# Patient Record
Sex: Female | Born: 1940 | Race: White | Hispanic: No | Smoking: Never smoker
Health system: Southern US, Community
[De-identification: ages and names within clinical notes are randomized; demographics above are authoritative.]

## PROBLEM LIST (undated history)

## (undated) ENCOUNTER — Emergency Department (HOSPITAL_COMMUNITY): Admission: EM | Source: Home / Self Care

## (undated) DIAGNOSIS — E785 Hyperlipidemia, unspecified: Secondary | ICD-10-CM

## (undated) DIAGNOSIS — G8929 Other chronic pain: Secondary | ICD-10-CM

## (undated) DIAGNOSIS — R519 Headache, unspecified: Secondary | ICD-10-CM

## (undated) DIAGNOSIS — Z9889 Other specified postprocedural states: Secondary | ICD-10-CM

## (undated) DIAGNOSIS — I2510 Atherosclerotic heart disease of native coronary artery without angina pectoris: Secondary | ICD-10-CM

## (undated) DIAGNOSIS — N390 Urinary tract infection, site not specified: Secondary | ICD-10-CM

## (undated) DIAGNOSIS — F32A Depression, unspecified: Secondary | ICD-10-CM

## (undated) DIAGNOSIS — Z87442 Personal history of urinary calculi: Secondary | ICD-10-CM

## (undated) DIAGNOSIS — M21372 Foot drop, left foot: Secondary | ICD-10-CM

## (undated) DIAGNOSIS — F0390 Unspecified dementia without behavioral disturbance: Secondary | ICD-10-CM

## (undated) DIAGNOSIS — R112 Nausea with vomiting, unspecified: Secondary | ICD-10-CM

## (undated) DIAGNOSIS — I10 Essential (primary) hypertension: Secondary | ICD-10-CM

## (undated) DIAGNOSIS — M549 Dorsalgia, unspecified: Secondary | ICD-10-CM

## (undated) DIAGNOSIS — K219 Gastro-esophageal reflux disease without esophagitis: Secondary | ICD-10-CM

## (undated) DIAGNOSIS — E079 Disorder of thyroid, unspecified: Secondary | ICD-10-CM

## (undated) DIAGNOSIS — R42 Dizziness and giddiness: Secondary | ICD-10-CM

## (undated) DIAGNOSIS — R51 Headache: Secondary | ICD-10-CM

## (undated) DIAGNOSIS — M1990 Unspecified osteoarthritis, unspecified site: Secondary | ICD-10-CM

## (undated) DIAGNOSIS — M4800 Spinal stenosis, site unspecified: Secondary | ICD-10-CM

## (undated) DIAGNOSIS — F329 Major depressive disorder, single episode, unspecified: Secondary | ICD-10-CM

## (undated) DIAGNOSIS — N189 Chronic kidney disease, unspecified: Secondary | ICD-10-CM

## (undated) HISTORY — DX: Dorsalgia, unspecified: M54.9

## (undated) HISTORY — DX: Depression, unspecified: F32.A

## (undated) HISTORY — PX: CATARACT EXTRACTION: SUR2

## (undated) HISTORY — PX: BACK SURGERY: SHX140

## (undated) HISTORY — DX: Dizziness and giddiness: R42

## (undated) HISTORY — PX: CYSTOSCOPY: SUR368

## (undated) HISTORY — DX: Headache: R51

## (undated) HISTORY — DX: Chronic kidney disease, unspecified: N18.9

## (undated) HISTORY — PX: COLONOSCOPY: SHX174

## (undated) HISTORY — DX: Disorder of thyroid, unspecified: E07.9

## (undated) HISTORY — DX: Atherosclerotic heart disease of native coronary artery without angina pectoris: I25.10

## (undated) HISTORY — DX: Gastro-esophageal reflux disease without esophagitis: K21.9

## (undated) HISTORY — DX: Essential (primary) hypertension: I10

## (undated) HISTORY — PX: LUMBAR LAMINECTOMY/DECOMPRESSION MICRODISCECTOMY: SHX5026

## (undated) HISTORY — DX: Hyperlipidemia, unspecified: E78.5

## (undated) HISTORY — DX: Major depressive disorder, single episode, unspecified: F32.9

## (undated) HISTORY — DX: Spinal stenosis, site unspecified: M48.00

## (undated) HISTORY — DX: Headache, unspecified: R51.9

## (undated) HISTORY — DX: Other chronic pain: G89.29

---

## 1998-10-09 ENCOUNTER — Other Ambulatory Visit: Admission: RE | Admit: 1998-10-09 | Discharge: 1998-10-09 | Payer: Self-pay | Admitting: Obstetrics and Gynecology

## 2000-01-08 ENCOUNTER — Encounter: Admission: RE | Admit: 2000-01-08 | Discharge: 2000-01-08 | Payer: Self-pay | Admitting: Obstetrics and Gynecology

## 2000-01-08 ENCOUNTER — Encounter: Payer: Self-pay | Admitting: Obstetrics and Gynecology

## 2000-02-20 ENCOUNTER — Other Ambulatory Visit: Admission: RE | Admit: 2000-02-20 | Discharge: 2000-02-20 | Payer: Self-pay | Admitting: Obstetrics and Gynecology

## 2000-02-24 ENCOUNTER — Encounter: Admission: RE | Admit: 2000-02-24 | Discharge: 2000-02-24 | Payer: Self-pay | Admitting: Obstetrics and Gynecology

## 2000-02-24 ENCOUNTER — Encounter: Payer: Self-pay | Admitting: Obstetrics and Gynecology

## 2001-05-12 ENCOUNTER — Other Ambulatory Visit: Admission: RE | Admit: 2001-05-12 | Discharge: 2001-05-12 | Payer: Self-pay | Admitting: Obstetrics and Gynecology

## 2001-05-20 ENCOUNTER — Encounter: Admission: RE | Admit: 2001-05-20 | Discharge: 2001-05-20 | Payer: Self-pay | Admitting: Obstetrics and Gynecology

## 2001-05-20 ENCOUNTER — Encounter: Payer: Self-pay | Admitting: Obstetrics and Gynecology

## 2001-06-29 ENCOUNTER — Encounter: Payer: Self-pay | Admitting: Emergency Medicine

## 2001-06-29 ENCOUNTER — Emergency Department (HOSPITAL_COMMUNITY): Admission: EM | Admit: 2001-06-29 | Discharge: 2001-06-29 | Payer: Self-pay | Admitting: Emergency Medicine

## 2002-08-29 ENCOUNTER — Encounter: Admission: RE | Admit: 2002-08-29 | Discharge: 2002-08-29 | Payer: Self-pay | Admitting: Family Medicine

## 2002-08-29 ENCOUNTER — Encounter: Payer: Self-pay | Admitting: Family Medicine

## 2003-09-05 ENCOUNTER — Encounter: Admission: RE | Admit: 2003-09-05 | Discharge: 2003-09-05 | Payer: Self-pay

## 2004-08-19 ENCOUNTER — Emergency Department (HOSPITAL_COMMUNITY): Admission: EM | Admit: 2004-08-19 | Discharge: 2004-08-19 | Payer: Self-pay | Admitting: Emergency Medicine

## 2004-08-22 ENCOUNTER — Encounter: Payer: Self-pay | Admitting: Emergency Medicine

## 2004-08-22 ENCOUNTER — Ambulatory Visit (HOSPITAL_COMMUNITY): Admission: RE | Admit: 2004-08-22 | Discharge: 2004-08-22 | Payer: Self-pay | Admitting: Urology

## 2004-10-28 ENCOUNTER — Encounter: Admission: RE | Admit: 2004-10-28 | Discharge: 2004-10-28 | Payer: Self-pay | Admitting: Family Medicine

## 2005-08-02 ENCOUNTER — Emergency Department (HOSPITAL_COMMUNITY): Admission: EM | Admit: 2005-08-02 | Discharge: 2005-08-02 | Payer: Self-pay | Admitting: Emergency Medicine

## 2005-08-27 ENCOUNTER — Emergency Department (HOSPITAL_COMMUNITY): Admission: EM | Admit: 2005-08-27 | Discharge: 2005-08-27 | Payer: Self-pay | Admitting: Emergency Medicine

## 2005-09-06 ENCOUNTER — Observation Stay (HOSPITAL_COMMUNITY): Admission: EM | Admit: 2005-09-06 | Discharge: 2005-09-06 | Payer: Self-pay | Admitting: Emergency Medicine

## 2005-09-06 ENCOUNTER — Encounter (INDEPENDENT_AMBULATORY_CARE_PROVIDER_SITE_OTHER): Payer: Self-pay | Admitting: Specialist

## 2006-04-01 ENCOUNTER — Encounter: Admission: RE | Admit: 2006-04-01 | Discharge: 2006-04-01 | Payer: Self-pay | Admitting: Family Medicine

## 2006-04-09 ENCOUNTER — Encounter: Admission: RE | Admit: 2006-04-09 | Discharge: 2006-04-09 | Payer: Self-pay | Admitting: Family Medicine

## 2007-05-19 ENCOUNTER — Encounter: Admission: RE | Admit: 2007-05-19 | Discharge: 2007-05-19 | Payer: Self-pay | Admitting: Family Medicine

## 2008-04-05 ENCOUNTER — Ambulatory Visit (HOSPITAL_COMMUNITY): Admission: AD | Admit: 2008-04-05 | Discharge: 2008-04-07 | Payer: Self-pay | Admitting: Specialist

## 2008-04-05 ENCOUNTER — Encounter (INDEPENDENT_AMBULATORY_CARE_PROVIDER_SITE_OTHER): Payer: Self-pay | Admitting: Specialist

## 2008-04-16 ENCOUNTER — Emergency Department (HOSPITAL_COMMUNITY): Admission: EM | Admit: 2008-04-16 | Discharge: 2008-04-16 | Payer: Self-pay | Admitting: Emergency Medicine

## 2009-02-20 ENCOUNTER — Observation Stay (HOSPITAL_COMMUNITY): Admission: EM | Admit: 2009-02-20 | Discharge: 2009-02-23 | Payer: Self-pay | Admitting: Emergency Medicine

## 2009-02-22 ENCOUNTER — Encounter (INDEPENDENT_AMBULATORY_CARE_PROVIDER_SITE_OTHER): Payer: Self-pay | Admitting: Emergency Medicine

## 2009-02-22 HISTORY — PX: CARDIAC CATHETERIZATION: SHX172

## 2010-07-20 ENCOUNTER — Ambulatory Visit: Payer: Self-pay | Admitting: Diagnostic Radiology

## 2010-07-20 ENCOUNTER — Emergency Department (HOSPITAL_BASED_OUTPATIENT_CLINIC_OR_DEPARTMENT_OTHER): Admission: EM | Admit: 2010-07-20 | Discharge: 2010-07-20 | Payer: Self-pay | Admitting: Emergency Medicine

## 2010-07-22 ENCOUNTER — Encounter: Admission: RE | Admit: 2010-07-22 | Discharge: 2010-07-22 | Payer: Self-pay | Admitting: Orthopedic Surgery

## 2010-08-12 ENCOUNTER — Ambulatory Visit: Payer: Self-pay | Admitting: Cardiology

## 2010-10-19 ENCOUNTER — Encounter: Payer: Self-pay | Admitting: Family Medicine

## 2010-11-20 ENCOUNTER — Other Ambulatory Visit (HOSPITAL_COMMUNITY): Payer: Self-pay | Admitting: Orthopedic Surgery

## 2010-11-20 ENCOUNTER — Encounter (HOSPITAL_COMMUNITY)
Admission: RE | Admit: 2010-11-20 | Discharge: 2010-11-20 | Disposition: A | Discharge status: Home / Self Care | Payer: Medicare Other | Source: Ambulatory Visit | Attending: Orthopedic Surgery | Admitting: Orthopedic Surgery

## 2010-11-20 ENCOUNTER — Ambulatory Visit (HOSPITAL_COMMUNITY)
Admission: RE | Admit: 2010-11-20 | Discharge: 2010-11-20 | Disposition: A | Discharge status: Home / Self Care | Payer: Medicare Other | Source: Ambulatory Visit | Attending: Orthopedic Surgery | Admitting: Orthopedic Surgery

## 2010-11-20 DIAGNOSIS — I10 Essential (primary) hypertension: Secondary | ICD-10-CM | POA: Insufficient documentation

## 2010-11-20 DIAGNOSIS — R079 Chest pain, unspecified: Secondary | ICD-10-CM | POA: Insufficient documentation

## 2010-11-20 DIAGNOSIS — J984 Other disorders of lung: Secondary | ICD-10-CM | POA: Insufficient documentation

## 2010-11-20 DIAGNOSIS — R52 Pain, unspecified: Secondary | ICD-10-CM

## 2010-11-20 LAB — BASIC METABOLIC PANEL
CO2: 29 mEq/L (ref 19–32)
Chloride: 106 mEq/L (ref 96–112)
GFR calc Af Amer: >60 mL/min (ref >60)
Glucose, Bld: 105 mg/dL — ABNORMAL HIGH (ref 70–99)
Sodium: 141 mEq/L (ref 135–145)

## 2010-11-20 LAB — CBC
HCT: 38.8 % (ref 36.0–46.0)
Hemoglobin: 13 g/dL (ref 12.0–15.0)
RBC: 4.25 MIL/uL (ref 3.87–5.11)

## 2010-11-20 LAB — SURGICAL PCR SCREEN: MRSA, PCR: NEGATIVE

## 2010-11-21 ENCOUNTER — Other Ambulatory Visit (HOSPITAL_COMMUNITY): Payer: Self-pay | Admitting: Orthopedic Surgery

## 2010-11-21 ENCOUNTER — Ambulatory Visit (HOSPITAL_COMMUNITY)
Admission: RE | Admit: 2010-11-21 | Discharge: 2010-11-21 | Disposition: A | Discharge status: Home / Self Care | Payer: Medicare Other | Source: Ambulatory Visit | Attending: Orthopedic Surgery | Admitting: Orthopedic Surgery

## 2010-11-21 DIAGNOSIS — R911 Solitary pulmonary nodule: Secondary | ICD-10-CM

## 2010-11-21 DIAGNOSIS — R222 Localized swelling, mass and lump, trunk: Secondary | ICD-10-CM | POA: Insufficient documentation

## 2010-11-21 MED ORDER — IOHEXOL 300 MG/ML  SOLN
80.0000 mL | Freq: Once | INTRAMUSCULAR | Status: AC | PRN
  Administered 2010-11-21: 80 mL via INTRAVENOUS

## 2010-11-27 ENCOUNTER — Observation Stay (HOSPITAL_COMMUNITY)
Admission: RE | Admit: 2010-11-27 | Discharge: 2010-11-28 | DRG: 030 | Disposition: A | Discharge status: Home / Self Care | Payer: Medicare Other | Source: Ambulatory Visit | Attending: Orthopedic Surgery | Admitting: Orthopedic Surgery

## 2010-11-27 ENCOUNTER — Ambulatory Visit (HOSPITAL_COMMUNITY): Payer: Medicare Other

## 2010-11-27 DIAGNOSIS — R32 Unspecified urinary incontinence: Secondary | ICD-10-CM | POA: Insufficient documentation

## 2010-11-27 DIAGNOSIS — G8929 Other chronic pain: Principal | ICD-10-CM | POA: Diagnosis present

## 2010-11-27 DIAGNOSIS — M79609 Pain in unspecified limb: Secondary | ICD-10-CM | POA: Diagnosis present

## 2010-11-27 DIAGNOSIS — I10 Essential (primary) hypertension: Secondary | ICD-10-CM | POA: Diagnosis present

## 2010-11-27 DIAGNOSIS — M549 Dorsalgia, unspecified: Secondary | ICD-10-CM | POA: Diagnosis present

## 2010-11-27 DIAGNOSIS — Z7982 Long term (current) use of aspirin: Secondary | ICD-10-CM | POA: Insufficient documentation

## 2010-11-27 DIAGNOSIS — Z79899 Other long term (current) drug therapy: Secondary | ICD-10-CM | POA: Insufficient documentation

## 2010-12-01 NOTE — Op Note (Signed)
NAMEJAICE, LAGUE               ACCOUNT NO.:  [id number]  MEDICAL RECORD NO.:  [id number]           PATIENT TYPE:  I  LOCATION:  5009                         FACILITY:  [facility]  PHYSICIAN:  Alvy Beal, MD    DATE OF BIRTH:  June 14, 1941  DATE OF PROCEDURE:  11/27/2010 DATE OF DISCHARGE:                              OPERATIVE REPORT   SURGEON:  Alvy Beal, MD  PREOPERATIVE DIAGNOSIS:  Chronic back pain (failed back syndrome).  POSTOPERATIVE DIAGNOSIS:  Chronic back pain (failed back syndrome).  OPERATIVE PROCEDURE:  Dorsal column stimulator placement.  COMPLICATIONS:  None.  INSTRUMENTATION USED:  St. Jude 16-channel rechargeable tri-pole lead with a Copywriter, advertising.  CONDITION:  Stable.  HISTORY:  This is a very pleasant 70 year old woman who has had chronic debilitated back, buttock and left leg pain, but the patient finally undergone a spinal cord stimulator trial and did quite well.  Because of successive trial, she was referred to me for permanent spinal cord stimulator placement.  All appropriate risks, benefits and alternative of surgery were discussed with the patient.  Consent was obtained.  OPERATIVE NOTE:  The patient was brought to the operating room and placed in supine on the operating table.  After successful induction of general anesthesia and endotracheal intubation, TED and SCDs were applied and a time-out was done.  We confirmed patient, procedure, affect of extremity, and all other pertinent important data.  Once this was completed, I used a lateral fluoroscopy and counted up from L5 until I was at the T10 lamina.  An incision was made starting at the superior aspect of T10 and proceeding down to the inferior aspect of T11.  Sharp dissection was carried out down to the deep fascia and I striped the paraspinal muscles to expose the posterior spinous process of T10.  I then reconfirmed that this was a T10 spinous process and then using  a double-action Leksell rongeur, removed the bulk of the spinous process. I then used a 3-0 microcurette to develop the plane underneath the lamina.  I then used a 2 and 3-mm Kerrison to effect a generous laminotomy.  I then developed the plane underneath the lamina and ligamentum flavum.  I removed the ligamentum flavum to expose the underlying thecal sac.  I then passed the dural spatula along the spinal canal without resistance.  I took the 16-lead tri-pole stimulator and placed it along the dorsal column.  I did place this mostly on the left side since she had left leg pain and it spanned from the T8-9 disk space to the T9-10 disk space as this was the programmable area during the trial.  With the lead properly positioned, I then made two bone holes in T11 spinous process, placed a FiberWire through the bone holes and then sutured the leads down to the T11 spinous process.  I then dissected into the T11-12 interspinous process, interspinous ligament and then looped the leads through this and sutured it back with a #1 Vicryl suture.  I then made a second incision on the right buttocks, dissect it and created a pocket 2.5 cm deep.  I then passed the leads using a submuscular passing device from the thoracic to the right gluteal wound. With the thoracic leads properly positioned, I then irrigated the thoracic wound copiously with normal saline, closed in layered fashion with interrupted #1 Vicryl sutures, 2-0 Vicryl sutures and a 3-0 Monocryl.  I then attached the leads to the battery and tested the battery which was functioning perfectly.  I then coiled the excess wire on the bottom side of the battery and placed it into the cavity.  I sutured it down with a #1 Vicryl suture and then irrigated the wound copiously with normal saline.  I retested the battery now that was in the body and again it continued to function.  I then closed the deep fascia with interrupted 2-0 Vicryl sutures  and then a 3-0 Monocryl for the skin.  Steri-Strips and dry dressing applied to both wounds.  The patient was ultimately extubated and transferred to PACU without incident.  At the end of the case, all needle and sponge counts were correct.     Alvy Beal, MD     DDB/MEDQ  D:  11/27/2010  T:  11/28/2010  Job:  [id number]  Electronically Signed by Venita Lick MD on 12/01/2010 05:21:21 PM

## 2010-12-22 NOTE — Discharge Summary (Signed)
  NAMETEPHANIE, [name]               ACCOUNT NO.:  [id number]  MEDICAL RECORD NO.:  [id number]           PATIENT TYPE:  I  LOCATION:  5009                         FACILITY:  [facility]  PHYSICIAN:  Alvy Beal, MD    DATE OF BIRTH:  1941-08-20  DATE OF ADMISSION:  11/27/2010 DATE OF DISCHARGE:  11/28/2010                              DISCHARGE SUMMARY   ADMITTING DIAGNOSIS:  Chronic back pain.  DISCHARGE DIAGNOSIS:  Chronic back pain.  HISTORY:  This is a very pleasant 71 year old woman who has had chronic debilitating back pain.  The patient had multiple attempts at conservative management and she had failed to improve.  Ultimately, she had a trial of spinal cord stimulator placed and did quite well.  As a result, she was referred to me for permanent plantation.  Please refer to the enclosed H and P office notes that are included for further specifics on her past medical, surgical, family, social history.  On the day of admission, the patient was taken to the operating room for a spinal cord stimulator placed.  The patient did well.  There were no adverse intraoperative events.  Postoperative day 1, she was noted to be neurovascularly intact.  The stimulator was activated and was providing adequate coverage.  The incision was clean, dry, and intact.  She was voiding spontaneously, and her pain was well controlled.  She will be discharged to home with appropriate instructions and follow up.     Alvy Beal, MD     DDB/MEDQ  D:  12/11/2010  T:  12/12/2010  Job:  [id number]  Electronically Signed by Venita Lick MD on 12/22/2010 01:58:18 PM

## 2011-01-06 LAB — URINALYSIS, ROUTINE W REFLEX MICROSCOPIC
Hgb urine dipstick: NEGATIVE
Nitrite: NEGATIVE
Specific Gravity, Urine: 1.029 (ref 1.005–1.030)
Urobilinogen, UA: 1 mg/dL (ref 0.0–1.0)
pH: 5.5 (ref 5.0–8.0)

## 2011-01-06 LAB — HEMOGLOBIN A1C
Hgb A1c MFr Bld: 5.7 % (ref 4.6–6.1)
Mean Plasma Glucose: 117 mg/dL

## 2011-01-06 LAB — CBC
HCT: 35.9 % — ABNORMAL LOW (ref 36.0–46.0)
HCT: 36.5 % (ref 36.0–46.0)
Hemoglobin: 12 g/dL (ref 12.0–15.0)
Hemoglobin: 12.4 g/dL (ref 12.0–15.0)
Hemoglobin: 12.5 g/dL (ref 12.0–15.0)
MCV: 92.4 fL (ref 78.0–100.0)
Platelets: 192 10*3/uL (ref 150–400)
RBC: 3.78 MIL/uL — ABNORMAL LOW (ref 3.87–5.11)
RBC: 3.94 MIL/uL (ref 3.87–5.11)
WBC: 5.5 10*3/uL (ref 4.0–10.5)
WBC: 6 10*3/uL (ref 4.0–10.5)

## 2011-01-06 LAB — COMPREHENSIVE METABOLIC PANEL
Alkaline Phosphatase: 90 U/L (ref 39–117)
BUN: 15 mg/dL (ref 6–23)
Chloride: 108 mEq/L (ref 96–112)
GFR calc non Af Amer: >60 mL/min (ref >60)
Glucose, Bld: 96 mg/dL (ref 70–99)
Potassium: 4.2 mEq/L (ref 3.5–5.1)
Total Bilirubin: 0.6 mg/dL (ref 0.3–1.2)

## 2011-01-06 LAB — POCT CARDIAC MARKERS
CKMB, poc: 4.2 ng/mL (ref 1.0–8.0)
CKMB, poc: 6.2 ng/mL (ref 1.0–8.0)
Troponin i, poc: <0.05 ng/mL (ref 0.00–0.09)

## 2011-01-06 LAB — URINE MICROSCOPIC-ADD ON

## 2011-01-06 LAB — CARDIAC PANEL(CRET KIN+CKTOT+MB+TROPI): Relative Index: 3.2 — ABNORMAL HIGH (ref 0.0–2.5)

## 2011-01-06 LAB — CK TOTAL AND CKMB (NOT AT ARMC)
Relative Index: 3.4 — ABNORMAL HIGH (ref 0.0–2.5)
Total CK: 182 U/L — ABNORMAL HIGH (ref 7–177)

## 2011-01-06 LAB — PROTIME-INR: INR: 1.1 (ref 0.00–1.49)

## 2011-01-06 LAB — DIFFERENTIAL
Eosinophils Absolute: 0.2 10*3/uL (ref 0.0–0.7)
Eosinophils Relative: 4 % (ref 0–5)
Lymphocytes Relative: 30 % (ref 12–46)
Lymphs Abs: 1.7 10*3/uL (ref 0.7–4.0)
Monocytes Absolute: 0.4 10*3/uL (ref 0.1–1.0)
Monocytes Relative: 7 % (ref 3–12)

## 2011-01-06 LAB — HEPARIN LEVEL (UNFRACTIONATED): Heparin Unfractionated: 0.83 IU/mL — ABNORMAL HIGH (ref 0.30–0.70)

## 2011-01-06 LAB — D-DIMER, QUANTITATIVE: D-Dimer, Quant: 0.33 ug/mL-FEU (ref 0.00–0.48)

## 2011-02-10 NOTE — Discharge Summary (Signed)
[name], [name]               ACCOUNT NO.:  [id number]   MEDICAL RECORD NO.:  [id number]          PATIENT TYPE:  OBV   LOCATION:  5503                         FACILITY:  [facility]   PHYSICIAN:  Monte Fantasia, MD  DATE OF BIRTH:  [birth year]   DATE OF ADMISSION:  02/20/2009  DATE OF DISCHARGE:  02/23/2009                               DISCHARGE SUMMARY   PRIMARY CARE PHYSICIAN:  Follows up in [facility].   CARDIOLOGIST:  Peter M. Swaziland, MD   DISCHARGE DIAGNOSIS:  1. Chest pain.  2. Unstable angina.  3. Nonobstructive coronary artery disease.  4. Hypertension.  5. Sinus bradycardia.  6. Hyperlipidemia.  7. Depression.   DISCHARGE MEDICATIONS:  1. Aspirin 325 mg p.o. daily.  2. Imdur 60 mg p.o. daily.  3. Protonix 40 mg p.o. q.12.  4. Zoloft 25 mg p.o. daily.  5. Zocor 80 mg p.o. daily.   COURSE DURING THE HOSPITAL STAY:  [name] is a 70 year old  pleasant Caucasian lady.  The patient was admitted on Feb 20, 2009, for  complaints of chest pain.  The patient stated that the pain was left  precordial in nature radiating to her neck and lasting for almost 30  minutes.  The patient had high risk factors with hypertension and  dyslipidemia and hence the patient was admitted to the telemetry unit  and cardiac enzymes were cycled x3.  The chest pain did relieve the  nitroglycerin and the patient also had a cardiology evaluation with Dr.  Swaziland.  In view of her typical chest pain consistent with unstable  angina, the patient had a cardiac cath on Feb 22, 2009.  As per the  cardiac cath report, nonobstructive atherosclerotic coronary artery  disease, superimposed vasospasm of the mid to distal LAD, and normal  left ventricular systolic function.  The patient as per the cardiology  recommendations is medically stable to be discharged and will be  discharged on aspirin and Imdur and statin for the same.  Beta-blockers  were not prescribed on discharge  due to sinus bradycardia.  The patient  is recommended to follow up with Dr. Swaziland as an outpatient in next 1-2  weeks.   RADIOLOGICAL INVESTIGATIONS DONE DURING THE STAY IN THE HOSPITAL:  1. Chest x-ray done on Feb 20, 2009.  Impression:  No acute      cardiopulmonary disease.  2.  Ultrasound done on Feb 21, 2009.      Impression:  Small left renal cyst, incomplete visualization of the      pancreas, and inadequate visualization of the IVC and the aorta due      to bowel gas.  No acute upper abdominal abnormalities.   LABORATORIES ON DISCHARGE:  Total WBC 7.5, hemoglobin 12.0, hematocrit  35.1, platelets of 175.  Sodium 142, potassium 4.2, chloride 108, bicarb  28, glucose 96, BUN 15, creatinine 0.72, total bilirubin 0.6, alkaline  phosphatase 90, AST 20, ALT 17, total protein 6.2, albumin 3.7, calcium  of 9.0.  Cardiac enzymes, total CK 182, CK-MB 6.1, and troponin of 0.02.  Second set, total CKs 142,  CK-MB 4.6, troponin 0.01.  Third set, total  CK of 115, CK-MB of 3.9, and troponin of 0.01.  TSH of 8.470 and UA was  negative.   PROCEDURES DONE DURING THE STAY IN THE HOSPITAL:  1. Cardiac cath done on Feb 22, 2009, final interpretation,      nonobstructive atherosclerotic coronary artery disease,      superimposed vasospasm of mid to distal LAD, normal left      ventricular function.   DISPOSITION:  The patient at present is medically stable to be  discharged and can be discharged home.  The patient is recommended to  follow up with her primary care physician in [facility]  and Dr. Peter Swaziland as an outpatient.   Total time for discharge of 40 minutes.      Monte Fantasia, MD  Electronically Signed     MP/MEDQ  D:  02/23/2009  T:  02/24/2009  Job:  [id number]

## 2011-02-10 NOTE — Op Note (Signed)
Jocelyn, [name]               ACCOUNT NO.:  [id number]   MEDICAL RECORD NO.:  [id number]          PATIENT TYPE:  AMB   LOCATION:  DAY                          FACILITY:  [facility]   PHYSICIAN:  Jene Every, M.D.    DATE OF BIRTH:  Jul 23, 1941   DATE OF PROCEDURE:  04/05/2008  DATE OF DISCHARGE:                               OPERATIVE REPORT   PREOPERATIVE DIAGNOSES:  Spinal stenosis and herniated nucleus pulposus  at 4-5 and 5-1 with a history of lumbar decompression of L5-1.   PROCEDURE PERFORMED:  1. Central decompression at L4-5, bilateral hemilaminotomies and      lateral recess decompression.  2. Left hemilaminectomy of L5.  3. Foraminotomy of L5.  4. Microdiskectomy of L4-5 with retrieval of extruded fragment at L4-      5.  5. Application of Duragen and a dural rent.  6. Redo decompression at L5-1.  The degree of difficulty was enhanced      due to the previous surgery with extensive epidural fibrosis with      the need to perform a hemilaminectomy to mobilize the L5 root and      retrieve the fragment.   ANESTHESIA:  General.   ASSISTANT:  Roma Schanz, P.A.   SPECIMEN:  Disk.   BRIEF HISTORY AND INDICATIONS:  This is a 70 year old with a foot drop  on the left with a large extruded fragment at L4-5 compressing the L5  root and extending into the L5 foramen.  She had a previous history of a  decompression at the L5-1 with epidural fibrosis noted.  She is  indicated for decompression by disk retrieval and decompression of L4-5.  We felt that due to the fairly significant spinal stenosis essentially  at L4-5 prudent to perform a central decompression to allow mobilization  of L5 root.  The risks and benefits were discussed, including bleeding,  infection, damage to surrounding structures, CSF leakage, epidural  fibrosis, adjacent segment disease, and need for fusion in the future,  anesthetic complications, etc.   TECHNIQUE:  With the patient in supine  position after induction of  adequate anesthesia and 1 mg of Kefzol, she was placed prone on the  [place] frame.  All bony prominences were well-padded.  The lumbar  region was prepped and draped in the usual sterile fashion.  Two  __________  spinal needle was utilized to localize the L4-5 interspace  which was confirmed with x-ray.   An incision was made from the spinous process of L4-S1.  The  subcutaneous tissue was dissected.  Electrocautery was utilized to  achieve hemostasis.  The dorsolumbar fascia was identified.  The  paraspinous muscle was elevated from the lamina of L4-5.  The Kochers  were placed, confirming the interspace.  Central decompression of L4-5  was performed with a Leksell rongeur, removing the spinous processes  partially of L4 and L5.  We decompressed cephalad bilaterally,  undercutting the L4 lamina with a 2 mm Kerrison and out to the medial  border of the pedicles bilaterally.  We used an Administrator.  There was significant central  stenosis multifactorial, left greater than  right.  We decompressed the left side to the medial border of pedicle,  undercutting the facet.  There was significant lateral recessed stenosis  and tethering of the L5 root.  Distally, we removed the hemilamina of L5  in an attempt to mobilize the L5 root as the extruded fragment had  extruded caudad and out into the L5 foramen into the L5-1 interspace.  The thecal sac dorsally, however, was intimately adherent to the  epidural fibrosis.  After attempt of mobilization with a microcurette,  Penfield 4, it was felt that any further mobilization would produce a  dural rent.  Just distal to the L5 root where adhesion of the sac to the  lateral recess was noted, there appeared to be a small, perhaps 1/2 mm,  of rent with no active CSF leakage, however, through that.   I felt that we had to address the free fragment cephalad of the nerve  root.  With the neural elements well  protected and the nerve root  identified, an ample foraminotomy of L5 was performed by protecting the  nerve root and gently coaxed what appeared to be the free fragment from  just beneath the medial border of the pedicle and out into the foramen.  This was meticulously coaxed with a nerve hook.  A small end was  identified and retrieved with a micro pituitary gently pulling the  fragment from the foramen of L5.  Fortunately, we were able to mobilize  this and the free fragment appeared to be extracted in one large piece.  Following that, there was significant decompression with mobilization of  L5 root and thecal sac possible.  We extended up to the disk space.  There was a portion of the disk herniation here and, therefore, I  entered the disk space with a pituitary and a straight micro pituitary.  Multiple passes removed some additional disk.  There was no residual  disk herniation amenable to diskectomy and there was good excursion of  L4 and L5 root.  The hockey-stick probe passed freely up the foramen of  L4-5.  There was no residual fragment noted at the L5 foramen beneath  the thecal sac and root L4 or L5.   The disk space area was copiously with antibiotic irrigation.  No CSF  leakage or active bleeding.  Down in the area of attenuation at L5-1  potential small rent, we placed Duragen over that and a thrombin patch.  We obtained a radiograph to confirm the L5 foramen and removed with a  McCullough retractor.  Paraspinous muscles were irrigated and inspected.  No active bleeding or CSF leakage.   The dorsolumbar fascia was reapproximated with #1 Vicryl interrupted  figure-of-eight sutures.  The subcutaneous tissue was reapproximated  with 2-0 Vicryl and the skin was reapproximated with staples and dressed  sterilely.   We placed her supine on a hospital bed, extubated without difficulty and  transported to recovery in satisfactory condition.   The patient tolerated the  procedure well with no complications.   ESTIMATED BLOOD LOSS:  100 mL      Jene Every, M.D.  Electronically Signed     JB/MEDQ  D:  04/05/2008  T:  04/05/2008  Job:  [id number]

## 2011-02-10 NOTE — Consult Note (Signed)
[name], [name]               ACCOUNT NO.:  [id number]   MEDICAL RECORD NO.:  [id number]          PATIENT TYPE:  OBV   LOCATION:  5503                         FACILITY:  [facility]   PHYSICIAN:  Peter M. Swaziland, M.D.  DATE OF BIRTH:  [birth year]   DATE OF CONSULTATION:  DATE OF DISCHARGE:                                 CONSULTATION   HISTORY OF PRESENT ILLNESS:  [name] is a pleasant 70 year old white  female who was admitted yesterday evening for evaluation of chest pain.  She has been in generally good health.  She had her first episode of  chest pain yesterday morning.  It was described as a mid substernal  chest pain of moderate intensity.  It did not radiate.  She had no  associated shortness of breath, diaphoresis, nausea, or vomiting.  She  had no abdominal pain.  Later yesterday evening after returning from  work, she developed recurrent acute substernal chest pain of more severe  intensity.  This time it radiated up into her throat and into her jaw.  She again denied shortness of breath or diaphoresis.  She presented to  the emergency room and did report getting relief with 3 sublingual  nitroglycerin.  Her more severe pain lasted approximately 30 minutes,  but she continued to have a dull pain until she received a  nitroglycerin.  Since her hospital stay, she has had some recurrence of  mild substernal chest pain.  The patient does have a history of acid  reflux but has been on chronic omeprazole therapy.  She denies any  abdominal pain.  She has multiple cardiac risk factors including history  of hypertension, hypercholesterolemia, and very strong family history of  coronary disease.  In fact, her twin sister has had a history of  coronary stenting.   PAST MEDICAL HISTORY:  1. Hypertension.  2. Hyperlipidemia.  3. Nephrolithiasis.  4. History of spinal stenosis status post back surgery.  She has a      persistent left footdrop.  5. Chronic back pain.  6.  Depression.  7. Hyperlipidemia.  8. She has also had previous cystoscopy with removal of a renal stone.   Her medications prior to admission include:  1. Lovastatin 80 mg per day.  2. Doxazosin 8 mg per day.  3. Sertraline 150 mg daily.  4. Aspirin 81 mg per day.  5. Omeprazole 20 mg per day.  6. Calcium 1200 mg daily.  7. Vitamin D with calcium daily.  8. Gabapentin 300 mg p.r.n.  9. Hydrocodone p.r.n.   She has no known allergies.   SOCIAL HISTORY:  The patient is married.  She lives with her family.  She denies any history of alcohol or tobacco use.   Her family history is strongly positive for coronary disease.  She has  had 5 siblings including a twin sister who have had coronary disease.   REVIEW OF SYSTEMS:  The patient denies any edema, orthopnea, or PND.  She has had no history of bleeding disorder.  She has had no history of  TIA or stroke.  Bowel and  bladder habits have been normal.  She has had  no prior cardiac evaluation.  All other systems were reviewed and are  negative.   PHYSICAL EXAMINATION:  GENERAL:  The patient is a pleasant white female  in no apparent distress.  VITAL SIGNS:  Blood pressure is 128/70, pulse is 69 and regular.  She is  afebrile.  Sats are 94% on room air.  HEENT:  She is normocephalic, atraumatic.  Pupils equal, round, reactive  to light and accommodation.  Extraocular movements are full.  Oropharynx  is clear.  NECK:  Supple without JVD, adenopathy, thyromegaly, or bruits.  LUNGS:  Clear.  CARDIAC:  A regular rate and rhythm.  Normal S1 and S2 without gallop,  murmur, rub, or click.  ABDOMEN:  Soft.  There is no tenderness or guarding.  Bowel sounds are  positive.  She has no masses or hepatosplenomegaly.  EXTREMITIES:  Without edema.  Pulses were 2+ and symmetric.  NEUROLOGIC:  She is alert and oriented x4.  Cranial nerves II through  XII are intact.  She has a footdrop on the left.   LABORATORY DATA:  Chest x-ray shows no active  disease.  ECG shows a  leftward axis, otherwise no acute ST-T-wave changes.  White count is  5500, hemoglobin 12.4, hematocrit 36.5, platelets 192,000.  Point-of-  care cardiac enzymes were negative x2.  D-dimer was 0.33.  Urinalysis  was negative.  Sodium is 142, potassium 4.2, chloride 108, CO2 28,  glucose of 96, BUN 15, creatinine 0.72.  TSH was 8.47.  A1c was 5.7.  Initial CPK was elevated at 182 with 6.1 MB and then declined to 142  with 4.6 MB, 115 with 3.9 MB.  Troponins were 0.02, 0.01, and 0.01.   IMPRESSION:  1. Acute chest pain.  Based on her history, I think this is consistent      with unstable angina.  She had an abdominal ultrasound today which      was negative for gallstone disease.  She has been on therapy for      acid reflux but had symptoms despite this.  She has multiple      cardiac risk factors.  Her initial CK was suggestive of coronary      event.  2. Hypertension.  3. Hypercholesterolemia.  4. Strong family history of coronary disease.  5. History of spinal stenosis status post lumbar decompression with      chronic footdrop.   PLAN:  Based on her clinical features of multiple risk factors, I have  recommended definitive cardiac evaluation with cardiac catheterization.  While stress test may be useful, I think this is likely to give [place] a  definitive diagnosis.  The patient also is not able to walk adequately  on treadmill due to her footdrop and would have to have a  pharmacologic stress test.  I have recommended cardiac catheterization,  and we will plan to proceed with this tomorrow afternoon.  We will  initiate therapy with IV heparin.  We will continue on her nitrates.  We  would recommend low dose of beta-blocker, observe her for bradycardia.  We will continue with her statin therapy.           ______________________________  Peter M. Swaziland, M.D.     PMJ/MEDQ  D:  02/21/2009  T:  02/22/2009  Job:  [id number]   cc:   Monte Fantasia, MD   [facility].

## 2011-02-10 NOTE — Cardiovascular Report (Signed)
[name], [name]               ACCOUNT NO.:  [id number]   MEDICAL RECORD NO.:  [id number]          PATIENT TYPE:  OBV   LOCATION:  5503                         FACILITY:  [facility]   PHYSICIAN:  Peter M. Swaziland, M.D.  DATE OF BIRTH:  May 18, 1941   DATE OF PROCEDURE:  02/22/2009  DATE OF DISCHARGE:                            CARDIAC CATHETERIZATION   INDICATION FOR PROCEDURE:  A 70 year old white female with history of  hypertension, hyperlipidemia, and strong family history of coronary  disease presents with symptoms of new onset chest pain consistent with  unstable angina.   PROCEDURE:  Left heart catheterization and left ventricular angiography.   EQUIPMENT USED:  6-French 4 cm right and left Judkins catheter, 6-French  pigtail catheter, and 6-French arterial sheath.   MEDICATIONS:  Local anesthesia 1% Xylocaine, Versed 2 mg IV, and  fentanyl 25 mcg IV.   CONTRAST:  100 mL of Omnipaque.   HEMODYNAMIC DATA:  Aortic pressure 143/73 with a mean of 100 mmHg, left  ventricular pressure 140 with EDP of 14 mmHg.   ANGIOGRAPHIC DATA:  The left coronary artery arises and distributes  normally.  The left main coronary artery appears normal.   The left anterior descending artery has diffuse 30% narrowing at the  midvessel.  The mid to distal LAD is very small in caliber.  Distal flow  was somewhat sluggish.  After intracoronary nitroglycerin, there did  appear to be improved vessel caliber and distal flow suggesting  component of vasospasm.  The first diagonal branches without significant  disease.   Left circumflex coronary artery arises and distributes normally.  It  gives rise to a single large marginal vessel.  This marginal branch then  bifurcates.  There is 20% narrowing in the proximal left circumflex.  There is 30% narrowing at the origin of this obtuse marginal vessel.   The right coronary artery arises and distributes normally.  It is a  dominant vessel.  It has no  significant atherosclerosis.   Left ventricular angiography was performed in the RAO view.  This  demonstrates normal left ventricular size, contractility with normal  systolic function.  Ejection fraction was estimated at 60%.  No mitral  regurgitation or prolapse.   FINAL INTERPRETATION:  1. Nonobstructive atherosclerotic coronary artery disease.  2. 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  3. Normal left ventricular function.   PLAN:  Would recommend medical therapy with mononitrates, aspirin and  statin therapy.           ______________________________  Peter M. Swaziland, M.D.     PMJ/MEDQ  D:  02/22/2009  T:  02/23/2009  Job:  [id number]   cc:   [facility]

## 2011-02-10 NOTE — H&P (Signed)
NAMEDESTANY, SEVERNS               ACCOUNT NO.:  [id number]   MEDICAL RECORD NO.:  [id number]          PATIENT TYPE:  INP   LOCATION:  5501                         FACILITY:  [facility]   PHYSICIAN:  Oswald Hillock, MD        DATE OF BIRTH:  May 01, 1941   DATE OF ADMISSION:  02/20/2009  DATE OF DISCHARGE:                              HISTORY & PHYSICAL   CHIEF COMPLAINT:  Chest pain.   HISTORY OF PRESENT ILLNESS:  The patient is a 70 year old Caucasian  female with history of hypertension and hyperlipidemia who presents to  the emergency room with complaints of chest pain that initially started  this morning, lasted about 5 minutes most localized to the midsternal  area with no radiation or referral, not associated with any shortness of  breath, palpitations, dizziness, diaphoresis, loss of consciousness, or  any focal weakness of any part of the body.  The patient had recurrence  of the episode after she came back from work tonight and this time the  episode lasted about 30 minutes and the patient decided to come to the  emergency room for evaluation.  She does give history of increasing pain  with deep breathing.  Denies any cough, fever, rigors, chills, or any  other associated symptoms.  The patient has not had symptoms like this  in the past and states that her hypertension and hyperlipidemia are very  well controlled.  She has had intermittent right upper quadrant  discomfort, has been taking Prilosec and ranitidine by herself, but this  pain is different per her account.  The patient does have history of  spinal stenosis and has had lumbar decompression done in July 2009.   PAST MEDICAL HISTORY:  Significant for,  1. Hypertension.  2. Hyperlipidemia.  3. Nephrolithiasis.  4. 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  5. Chronic back pain.  6. Depression.  7. Hyperlipidemia.   PAST SURGICAL HISTORY:  1. History of cystoscopy with removal of ureteral  calculus.  2. History of lumbar decompression surgery in 2009.   CURRENT MEDICATIONS:  1. Cardura.  2. Aspirin.  3. Vicodin  4. Lovastatin.  5. Prilosec  6. Zoloft.   ALLERGIES:  No known drug allergies.   SOCIAL HISTORY:  The patient lives with her family.  Denies any alcohol,  tobacco, or drug use.   FAMILY HISTORY:  No history of hypertension, coronary artery disease, or  diabetes mellitus in the family.   REVIEW OF SYSTEMS:  An extensive review of systems and all systems are  negative except for the positives as mentioned in the history of present  illness.   PHYSICAL EXAMINATION:  VITALS:  On admission, pulse of 62, blood  pressure 148/64, respiratory rate of 18, temperature 97.9, O2 sats were  93% on room air and 99% on 2 L nasal cannula.  GENERAL:  The patient is conscious, alert, oriented to time, place, and  person, still having some residual chest discomfort.  HEENT:  No scleral icterus.  No pallor.  Ears negative.  No significant  oropharyngeal lesions.  NECK:  Supple.  No lymphadenopathy.  No JVD.  No carotid bruits.  CHEST:  CTA bilaterally.  CVS: S1 and S2 plus regular.  No gallop or rub or murmur appreciated.  ABDOMEN:  Soft.  There is some deep tenderness in the right upper  quadrant; however, no guarding, no rebound.  Bowel sounds present in all  quadrants.  EXTREMITIES:  No cyanosis, clubbing, or edema.  The patient has a brace  in her left lower extremity.  No swelling or other abnormalities noted.  NEUROLOGIC:  Cranial nerves II through XII are grossly intact.  The  patient moves all 4 extremities.   LABORATORY DATA:  WBC count 5.5, hemoglobin 12.4, hematocrit 36.5,  platelet count of 192.  Her D-dimer is 0.33, CK 108, CK-MB 6.2, troponin  0.02.  Her EKG showed sinus bradycardia, rate of about 61, normal PR  interval, normal QRS duration.  Poor R-wave progression in the anterior  leads.  No new changes compared with EKG done about a year back.    CHEST X-RAY:  No acute cardiopulmonary disease.   IMPRESSION AND PLAN:  This is a case of a 70 year old Caucasian female  with a known history of hypertension and hyperlipidemia who presents  with chest pain.  1. Chest pain.  Based on her multiple risk factors including age,      hypertension, and hyperlipidemia, we will admit her to telemetry      for overnight observation, recycle enzymes, continue her on      NitroPaste, no need for any beta-blockade as her heart rate is in      the 60s.  We will consult Cardiology in the morning.  The patient      will need a possible stress test prior to discharge.  2. Hypertension, controlled.  We will hold her current medication,      i.e., Cardura as her dosage is unknown and blood pressures are      controlled at this time and monitor closely.  3. Hyperlipidemia.  Check fasting lipid panel.  LFTs are within normal      limits.  Continue lovastatin.  4. Chronic back pain.  We will use Vicodin on a p.r.n. basis.  5. Deep vein thrombosis/gastrointestinal prophylaxis.  Lovenox and      Protonix.  6. Right upper quadrant abdominal pain.  The patient has had      infrequent right upper quadrant abdominal pain and there is some      tenderness on examination at this time.  We will get upper      abdominal ultrasound and follow up with the results.  7. Code status, full code.      Oswald Hillock, MD  Electronically Signed     BA/MEDQ  D:  02/20/2009  T:  02/21/2009  Job:  [id number]

## 2011-02-11 ENCOUNTER — Emergency Department (HOSPITAL_COMMUNITY)
Admission: EM | Admit: 2011-02-11 | Discharge: 2011-02-11 | Disposition: A | Discharge status: Home / Self Care | Payer: Medicare Other | Attending: Emergency Medicine | Admitting: Emergency Medicine

## 2011-02-11 ENCOUNTER — Emergency Department (HOSPITAL_COMMUNITY): Payer: Medicare Other

## 2011-02-11 DIAGNOSIS — R51 Headache: Secondary | ICD-10-CM | POA: Insufficient documentation

## 2011-02-11 DIAGNOSIS — I10 Essential (primary) hypertension: Secondary | ICD-10-CM | POA: Insufficient documentation

## 2011-02-11 DIAGNOSIS — S5000XA Contusion of unspecified elbow, initial encounter: Secondary | ICD-10-CM | POA: Insufficient documentation

## 2011-02-11 DIAGNOSIS — S0990XA Unspecified injury of head, initial encounter: Secondary | ICD-10-CM | POA: Insufficient documentation

## 2011-02-11 DIAGNOSIS — R296 Repeated falls: Secondary | ICD-10-CM | POA: Insufficient documentation

## 2011-02-11 DIAGNOSIS — R5383 Other fatigue: Secondary | ICD-10-CM | POA: Insufficient documentation

## 2011-02-11 DIAGNOSIS — Y92009 Unspecified place in unspecified non-institutional (private) residence as the place of occurrence of the external cause: Secondary | ICD-10-CM | POA: Insufficient documentation

## 2011-02-11 DIAGNOSIS — R5381 Other malaise: Secondary | ICD-10-CM | POA: Insufficient documentation

## 2011-02-11 DIAGNOSIS — E7800 Pure hypercholesterolemia, unspecified: Secondary | ICD-10-CM | POA: Insufficient documentation

## 2011-02-11 DIAGNOSIS — S8000XA Contusion of unspecified knee, initial encounter: Secondary | ICD-10-CM | POA: Insufficient documentation

## 2011-02-11 LAB — POCT I-STAT, CHEM 8
BUN: 18 mg/dL (ref 6–23)
Creatinine, Ser: 0.9 mg/dL (ref 0.4–1.2)
Hemoglobin: 11.9 g/dL — ABNORMAL LOW (ref 12.0–15.0)
Potassium: 3.8 mEq/L (ref 3.5–5.1)
Sodium: 144 mEq/L (ref 135–145)

## 2011-02-11 LAB — CBC
Hemoglobin: 12 g/dL (ref 12.0–15.0)
MCH: 30.7 pg (ref 26.0–34.0)
MCHC: 33.4 g/dL (ref 30.0–36.0)
Platelets: 172 10*3/uL (ref 150–400)

## 2011-02-11 LAB — DIFFERENTIAL
Basophils Absolute: 0 10*3/uL (ref 0.0–0.1)
Basophils Relative: 0 % (ref 0–1)
Eosinophils Absolute: 0.1 10*3/uL (ref 0.0–0.7)
Monocytes Absolute: 0.4 10*3/uL (ref 0.1–1.0)
Neutro Abs: 2.8 10*3/uL (ref 1.7–7.7)
Neutrophils Relative %: 57 % (ref 43–77)

## 2011-02-11 LAB — POCT CARDIAC MARKERS
CKMB, poc: 3.8 ng/mL (ref 1.0–8.0)
Myoglobin, poc: 105 ng/mL (ref 12–200)

## 2011-02-11 LAB — URINALYSIS, ROUTINE W REFLEX MICROSCOPIC
Glucose, UA: NEGATIVE mg/dL
Ketones, ur: NEGATIVE mg/dL
Nitrite: NEGATIVE
Protein, ur: NEGATIVE mg/dL
Urobilinogen, UA: 1 mg/dL (ref 0.0–1.0)

## 2011-02-13 NOTE — Op Note (Signed)
NAMEREYNE, FALCONI               ACCOUNT NO.:  [id number]   MEDICAL RECORD NO.:  [id number]          PATIENT TYPE:  AMB   LOCATION:  DAY                          FACILITY:  [facility]   PHYSICIAN:  Rozanna Boer., M.D.DATE OF BIRTH:  06-Nov-1940   DATE OF PROCEDURE:  08/22/2004  DATE OF DISCHARGE:                                 OPERATIVE REPORT   PREOPERATIVE DIAGNOSIS:  Right distal ureteral stone with obstruction, 6 mm.   POSTOPERATIVE DIAGNOSIS:  Right distal ureteral stone with obstruction, 6  mm.   OPERATION:  Cystoureteroscopy with stone extraction.   ANESTHESIA:  General.   SURGEON:  Courtney Paris, M.D.   BRIEF HISTORY:  This 70 year old patient presented with a 4-day history of  right distal ureteral stone obstruction with 6-mm stone. No previous history  of stone disease. He comes in now for cystoureteroscopy, possible laser  tripsy if necessary.   The patient was placed in the dorsal lithotomy position. After satisfactory  induction of general anesthesia, she was prepped and draped with Betadine  and given a gram of Ancef IV preoperatively. Panendoscope was used to  inspect the bladder which was smooth and nontrabeculated. I could see just a  tiny piece of a stone at the right ureteral opening. I had to use an open  ended ureteral catheter with a glide wire to get into the orifice and past  the stone. Then I could advance the open ended catheter past the stone and  remove the glide wire. I then inserted a guide wire under fluoroscopy up to  the level of the kidney and removed the open ended catheter. Over the guide  wire, I placed a 4-cm ureteral balloon dilator, inflated this to 12  atmospheres for 3 timed minutes. When this was done, I removed that and left  the guide wire as a safety wire and then passed the 6 short ureteroscope  past the stone into the distal right ureter. Using a basket, I was able to  entrap the stone and then removed it intact. I  used the guide wire to pass a  6-French 25-cm length double J ureteral stent, but this inadvertently got  pulled out, and with the cystoscope, I could see that the orifice looked  open, and I could see good clear efflux. I decided not to the leave the  stent, so I drained the bladder, removed the instruments, and took the  patient to the recovery room in good condition where she will be later  discharged as an outpatient. She was given a B&O suppository and some  Toradol IV and was sent home with detailed written instructions. She will  come back to the office in a week and bring her stone for further analysis  and treatment.      HMK/MEDQ  D:  08/22/2004  T:  08/22/2004  Job:  [id number]

## 2011-02-13 NOTE — Consult Note (Signed)
[name], [name]               ACCOUNT NO.:  [id number]   MEDICAL RECORD NO.:  [id number]          PATIENT TYPE:  AMB   LOCATION:  DAY                          FACILITY:  [facility]   PHYSICIAN:  Rozanna Boer., M.D.DATE OF BIRTH:  [birth year]   DATE OF CONSULTATION:  08/22/2004  DATE OF DISCHARGE:                                   CONSULTATION   REASON FOR CONSULTATION:  Obstructing right ureteral calculus.   This 70 year old patient was seen originally at [facility] 4 days  ago at the emergency room for right flank pain.  She had a CT scan which  showed a 6 mm distal right stone.  She was sent home with some pain  medicine, but the pain persisted.  It has been mainly right-sided but  yesterday was also the left side as well.  Dr. Doug Sou asked me to  see her today.  I had her transferred to [facility] for  further evaluation and treatment.  A KUB showed that the 6.6 mm stone was  still present in the region of the right distal ureter.  No other stones  seen.  Bony structures and soft tissue shadows looked normal.   She has not had a history of stones.  She does have a brother with stone  history.  She is taking pain pills, but these have not been effective  recently.  Has not had any fever, nausea, vomiting.  Her medicines include  oxycodone, amoxicillin for an apparent UTI, Zoloft, Lipitor, Cardura, and 81  mg aspirin.   Previous surgery includes back surgery x 3.  The first was in 1989.  The  last was 1998, all for disks.  Also an appendectomy in 1988.   REVIEW OF SYSTEMS:  She does have elevated cholesterol but no history of  heart attack, mild high blood pressure, no breathing problems, nonsmoker, no  GI complaints, no arthritis or joint disease.  Does have some mild low back  pain.   FAMILY HISTORY AND SOCIAL HISTORY:  She is married and has two daughters.  Her husband just had a stroke and just got out of the hospital, and they are  having to care for him as well.  Somewhat stressed over that.  Does have a  family history of stone disease but no kidney disease or prostate cancer or  heart disease.   PHYSICAL EXAMINATION:  VITAL SIGNS:  Her temperature is 98.2, blood pressure  101/72, pulse 75, respirations 18.  GENERAL:  She is a middle-aged white female in no acute distress but does  complain of mild right flank pain lying on her side.  HEENT:  Clear.  NECK:  Supple.  BREASTS:  Not examined.  LUNGS:  Clear.  ABDOMEN:  Soft.  Liver, spleen nonpalpable.  Does have a little mild right  CVA tenderness to __________ percussion.  Does have a right lower quadrant  appendectomy scar and a scar on her back from previous surgery.  PELVIC:  Fairly good anterior support.  Normal uterus.  No pelvic masses.  No tenderness.  EXTREMITIES:  Negative.  No edema.  Good distal pulses, intact sensation to  light touch.   Hemoglobin 11.2.  Renal function tests:  Creatinine 1.4.  Electrolytes  normal.   IMPRESSION:  Right distal ureteral stone, 6 mm with obstruction x 4 days.  Recommend ureteroscopy and stone extraction if possible.      HMK/MEDQ  D:  08/22/2004  T:  08/22/2004  Job:  [id number]

## 2011-02-13 NOTE — Consult Note (Signed)
[name], [name]               ACCOUNT NO.:  [id number]   MEDICAL RECORD NO.:  [id number]          PATIENT TYPE:  OBV   LOCATION:  1409                         FACILITY:  [facility]   PHYSICIAN:  Heloise Purpura, MD      DATE OF BIRTH:  [birth year]   DATE OF CONSULTATION:  09/05/2005  DATE OF DISCHARGE:  09/06/2005                                   CONSULTATION   REASON FOR CONSULTATION:  Left flank pain and left ureteral calculus.   HISTORY OF PRESENT ILLNESS:  Mrs.  [name] is a 70 year old female who is  followed by Dr. Aldean Ast and has a history of stone disease.  She has a  known distal left ureteral calculus and underwent a CT scan approximately 10  days ago which demonstrated a 4-5 mm left UVJ calculus.  Over the course of  the last 24 hours, the patient has experienced increasing pain, not  controlled by her oral pain medications.  In addition, she has had nausea  and vomiting.  She denies fever or chills.  She subsequently presented to  the emergency room for evaluation, and a repeat CT scan was performed which  demonstrated a 6 mm UVJ calculus with left hydroureteronephrosis.  Her pain  was unable to be controlled adequately with oral pain medications due to her  nausea.   PAST MEDICAL HISTORY:  1.  Hypertension.  2.  Hypercholesterolemia.   PAST SURGICAL HISTORY:  1.  Exploratory laparotomy with appendectomy.  2.  Back surgery.   MEDICATIONS:  1.  Cardura.  2.  Zoloft.  3.  Lipitor.   ALLERGIES:  No known drug allergies.   FAMILY HISTORY:  No GU malignancy.   REVIEW OF SYSTEMS:  CARDIOVASCULAR:  No chest pain or palpitations.  PULMONARY:  No cough or shortness of breath.  UPPER GI:  Nausea and  vomiting.  CONSTITUTIONAL:  No fever or chills.  GU:  No dysuria, urgency,  frequency or hematuria.   PHYSICAL EXAMINATION:  GENERAL APPEARANCE:  The patient is currently  afebrile with stable vital signs.  CONSTITUTIONAL:  The patient is alert and oriented in no acute  distress.  HEENT:  Normocephalic, atraumatic.  Oropharynx is clear.  CARDIOVASCULAR:  Regular rate and rhythm without obvious murmurs.  LUNGS:  Clear bilaterally.  ABDOMEN:  Soft and nondistended.  The patient does have significant left  lower quadrant tenderness.  BACK:  Mild to moderate left CVA tenderness.  GU:  Deferred.  EXTREMITIES:  No edema.  NEUROLOGICAL:  Grossly intact.   LABORATORY DATA:  Urinalysis demonstrated 3-6 white blood cells and 0-2 red  blood cells.  Serum creatinine is 1.3, white blood count is 7.3.   IMPRESSION:  Left ureterovesical junction calculus.   PLAN:  The patient will be admitted to the hospital for IV fluid hydration  and IV pain management.  I discussed the options with the patient including  continued conservative management versus urethroscopic stone removal.  The  patient has decided that if she has not passed her stone by morning, that  she would like to proceed with stone extraction.  ______________________________  Heloise Purpura, MD  Electronically Signed     LB/MEDQ  D:  09/06/2005  T:  09/07/2005  Job:  [id number]

## 2011-02-13 NOTE — Op Note (Signed)
NAMEGRAVIELA, [name]               ACCOUNT NO.:  [id number]   MEDICAL RECORD NO.:  [id number]          PATIENT TYPE:  OBV   LOCATION:  1409                         [facility]ACILITY:  [facility]   PHYSICIAN:  Heloise Purpura, MD      DATE OF BIRTH:  18-Mar-1941   DATE OF PROCEDURE:  09/06/2005  DATE OF DISCHARGE:  09/06/2005                                 OPERATIVE REPORT   PREOPERATIVE DIAGNOSES:  Left ureterovesical junction  calculus.   POSTOPERATIVE DIAGNOSES:  Left ureterovesical junction  calculus.   PROCEDURE:  1.  Cystoscopy.  2.  Removal of ureteral calculus.  3.  Left retrograde pyelography.   SURGEON:  Crecencio Mc, M.D.   ANESTHESIA:  General.   COMPLICATIONS:  None.   INDICATIONS FOR PROCEDURE:  [name] is a 70 year old female with a known  distal left ureteral calculus. The patient has been managed conservatively  with observation by Dr. Aldean Ast. Recently, the patient began having  significantly worse flank pain and yesterday had pain that was not  controlled by her oral pain medication. In addition, she developed  significant nausea and was unable to keep herself hydrated. She was  evaluated in the emergency room and subsequently admitted to the hospital  for IV fluid hydration as well as IV pain control. After a discussion  regarding the options including continued observation versus surgical  treatment, the patient elected to proceed with the above procedures. The  procedure risks and benefits of these procedures were discussed with the  patient and she consented.   DESCRIPTION OF PROCEDURE:  The patient was taken to the operating room and a  general anesthetic was administered. The patient was given preoperative  antibiotics, placed in the dorsal lithotomy position, prepped and draped in  the usual sterile fashion and a preoperative time out was performed. Next,  cystourethroscopy was performed with a 12 degree scope. This revealed a  normal urethra and bladder  except for a crowning stone at the left ureteral  orifice. A significant portion of the stone was extending from the ureteral  orifice such that a flexible grasper was able to be placed through the  cystoscope and the stone was able to be easily removed. It was sent for  stone analysis. A 6 French ureteral catheter was then used to intubate the  left ureteral orifice. A subsequent retrograde pyelogram was performed and  demonstrated no further filling defects consistent with residual stone. At  this point,  the patient's bladder was emptied and the procedure was ended. The patient  appeared to tolerate the procedure well and without complications. She was  able to be awakened and transferred to the recovery unit in satisfactory  condition.           ______________________________  Heloise Purpura, MD  Electronically Signed     LB/MEDQ  D:  09/06/2005  T:  09/07/2005  Job:  [id number]   cc:   Courtney Paris, M.D.  Fax: [phone]
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
## 2011-04-02 ENCOUNTER — Encounter: Payer: Self-pay | Admitting: Cardiology

## 2011-04-10 ENCOUNTER — Ambulatory Visit (INDEPENDENT_AMBULATORY_CARE_PROVIDER_SITE_OTHER): Payer: Medicare Other | Admitting: Cardiology

## 2011-04-10 ENCOUNTER — Encounter: Payer: Self-pay | Admitting: Cardiology

## 2011-04-10 DIAGNOSIS — I10 Essential (primary) hypertension: Secondary | ICD-10-CM

## 2011-04-10 DIAGNOSIS — E785 Hyperlipidemia, unspecified: Secondary | ICD-10-CM | POA: Insufficient documentation

## 2011-04-10 DIAGNOSIS — M4800 Spinal stenosis, site unspecified: Secondary | ICD-10-CM

## 2011-04-10 DIAGNOSIS — I2510 Atherosclerotic heart disease of native coronary artery without angina pectoris: Secondary | ICD-10-CM

## 2011-04-10 NOTE — Assessment & Plan Note (Signed)
She is asymptomatic at this point. We'll continue with risk factor modification. I have encouraged her with her efforts at weight loss and would continue with regular aerobic exercise.

## 2011-04-10 NOTE — Assessment & Plan Note (Signed)
I would continue with her current high dose of simvastatin.

## 2011-04-10 NOTE — Patient Instructions (Signed)
Continue current medications.  I will see you again in 1 year.

## 2011-04-10 NOTE — Progress Notes (Signed)
   [name] Date of Birth: 08/10/1941   History of Present Illness: [name] is seen today for followup. [name] reports that [name] has been doing well from a cardiac standpoint. [name] denies any chest pain, shortness of breath, or palpitations. [name] has been trying to walk regularly. [name] has lost 2 pounds since her last visit.  Current Outpatient Prescriptions on File Prior to Visit  Medication Sig Dispense Refill  . aspirin 81 MG tablet Take 81 mg by mouth daily.        . Calcium Carbonate-Vit D-Min (CALCIUM 1200 PO) Take by mouth daily.        Marland Kitchen doxazosin (CARDURA) 8 MG tablet Take 8 mg by mouth at bedtime.        . gabapentin (NEURONTIN) 300 MG capsule Take 300 mg by mouth as needed.        Marland Kitchen HYDROcodone-acetaminophen (NORCO) 10-325 MG per tablet Take 1 tablet by mouth every 6 (six) hours as needed.        . isosorbide mononitrate (IMDUR) 60 MG 24 hr tablet Take 60 mg by mouth daily.        . Multiple Vitamin (MULTIVITAMIN) tablet Take 1 tablet by mouth daily.        Marland Kitchen omeprazole (PRILOSEC) 20 MG capsule Take 20 mg by mouth daily.        . sertraline (ZOLOFT) 100 MG tablet Take 150 mg by mouth daily.        . simvastatin (ZOCOR) 80 MG tablet Take 80 mg by mouth at bedtime.          No Known Allergies  Past Medical History  Diagnosis Date  . Hypertension   . Hyperlipidemia   . Chest pain   . Coronary artery disease     NONOBSTRUCTIVE  . Depression   . Chronic back pain   . Nephrolithiasis   . Spinal stenosis   . GERD (gastroesophageal reflux disease)     Past Surgical History  Procedure Date  . Cardiac catheterization 02/22/2009    EF 60%  . Back surgery   . Cystoscopy   . Lumbar laminectomy/decompression microdiscectomy     History  Smoking status  . Never Smoker   Smokeless tobacco  . Never Used    History  Alcohol Use No    History reviewed. No pertinent family history.  Review of Systems: The review of systems is positive for chronic back pain. [name] was  fitted with a TENS unit in March.  All other systems were reviewed and are negative.  Physical Exam: BP 108/64  Pulse 68  Ht 5\' 4"  (1.626 m)  Wt 169 lb (76.658 kg)  BMI 29.01 kg/m2 [name] is an obese white female in no acute distress. Her HEENT exam is unremarkable. [name] has no JVD or bruits. Lungs are clear. Cardiac exam reveals a regular rate and rhythm without gallop, murmur, or click. Abdomen is soft and nontender without masses or bruits. Pedal pulses are good. [name] has no edema. Neurologic exam is nonfocal. LABORATORY DATA: ECG was reviewed from May and was normal. Complete blood work from February demonstrated normal chemistry panel. Total cholesterol 172 triglycerides 83 HDL 52 and LDL of 103.  Assessment / Plan:

## 2011-04-10 NOTE — Assessment & Plan Note (Signed)
Blood pressure is well controlled on current medications. 

## 2011-06-25 LAB — COMPREHENSIVE METABOLIC PANEL
ALT: 31
AST: 29
Albumin: 3.7
Alkaline Phosphatase: 82
BUN: 13
Chloride: 109
GFR calc Af Amer: >60
Potassium: 4.7
Sodium: 141
Total Bilirubin: 0.6
Total Protein: 6.2

## 2011-06-25 LAB — URINE MICROSCOPIC-ADD ON

## 2011-06-25 LAB — URINALYSIS, ROUTINE W REFLEX MICROSCOPIC
Bilirubin Urine: NEGATIVE
Hgb urine dipstick: NEGATIVE
Nitrite: NEGATIVE
Specific Gravity, Urine: 1.027
Urobilinogen, UA: 1
pH: 5.5

## 2011-06-25 LAB — BASIC METABOLIC PANEL
CO2: 26
Calcium: 9
GFR calc Af Amer: >60
GFR calc non Af Amer: >60
Sodium: 142

## 2011-06-25 LAB — CBC
HCT: 36.8
Hemoglobin: 12
MCHC: 34.7
Platelets: 188
RBC: 3.82 — ABNORMAL LOW
RDW: 12.9
WBC: 4.5
WBC: 8.6

## 2011-06-25 LAB — PROTIME-INR: INR: 1

## 2011-06-26 LAB — POCT I-STAT, CHEM 8
BUN: 17
Chloride: 107
Creatinine, Ser: 0.9
Potassium: 5.8 — ABNORMAL HIGH
Sodium: 139

## 2011-11-24 ENCOUNTER — Other Ambulatory Visit: Payer: Self-pay | Admitting: Orthopedic Surgery

## 2011-11-24 DIAGNOSIS — M961 Postlaminectomy syndrome, not elsewhere classified: Secondary | ICD-10-CM

## 2011-11-25 ENCOUNTER — Ambulatory Visit
Admission: RE | Admit: 2011-11-25 | Discharge: 2011-11-25 | Disposition: A | Discharge status: Home / Self Care | Payer: Medicare Other | Source: Ambulatory Visit | Attending: Orthopedic Surgery | Admitting: Orthopedic Surgery

## 2011-11-25 ENCOUNTER — Inpatient Hospital Stay: Admission: RE | Admit: 2011-11-25 | Payer: Medicare Other | Source: Ambulatory Visit

## 2011-11-25 DIAGNOSIS — M4800 Spinal stenosis, site unspecified: Secondary | ICD-10-CM

## 2011-11-25 DIAGNOSIS — M961 Postlaminectomy syndrome, not elsewhere classified: Secondary | ICD-10-CM

## 2011-11-25 MED ORDER — DIAZEPAM 5 MG PO TABS
5.0000 mg | ORAL_TABLET | Freq: Once | ORAL | Status: AC
  Administered 2011-11-25: 5 mg via ORAL

## 2011-11-25 NOTE — Discharge Instructions (Signed)
Myelogram Discharge Instructions  1. Go home and rest quietly for the next 24 hours.  It is important to lie flat for the next 24 hours.  Get up only to go to the restroom.  You may lie in the bed or on a couch on your back, your stomach, your left side or your right side.  You may have one pillow under your head.  You may have pillows between your knees while you are on your side or under your knees while you are on your back.  2. DO NOT drive today.  Recline the seat as far back as it will go, while still wearing your seat belt, on the way home.  3. You may get up to go to the bathroom as needed.  You may sit up for 10 minutes to eat.  You may resume your normal diet and medications unless otherwise indicated.  Drink lots of extra fluids today and tomorrow.  4. The incidence of headache, nausea, or vomiting is about 5% (one in 20 patients).  If you develop a headache, lie flat and drink plenty of fluids until the headache goes away.  Caffeinated beverages may be helpful.  If you develop severe nausea and vomiting or a headache that does not go away with flat bed rest, call [phone].  5. You may resume normal activities after your 24 hours of bed rest is over; however, do not exert yourself strongly or do any heavy lifting tomorrow.  6. Call your physician for a follow-up appointment.  The results of your myelogram will be sent directly to your physician by the following day.  7. If you have any questions or if complications develop after you arrive home, please call [phone].  Discharge instructions have been explained to the patient.  The patient, or the person responsible for the patient, fully understands these instructions.   May resume sertraline on February 28, after 1:00 pm.

## 2011-11-25 NOTE — Progress Notes (Signed)
Patient states she has been off her Sertraline for the past two days.  jkl

## 2012-03-01 ENCOUNTER — Ambulatory Visit: Payer: Medicare Other | Admitting: Cardiology

## 2012-09-15 ENCOUNTER — Encounter: Payer: Self-pay | Admitting: Internal Medicine

## 2012-10-18 ENCOUNTER — Other Ambulatory Visit: Payer: Self-pay | Admitting: Physician Assistant

## 2012-10-18 ENCOUNTER — Ambulatory Visit
Admission: RE | Admit: 2012-10-18 | Discharge: 2012-10-18 | Disposition: A | Discharge status: Home / Self Care | Payer: Medicare Other | Source: Ambulatory Visit | Attending: Physician Assistant | Admitting: Physician Assistant

## 2012-10-18 DIAGNOSIS — R52 Pain, unspecified: Secondary | ICD-10-CM

## 2013-03-29 ENCOUNTER — Other Ambulatory Visit: Payer: Self-pay | Admitting: Pain Medicine

## 2013-03-29 DIAGNOSIS — M25552 Pain in left hip: Secondary | ICD-10-CM

## 2013-04-05 ENCOUNTER — Ambulatory Visit
Admission: RE | Admit: 2013-04-05 | Discharge: 2013-04-05 | Disposition: A | Discharge status: Home / Self Care | Payer: Medicare Other | Source: Ambulatory Visit | Attending: Pain Medicine | Admitting: Pain Medicine

## 2013-04-05 DIAGNOSIS — M25552 Pain in left hip: Secondary | ICD-10-CM

## 2013-04-07 ENCOUNTER — Other Ambulatory Visit: Payer: Medicare Other

## 2014-05-02 ENCOUNTER — Encounter: Payer: Self-pay | Admitting: Internal Medicine

## 2014-07-06 ENCOUNTER — Encounter (HOSPITAL_COMMUNITY): Payer: Self-pay | Admitting: Pharmacy Technician

## 2014-07-06 NOTE — Progress Notes (Signed)
Please put orders in Epic surgery 07-17-14 pre op 07-10-14 Thanks 

## 2014-07-10 ENCOUNTER — Encounter (HOSPITAL_COMMUNITY): Payer: Self-pay

## 2014-07-10 ENCOUNTER — Encounter (INDEPENDENT_AMBULATORY_CARE_PROVIDER_SITE_OTHER): Payer: Self-pay

## 2014-07-10 ENCOUNTER — Encounter (HOSPITAL_COMMUNITY)
Admission: RE | Admit: 2014-07-10 | Discharge: 2014-07-10 | Disposition: A | Discharge status: Home / Self Care | Payer: Medicare Other | Source: Ambulatory Visit | Attending: Orthopedic Surgery | Admitting: Orthopedic Surgery

## 2014-07-10 ENCOUNTER — Encounter: Payer: Medicare Other | Admitting: Internal Medicine

## 2014-07-10 ENCOUNTER — Ambulatory Visit (HOSPITAL_COMMUNITY)
Admission: RE | Admit: 2014-07-10 | Discharge: 2014-07-10 | Disposition: A | Discharge status: Home / Self Care | Payer: Medicare Other | Source: Ambulatory Visit | Attending: Anesthesiology | Admitting: Anesthesiology

## 2014-07-10 DIAGNOSIS — K449 Diaphragmatic hernia without obstruction or gangrene: Secondary | ICD-10-CM | POA: Diagnosis not present

## 2014-07-10 DIAGNOSIS — I2510 Atherosclerotic heart disease of native coronary artery without angina pectoris: Secondary | ICD-10-CM

## 2014-07-10 DIAGNOSIS — I517 Cardiomegaly: Secondary | ICD-10-CM | POA: Insufficient documentation

## 2014-07-10 DIAGNOSIS — Z01818 Encounter for other preprocedural examination: Secondary | ICD-10-CM | POA: Diagnosis present

## 2014-07-10 HISTORY — DX: Personal history of urinary calculi: Z87.442

## 2014-07-10 HISTORY — DX: Foot drop, left foot: M21.372

## 2014-07-10 HISTORY — DX: Unspecified osteoarthritis, unspecified site: M19.90

## 2014-07-10 HISTORY — DX: Nausea with vomiting, unspecified: R11.2

## 2014-07-10 HISTORY — DX: Other specified postprocedural states: Z98.890

## 2014-07-10 LAB — URINALYSIS, ROUTINE W REFLEX MICROSCOPIC
Bilirubin Urine: NEGATIVE
GLUCOSE, UA: NEGATIVE mg/dL
HGB URINE DIPSTICK: NEGATIVE
KETONES UR: NEGATIVE mg/dL
LEUKOCYTES UA: NEGATIVE
Nitrite: NEGATIVE
PH: 7.5 (ref 5.0–8.0)
Protein, ur: NEGATIVE mg/dL
Specific Gravity, Urine: 1.018 (ref 1.005–1.030)
Urobilinogen, UA: 1 mg/dL (ref 0.0–1.0)

## 2014-07-10 LAB — CBC
HCT: 40.1 % (ref 36.0–46.0)
Hemoglobin: 12.9 g/dL (ref 12.0–15.0)
MCH: 30 pg (ref 26.0–34.0)
MCHC: 32.2 g/dL (ref 30.0–36.0)
MCV: 93.3 fL (ref 78.0–100.0)
PLATELETS: 186 10*3/uL (ref 150–400)
RBC: 4.3 MIL/uL (ref 3.87–5.11)
RDW: 12.7 % (ref 11.5–15.5)
WBC: 5.9 10*3/uL (ref 4.0–10.5)

## 2014-07-10 LAB — BASIC METABOLIC PANEL
ANION GAP: 10 (ref 5–15)
BUN: 14 mg/dL (ref 6–23)
CALCIUM: 9.5 mg/dL (ref 8.4–10.5)
CO2: 29 meq/L (ref 19–32)
CREATININE: 0.71 mg/dL (ref 0.50–1.10)
Chloride: 102 mEq/L (ref 96–112)
GFR, EST NON AFRICAN AMERICAN: 84 mL/min — AB (ref >90)
Glucose, Bld: 97 mg/dL (ref 70–99)
Potassium: 4.6 mEq/L (ref 3.7–5.3)
Sodium: 141 mEq/L (ref 137–147)

## 2014-07-10 LAB — PROTIME-INR
INR: 1.08 (ref 0.00–1.49)
Prothrombin Time: 14.1 seconds (ref 11.6–15.2)

## 2014-07-10 LAB — SURGICAL PCR SCREEN
MRSA, PCR: NEGATIVE
Staphylococcus aureus: NEGATIVE

## 2014-07-10 LAB — APTT: aPTT: 31 seconds (ref 24–37)

## 2014-07-10 LAB — ABO/RH: ABO/RH(D): O POS

## 2014-07-10 NOTE — Pre-Procedure Instructions (Signed)
EKG AND CXR WERE DONE TODAY - PREOP AT [facility]. 

## 2014-07-10 NOTE — Patient Instructions (Addendum)
[facility]  ON:  Tuesday  10/20  REPORT TO  SHORT STAY CENTER AT:  9:50 AM    DO NOT EAT OR DRINK ANYTHING AFTER MIDNIGHT THE NIGHT BEFORE YOUR SURGERY.  YOU MAY BRUSH YOUR TEETH, RINSE OUT YOUR MOUTH--BUT NO WATER, NO FOOD, NO CHEWING GUM, NO MINTS, NO CANDIES, NO CHEWING TOBACCO.  PLEASE TAKE THE FOLLOWING MEDICATIONS THE AM OF YOUR SURGERY WITH A FEW SIPS OF WATER:   HYDROCODONE / ACETAMINOPHEN FOR PAIN IF NEEDED, CARDURA (DOXAZOSIN), GABAPENTIN (NEURONTIN), ISOSORBIDE ( IMDUR ), OMEPRAZOLE (PRILOSEC), SERTRALINE ( ZOLOFT), SIMVASTATIN ( ZOCOR ).   DO NOT BRING VALUABLES, MONEY, CREDIT CARDS.  DO NOT WEAR JEWELRY, MAKE-UP, NAIL POLISH AND NO METAL PINS OR CLIPS IN YOUR HAIR. CONTACT LENS, DENTURES / PARTIALS, GLASSES SHOULD NOT BE WORN TO SURGERY AND IN MOST CASES-HEARING AIDS WILL NEED TO BE REMOVED.  BRING YOUR GLASSES CASE, ANY EQUIPMENT NEEDED FOR YOUR CONTACT LENS. FOR PATIENTS ADMITTED TO THE HOSPITAL--CHECK OUT TIME THE DAY OF DISCHARGE IS 11:00 AM.  ALL INPATIENT ROOMS ARE PRIVATE - WITH BATHROOM, TELEPHONE, TELEVISION AND WIFI INTERNET.   PLEASE BE AWARE THAT YOU MAY NEED ADDITIONAL BLOOD DRAWN DAY OF YOUR SURGERY  _______________________________________________________________________   [facility] Before surgery, you can play an important role.  Because skin is not sterile, your skin needs to be as free of germs as possible.  You can reduce the number of germs on your skin by washing with CHG (chlorahexidine gluconate) soap before surgery.  CHG is an antiseptic cleaner which kills germs and bonds with the skin to continue killing germs even after washing. Please DO NOT use if you have an allergy to CHG or antibacterial soaps.  If your skin becomes reddened/irritated stop using the CHG and inform your nurse when you arrive at Short Stay. Do not shave (including legs and underarms) for at least 48 hours prior  to the first CHG shower.  You may shave your face/neck. Please follow these instructions carefully:  1.  Shower with CHG Soap the night before surgery and the  morning of Surgery.  2.  If you choose to wash your hair, wash your hair first as usual with your  normal  shampoo.  3.  After you shampoo, rinse your hair and body thoroughly to remove the  shampoo.                           4.  Use CHG as you would any other liquid soap.  You can apply chg directly  to the skin and wash                       Gently with a scrungie or clean washcloth.  5.  Apply the CHG Soap to your body ONLY FROM THE NECK DOWN.   Do not use on face/ open                           Wound or open sores. Avoid contact with eyes, ears mouth and genitals (private parts).                       Wash face,  Genitals (private parts) with your normal soap.             6.  Wash thoroughly, paying  special attention to the area where your surgery  will be performed.  7.  Thoroughly rinse your body with warm water from the neck down.  8.  DO NOT shower/wash with your normal soap after using and rinsing off  the CHG Soap.                9.  Pat yourself dry with a clean towel.            10.  Wear clean pajamas.            11.  Place clean sheets on your bed the night of your first shower and do not  sleep with pets. Day of Surgery : Do not apply any lotions/deodorants the morning of surgery.  Please wear clean clothes to the hospital/surgery center.  FAILURE TO FOLLOW THESE INSTRUCTIONS MAY RESULT IN THE CANCELLATION OF YOUR SURGERY PATIENT SIGNATURE_________________________________  NURSE SIGNATURE__________________________________  ________________________________________________________________________   Rogelia MireIncentive Spirometer  An incentive spirometer is a tool that can help keep your lungs clear and active. This tool measures how well you are filling your lungs with each breath. Taking long deep breaths may help reverse or  decrease the chance of developing breathing (pulmonary) problems (especially infection) following:  A long period of time when you are unable to move or be active. BEFORE THE PROCEDURE   If the spirometer includes an indicator to show your best effort, your nurse or respiratory therapist will set it to a desired goal.  If possible, sit up straight or lean slightly forward. Try not to slouch.  Hold the incentive spirometer in an upright position. INSTRUCTIONS FOR USE  1. Sit on the edge of your bed if possible, or sit up as far as you can in bed or on a chair. 2. Hold the incentive spirometer in an upright position. 3. Breathe out normally. 4. Place the mouthpiece in your mouth and seal your lips tightly around it. 5. Breathe in slowly and as deeply as possible, raising the piston or the ball toward the top of the column. 6. Hold your breath for 3-5 seconds or for as long as possible. Allow the piston or ball to fall to the bottom of the column. 7. Remove the mouthpiece from your mouth and breathe out normally. 8. Rest for a few seconds and repeat Steps 1 through 7 at least 10 times every 1-2 hours when you are awake. Take your time and take a few normal breaths between deep breaths. 9. The spirometer may include an indicator to show your best effort. Use the indicator as a goal to work toward during each repetition. 10. After each set of 10 deep breaths, practice coughing to be sure your lungs are clear. If you have an incision (the cut made at the time of surgery), support your incision when coughing by placing a pillow or rolled up towels firmly against it. Once you are able to get out of bed, walk around indoors and cough well. You may stop using the incentive spirometer when instructed by your caregiver.  RISKS AND COMPLICATIONS  Take your time so you do not get dizzy or light-headed.  If you are in pain, you may need to take or ask for pain medication before doing incentive spirometry.  It is harder to take a deep breath if you are having pain. AFTER USE  Rest and breathe slowly and easily.  It can be helpful to keep track of a log of your progress. Your caregiver can  provide you with a simple table to help with this. If you are using the spirometer at home, follow these instructions: SEEK MEDICAL CARE IF:   You are having difficultly using the spirometer.  You have trouble using the spirometer as often as instructed.  Your pain medication is not giving enough relief while using the spirometer.  You develop fever of 100.5 F (38.1 C) or higher. SEEK IMMEDIATE MEDICAL CARE IF:   You cough up bloody sputum that had not been present before.  You develop fever of 102 F (38.9 C) or greater.  You develop worsening pain at or near the incision site. MAKE SURE YOU:   Understand these instructions.  Will watch your condition.  Will get help right away if you are not doing well or get worse. Document Released: 01/25/2007 Document Revised: 12/07/2011 Document Reviewed: 03/28/2007 [employer] Patient Information 2014 Exit[employer], [place].   ________________________________________________________________________  WHAT IS A BLOOD TRANSFUSION? Blood Transfusion Information  A transfusion is the replacement of blood or some of its parts. Blood is made up of multiple cells which provide different functions.  Red blood cells carry oxygen and are used for blood loss replacement.  White blood cells fight against infection.  Platelets control bleeding.  Plasma helps clot blood.  Other blood products are available for specialized needs, such as hemophilia or other clotting disorders. BEFORE THE TRANSFUSION  Who gives blood for transfusions?   Healthy volunteers who are fully evaluated to make sure their blood is safe. This is blood bank blood. Transfusion therapy is the safest it has ever been in the practice of medicine. Before blood is taken from a donor, a complete  history is taken to make sure that person has no history of diseases nor engages in risky social behavior (examples are intravenous drug use or sexual activity with multiple partners). The donor's travel history is screened to minimize risk of transmitting infections, such as malaria. The donated blood is tested for signs of infectious diseases, such as HIV and hepatitis. The blood is then tested to be sure it is compatible with you in order to minimize the chance of a transfusion reaction. If you or a relative donates blood, this is often done in anticipation of surgery and is not appropriate for emergency situations. It takes many days to process the donated blood. RISKS AND COMPLICATIONS Although transfusion therapy is very safe and saves many lives, the main dangers of transfusion include:   Getting an infectious disease.  Developing a transfusion reaction. This is an allergic reaction to something in the blood you were given. Every precaution is taken to prevent this. The decision to have a blood transfusion has been considered carefully by your caregiver before blood is given. Blood is not given unless the benefits outweigh the risks. AFTER THE TRANSFUSION  Right after receiving a blood transfusion, you will usually feel much better and more energetic. This is especially true if your red blood cells have gotten low (anemic). The transfusion raises the level of the red blood cells which carry oxygen, and this usually causes an energy increase.  The nurse administering the transfusion will monitor you carefully for complications. HOME CARE INSTRUCTIONS  No special instructions are needed after a transfusion. You may find your energy is better. Speak with your caregiver about any limitations on activity for underlying diseases you may have. SEEK MEDICAL CARE IF:   Your condition is not improving after your transfusion.  You develop redness or irritation at the intravenous (IV)  site. SEEK  IMMEDIATE MEDICAL CARE IF:  Any of the following symptoms occur over the next 12 hours:  Shaking chills.  You have a temperature by mouth above 102 F (38.9 C), not controlled by medicine.  Chest, back, or muscle pain.  People around you feel you are not acting correctly or are confused.  Shortness of breath or difficulty breathing.  Dizziness and fainting.  You get a rash or develop hives.  You have a decrease in urine output.  Your urine turns a dark color or changes to pink, red, or brown. Any of the following symptoms occur over the next 10 days:  You have a temperature by mouth above 102 F (38.9 C), not controlled by medicine.  Shortness of breath.  Weakness after normal activity.  The white part of the eye turns yellow (jaundice).  You have a decrease in the amount of urine or are urinating less often.  Your urine turns a dark color or changes to pink, red, or brown. Document Released: 09/11/2000 Document Revised: 12/07/2011 Document Reviewed: 04/30/2008 [facility] Patient Information 2014 [place], [place].  _______________________________________________________________________

## 2014-07-15 NOTE — H&P (Signed)
TOTAL HIP ADMISSION H&P  Patient is admitted for left total hip arthroplasty, anterior approach.  Subjective:  Chief Complaint:    Left hip OA / pain  HPI: [name], 73 y.o. female, has a history of pain and functional disability in the left hip(s) due to arthritis and patient has failed non-surgical conservative treatments for greater than 12 weeks to include NSAID's and/or analgesics, corticosteriod injections, use of assistive devices and activity modification.  Onset of symptoms was gradual starting 1+ years ago with gradually worsening course since that time.The patient noted no past surgery on the left hip(s).  Patient currently rates pain in the left hip at 7 out of 10 with activity. Patient has night pain, worsening of pain with activity and weight bearing, trendelenberg gait, pain that interfers with activities of daily living and pain with passive range of motion. Patient has evidence of periarticular osteophytes and joint space narrowing by imaging studies. This condition presents safety issues increasing the risk of falls.  There is no current active infection.  Risks, benefits and expectations were discussed with the patient.  Risks including but not limited to the risk of anesthesia, blood clots, nerve damage, blood vessel damage, failure of the prosthesis, infection and up to and including death.  Patient understand the risks, benefits and expectations and wishes to proceed with surgery.   PCP: Pamelia HoitWILSON,FRED HENRY, MD  D/C Plans:      SNF    Post-op Meds:       No Rx given  Tranexamic Acid:      To be given - IV    Decadron:      Is to be given  FYI:     ASA post-op  Norco post-op    Patient Active Problem List   Diagnosis Date Noted  . Hypertension   . Hyperlipidemia   . Coronary artery disease   . Spinal stenosis    Past Medical History  Diagnosis Date  . Hypertension   . Hyperlipidemia   . Chest pain   . Coronary artery disease     NONOBSTRUCTIVE  .  Depression   . Chronic back pain     "neurostimulator electrode leads overlie the lower thoracic spinal canal" - per cxr report 07/10/14  . Spinal stenosis   . GERD (gastroesophageal reflux disease)   . PONV (postoperative nausea and vomiting)   . History of kidney stones   . Arthritis   . Left foot drop     SINCE 2009 - RELATED TO BACK PROBLEM - WEARS BRACE    Past Surgical History  Procedure Laterality Date  . Cardiac catheterization  02/22/2009    EF 60%  . Back surgery    . Cystoscopy      FOR KIDNEY STONE  . Lumbar laminectomy/decompression microdiscectomy      No prescriptions prior to admission   No Known Allergies   History  Substance Use Topics  . Smoking status: Never Smoker   . Smokeless tobacco: Never Used  . Alcohol Use: No    No family history on file.   Review of Systems  Constitutional: Negative.   HENT: Negative.   Eyes: Negative.   Respiratory: Negative.   Cardiovascular: Negative.   Gastrointestinal: Positive for heartburn.  Genitourinary: Negative.   Musculoskeletal: Positive for back pain and joint pain.  Skin: Negative.   Neurological: Negative.   Endo/Heme/Allergies: Negative.   Psychiatric/Behavioral: Positive for depression.    Objective:  Physical Exam  Constitutional: She is oriented  to person, place, and time. She appears well-developed and well-nourished.  HENT:  Head: Normocephalic and atraumatic.  Mouth/Throat: Oropharynx is clear and moist.  Eyes: Pupils are equal, round, and reactive to light.  Neck: Neck supple. No JVD present. No tracheal deviation present. No thyromegaly present.  Cardiovascular: Normal rate, regular rhythm, normal heart sounds and intact distal pulses.   Respiratory: Effort normal and breath sounds normal. No stridor. No respiratory distress. She has no wheezes.  GI: Soft. There is no tenderness. There is no guarding.  Musculoskeletal:       Left hip: She exhibits decreased range of motion, decreased  strength, tenderness and bony tenderness. She exhibits no swelling, no crepitus and no laceration.  Lymphadenopathy:    She has no cervical adenopathy.  Neurological: She is alert and oriented to person, place, and time.  Skin: Skin is warm and dry.  Psychiatric: She has a normal mood and affect.      Labs:  Estimated body mass index is 28.65 kg/(m^2) as calculated from the following:   Height as of 11/25/11: 5\' 4"  (1.626 m).   Weight as of 11/25/11: 75.751 kg (167 lb).   Imaging Review Plain radiographs demonstrate severe degenerative joint disease of the left hip(s). The bone quality appears to be good for age and reported activity level.  Assessment/Plan:  End stage arthritis, left hip(s)  The patient history, physical examination, clinical judgement of the provider and imaging studies are consistent with end stage degenerative joint disease of the left hip(s) and total hip arthroplasty is deemed medically necessary. The treatment options including medical management, injection therapy, arthroscopy and arthroplasty were discussed at length. The risks and benefits of total hip arthroplasty were presented and reviewed. The risks due to aseptic loosening, infection, stiffness, dislocation/subluxation,  thromboembolic complications and other imponderables were discussed.  The patient acknowledged the explanation, agreed to proceed with the plan and consent was signed. Patient is being admitted for inpatient treatment for surgery, pain control, PT, OT, prophylactic antibiotics, VTE prophylaxis, progressive ambulation and ADL's and discharge planning.The patient is planning to be discharged to skilled nursing facility.      Anastasio AuerbachMatthew S. Valeda Corzine   PA-C  07/15/2014, 10:19 PM

## 2014-07-17 ENCOUNTER — Inpatient Hospital Stay (HOSPITAL_COMMUNITY): Payer: Medicare Other

## 2014-07-17 ENCOUNTER — Encounter (HOSPITAL_COMMUNITY): Payer: Medicare Other | Admitting: Anesthesiology

## 2014-07-17 ENCOUNTER — Encounter (HOSPITAL_COMMUNITY)
Admission: RE | Disposition: A | Discharge status: Home Health | Payer: Self-pay | Source: Ambulatory Visit | Attending: Orthopedic Surgery

## 2014-07-17 ENCOUNTER — Encounter (HOSPITAL_COMMUNITY): Payer: Self-pay | Admitting: *Deleted

## 2014-07-17 ENCOUNTER — Inpatient Hospital Stay (HOSPITAL_COMMUNITY)
Admission: RE | Admit: 2014-07-17 | Discharge: 2014-07-20 | DRG: 470 | Disposition: A | Discharge status: Home Health | Payer: Medicare Other | Source: Ambulatory Visit | Attending: Orthopedic Surgery | Admitting: Orthopedic Surgery

## 2014-07-17 ENCOUNTER — Inpatient Hospital Stay (HOSPITAL_COMMUNITY): Payer: Medicare Other | Admitting: Anesthesiology

## 2014-07-17 DIAGNOSIS — M25552 Pain in left hip: Secondary | ICD-10-CM | POA: Diagnosis present

## 2014-07-17 DIAGNOSIS — M21372 Foot drop, left foot: Secondary | ICD-10-CM | POA: Diagnosis present

## 2014-07-17 DIAGNOSIS — M1612 Unilateral primary osteoarthritis, left hip: Secondary | ICD-10-CM | POA: Diagnosis present

## 2014-07-17 DIAGNOSIS — E785 Hyperlipidemia, unspecified: Secondary | ICD-10-CM | POA: Diagnosis present

## 2014-07-17 DIAGNOSIS — I2510 Atherosclerotic heart disease of native coronary artery without angina pectoris: Secondary | ICD-10-CM | POA: Diagnosis present

## 2014-07-17 DIAGNOSIS — G8929 Other chronic pain: Secondary | ICD-10-CM | POA: Diagnosis present

## 2014-07-17 DIAGNOSIS — Z87442 Personal history of urinary calculi: Secondary | ICD-10-CM

## 2014-07-17 DIAGNOSIS — M4800 Spinal stenosis, site unspecified: Secondary | ICD-10-CM | POA: Diagnosis present

## 2014-07-17 DIAGNOSIS — Z6828 Body mass index (BMI) 28.0-28.9, adult: Secondary | ICD-10-CM | POA: Diagnosis not present

## 2014-07-17 DIAGNOSIS — I10 Essential (primary) hypertension: Secondary | ICD-10-CM | POA: Diagnosis present

## 2014-07-17 DIAGNOSIS — E669 Obesity, unspecified: Secondary | ICD-10-CM | POA: Diagnosis present

## 2014-07-17 DIAGNOSIS — K219 Gastro-esophageal reflux disease without esophagitis: Secondary | ICD-10-CM | POA: Diagnosis present

## 2014-07-17 DIAGNOSIS — F329 Major depressive disorder, single episode, unspecified: Secondary | ICD-10-CM | POA: Diagnosis present

## 2014-07-17 DIAGNOSIS — Z96649 Presence of unspecified artificial hip joint: Secondary | ICD-10-CM

## 2014-07-17 HISTORY — PX: TOTAL HIP ARTHROPLASTY: SHX124

## 2014-07-17 LAB — TYPE AND SCREEN
ABO/RH(D): O POS
Antibody Screen: NEGATIVE

## 2014-07-17 SURGERY — ARTHROPLASTY, HIP, TOTAL, ANTERIOR APPROACH
Anesthesia: General | Site: Hip | Laterality: Left

## 2014-07-17 MED ORDER — PROPOFOL 10 MG/ML IV BOLUS
INTRAVENOUS | Status: AC
  Filled 2014-07-17: qty 20

## 2014-07-17 MED ORDER — ISOSORBIDE MONONITRATE ER 60 MG PO TB24
60.0000 mg | ORAL_TABLET | Freq: Every day | ORAL | Status: DC
  Administered 2014-07-18 – 2014-07-20 (×3): 60 mg via ORAL
  Filled 2014-07-17 (×3): qty 1

## 2014-07-17 MED ORDER — LACTATED RINGERS IV SOLN
INTRAVENOUS | Status: DC
  Administered 2014-07-17: 1000 mL via INTRAVENOUS
  Administered 2014-07-17: 14:00:00 via INTRAVENOUS

## 2014-07-17 MED ORDER — DEXAMETHASONE SODIUM PHOSPHATE 10 MG/ML IJ SOLN
INTRAMUSCULAR | Status: AC
  Filled 2014-07-17: qty 1

## 2014-07-17 MED ORDER — HYDROCODONE-ACETAMINOPHEN 7.5-325 MG PO TABS
1.0000 | ORAL_TABLET | ORAL | Status: DC
  Administered 2014-07-17: 2 via ORAL
  Administered 2014-07-17 – 2014-07-18 (×3): 1 via ORAL
  Administered 2014-07-18 – 2014-07-20 (×8): 2 via ORAL
  Filled 2014-07-17 (×4): qty 2
  Filled 2014-07-17 (×2): qty 1
  Filled 2014-07-17: qty 2
  Filled 2014-07-17: qty 1
  Filled 2014-07-17 (×5): qty 2

## 2014-07-17 MED ORDER — ONDANSETRON HCL 4 MG/2ML IJ SOLN
INTRAMUSCULAR | Status: DC | PRN
  Administered 2014-07-17: 4 mg via INTRAVENOUS

## 2014-07-17 MED ORDER — CEFAZOLIN SODIUM-DEXTROSE 2-3 GM-% IV SOLR
INTRAVENOUS | Status: AC
  Filled 2014-07-17: qty 50

## 2014-07-17 MED ORDER — BISACODYL 10 MG RE SUPP: 10.0000 mg | Freq: Every day | RECTAL | Status: DC | PRN

## 2014-07-17 MED ORDER — DEXAMETHASONE SODIUM PHOSPHATE 10 MG/ML IJ SOLN
10.0000 mg | Freq: Once | INTRAMUSCULAR | Status: AC
  Administered 2014-07-17: 10 mg via INTRAVENOUS

## 2014-07-17 MED ORDER — FENTANYL CITRATE 0.05 MG/ML IJ SOLN
INTRAMUSCULAR | Status: DC | PRN
  Administered 2014-07-17: 100 ug via INTRAVENOUS

## 2014-07-17 MED ORDER — DEXAMETHASONE SODIUM PHOSPHATE 10 MG/ML IJ SOLN
10.0000 mg | Freq: Once | INTRAMUSCULAR | Status: AC
  Administered 2014-07-18: 10 mg via INTRAVENOUS
  Filled 2014-07-17: qty 1

## 2014-07-17 MED ORDER — CELECOXIB 200 MG PO CAPS
200.0000 mg | ORAL_CAPSULE | Freq: Two times a day (BID) | ORAL | Status: DC
  Administered 2014-07-17 – 2014-07-20 (×6): 200 mg via ORAL
  Filled 2014-07-17 (×7): qty 1

## 2014-07-17 MED ORDER — METOCLOPRAMIDE HCL 5 MG/ML IJ SOLN: 5.0000 mg | Freq: Three times a day (TID) | INTRAMUSCULAR | Status: DC | PRN

## 2014-07-17 MED ORDER — METHOCARBAMOL 1000 MG/10ML IJ SOLN
500.0000 mg | Freq: Four times a day (QID) | INTRAVENOUS | Status: DC | PRN
  Administered 2014-07-17: 500 mg via INTRAVENOUS
  Filled 2014-07-17: qty 5

## 2014-07-17 MED ORDER — CEFAZOLIN SODIUM-DEXTROSE 2-3 GM-% IV SOLR
2.0000 g | INTRAVENOUS | Status: AC
  Administered 2014-07-17: 2 g via INTRAVENOUS

## 2014-07-17 MED ORDER — GLYCOPYRROLATE 0.2 MG/ML IJ SOLN
INTRAMUSCULAR | Status: DC | PRN
  Administered 2014-07-17: 0.2 mg via INTRAVENOUS

## 2014-07-17 MED ORDER — SODIUM CHLORIDE 0.9 % IV SOLN
100.0000 mL/h | INTRAVENOUS | Status: DC
  Administered 2014-07-17: 100 mL/h via INTRAVENOUS
  Filled 2014-07-17 (×8): qty 1000

## 2014-07-17 MED ORDER — ATORVASTATIN CALCIUM 40 MG PO TABS
40.0000 mg | ORAL_TABLET | Freq: Every day | ORAL | Status: DC
  Administered 2014-07-17 – 2014-07-19 (×3): 40 mg via ORAL
  Filled 2014-07-17 (×4): qty 1

## 2014-07-17 MED ORDER — LIDOCAINE HCL (CARDIAC) 20 MG/ML IV SOLN
INTRAVENOUS | Status: DC | PRN
  Administered 2014-07-17: 20 mg via INTRAVENOUS

## 2014-07-17 MED ORDER — MENTHOL 3 MG MT LOZG
1.0000 | LOZENGE | OROMUCOSAL | Status: DC | PRN
Start: 2014-07-17 — End: 2014-07-20
  Filled 2014-07-17: qty 9

## 2014-07-17 MED ORDER — HYDROMORPHONE HCL 1 MG/ML IJ SOLN: 0.5000 mg | INTRAMUSCULAR | Status: DC | PRN

## 2014-07-17 MED ORDER — CHLORHEXIDINE GLUCONATE 4 % EX LIQD: 60.0000 mL | Freq: Once | CUTANEOUS | Status: DC

## 2014-07-17 MED ORDER — POLYETHYLENE GLYCOL 3350 17 G PO PACK
17.0000 g | PACK | Freq: Two times a day (BID) | ORAL | Status: DC
  Administered 2014-07-18 – 2014-07-20 (×5): 17 g via ORAL
  Filled 2014-07-17 (×7): qty 1

## 2014-07-17 MED ORDER — ONDANSETRON HCL 4 MG PO TABS: 4.0000 mg | ORAL_TABLET | Freq: Four times a day (QID) | ORAL | Status: DC | PRN

## 2014-07-17 MED ORDER — FERROUS SULFATE 325 (65 FE) MG PO TABS
325.0000 mg | ORAL_TABLET | Freq: Three times a day (TID) | ORAL | Status: DC
  Administered 2014-07-17 – 2014-07-20 (×7): 325 mg via ORAL
  Filled 2014-07-17 (×11): qty 1

## 2014-07-17 MED ORDER — PROPOFOL 10 MG/ML IV BOLUS
INTRAVENOUS | Status: DC | PRN
  Administered 2014-07-17: 100 mg via INTRAVENOUS

## 2014-07-17 MED ORDER — PROMETHAZINE HCL 25 MG/ML IJ SOLN: 6.2500 mg | INTRAMUSCULAR | Status: DC | PRN

## 2014-07-17 MED ORDER — 0.9 % SODIUM CHLORIDE (POUR BTL) OPTIME
TOPICAL | Status: DC | PRN
  Administered 2014-07-17: 1000 mL

## 2014-07-17 MED ORDER — LIDOCAINE HCL (CARDIAC) 20 MG/ML IV SOLN
INTRAVENOUS | Status: AC
  Filled 2014-07-17: qty 5

## 2014-07-17 MED ORDER — PANTOPRAZOLE SODIUM 40 MG PO TBEC
40.0000 mg | DELAYED_RELEASE_TABLET | Freq: Every day | ORAL | Status: DC
  Administered 2014-07-17 – 2014-07-20 (×4): 40 mg via ORAL
  Filled 2014-07-17 (×5): qty 1

## 2014-07-17 MED ORDER — ONDANSETRON HCL 4 MG/2ML IJ SOLN
INTRAMUSCULAR | Status: AC
  Filled 2014-07-17: qty 2

## 2014-07-17 MED ORDER — SUCCINYLCHOLINE CHLORIDE 20 MG/ML IJ SOLN
INTRAMUSCULAR | Status: DC | PRN
  Administered 2014-07-17: 80 mg via INTRAVENOUS

## 2014-07-17 MED ORDER — METOCLOPRAMIDE HCL 10 MG PO TABS: 5.0000 mg | ORAL_TABLET | Freq: Three times a day (TID) | ORAL | Status: DC | PRN

## 2014-07-17 MED ORDER — ALUM & MAG HYDROXIDE-SIMETH 200-200-20 MG/5ML PO SUSP
30.0000 mL | ORAL | Status: DC | PRN
Start: 2014-07-17 — End: 2014-07-20

## 2014-07-17 MED ORDER — GABAPENTIN 300 MG PO CAPS
300.0000 mg | ORAL_CAPSULE | Freq: Three times a day (TID) | ORAL | Status: DC
  Administered 2014-07-17 – 2014-07-20 (×9): 300 mg via ORAL
  Filled 2014-07-17 (×11): qty 1

## 2014-07-17 MED ORDER — HYDROMORPHONE HCL 1 MG/ML IJ SOLN
INTRAMUSCULAR | Status: DC | PRN
  Administered 2014-07-17 (×2): 1 mg via INTRAVENOUS

## 2014-07-17 MED ORDER — PHENOL 1.4 % MT LIQD
1.0000 | OROMUCOSAL | Status: DC | PRN
  Administered 2014-07-17: 1 via OROMUCOSAL
  Filled 2014-07-17: qty 177

## 2014-07-17 MED ORDER — ASPIRIN EC 325 MG PO TBEC
325.0000 mg | DELAYED_RELEASE_TABLET | Freq: Two times a day (BID) | ORAL | Status: DC
  Administered 2014-07-18 – 2014-07-20 (×5): 325 mg via ORAL
  Filled 2014-07-17 (×7): qty 1

## 2014-07-17 MED ORDER — EPHEDRINE SULFATE 50 MG/ML IJ SOLN
INTRAMUSCULAR | Status: DC | PRN
  Administered 2014-07-17: 5 mg via INTRAVENOUS

## 2014-07-17 MED ORDER — METHOCARBAMOL 500 MG PO TABS: 500.0000 mg | ORAL_TABLET | Freq: Four times a day (QID) | ORAL | Status: DC | PRN

## 2014-07-17 MED ORDER — SERTRALINE HCL 100 MG PO TABS
200.0000 mg | ORAL_TABLET | Freq: Every day | ORAL | Status: DC
  Administered 2014-07-18 – 2014-07-20 (×3): 200 mg via ORAL
  Filled 2014-07-17 (×3): qty 2

## 2014-07-17 MED ORDER — HYDROMORPHONE HCL 1 MG/ML IJ SOLN
0.2500 mg | INTRAMUSCULAR | Status: DC | PRN
  Administered 2014-07-17 (×2): 0.5 mg via INTRAVENOUS

## 2014-07-17 MED ORDER — TRANEXAMIC ACID 100 MG/ML IV SOLN
1000.0000 mg | Freq: Once | INTRAVENOUS | Status: AC
  Administered 2014-07-17: 1000 mg via INTRAVENOUS
  Filled 2014-07-17: qty 10

## 2014-07-17 MED ORDER — DOCUSATE SODIUM 100 MG PO CAPS
100.0000 mg | ORAL_CAPSULE | Freq: Two times a day (BID) | ORAL | Status: DC
  Administered 2014-07-17 – 2014-07-20 (×6): 100 mg via ORAL
  Filled 2014-07-17 (×7): qty 1

## 2014-07-17 MED ORDER — MIDAZOLAM HCL 5 MG/5ML IJ SOLN
INTRAMUSCULAR | Status: DC | PRN
  Administered 2014-07-17: 2 mg via INTRAVENOUS

## 2014-07-17 MED ORDER — FENTANYL CITRATE 0.05 MG/ML IJ SOLN
INTRAMUSCULAR | Status: AC
Start: 2014-07-17 — End: 2014-07-17
  Filled 2014-07-17: qty 2

## 2014-07-17 MED ORDER — MIDAZOLAM HCL 2 MG/2ML IJ SOLN
INTRAMUSCULAR | Status: AC
  Filled 2014-07-17: qty 2

## 2014-07-17 MED ORDER — MAGNESIUM CITRATE PO SOLN: 1.0000 | Freq: Once | ORAL | Status: AC | PRN

## 2014-07-17 MED ORDER — CEFAZOLIN SODIUM-DEXTROSE 2-3 GM-% IV SOLR
2.0000 g | Freq: Four times a day (QID) | INTRAVENOUS | Status: AC
  Administered 2014-07-17 – 2014-07-18 (×2): 2 g via INTRAVENOUS
  Filled 2014-07-17 (×2): qty 50

## 2014-07-17 MED ORDER — HYDROMORPHONE HCL 2 MG/ML IJ SOLN
INTRAMUSCULAR | Status: AC
  Filled 2014-07-17: qty 1

## 2014-07-17 MED ORDER — DOXAZOSIN MESYLATE 8 MG PO TABS
8.0000 mg | ORAL_TABLET | Freq: Every day | ORAL | Status: DC
  Administered 2014-07-18 – 2014-07-20 (×3): 8 mg via ORAL
  Filled 2014-07-17 (×3): qty 1

## 2014-07-17 MED ORDER — ONDANSETRON HCL 4 MG/2ML IJ SOLN: 4.0000 mg | Freq: Four times a day (QID) | INTRAMUSCULAR | Status: DC | PRN

## 2014-07-17 MED ORDER — FENTANYL CITRATE 0.05 MG/ML IJ SOLN
INTRAMUSCULAR | Status: AC
  Filled 2014-07-17: qty 2

## 2014-07-17 MED ORDER — SODIUM CHLORIDE 0.9 % IR SOLN
Status: DC | PRN
  Administered 2014-07-17: 1000 mL

## 2014-07-17 MED ORDER — HYDROMORPHONE HCL 1 MG/ML IJ SOLN
INTRAMUSCULAR | Status: AC
  Filled 2014-07-17: qty 1

## 2014-07-17 MED ORDER — DIPHENHYDRAMINE HCL 25 MG PO CAPS: 25.0000 mg | ORAL_CAPSULE | Freq: Four times a day (QID) | ORAL | Status: DC | PRN

## 2014-07-17 SURGICAL SUPPLY — 46 items
ADH SKN CLS APL DERMABOND .7 (GAUZE/BANDAGES/DRESSINGS) ×1
BAG SPEC THK2 15X12 ZIP CLS (MISCELLANEOUS)
BAG ZIPLOCK 12X15 (MISCELLANEOUS) IMPLANT
CAPT HIP PF MOP ×1 IMPLANT
COVER PERINEAL POST (MISCELLANEOUS) ×2 IMPLANT
DERMABOND ADVANCED (GAUZE/BANDAGES/DRESSINGS) ×1
DERMABOND ADVANCED .7 DNX12 (GAUZE/BANDAGES/DRESSINGS) ×1 IMPLANT
DRAPE C-ARM 42X120 X-RAY (DRAPES) ×2 IMPLANT
DRAPE STERI IOBAN 125X83 (DRAPES) ×2 IMPLANT
DRAPE U-SHAPE 47X51 STRL (DRAPES) ×6 IMPLANT
DRSG AQUACEL AG ADV 3.5X10 (GAUZE/BANDAGES/DRESSINGS) ×2 IMPLANT
DURAPREP 26ML APPLICATOR (WOUND CARE) ×2 IMPLANT
ELECT BLADE TIP CTD 4 INCH (ELECTRODE) ×2 IMPLANT
ELECT REM PT RETURN 9FT ADLT (ELECTROSURGICAL) ×2
ELECTRODE REM PT RTRN 9FT ADLT (ELECTROSURGICAL) ×1 IMPLANT
FACESHIELD WRAPAROUND (MASK) ×8 IMPLANT
FACESHIELD WRAPAROUND OR TEAM (MASK) ×4 IMPLANT
GLOVE BIO SURGEON STRL SZ7 (GLOVE) ×1 IMPLANT
GLOVE BIOGEL PI IND STRL 6.5 (GLOVE) IMPLANT
GLOVE BIOGEL PI IND STRL 7.0 (GLOVE) IMPLANT
GLOVE BIOGEL PI IND STRL 7.5 (GLOVE) ×1 IMPLANT
GLOVE BIOGEL PI IND STRL 8.5 (GLOVE) ×1 IMPLANT
GLOVE BIOGEL PI INDICATOR 6.5 (GLOVE) ×1
GLOVE BIOGEL PI INDICATOR 7.0 (GLOVE) ×1
GLOVE BIOGEL PI INDICATOR 7.5 (GLOVE) ×2
GLOVE BIOGEL PI INDICATOR 8.5 (GLOVE) ×1
GLOVE ECLIPSE 8.0 STRL XLNG CF (GLOVE) ×2 IMPLANT
GLOVE ORTHO TXT STRL SZ7.5 (GLOVE) ×4 IMPLANT
GLOVE SURG SS PI 7.5 STRL IVOR (GLOVE) ×1 IMPLANT
GOWN BRE IMP PREV XXLGXLNG (GOWN DISPOSABLE) ×1 IMPLANT
GOWN SPEC L3 XXLG W/TWL (GOWN DISPOSABLE) ×2 IMPLANT
GOWN STRL REUS W/TWL LRG LVL3 (GOWN DISPOSABLE) ×2 IMPLANT
GOWN STRL REUS W/TWL XL LVL3 (GOWN DISPOSABLE) ×1 IMPLANT
HOLDER FOLEY CATH W/STRAP (MISCELLANEOUS) ×2 IMPLANT
KIT BASIN OR (CUSTOM PROCEDURE TRAY) ×2 IMPLANT
PACK TOTAL JOINT (CUSTOM PROCEDURE TRAY) ×2 IMPLANT
SAW OSC TIP CART 19.5X105X1.3 (SAW) ×2 IMPLANT
SUT MNCRL AB 4-0 PS2 18 (SUTURE) ×2 IMPLANT
SUT VIC AB 1 CT1 36 (SUTURE) ×6 IMPLANT
SUT VIC AB 2-0 CT1 27 (SUTURE) ×4
SUT VIC AB 2-0 CT1 TAPERPNT 27 (SUTURE) ×2 IMPLANT
SUT VLOC 180 0 24IN GS25 (SUTURE) ×2 IMPLANT
TOWEL OR 17X26 10 PK STRL BLUE (TOWEL DISPOSABLE) ×2 IMPLANT
TOWEL OR NON WOVEN STRL DISP B (DISPOSABLE) ×1 IMPLANT
TRAY FOLEY CATH 14FRSI W/METER (CATHETERS) ×2 IMPLANT
WATER STERILE IRR 1500ML POUR (IV SOLUTION) ×2 IMPLANT

## 2014-07-17 NOTE — Interval H&P Note (Signed)
History and Physical Interval Note:  07/17/2014 11:32 AM  Fran LowesNancy P Spero GeraldsFriddle  has presented today for surgery, with the diagnosis of LEFT HIP OA  The various methods of treatment have been discussed with the patient and family. After consideration of risks, benefits and other options for treatment, the patient has consented to  Procedure(s): LEFT TOTAL HIP ARTHROPLASTY ANTERIOR APPROACH (Left) as a surgical intervention .  The patient's history has been reviewed, patient examined, no change in status, stable for surgery.  I have reviewed the patient's chart and labs.  Questions were answered to the patient's satisfaction.     Shelda PalLIN,Nakeda Lebron D

## 2014-07-17 NOTE — Transfer of Care (Signed)
Immediate Anesthesia Transfer of Care Note  Patient: [name]  Procedure(s) Performed: Procedure(s) (LRB): LEFT TOTAL HIP ARTHROPLASTY ANTERIOR APPROACH (Left)  Patient Location: PACU  Anesthesia Type: General  Level of Consciousness: sedated, patient cooperative and responds to stimulation  Airway & Oxygen Therapy: Patient Spontanous Breathing and Patient connected to face mask oxgen  Post-op Assessment: Report given to PACU RN and Post -op Vital signs reviewed and stable  Post vital signs: Reviewed and stable  Complications: No apparent anesthesia complications

## 2014-07-17 NOTE — Anesthesia Preprocedure Evaluation (Signed)
Anesthesia Evaluation  Patient identified by MRN, date of birth, ID band Patient awake    Reviewed: Allergy & Precautions, H&P , NPO status , Patient's Chart, lab work & pertinent test results  History of Anesthesia Complications (+) PONV and history of anesthetic complications  Airway Mallampati: II TM Distance: >3 FB Neck ROM: Full    Dental no notable dental hx.    Pulmonary neg pulmonary ROS,  breath sounds clear to auscultation  Pulmonary exam normal       Cardiovascular hypertension, Pt. on medications + CAD Rhythm:Regular Rate:Normal     Neuro/Psych PSYCHIATRIC DISORDERS Depression negative neurological ROS     GI/Hepatic Neg liver ROS, GERD-  Medicated,  Endo/Other  negative endocrine ROS  Renal/GU negative Renal ROS  negative genitourinary   Musculoskeletal  (+) Arthritis -,   Abdominal (+) + obese,   Peds negative pediatric ROS (+)  Hematology negative hematology ROS (+)   Anesthesia Other Findings   Reproductive/Obstetrics negative OB ROS                           Anesthesia Physical Anesthesia Plan  ASA: II  Anesthesia Plan: General   Post-op Pain Management:    Induction: Intravenous  Airway Management Planned: Oral ETT  Additional Equipment:   Intra-op Plan:   Post-operative Plan: Extubation in OR  Informed Consent: I have reviewed the patients History and Physical, chart, labs and discussed the procedure including the risks, benefits and alternatives for the proposed anesthesia with the patient or authorized representative who has indicated his/her understanding and acceptance.   Dental advisory given  Plan Discussed with: CRNA  Anesthesia Plan Comments: (Discussed spinal and general. H/O spinal stenosis, s/p back surgery. Chronic back pain with left foot drop. Plan general. Patient consents.)        Anesthesia Quick Evaluation

## 2014-07-17 NOTE — Progress Notes (Signed)
Portable AP Pelvis and Lateral Left Hip X-rays done. 

## 2014-07-17 NOTE — Op Note (Signed)
NAME:  Fran Lowesancy P Philley                ACCOUNT NO.: [id number]      MEDICAL RECORD NO.: [id number]      [facility]      PHYSICIAN:  Durene RomansLIN,Eldridge Marcott D  DATE OF BIRTH:  01/24/1941     DATE OF PROCEDURE:  07/17/2014                                 OPERATIVE REPORT         PREOPERATIVE DIAGNOSIS: Left  hip osteoarthritis.      POSTOPERATIVE DIAGNOSIS:  Left hip osteoarthritis.      PROCEDURE:  Left total hip replacement through an anterior approach   utilizing DePuy THR system, component size 50mm pinnacle cup, a size 32+4 neutral   Altrex liner, a size 5 Hi Tri Lock stem with a 32+5 Articuleze metal head ball.      SURGEON:  Madlyn FrankelMatthew D. Charlann Boxerlin, M.D.      ASSISTANT:  Lanney GinsMatthew Babish, PA-C      ANESTHESIA:  General.      SPECIMENS:  None.      COMPLICATIONS:  None.      BLOOD LOSS:  500 cc     DRAINS:  None.      INDICATION OF THE PROCEDURE:  Melba Coonancy P Totzke is a 73 y.o. female who had   presented to office for evaluation of left hip pain.  Radiographs revealed   progressive degenerative changes with bone-on-bone   articulation to the  hip joint.  The patient had painful limited range of   motion significantly affecting their overall quality of life.  The patient was failing to    respond to conservative measures, and at this point was ready   to proceed with more definitive measures.  The patient has noted progressive   degenerative changes in his hip, progressive problems and dysfunction   with regarding the hip prior to surgery.  Consent was obtained for   benefit of pain relief.  Specific risk of infection, DVT, component   failure, dislocation, need for revision surgery, as well discussion of   the anterior versus posterior approach were reviewed.  Consent was   obtained for benefit of anterior pain relief through an anterior   approach.      PROCEDURE IN DETAIL:  The patient was brought to operative theater.   Once adequate anesthesia,  preoperative antibiotics, 2gm of Ancef administered.   The patient was positioned supine on the OSI Hanna table.  Once adequate   padding of boney process was carried out, we had predraped out the hip, and  used fluoroscopy to confirm orientation of the pelvis and position.      The left hip was then prepped and draped from proximal iliac crest to   mid thigh with shower curtain technique.      Time-out was performed identifying the patient, planned procedure, and   extremity.     An incision was then made 2 cm distal and lateral to the   anterior superior iliac spine extending over the orientation of the   tensor fascia lata muscle and sharp dissection was carried down to the   fascia of the muscle and protractor placed in the soft tissues.      The fascia was then incised.  The muscle belly was identified and swept  laterally and retractor placed along the superior neck.  Following   cauterization of the circumflex vessels and removing some pericapsular   fat, a second cobra retractor was placed on the inferior neck.  A third   retractor was placed on the anterior acetabulum after elevating the   anterior rectus.  A L-capsulotomy was along the line of the   superior neck to the trochanteric fossa, then extended proximally and   distally.  Tag sutures were placed and the retractors were then placed   intracapsular.  We then identified the trochanteric fossa and   orientation of my neck cut, confirmed this radiographically   and then made a neck osteotomy with the femur on traction.  The femoral   head was removed without difficulty or complication.  Traction was let   off and retractors were placed posterior and anterior around the   acetabulum.      The labrum and foveal tissue were debrided.  I began reaming with a 45mm   reamer and reamed up to 49mm reamer with good bony bed preparation and a 50mm   cup was chosen.  The final 50mm Pinnacle cup was then impacted under fluoroscopy   to confirm the depth of penetration and orientation with respect to   abduction.  A screw was placed followed by the hole eliminator.  The final   32+4 neutral Altrex liner was impacted with good visualized rim fit.  The cup was positioned anatomically within the acetabular portion of the pelvis.      At this point, the femur was rolled at 80 degrees.  Further capsule was   released off the inferior aspect of the femoral neck.  I then   released the superior capsule proximally.  The hook was placed laterally   along the femur and elevated manually and held in position with the bed   hook.  The leg was then extended and adducted with the leg rolled to 100   degrees of external rotation.  Once the proximal femur was fully   exposed, I used a box osteotome to set orientation.  I then began   broaching with the starting chili pepper broach and passed this by hand and then broached up to 5.  With the 5 broach in place I chose a high offset neck and did a trial reduction.  The offset was appropriate, leg lengths   appeared to be equal, confirmed radiographically.   Given these findings, I went ahead and dislocated the hip, repositioned all   retractors and positioned the right hip in the extended and abducted position.  The final 5 Hi Tri Lock stem was   chosen and it was impacted down to the level of neck cut.  Based on this   and the trial reduction, a 32+5 Articuleze metal head ball was chosen and   impacted onto a clean and dry trunnion, and the hip was reduced.  The   hip had been irrigated throughout the case again at this point.  I did   reapproximate the superior capsular leaflet to the anterior leaflet   using #1 Vicryl.  The fascia of the   tensor fascia lata muscle was then reapproximated using #1 Vicryl and #0 V-lock sutures.  The   remaining wound was closed with 2-0 Vicryl and running 4-0 Monocryl.   The hip was cleaned, dried, and dressed sterilely using Dermabond and   Aquacel  dressing.  She was then brought   to recovery  room in stable condition tolerating the procedure well.    Lanney Gins, PA-C was present for the entirety of the case involved from   preoperative positioning, perioperative retractor management, general   facilitation of the case, as well as primary wound closure as assistant.            Madlyn Frankel Charlann Boxer, M.D.        07/17/2014 2:09 PM

## 2014-07-17 NOTE — Progress Notes (Signed)
Utilization review completed.  

## 2014-07-17 NOTE — Anesthesia Postprocedure Evaluation (Signed)
  Anesthesia Post-op Note  Patient: [name]  Procedure(s) Performed: Procedure(s) (LRB): LEFT TOTAL HIP ARTHROPLASTY ANTERIOR APPROACH (Left)  Patient Location: PACU  Anesthesia Type: General  Level of Consciousness: awake and alert   Airway and Oxygen Therapy: Patient Spontanous Breathing  Post-op Pain: mild  Post-op Assessment: Post-op Vital signs reviewed, Patient's Cardiovascular Status Stable, Respiratory Function Stable, Patent Airway and No signs of Nausea or vomiting  Last Vitals:  Filed Vitals:   07/17/14 1603  BP: 122/50  Pulse: 62  Temp: 36.7 C  Resp: 12    Post-op Vital Signs: stable   Complications: No apparent anesthesia complications

## 2014-07-18 ENCOUNTER — Encounter (HOSPITAL_COMMUNITY): Payer: Self-pay | Admitting: Orthopedic Surgery

## 2014-07-18 LAB — CBC
HEMATOCRIT: 31.5 % — AB (ref 36.0–46.0)
Hemoglobin: 10.4 g/dL — ABNORMAL LOW (ref 12.0–15.0)
MCH: 31 pg (ref 26.0–34.0)
MCHC: 33 g/dL (ref 30.0–36.0)
MCV: 93.8 fL (ref 78.0–100.0)
Platelets: 161 10*3/uL (ref 150–400)
RBC: 3.36 MIL/uL — AB (ref 3.87–5.11)
RDW: 12.7 % (ref 11.5–15.5)
WBC: 9.8 10*3/uL (ref 4.0–10.5)

## 2014-07-18 LAB — BASIC METABOLIC PANEL
Anion gap: 9 (ref 5–15)
BUN: 13 mg/dL (ref 6–23)
CHLORIDE: 107 meq/L (ref 96–112)
CO2: 26 meq/L (ref 19–32)
Calcium: 8.6 mg/dL (ref 8.4–10.5)
Creatinine, Ser: 0.71 mg/dL (ref 0.50–1.10)
GFR calc Af Amer: >90 mL/min (ref >90)
GFR calc non Af Amer: 84 mL/min — ABNORMAL LOW (ref >90)
Glucose, Bld: 107 mg/dL — ABNORMAL HIGH (ref 70–99)
POTASSIUM: 4.7 meq/L (ref 3.7–5.3)
Sodium: 142 mEq/L (ref 137–147)

## 2014-07-18 NOTE — Progress Notes (Signed)
Patient ID: [name], female   DOB: 10/09/1940, 73 y.o.   MRN: [id number] Subjective: 1 Day Post-Op Procedure(s) (LRB): LEFT TOTAL HIP ARTHROPLASTY ANTERIOR APPROACH (Left)    Patient reports pain as mild.  Sleeping this am, no events, relatively comfortable  Objective:   VITALS:   Filed Vitals:   07/18/14 0531  BP: 100/70  Pulse: 60  Temp: 97.7 F (36.5 C)  Resp: 16    Neurovascular intact Incision: dressing C/D/I  LABS  Recent Labs  07/18/14 0503  HGB 10.4*  HCT 31.5*  WBC 9.8  PLT 161     Recent Labs  07/18/14 0503  NA 142  K 4.7  BUN 13  CREATININE 0.71  GLUCOSE 107*    No results found for this basename: LABPT, INR,  in the last 72 hours   Assessment/Plan: 1 Day Post-Op Procedure(s) (LRB): LEFT TOTAL HIP ARTHROPLASTY ANTERIOR APPROACH (Left)   Advance diet Up with therapy  Discharge plan originally for SNF but I want to and have asked her to see how she progresses with goal of trying to go home instead tomorrow versus Thursday

## 2014-07-18 NOTE — Progress Notes (Signed)
Physical Therapy Treatment Patient Details Name: [name] MRN: [id number] DOB: 05/18/1941 Today's Date: 07/18/2014    History of Present Illness L THR    PT Comments    Marked improvement in activity tolerance vs this am.  Follow Up Recommendations  Home health PT;SNF     Equipment Recommendations  Rolling walker with 5" wheels    Recommendations for Other Services OT consult     Precautions / Restrictions Precautions Precautions: Fall Restrictions Weight Bearing Restrictions: No LLE Weight Bearing: Weight bearing as tolerated    Mobility  Bed Mobility Overal bed mobility: Needs Assistance Bed Mobility: Supine to Sit;Sit to Supine     Supine to sit: Min assist Sit to supine: Min assist   General bed mobility comments: cues for sequence and use of R LE to self assist  Transfers Overall transfer level: Needs assistance Equipment used: Rolling walker (2 wheeled) Transfers: Sit to/from Stand Sit to Stand: Min assist         General transfer comment: verbal cues for hand placement  Ambulation/Gait Ambulation/Gait assistance: Min assist Ambulation Distance (Feet): 48 Feet (and 15' twice to/from bathroom) Assistive device: Rolling walker (2 wheeled) Gait Pattern/deviations: Step-to pattern;Decreased step length - right;Decreased step length - left;Shuffle Gait velocity: decr   General Gait Details: cues for sequence, posture and position from [employer]   Stairs            Wheelchair Mobility    Modified Rankin (Stroke Patients Only)       Balance                                    Cognition Arousal/Alertness: Awake/alert Behavior During Therapy: WFL for tasks assessed/performed Overall Cognitive Status: Within Functional Limits for tasks assessed                      Exercises      General Comments        Pertinent Vitals/Pain Pain Assessment: 0-10 Pain Score: 5  Pain Location: L hip Pain Descriptors /  Indicators: Aching;Burning Pain Intervention(s): Limited activity within patient's tolerance;Monitored during session;Ice applied    Home Living                      Prior Function            PT Goals (current goals can now be found in the care plan section) Acute Rehab PT Goals Patient Stated Goal: Resume previous lifestyle with decreased pain PT Goal Formulation: With patient Time For Goal Achievement: 07/25/14 Potential to Achieve Goals: Good Progress towards PT goals: Progressing toward goals    Frequency  7X/week    PT Plan Current plan remains appropriate    Co-evaluation             End of Session Equipment Utilized During Treatment: Gait belt Activity Tolerance: Patient tolerated treatment well Patient left: in bed;with call bell/phone within reach     Time: 1430-1509 PT Time Calculation (min): 39 min  Charges:  $Gait Training: 23-37 mins $Therapeutic Activity: 8-22 mins                    G Codes:      Solae Norling 07/18/2014, 5:27 PM

## 2014-07-18 NOTE — Evaluation (Signed)
Occupational Therapy Evaluation Patient Details Name: [name] P Stange MRN: [id number] DOB: 05/02/1941 Today's Date: 07/18/2014    History of Present Illness L THR   Clinical Impression  Pt is s/p THA resulting in the deficits listed below (see OT Problem List).  Pt will benefit from skilled OT to increase their safety and independence with ADL and functional mobility for ADL to facilitate discharge to venue listed below.        Follow Up Recommendations  SNF;Home health OT    Equipment Recommendations  3 in 1 bedside comode    Recommendations for Other Services       Precautions / Restrictions Precautions Precautions: Fall Restrictions Weight Bearing Restrictions: No LLE Weight Bearing: Weight bearing as tolerated      Mobility Bed Mobility Overal bed mobility: Needs Assistance Bed Mobility: Supine to Sit     Supine to sit: Mod assist     General bed mobility comments: pt in chair  Transfers Overall transfer level: Needs assistance Equipment used: Rolling walker (2 wheeled) Transfers: Sit to/from Stand Sit to Stand: Min assist         General transfer comment: verbal cues for hand placement    Balance                                            ADL Overall ADL's : Needs assistance/impaired     Grooming: Set up;Sitting   Upper Body Bathing: Set up;Sitting   Lower Body Bathing: Moderate assistance;Sit to/from stand   Upper Body Dressing : Set up;Sitting   Lower Body Dressing: Sit to/from stand;Moderate assistance                 General ADL Comments: verbal cues for hand placement. Pt needed less A each sit to stand     Vision                            Pertinent Vitals/Pain Pain Assessment: 0-10 Pain Score: 6  Pain Location: L hip Pain Descriptors / Indicators: Sore Pain Intervention(s): Monitored during session;Premedicated before session;Repositioned     Hand Dominance     Extremity/Trunk  Assessment Upper Extremity Assessment Upper Extremity Assessment: Generalized weakness   Lower Extremity Assessment Lower Extremity Assessment: LLE deficits/detail LLE Deficits / Details: 2+/5 hip strength with AAROM at hip to 80 flex and 15 abd       Communication Communication Communication: No difficulties   Cognition Arousal/Alertness: Awake/alert Behavior During Therapy: WFL for tasks assessed/performed Overall Cognitive Status: Within Functional Limits for tasks assessed                        Exercises Exercises: Total Joint          Home Living Family/patient expects to be discharged to:: Private residence Living Arrangements: Spouse/significant other Available Help at Discharge: Family Type of Home: House Home Access: Stairs to enter Secretary/administratorntrance Stairs-Number of Steps: 1 Entrance Stairs-Rails: None Home Layout: One level     Bathroom Shower/Tub: Chief Strategy OfficerTub/shower unit   Bathroom Toilet: Standard         Additional Comments: Pt is home with husband who can not physcially assist 2* cardiac issues.  Dtr is available evenings      Prior Functioning/Environment Level of Independence: Independent  OT Diagnosis: Generalized weakness   OT Problem List: Decreased strength;Decreased activity tolerance;Pain   OT Treatment/Interventions: Self-care/ADL training;Patient/family education;DME and/or AE instruction    OT Goals(Current goals can be found in the care plan section) Acute Rehab OT Goals Patient Stated Goal: Resume previous lifestyle with decreased pain OT Goal Formulation: With patient ADL Goals Pt Will Perform Lower Body Dressing: with supervision;sit to/from stand Pt Will Transfer to Toilet: with supervision;ambulating;regular height toilet Pt Will Perform Toileting - Clothing Manipulation and hygiene: with supervision;sit to/from stand Pt Will Perform Tub/Shower Transfer: with supervision;tub bench;rolling walker  OT Frequency: Min  2X/week   Barriers to D/C:               End of Session Equipment Utilized During Treatment: Rolling walker Nurse Communication: Mobility status  Activity Tolerance: Patient tolerated treatment well Patient left: in chair;with call bell/phone within reach   Time: 1233-1249 OT Time Calculation (min): 16 min Charges:  OT General Charges $OT Visit: 1 Procedure OT Evaluation $Initial OT Evaluation Tier I: 1 Procedure OT Treatments $Self Care/Home Management : 8-22 mins G-Codes:    Einar CrowEDDING, Mailynn Everly D 07/18/2014, 1:00 PM

## 2014-07-18 NOTE — Care Management Note (Addendum)
    Page 1 of 2   07/20/2014     12:22:57 PM CARE MANAGEMENT NOTE 07/20/2014  Patient:  [name], [name]   Account Number:  [id number]  Date Initiated:  07/18/2014  Documentation initiated by:  [facility]  Subjective/Objective Assessment:   adm: LEFT TOTAL HIP ARTHROPLASTY ANTERIOR APPROACH (Left)     Action/Plan:   SNF vs [place]  discharge planning   Anticipated DC Date:  07/20/2014   Anticipated DC Plan:  [place]  CM consult      [facility] Choice  HOME HEALTH   Choice offered to / List presented to:  C-1 Patient   DME arranged  3-N-1  Vassie Moselle      DME agency  [place] arranged  [place]   Status of service:  Completed, signed off Medicare Important Message given?  YES (If response is "NO", the following Medicare IM given date fields will be blank) Date Medicare IM given:  07/20/2014 Medicare IM given by:  [facility] Date Additional Medicare IM given:   Additional Medicare IM given by:    Discharge Disposition:  [place]  Per UR Regulation:  Reviewed for med. necessity/level of care/duration of stay  If discussed at [place] of Stay Meetings, dates discussed:    Comments:  07/20/14 Kehinde Totzke RN,BSN NCM [place] ORDER,& D/C TODAY.[facility] DME REP HAS ALREADY BROUGHT 3N1 INTO RM.NO FURTHER D/C NEEDS.  07/18/14 10;00 Cm met with pt in room to offer choice of home health agency.  Pt chooses [facility] to render HHPT. Address and contact information verified with pt.  Pt states she has a rolling walker she is going to borrow but needs a 3n1.  Referral called to [facility] for HHPT.  CM called DME rep, Lecretia to please deliver 3n1 prior to discharge.  No other CM needs were communicated. Mariane Masters, BSN, CM [phone].

## 2014-07-18 NOTE — Evaluation (Signed)
Physical Therapy Evaluation Patient Details Name: [name] MRN: [id number] DOB: 07/09/1941 Today's Date: 07/18/2014   History of Present Illness  L THR  Clinical Impression  Pt s/p L THR presents with decreased L LE strength/ROM and post op pain limiting functional mobility.  Pt is currently mod assist to mobilize and reports ltd assist at home as her spouse can not physically help her.  Dependent on acute stay progress, pt may require follow up rehab at SNF level prior to return home.    Follow Up Recommendations Home health PT;SNF (dependent on acute stay progress)    Equipment Recommendations  Rolling walker with 5" wheels    Recommendations for Other Services OT consult     Precautions / Restrictions Precautions Precautions: Fall Restrictions Weight Bearing Restrictions: No LLE Weight Bearing: Weight bearing as tolerated      Mobility  Bed Mobility Overal bed mobility: Needs Assistance Bed Mobility: Supine to Sit     Supine to sit: Mod assist     General bed mobility comments: cues for sequence and use of R LE to self assist.  Physical assist for L LE and to bring trunk to upright  Transfers Overall transfer level: Needs assistance Equipment used: Rolling walker (2 wheeled) Transfers: Sit to/from Stand Sit to Stand: Mod assist         General transfer comment: cues for LE management and use of UEs to self assist  Ambulation/Gait Ambulation/Gait assistance: Min assist;Mod assist Ambulation Distance (Feet): 28 Feet Assistive device: Rolling walker (2 wheeled) Gait Pattern/deviations: Step-to pattern;Decreased step length - right;Decreased step length - left;Shuffle;Antalgic Gait velocity: decr   General Gait Details: cues for sequence, posture and position from [employer]  Stairs            Wheelchair Mobility    Modified Rankin (Stroke Patients Only)       Balance                                             Pertinent  Vitals/Pain Pain Assessment: 0-10 Pain Score: 6  Pain Location: L hip Pain Descriptors / Indicators: Sore Pain Intervention(s): Limited activity within patient's tolerance;Monitored during session;Premedicated before session;Ice applied    Home Living Family/patient expects to be discharged to:: Private residence Living Arrangements: Spouse/significant other Available Help at Discharge: Family Type of Home: House Home Access: Stairs to enter Entrance Stairs-Rails: None Entrance Stairs-Number of Steps: 1 Home Layout: One level   Additional Comments: Pt is home with husband who can not physcially assist 2* cardiac issues.  Dtr is available evenings    Prior Function Level of Independence: Independent               Hand Dominance        Extremity/Trunk Assessment   Upper Extremity Assessment: Overall WFL for tasks assessed           Lower Extremity Assessment: LLE deficits/detail   LLE Deficits / Details: 2+/5 hip strength with AAROM at hip to 80 flex and 15 abd     Communication   Communication: No difficulties  Cognition Arousal/Alertness: Awake/alert Behavior During Therapy: WFL for tasks assessed/performed Overall Cognitive Status: Within Functional Limits for tasks assessed                      General Comments      Exercises Total  Joint Exercises Ankle Circles/Pumps: AROM;Both;15 reps;Supine Quad Sets: AROM;Both;10 reps;Supine Heel Slides: AAROM;15 reps;Supine;Left Straight Leg Raises: AAROM;10 reps;Supine;Left      Assessment/Plan    PT Assessment Patient needs continued PT services  PT Diagnosis Difficulty walking   PT Problem List Decreased strength;Decreased range of motion;Decreased activity tolerance;Decreased mobility;Decreased knowledge of use of DME  PT Treatment Interventions DME instruction;Gait training;Stair training;Functional mobility training;Therapeutic activities;Therapeutic exercise;Patient/family education   PT  Goals (Current goals can be found in the Care Plan section) Acute Rehab PT Goals Patient Stated Goal: Resume previous lifestyle with decreased pain PT Goal Formulation: With patient Time For Goal Achievement: 07/25/14 Potential to Achieve Goals: Good    Frequency 7X/week   Barriers to discharge Decreased caregiver support Pt's spouse can not physically assist 2* cardiac issues.  Dtr is available evenings    Co-evaluation               End of Session Equipment Utilized During Treatment: Gait belt Activity Tolerance: Patient tolerated treatment well Patient left: in chair;with call bell/phone within reach Nurse Communication: Mobility status         Time: 1025-1055 PT Time Calculation (min): 30 min   Charges:   PT Evaluation $Initial PT Evaluation Tier I: 1 Procedure PT Treatments $Gait Training: 8-22 mins $Therapeutic Exercise: 8-22 mins   PT G Codes:          Cortana Vanderford 07/18/2014, 12:30 PM

## 2014-07-19 LAB — BASIC METABOLIC PANEL
Anion gap: 9 (ref 5–15)
BUN: 21 mg/dL (ref 6–23)
CALCIUM: 9.1 mg/dL (ref 8.4–10.5)
CO2: 26 meq/L (ref 19–32)
Chloride: 107 mEq/L (ref 96–112)
Creatinine, Ser: 0.88 mg/dL (ref 0.50–1.10)
GFR calc Af Amer: 74 mL/min — ABNORMAL LOW (ref >90)
GFR calc non Af Amer: 64 mL/min — ABNORMAL LOW (ref >90)
GLUCOSE: 127 mg/dL — AB (ref 70–99)
Potassium: 4.2 mEq/L (ref 3.7–5.3)
Sodium: 142 mEq/L (ref 137–147)

## 2014-07-19 LAB — CBC
HCT: 29.4 % — ABNORMAL LOW (ref 36.0–46.0)
HEMOGLOBIN: 9.6 g/dL — AB (ref 12.0–15.0)
MCH: 31 pg (ref 26.0–34.0)
MCHC: 32.7 g/dL (ref 30.0–36.0)
MCV: 94.8 fL (ref 78.0–100.0)
Platelets: 163 10*3/uL (ref 150–400)
RBC: 3.1 MIL/uL — AB (ref 3.87–5.11)
RDW: 13.1 % (ref 11.5–15.5)
WBC: 10.4 10*3/uL (ref 4.0–10.5)

## 2014-07-19 MED ORDER — POLYETHYLENE GLYCOL 3350 17 G PO PACK: 17.0000 g | PACK | Freq: Two times a day (BID) | ORAL | Status: DC

## 2014-07-19 MED ORDER — DSS 100 MG PO CAPS: 100.0000 mg | ORAL_CAPSULE | Freq: Two times a day (BID) | ORAL | Status: DC

## 2014-07-19 MED ORDER — ASPIRIN 325 MG PO TBEC: 325.0000 mg | DELAYED_RELEASE_TABLET | Freq: Two times a day (BID) | ORAL | Status: DC

## 2014-07-19 MED ORDER — TIZANIDINE HCL 4 MG PO TABS: 4.0000 mg | ORAL_TABLET | Freq: Four times a day (QID) | ORAL | Status: DC | PRN

## 2014-07-19 MED ORDER — HYDROCODONE-ACETAMINOPHEN 7.5-325 MG PO TABS: 1.0000 | ORAL_TABLET | ORAL | Status: DC | PRN

## 2014-07-19 MED ORDER — FERROUS SULFATE 325 (65 FE) MG PO TABS: 325.0000 mg | ORAL_TABLET | Freq: Three times a day (TID) | ORAL | Status: DC

## 2014-07-19 NOTE — Progress Notes (Signed)
Physical Therapy Treatment Patient Details Name: Melba Coonancy P Wing MRN: [id number] DOB: 10/02/1940 Today's Date: 07/19/2014    History of Present Illness L THR    PT Comments    Progressing well, increased time for most tasks with c/o increased generalized soreness this pm  Follow Up Recommendations  Home health PT;SNF     Equipment Recommendations  Rolling walker with 5" wheels    Recommendations for Other Services OT consult     Precautions / Restrictions Precautions Precautions: Fall Restrictions Weight Bearing Restrictions: No LLE Weight Bearing: Weight bearing as tolerated    Mobility  Bed Mobility Overal bed mobility: Needs Assistance Bed Mobility: Sit to Supine;Supine to Sit     Supine to sit: Min guard Sit to supine: Supervision   General bed mobility comments: cues for sequence and use of R LE to self assist  Transfers Overall transfer level: Needs assistance Equipment used: Rolling walker (2 wheeled) Transfers: Sit to/from Stand Sit to Stand: Supervision Stand pivot transfers: Supervision       General transfer comment: verbal cues for hand placement  Ambulation/Gait Ambulation/Gait assistance: Min guard;Supervision Ambulation Distance (Feet): 100 Feet Assistive device: Rolling walker (2 wheeled) Gait Pattern/deviations: Step-to pattern;Step-through pattern;Decreased step length - right;Decreased step length - left;Shuffle;Trunk flexed Gait velocity: decr   General Gait Details: cues for posture, position from RW and initial sequence.   Stairs            Wheelchair Mobility    Modified Rankin (Stroke Patients Only)       Balance                                    Cognition Arousal/Alertness: Awake/alert Behavior During Therapy: WFL for tasks assessed/performed Overall Cognitive Status: Within Functional Limits for tasks assessed                      Exercises      General Comments         Pertinent Vitals/Pain Pain Assessment: 0-10 Pain Score: 5  Pain Location: L hip Pain Descriptors / Indicators: Sore Pain Intervention(s): Limited activity within patient's tolerance;Monitored during session;Premedicated before session;Ice applied    Home Living Family/patient expects to be discharged to:: Private residence Living Arrangements: Spouse/significant other Available Help at Discharge: Family Type of Home: House Home Access: Stairs to enter Entrance Stairs-Rails: None Home Layout: One level   Additional Comments: Pt is home with husband who can not physcially assist 2* cardiac issues.  Dtr is available evenings    Prior Function Level of Independence: Independent          PT Goals (current goals can now be found in the care plan section) Acute Rehab PT Goals Patient Stated Goal: Resume previous lifestyle with decreased pain PT Goal Formulation: With patient Time For Goal Achievement: 07/25/14 Potential to Achieve Goals: Good Progress towards PT goals: Progressing toward goals    Frequency  7X/week    PT Plan Current plan remains appropriate    Co-evaluation             End of Session Equipment Utilized During Treatment: Gait belt Activity Tolerance: Patient tolerated treatment well Patient left: in bed;with call bell/phone within reach;with family/visitor present     Time: 1416-1440 PT Time Calculation (min): 24 min  Charges:  $Gait Training: 23-37 mins  G Codes:      Ravina Milner 07/19/2014, 2:42 PM

## 2014-07-19 NOTE — Progress Notes (Signed)
Occupational Therapy Treatment Patient Details Name: Melba Coonancy P Leppla MRN: [id number] DOB: 07/13/1941 Today's Date: 07/19/2014    History of present illness L THR   OT comments   pt ready to go home this day.    Follow Up Recommendations  Home health OT          Precautions / Restrictions Precautions Precautions: Fall Restrictions Weight Bearing Restrictions: No LLE Weight Bearing: Weight bearing as tolerated       Mobility Bed Mobility Overal bed mobility: Needs Assistance Bed Mobility: Sit to Supine     Supine to sit: Min guard Sit to supine: Supervision   General bed mobility comments: cues for sequence and use of R LE to self assist  Transfers Overall transfer level: Needs assistance Equipment used: Rolling walker (2 wheeled) Transfers: Sit to/from UGI CorporationStand;Stand Pivot Transfers Sit to Stand: Supervision Stand pivot transfers: Supervision       General transfer comment: verbal cues for hand placement        ADL       Grooming: Standing;Supervision/safety   Upper Body Bathing: Set up;Sitting   Lower Body Bathing: Minimal assistance;Sit to/from stand           Toilet Transfer: Supervision/safety;RW;Ambulation;Comfort height toilet Toilet Transfer Details (indicate cue type and reason): increased time needed Toileting- Clothing Manipulation and Hygiene: Supervision/safety;Sit to/from stand         General ADL Comments: pt feels like she is ready to go home this day - but wants to work with PT one more time.                Cognition   Behavior During Therapy: WFL for tasks assessed/performed Overall Cognitive Status: Within Functional Limits for tasks assessed                         Exercises Total Joint Exercises Ankle Circles/Pumps: AROM;Both;15 reps;Supine Quad Sets: AROM;Both;10 reps;Supine Gluteal Sets: AROM;Both;10 reps;Supine Heel Slides: AAROM;Supine;Left;20 reps Hip ABduction/ADduction: AAROM;Left;Supine;15 reps       General Comments  daughter will A as needed    Pertinent Vitals/ Pain       Pain Assessment: 0-10 Pain Score: 3  Pain Location: l hip Pain Descriptors / Indicators: Sore Pain Intervention(s): Monitored during session;Repositioned  Home Living Family/patient expects to be discharged to:: Private residence Living Arrangements: Spouse/significant other Available Help at Discharge: Family Type of Home: House Home Access: Stairs to enter Secretary/administratorntrance Stairs-Number of Steps: 1 Entrance Stairs-Rails: None Home Layout: One level     Bathroom Shower/Tub: Chief Strategy OfficerTub/shower unit   Bathroom Toilet: Standard         Additional Comments: Pt is home with husband who can not physcially assist 2* cardiac issues.  Dtr is available evenings      Prior Functioning/Environment Level of Independence: Independent               Progress Toward Goals  OT Goals(current goals can now be found in the care plan section)  Progress towards OT goals: Progressing toward goals  Acute Rehab OT Goals Patient Stated Goal: Resume previous lifestyle with decreased pain  Plan Discharge plan needs to be updated       End of Session Equipment Utilized During Treatment: Rolling walker   Activity Tolerance Patient tolerated treatment well   Patient Left in bed;with call bell/phone within reach;with family/visitor present   Nurse Communication Mobility status        Time: 1255-1318 OT Time Calculation (min): 23  min  Charges: OT General Charges $OT Visit: 1 Procedure OT Treatments $Self Care/Home Management : 23-37 mins  Toben Acuna, Metro KungLorraine D 07/19/2014, 1:45 PM

## 2014-07-19 NOTE — Progress Notes (Signed)
Physical Therapy Treatment Patient Details Name: [name] MRN: [id number] DOB: 04/18/1941 Today's Date: 07/19/2014    History of Present Illness L THR    PT Comments    Steady progress with pt feeling more confident re: home rather than SNF.  Pt's spouse and dtr present for half session.  Follow Up Recommendations  Home health PT;SNF     Equipment Recommendations  Rolling walker with 5" wheels    Recommendations for Other Services OT consult     Precautions / Restrictions Precautions Precautions: Fall Restrictions Weight Bearing Restrictions: No LLE Weight Bearing: Weight bearing as tolerated    Mobility  Bed Mobility Overal bed mobility: Needs Assistance Bed Mobility: Supine to Sit     Supine to sit: Min guard     General bed mobility comments: cues for sequence and use of R LE to self assist  Transfers Overall transfer level: Needs assistance Equipment used: Rolling walker (2 wheeled) Transfers: Sit to/from Stand Sit to Stand: Min assist         General transfer comment: verbal cues for hand placement  Ambulation/Gait Ambulation/Gait assistance: Min assist;Min guard Ambulation Distance (Feet): 98 Feet Assistive device: Rolling walker (2 wheeled) Gait Pattern/deviations: Step-to pattern;Decreased step length - right;Decreased step length - left;Shuffle;Trunk flexed Gait velocity: decr   General Gait Details: cues for sequence, posture and position from Rohm and HaasW   Stairs            Wheelchair Mobility    Modified Rankin (Stroke Patients Only)       Balance                                    Cognition Arousal/Alertness: Awake/alert Behavior During Therapy: WFL for tasks assessed/performed Overall Cognitive Status: Within Functional Limits for tasks assessed                      Exercises Total Joint Exercises Ankle Circles/Pumps: AROM;Both;15 reps;Supine Quad Sets: AROM;Both;10 reps;Supine Gluteal Sets:  AROM;Both;10 reps;Supine Heel Slides: AAROM;Supine;Left;20 reps Hip ABduction/ADduction: AAROM;Left;Supine;15 reps    General Comments        Pertinent Vitals/Pain Pain Assessment: 0-10 Pain Score: 6  Pain Location: L hip Pain Descriptors / Indicators: Aching Pain Intervention(s): Monitored during session;Limited activity within patient's tolerance;Premedicated before session;Ice applied    Home Living                      Prior Function            PT Goals (current goals can now be found in the care plan section) Acute Rehab PT Goals Patient Stated Goal: Resume previous lifestyle with decreased pain PT Goal Formulation: With patient Time For Goal Achievement: 07/25/14 Potential to Achieve Goals: Good Progress towards PT goals: Progressing toward goals    Frequency  7X/week    PT Plan Current plan remains appropriate    Co-evaluation             End of Session Equipment Utilized During Treatment: Gait belt Activity Tolerance: Patient tolerated treatment well Patient left: in chair;with call bell/phone within reach;with family/visitor present     Time: [id number] PT Time Calculation (min): 35 min  Charges:  $Gait Training: 8-22 mins $Therapeutic Exercise: 8-22 mins                    G Codes:  Nick Armel 07/19/2014, 12:24 PM

## 2014-07-19 NOTE — Progress Notes (Signed)
Clinical Social Work Department BRIEF PSYCHOSOCIAL ASSESSMENT 07/19/2014  Patient:  [name], [name]     Account Number:  [id number]     Admit date:  07/17/2014  Clinical Social Worker:  Earlie Server  Date/Time:  07/19/2014 11:15 AM  Referred by:  Physician  Date Referred:  07/19/2014 Referred for  SNF Placement   Other Referral:   Interview type:  Patient Other interview type:    PSYCHOSOCIAL DATA Living Status:  FAMILY Admitted from facility:   Level of care:   Primary support name:  Donnie Primary support relationship to patient:  SPOUSE Degree of support available:   Strong    CURRENT CONCERNS Current Concerns  Post-Acute Placement   Other Concerns:    SOCIAL WORK ASSESSMENT / PLAN Patient was discussed during progression meeting and RN reports that patient is deciding between [facility] vs SNF. CSW reviewed chart and spoke with PT prior to meeting with patient. CSW went to room and met with patient, husband and dtr at bedside. Patient agreeable to family involvement.    Patient reports that she lives at home with husband and dtr. Patient reports this was a planned surgery and prior to surgery she had toured [facility] in order to be prepared if SNF was needed. Patient reports that her surgery went well and she is feeling better now and does not feel that SNF is needed. Patient's husband and dtr at bedside and report they will assist patient at DC if she returns home. Patient aware of [facility] services and reports that her dtr has had services in the past. CSW explained CSW role and assistance with SNF if patient changed her mind.    Per chart review, CM has met with patient and family and has arranged [facility] services. CSW is signing off but encouraged patient and family to contact CSW if further needs arise.   Assessment/plan status:  No Further Intervention Required Other assessment/ plan:   Information/referral to community resources:   SNF information    PATIENT'S/FAMILY'S  RESPONSE TO PLAN OF CARE: Patient alert and oriented and engaged in assessment. Patient reports that she is doing much better than anticipated after surgery and is happy that she has family support at home. Patient reports she was nervous about DC plans prior to surgery but now feels confident to return home. Patient thanked CSW for visit but reports no further concerns. Family present and agrees with patient's decision to return home as well.       [place], [place] [phone]

## 2014-07-19 NOTE — Progress Notes (Signed)
     Subjective: 2 Days Post-Op Procedure(s) (LRB): LEFT TOTAL HIP ARTHROPLASTY ANTERIOR APPROACH (Left)   Seen by Dr. Charlann Boxerlin. Patient reports pain as mild, pain controlled. No events throughout the night. Ready to be discharged home if she does well with PT.   Objective:   VITALS:   Filed Vitals:   07/19/14  BP: 120/54  Pulse: 64  Temp: 98.3 F (36.8 C)   Resp: 18    Dorsiflexion/Plantar flexion intact Incision: dressing C/D/I No cellulitis present Compartment soft  LABS  Recent Labs  07/18/14 0503 07/19/14 0501  HGB 10.4* 9.6*  HCT 31.5* 29.4*  WBC 9.8 10.4  PLT 161 163     Recent Labs  07/18/14 0503 07/19/14 0501  NA 142 142  K 4.7 4.2  BUN 13 21  CREATININE 0.71 0.88  GLUCOSE 107* 127*     Assessment/Plan: 2 Days Post-Op Procedure(s) (LRB): LEFT TOTAL HIP ARTHROPLASTY ANTERIOR APPROACH (Left) Up with therapy Discharge home with home health [facility] Follow up with OLIN,Jashira Cotugno D in 2 weeks.  Contact information:  [facility] [address], [address] [place] [id number] [phone]        Anastasio AuerbachMatthew S. Anselm Aumiller   PAC  07/19/2014, 9:06 AM

## 2014-07-20 NOTE — Progress Notes (Signed)
Physical Therapy Treatment Patient Details Name: [name] MRN: [id number] DOB: 07/27/1941 Today's Date: 07/20/2014    History of Present Illness L THR    PT Comments    Progressing well with increased confidence in ability.  Reviewed car transfers and stairs with pt.  Follow Up Recommendations  Home health PT;SNF     Equipment Recommendations  Rolling walker with 5" wheels    Recommendations for Other Services OT consult     Precautions / Restrictions Precautions Precautions: Fall Restrictions Weight Bearing Restrictions: No LLE Weight Bearing: Weight bearing as tolerated    Mobility  Bed Mobility Overal bed mobility: Needs Assistance Bed Mobility: Sit to Supine;Supine to Sit     Supine to sit: Supervision Sit to supine: Supervision   General bed mobility comments: min cues  Transfers Overall transfer level: Needs assistance Equipment used: Rolling walker (2 wheeled) Transfers: Sit to/from Stand Sit to Stand: Supervision Stand pivot transfers: Supervision       General transfer comment: verbal cues for hand placement  Ambulation/Gait Ambulation/Gait assistance: Min guard;Supervision Ambulation Distance (Feet): 147 Feet Assistive device: Rolling walker (2 wheeled) Gait Pattern/deviations: Step-to pattern;Shuffle;Step-through pattern;Decreased step length - right;Decreased step length - left;Trunk flexed Gait velocity: decr   General Gait Details: cues for posture, position from RW and initial sequence.   Stairs Stairs: Yes Stairs assistance: Min assist Stair Management: No rails;Forwards;With walker;Step to pattern Number of Stairs: 1 (twice) General stair comments: cues for sequence and foot/RW placement  Wheelchair Mobility    Modified Rankin (Stroke Patients Only)       Balance                                    Cognition Arousal/Alertness: Awake/alert Behavior During Therapy: WFL for tasks  assessed/performed Overall Cognitive Status: Within Functional Limits for tasks assessed                      Exercises Total Joint Exercises Ankle Circles/Pumps: AROM;Both;15 reps;Supine Quad Sets: AROM;Both;10 reps;Supine Gluteal Sets: AROM;Both;10 reps;Supine Heel Slides: AAROM;Supine;Left;20 reps Hip ABduction/ADduction: AAROM;Left;Supine;15 reps    General Comments        Pertinent Vitals/Pain Pain Assessment: 0-10 Pain Score: 4  Pain Location: L hip Pain Descriptors / Indicators: Aching;Sore Pain Intervention(s): Limited activity within patient's tolerance;Monitored during session;Premedicated before session;Ice applied    Home Living                      Prior Function            PT Goals (current goals can now be found in the care plan section) Acute Rehab PT Goals Patient Stated Goal: Resume previous lifestyle with decreased pain PT Goal Formulation: With patient Time For Goal Achievement: 07/25/14 Potential to Achieve Goals: Good Progress towards PT goals: Progressing toward goals    Frequency  7X/week    PT Plan Current plan remains appropriate    Co-evaluation             End of Session Equipment Utilized During Treatment: Gait belt Activity Tolerance: Patient tolerated treatment well Patient left: in chair;with call bell/phone within reach     Time: 0833-0920 PT Time Calculation (min): 47 min  Charges:  $Gait Training: 8-22 mins $Therapeutic Exercise: 8-22 mins $Therapeutic Activity: 8-22 mins  G Codes:      Sirius Woodford 07/20/2014, 11:39 AM

## 2014-07-20 NOTE — Progress Notes (Signed)
Patient ID: [name], female   DOB: 01/30/1941, 73 y.o.   MRN: [id number] Subjective: 3 Days Post-Op Procedure(s) (LRB): LEFT TOTAL HIP ARTHROPLASTY ANTERIOR APPROACH (Left)    Patient reports pain as mild.  Some thigh soreness.  Ready to go home versus SNF  Objective:   VITALS:   Filed Vitals:   07/20/14 0617  BP: 133/62  Pulse: 55  Temp: 97.6 F (36.4 C)  Resp: 18    Neurovascular intact Incision: dressing C/D/I  LABS  Recent Labs  07/18/14 0503 07/19/14 0501  HGB 10.4* 9.6*  HCT 31.5* 29.4*  WBC 9.8 10.4  PLT 161 163     Recent Labs  07/18/14 0503 07/19/14 0501  NA 142 142  K 4.7 4.2  BUN 13 21  CREATININE 0.71 0.88  GLUCOSE 107* 127*    No results found for this basename: LABPT, INR,  in the last 72 hours   Assessment/Plan: 3 Days Post-Op Procedure(s) (LRB): LEFT TOTAL HIP ARTHROPLASTY ANTERIOR APPROACH (Left)   Up with therapy Discharge home with home health today  HHPT RTC in 2 weeks for wound check

## 2014-07-21 NOTE — Discharge Summary (Signed)
Physician Discharge Summary  Patient ID: [name] MRN: [id number] DOB/AGE: 72/11/1940 72 y.o.  Admit date: 07/17/2014 Discharge date: 07/20/2014   Procedures:  Procedure(s) (LRB): LEFT TOTAL HIP ARTHROPLASTY ANTERIOR APPROACH (Left)  Attending Physician:  Dr. Durene RomansMatthew [name]   Admission Diagnoses:   Left hip OA / pain  Discharge Diagnoses:  Principal Problem:   S/P left THA, AA  Past Medical History  Diagnosis Date  . Hypertension   . Hyperlipidemia   . Chest pain   . Coronary artery disease     NONOBSTRUCTIVE  . Depression   . Chronic back pain     "neurostimulator electrode leads overlie the lower thoracic spinal canal" - per cxr report 07/10/14  . Spinal stenosis   . GERD (gastroesophageal reflux disease)   . PONV (postoperative nausea and vomiting)   . History of kidney stones   . Arthritis   . Left foot drop     SINCE 2009 - RELATED TO BACK PROBLEM - WEARS BRACE    HPI: [name], 73 y.o. female, has a history of pain and functional disability in the left hip(s) due to arthritis and patient has failed non-surgical conservative treatments for greater than 12 weeks to include NSAID's and/or analgesics, corticosteriod injections, use of assistive devices and activity modification. Onset of symptoms was gradual starting 1+ years ago with gradually worsening course since that time.The patient noted no past surgery on the left hip(s). Patient currently rates pain in the left hip at 7 out of 10 with activity. Patient has night pain, worsening of pain with activity and weight bearing, trendelenberg gait, pain that interfers with activities of daily living and pain with passive range of motion. Patient has evidence of periarticular osteophytes and joint space narrowing by imaging studies. This condition presents safety issues increasing the risk of falls. There is no current active infection. Risks, benefits and expectations were discussed with the patient. Risks  including but not limited to the risk of anesthesia, blood clots, nerve damage, blood vessel damage, failure of the prosthesis, infection and up to and including death. Patient understand the risks, benefits and expectations and wishes to proceed with surgery.   PCP: [name]   Discharged Condition: good  Hospital Course:  Patient underwent the above stated procedure on 07/17/2014. Patient tolerated the procedure well and brought to the recovery room in good condition and subsequently to the floor.  POD #1 BP: 100/70 ; Pulse: 60 ; Temp: 97.7 F (36.5 C) ; Resp: 16  Patient reports pain as mild. Sleeping this am, no events, relatively comfortable Dorsiflexion/plantar flexion intact, incision: dressing C/D/I, no cellulitis present and compartment soft.   LABS  Basename    HGB  10.4  HCT  31.5   POD #2  BP: 120/54 ; Pulse: 64 ;Temp: 98.3 F (36.8 C) ; Resp: 18  Patient reports pain as mild, pain controlled. No events throughout the night. Dorsiflexion/plantar flexion intact, incision: dressing C/D/I, no cellulitis present and compartment soft.   LABS  Basename    HGB  9.6  HCT  29.4   POD #3  BP: 133/62 ; Pulse: 55 ; Temp: 97.6 F (36.4 C) ; Resp: 18  Patient reports pain as mild. Some thigh soreness. Ready to go home versus SNF Dorsiflexion/plantar flexion intact, incision: dressing C/D/I, no cellulitis present and compartment soft.   LABS   No new labs   Discharge Exam: General appearance: alert, cooperative and no distress Extremities: Homans sign is negative,  no sign of DVT, no edema, redness or tenderness in the calves or thighs and no ulcers, gangrene or trophic changes  Disposition: Home with follow up in 2 weeks   Follow-up Information   Follow [facility] with [facility] (home health physical therapy)    Contact information:   [address] [place] [address] [phone]       Follow up with [facility] (3n1  (commode))    Contact information:   [address] [place] [id number] [phone]       Follow up with [name]. Schedule an appointment as soon as possible for a visit in 2 weeks.   Specialty:  Orthopedic Surgery   Contact information:   [address] [place] [address] [phone]       Discharge Instructions   Call [name] / Call 911    Complete by:  As directed   If you experience chest pain or shortness of breath, CALL 911 and be transported to the hospital emergency room.  If you develope a fever above 101 F, pus (white drainage) or increased drainage or redness at the wound, or calf pain, call your surgeon's office.     Change dressing    Complete by:  As directed   Maintain surgical dressing for 10-14 days, or until follow up in the clinic.     Constipation Prevention    Complete by:  As directed   Drink plenty of fluids.  Prune juice may be helpful.  You may use a stool softener, such as Colace (over the counter) 100 mg twice a day.  Use MiraLax (over the counter) for constipation as needed.     Diet - low sodium heart healthy    Complete by:  As directed      Discharge instructions    Complete by:  As directed   Maintain surgical dressing for 10-14 days, or until follow up in the clinic. [facility]. Call with any questions or concerns.     Increase activity slowly as tolerated    Complete by:  As directed      TED hose    Complete by:  As directed   Use stockings (TED hose) for 2 weeks on both leg(s).  You may remove them at night for sleeping.     Weight bearing as tolerated    Complete by:  As directed   Laterality:  left  Extremity:  Lower             Medication List    STOP taking these medications       aspirin 81 MG tablet  Replaced by:  aspirin 325 MG EC tablet     HYDROcodone-acetaminophen 10-325 MG per tablet  Commonly known as:  NORCO  Replaced by:   HYDROcodone-acetaminophen 7.5-325 MG per tablet      TAKE these medications       aspirin 325 MG EC tablet  Take 1 tablet (325 mg total) by mouth 2 (two) times daily.     CALCIUM 1200 PO  Take 1 tablet by mouth 2 (two) times daily.     doxazosin 8 MG tablet  Commonly known as:  CARDURA  Take 8 mg by mouth every morning.     DSS 100 MG Caps  Take 100 mg by mouth 2 (two) times daily.     ferrous sulfate 325 (65 FE) MG tablet  Take 1 tablet (325 mg total) by mouth 3 (three) times daily after meals.     gabapentin 300 MG capsule  Commonly known as:  NEURONTIN  Take 300 mg by mouth 3 (three) times daily.     HYDROcodone-acetaminophen 7.5-325 MG per tablet  Commonly known as:  NORCO  Take 1-2 tablets by mouth every 4 (four) hours as needed for moderate pain.     isosorbide mononitrate 60 MG 24 hr tablet  Commonly known as:  IMDUR  Take 60 mg by mouth every morning.     multivitamin tablet  Take 1 tablet by mouth every morning.     omeprazole 20 MG capsule  Commonly known as:  PRILOSEC  Take 20 mg by mouth every morning.     polyethylene glycol packet  Commonly known as:  MIRALAX / GLYCOLAX  Take 17 g by mouth 2 (two) times daily.     sertraline 100 MG tablet  Commonly known as:  ZOLOFT  Take 200 mg by mouth every morning.     simvastatin 80 MG tablet  Commonly known as:  ZOCOR  Take 80 mg by mouth every morning.     tiZANidine 4 MG tablet  Commonly known as:  ZANAFLEX  Take 1 tablet (4 mg total) by mouth every 6 (six) hours as needed.         Signed: Anastasio Auerbach. Joni Norrod   PA-C  07/21/2014, 10:35 AM

## 2015-06-17 ENCOUNTER — Other Ambulatory Visit: Payer: Self-pay

## 2015-06-18 ENCOUNTER — Encounter: Payer: Self-pay | Admitting: Internal Medicine

## 2015-06-18 LAB — CYTOLOGY - PAP

## 2015-08-07 ENCOUNTER — Ambulatory Visit (AMBULATORY_SURGERY_CENTER): Payer: Self-pay | Admitting: *Deleted

## 2015-08-07 VITALS — Ht 63.5 in | Wt 159.2 lb

## 2015-08-07 DIAGNOSIS — Z1211 Encounter for screening for malignant neoplasm of colon: Secondary | ICD-10-CM

## 2015-08-07 NOTE — Progress Notes (Signed)
No egg or soy allergy  No anesthesia or intubation problems per pt  No diet medications taken  Registered in EMMI   

## 2015-08-15 ENCOUNTER — Encounter: Payer: Self-pay | Admitting: Internal Medicine

## 2015-08-21 ENCOUNTER — Ambulatory Visit (AMBULATORY_SURGERY_CENTER): Payer: Medicare Other | Admitting: Internal Medicine

## 2015-08-21 ENCOUNTER — Encounter: Payer: Self-pay | Admitting: Internal Medicine

## 2015-08-21 VITALS — BP 135/70 | HR 59 | Temp 98.1°F | Resp 17 | Ht 63.5 in | Wt 159.0 lb

## 2015-08-21 DIAGNOSIS — Z1211 Encounter for screening for malignant neoplasm of colon: Secondary | ICD-10-CM

## 2015-08-21 MED ORDER — SODIUM CHLORIDE 0.9 % IV SOLN: 500.0000 mL | INTRAVENOUS | Status: DC

## 2015-08-21 NOTE — Patient Instructions (Addendum)
No polyps! No cancer! You do have diverticulosis - thickened muscle rings and pouches in the colon wall. Please read the handout about this condition.  I do not recommend routine repeat colon cancer screening for you anymore.  I appreciate the opportunity to care for you. Iva Boop, MD, FACG  YOU HAD AN ENDOSCOPIC PROCEDURE TODAY AT THE [facility]:   Refer to the procedure report that was given to you for any specific questions about what was found during the examination.  If the procedure report does not answer your questions, please call your gastroenterologist to clarify.  If you requested that your care partner not be given the details of your procedure findings, then the procedure report has been included in a sealed envelope for you to review at your convenience later.  YOU SHOULD EXPECT: Some feelings of bloating in the abdomen. Passage of more gas than usual.  Walking can help get rid of the air that was put into your GI tract during the procedure and reduce the bloating. If you had a lower endoscopy (such as a colonoscopy or flexible sigmoidoscopy) you may notice spotting of blood in your stool or on the toilet paper. If you underwent a bowel prep for your procedure, you may not have a normal bowel movement for a few days.  Please Note:  You might notice some irritation and congestion in your nose or some drainage.  This is from the oxygen used during your procedure.  There is no need for concern and it should clear up in a day or so.  SYMPTOMS TO REPORT IMMEDIATELY:   Following lower endoscopy (colonoscopy or flexible sigmoidoscopy):  Excessive amounts of blood in the stool  Significant tenderness or worsening of abdominal pains  Swelling of the abdomen that is new, acute  Fever of 100F or higher   Following upper endoscopy (EGD)  Vomiting of blood or coffee ground material  New chest pain or pain under the shoulder blades  Painful or persistently  difficult swallowing  New shortness of breath  Fever of 100F or higher  Black, tarry-looking stools  For urgent or emergent issues, a gastroenterologist can be reached at any hour by calling [phone].   DIET: Your first meal following the procedure should be a small meal and then it is ok to progress to your normal diet. Heavy or fried foods are harder to digest and may make you feel nauseous or bloated.  Likewise, meals heavy in dairy and vegetables can increase bloating.  Drink plenty of fluids but you should avoid alcoholic beverages for 24 hours.  ACTIVITY:  You should plan to take it easy for the rest of today and you should NOT DRIVE or use heavy machinery until tomorrow (because of the sedation medicines used during the test).    FOLLOW UP: Our staff will call the number listed on your records the next business day following your procedure to check on you and address any questions or concerns that you may have regarding the information given to you following your procedure. If we do not reach you, we will leave a message.  However, if you are feeling well and you are not experiencing any problems, there is no need to return our call.  We will assume that you have returned to your regular daily activities without incident.  If any biopsies were taken you will be contacted by phone or by letter within the next 1-3 weeks.  Please call us at [phone] if you have not heard about the biopsies in 3 weeks.    SIGNATURES/CONFIDENTIALITY: You and/or your care partner have signed paperwork which will be entered into your electronic medical record.  These signatures attest to the fact that that the information above on your After Visit Summary has been reviewed and is understood.  Full responsibility of the confidentiality of this discharge information lies with you and/or your care-partner.  Diverticulosis information given.

## 2015-08-21 NOTE — Op Note (Signed)
 [facility] [address] [place], [id number]   COLONOSCOPY PROCEDURE REPORT  PATIENT: [name], [name]  MR#: [id number] BIRTHDATE: 29-Jan-1941 , 74  yrs. old GENDER: female ENDOSCOPIST: Iva Booparl E Joshual Terrio, MD, [facility] PROCEDURE DATE:  08/21/2015 PROCEDURE:   Colonoscopy, screening First Screening Colonoscopy - Avg.  risk and is 50 yrs.  old or older - No.  Prior Negative Screening - Now for repeat screening. 10 or more years since last screening  History of Adenoma - Now for follow-up colonoscopy & has been > or = to 3 yrs.  N/A  Polyps removed today? No Recommend repeat exam, <10 yrs? No ASA CLASS:   Class III INDICATIONS:Screening for colonic neoplasia and Colorectal Neoplasm Risk Assessment for this procedure is average risk. MEDICATIONS: Propofol 200 mg IV and Monitored anesthesia care  DESCRIPTION OF PROCEDURE:   After the risks benefits and alternatives of the procedure were thoroughly explained, informed consent was obtained.  The digital rectal exam revealed no abnormalities of the rectum.   The LB [id number]  endoscope was introduced through the anus and advanced to the cecum, which was identified by both the appendix and ileocecal valve. No adverse events experienced.   The quality of the prep was good.  (MiraLax was used)  The instrument was then slowly withdrawn as the colon was fully examined. Estimated blood loss is zero unless otherwise noted in this procedure report.      COLON FINDINGS: There was diverticulosis noted in the sigmoid colon. The examination was otherwise normal.  Retroflexed views revealed no abnormalities. Some photos did not capture. The time to cecum = 4.8 Withdrawal time = 8.3   The scope was withdrawn and the procedure completed. COMPLICATIONS: There were no immediate complications.  ENDOSCOPIC IMPRESSION: 1.   Diverticulosis was noted in the sigmoid colon 2.   The examination was otherwise  normal  RECOMMENDATIONS: Routine repeat colonoscopy screening not necessary.  See me/GI as needed.  eSigned:  Iva Booparl E Zofia Peckinpaugh, MD, [facility] 08/21/2015 9:49 AM   cc: The Patient and Mady GemmaKristen Kaplan, PA-C

## 2015-08-21 NOTE — Progress Notes (Signed)
To recovery, report to Scott, RN, VSS 

## 2015-08-26 ENCOUNTER — Telehealth: Payer: Self-pay

## 2015-08-26 NOTE — Telephone Encounter (Signed)
  Follow up Call-  Call back number 08/21/2015  Post procedure Call Back phone  # [phone]  Permission to leave phone message Yes     Patient questions:  Do you have a fever, pain , or abdominal swelling? No. Pain Score  0 *  Have you tolerated food without any problems? Yes.    Have you been able to return to your normal activities? Yes.    Do you have any questions about your discharge instructions: Diet   No. Medications  No. Follow up visit  No.  Do you have questions or concerns about your Care? No.  Actions: * If pain score is 4 or above: No action needed, pain <4.

## 2015-10-09 ENCOUNTER — Other Ambulatory Visit: Payer: Self-pay | Admitting: Obstetrics and Gynecology

## 2015-10-09 DIAGNOSIS — N644 Mastodynia: Secondary | ICD-10-CM

## 2015-10-11 ENCOUNTER — Ambulatory Visit
Admission: RE | Admit: 2015-10-11 | Discharge: 2015-10-11 | Disposition: A | Discharge status: Home / Self Care | Payer: Medicare Other | Source: Ambulatory Visit | Attending: Obstetrics and Gynecology | Admitting: Obstetrics and Gynecology

## 2015-10-11 DIAGNOSIS — N644 Mastodynia: Secondary | ICD-10-CM

## 2017-01-19 ENCOUNTER — Ambulatory Visit: Payer: Medicare Other | Admitting: Physician Assistant

## 2017-10-21 ENCOUNTER — Emergency Department (HOSPITAL_BASED_OUTPATIENT_CLINIC_OR_DEPARTMENT_OTHER)
Admission: EM | Admit: 2017-10-21 | Discharge: 2017-10-21 | Disposition: A | Discharge status: Home / Self Care | Payer: Medicare Other | Attending: Emergency Medicine | Admitting: Emergency Medicine

## 2017-10-21 ENCOUNTER — Other Ambulatory Visit: Payer: Self-pay

## 2017-10-21 ENCOUNTER — Emergency Department (HOSPITAL_BASED_OUTPATIENT_CLINIC_OR_DEPARTMENT_OTHER): Payer: Medicare Other

## 2017-10-21 ENCOUNTER — Encounter (HOSPITAL_BASED_OUTPATIENT_CLINIC_OR_DEPARTMENT_OTHER): Payer: Self-pay | Admitting: *Deleted

## 2017-10-21 DIAGNOSIS — Z79899 Other long term (current) drug therapy: Secondary | ICD-10-CM | POA: Insufficient documentation

## 2017-10-21 DIAGNOSIS — I2510 Atherosclerotic heart disease of native coronary artery without angina pectoris: Secondary | ICD-10-CM | POA: Diagnosis not present

## 2017-10-21 DIAGNOSIS — N189 Chronic kidney disease, unspecified: Secondary | ICD-10-CM | POA: Diagnosis not present

## 2017-10-21 DIAGNOSIS — Z96642 Presence of left artificial hip joint: Secondary | ICD-10-CM | POA: Diagnosis not present

## 2017-10-21 DIAGNOSIS — M549 Dorsalgia, unspecified: Secondary | ICD-10-CM | POA: Insufficient documentation

## 2017-10-21 DIAGNOSIS — R21 Rash and other nonspecific skin eruption: Secondary | ICD-10-CM | POA: Insufficient documentation

## 2017-10-21 DIAGNOSIS — I129 Hypertensive chronic kidney disease with stage 1 through stage 4 chronic kidney disease, or unspecified chronic kidney disease: Secondary | ICD-10-CM | POA: Diagnosis not present

## 2017-10-21 DIAGNOSIS — Z7982 Long term (current) use of aspirin: Secondary | ICD-10-CM | POA: Diagnosis not present

## 2017-10-21 DIAGNOSIS — R109 Unspecified abdominal pain: Secondary | ICD-10-CM | POA: Diagnosis present

## 2017-10-21 DIAGNOSIS — B029 Zoster without complications: Secondary | ICD-10-CM | POA: Insufficient documentation

## 2017-10-21 MED ORDER — NAPROXEN 250 MG PO TABS
375.0000 mg | ORAL_TABLET | Freq: Once | ORAL | Status: AC
  Administered 2017-10-21: 375 mg via ORAL
  Filled 2017-10-21: qty 2

## 2017-10-21 MED ORDER — HYDROCODONE-ACETAMINOPHEN 5-325 MG PO TABS: 1.0000 | ORAL_TABLET | Freq: Four times a day (QID) | ORAL | 0 refills | Status: DC | PRN

## 2017-10-21 MED ORDER — VALACYCLOVIR HCL 1 G PO TABS: 1000.0000 mg | ORAL_TABLET | Freq: Three times a day (TID) | ORAL | 0 refills | Status: AC

## 2017-10-21 MED ORDER — PREDNISONE 10 MG (21) PO TBPK: ORAL_TABLET | Freq: Every day | ORAL | 0 refills | Status: DC

## 2017-10-21 MED ORDER — TRAMADOL HCL 50 MG PO TABS
50.0000 mg | ORAL_TABLET | Freq: Once | ORAL | Status: AC
  Administered 2017-10-21: 50 mg via ORAL
  Filled 2017-10-21: qty 1

## 2017-10-21 MED ORDER — LIDOCAINE 5 % EX PTCH: 1.0000 | MEDICATED_PATCH | CUTANEOUS | 0 refills | Status: DC

## 2017-10-21 NOTE — Discharge Instructions (Signed)
As we discussed, your symptoms are likely from possible Shingles. Take the medications as prescribed. The pain patches should help very much, but I have also prescribed Norco for more severe pain.  If you develop worsening pain, cough, shortness of breath, fevers, or any other concerning symptoms, return to the ER immediately.  Do not come into contact with children <441  year old or pregnant women until your skin rash is resolved.

## 2017-10-21 NOTE — ED Triage Notes (Signed)
Soreness and pain under her right breast and into her scapula for a week. No hx of shingles. She has one single blister like lesion noted.

## 2017-10-21 NOTE — ED Provider Notes (Signed)
[facility] EMERGENCY DEPARTMENT Provider Note   CSN: [id number] Arrival date & time: 10/21/17  1318     History   Chief Complaint Chief Complaint  Patient presents with  . Back Pain    HPI [name] is a 77 y.o. female.  HPI   77 yo F with PMHx CAD, HTN, HLD, spinal stenosis s/p stimulator here with R flank pain. Pt reports that last week, she had cough, congestion and URI sx. These seemed to have improved. Over the last 2 days, however, she's noticed a burning, sharp, stabbing pain along her upper back, radiating toward her right flank and breast. Pain is extreme witih palpation, but denies any preceding injuries. No midline pain. No back pain. No weakness or numbness. No associated cough, SOB, diaphoresis. Pain does not cross midline. Of note, today, she noticed a red, painful rash on her right flank. No alleviating factors.  Past Medical History:  Diagnosis Date  . Arthritis   . Chest pain   . Chronic back pain    "neurostimulator electrode leads overlie the lower thoracic spinal canal" - per cxr report 07/10/14  . Chronic kidney disease    kidney stone  . Coronary artery disease    NONOBSTRUCTIVE  . Depression   . GERD (gastroesophageal reflux disease)   . History of kidney stones   . Hyperlipidemia   . Hypertension   . Left foot drop    SINCE 2009 - RELATED TO BACK PROBLEM - WEARS BRACE  . PONV (postoperative nausea and vomiting)   . Spinal stenosis   . Thyroid disease    hypothryoid    Patient Active Problem List   Diagnosis Date Noted  . 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 Hypertension   . Hyperlipidemia   . Coronary artery disease   . Spinal stenosis     Past Surgical History:  Procedure Laterality Date  . BACK SURGERY    . CARDIAC CATHETERIZATION  02/22/2009   EF 60%  . CATARACT EXTRACTION     both eyes  . COLONOSCOPY    . CYSTOSCOPY     FOR KIDNEY STONE  . LUMBAR LAMINECTOMY/DECOMPRESSION MICRODISCECTOMY    . TOTAL HIP  ARTHROPLASTY Left 07/17/2014   Procedure: LEFT TOTAL HIP ARTHROPLASTY ANTERIOR APPROACH;  Surgeon: Shelda PalMatthew D Olin, MD;  Location: [facility];  Service: Orthopedics;  Laterality: Left;    OB History    No data available       Home Medications    Prior to Admission medications   Medication Sig Start Date End Date Taking? Authorizing Provider  aspirin EC 325 MG EC tablet Take 1 tablet (325 mg total) by mouth 2 (two) times daily. 07/19/14 10/21/17 Yes Babish, Molli HazardMatthew, PA-C  Calcium Carbonate-Vit D-Min (CALCIUM 1200 PO) Take 1 tablet by mouth 2 (two) times daily.    Yes [provider]  doxazosin (CARDURA) 8 MG tablet Take 8 mg by mouth every morning.    Yes [provider]  isosorbide mononitrate (IMDUR) 60 MG 24 hr tablet Take 60 mg by mouth every morning.    Yes [provider]  levothyroxine (SYNTHROID, LEVOTHROID) 25 MCG tablet Take by mouth daily. 05/22/15  Yes [provider]  Multiple Vitamin (MULTIVITAMIN) tablet Take 1 tablet by mouth every morning.    Yes [provider]  sertraline (ZOLOFT) 100 MG tablet Take 200 mg by mouth every morning.    Yes [provider]  simvastatin (ZOCOR) 80 MG tablet Take 80  mg by mouth every morning.    Yes [provider]  aspirin 81 MG tablet Take 81 mg by mouth daily.    [provider]  ferrous sulfate 325 (65 FE) MG tablet Take 1 tablet (325 mg total) by mouth 3 (three) times daily after meals. 07/19/14   Lanney Gins, PA-C  HYDROcodone-acetaminophen (NORCO/VICODIN) 5-325 MG tablet Take 1 tablet by mouth every 6 (six) hours as needed for severe pain. 10/21/17   Shaune Pollack, MD  lidocaine (LIDODERM) 5 % Place 1 patch onto the skin daily. Remove & Discard patch within 12 hours or as directed by MD 10/21/17   Shaune Pollack, MD  predniSONE (STERAPRED UNI-PAK 21 TAB) 10 MG (21) TBPK tablet Take by mouth daily. Take 6 tabs by mouth daily  for 2 days, then 5 tabs for 2 days, then 4  tabs for 2 days, then 3 tabs for 2 days, 2 tabs for 2 days, then 1 tab by mouth daily for 2 days 10/21/17   Shaune Pollack, MD  valACYclovir (VALTREX) 1000 MG tablet Take 1 tablet (1,000 mg total) by mouth 3 (three) times daily for 7 days. 10/21/17 10/28/17  Shaune Pollack, MD    Family History Family History  Problem Relation Age of Onset  . Colon cancer Neg Hx   . Esophageal cancer Neg Hx   . Rectal cancer Neg Hx   . Stomach cancer Neg Hx     Social History Social History   Tobacco Use  . Smoking status: Never Smoker  . Smokeless tobacco: Never Used  Substance Use Topics  . Alcohol use: No    Alcohol/week: 0.0 oz  . Drug use: No     Allergies   Patient has no known allergies.   Review of Systems Review of Systems  Constitutional: Negative for chills and fever.  HENT: Negative for congestion, rhinorrhea and sore throat.   Eyes: Negative for visual disturbance.  Respiratory: Negative for cough, shortness of breath and wheezing.   Cardiovascular: Negative for chest pain and leg swelling.  Gastrointestinal: Negative for abdominal pain, diarrhea, nausea and vomiting.  Genitourinary: Negative for dysuria, flank pain, vaginal bleeding and vaginal discharge.  Musculoskeletal: Positive for arthralgias and back pain. Negative for neck pain.  Skin: Negative for rash.  Allergic/Immunologic: Negative for immunocompromised state.  Neurological: Negative for syncope and headaches.  Hematological: Does not bruise/bleed easily.  All other systems reviewed and are negative.    Physical Exam Updated Vital Signs BP 135/72 (BP Location: Right Arm)   Pulse 60   Temp 98.6 F (37 C) (Oral)   Resp 17   Ht 5' 3.5" (1.613 m)   Wt 61.2 kg (135 lb)   SpO2 99%   BMI 23.54 kg/m   Physical Exam  Constitutional: She is oriented to person, place, and time. She appears well-developed and well-nourished. No distress.  HENT:  Head: Normocephalic and atraumatic.  Eyes: Conjunctivae are  normal.  Neck: Neck supple.  Cardiovascular: Normal rate, regular rhythm and normal heart sounds. Exam reveals no friction rub.  No murmur heard. Pulmonary/Chest: Effort normal and breath sounds normal. No respiratory distress. She has no wheezes. She has no rales.  Abdominal: She exhibits no distension.  Musculoskeletal: She exhibits no edema.  Neurological: She is alert and oriented to person, place, and time. She exhibits normal muscle tone.  Skin: Skin is warm. Capillary refill takes less than 2 seconds.     Psychiatric: She has a normal mood and affect.  Nursing  note and vitals reviewed.    ED Treatments / Results  Labs (all labs ordered are listed, but only abnormal results are displayed) Labs Reviewed - No data to display  EKG  EKG Interpretation  Date/Time:  Thursday October 21 2017 14:40:13 EST Ventricular Rate:  60 PR Interval:    QRS Duration: 88 QT Interval:  439 QTC Calculation: 439 R Axis:   -29 Text Interpretation:  Sinus rhythm Borderline left axis deviation Low voltage, precordial leads No significant change since last tracing Confirmed by Shaune Pollack [phone]) on 10/21/2017 2:47:13 PM       Radiology Dg Chest 2 View  Result Date: 10/21/2017 CLINICAL DATA:  Chest pain EXAM: CHEST  2 VIEW COMPARISON:  July 10, 2014 FINDINGS: There is no edema or consolidation. Heart is borderline enlarged with pulmonary vascularity within normal limits. There is a moderate hiatal hernia. Stimulator lead tips are in the midthoracic region. There is aortic atherosclerosis. No adenopathy. No evident bone lesions. IMPRESSION: Borderline cardiac enlargement. No edema or consolidation. Aortic atherosclerosis. Moderate hiatal hernia. Thoracic stimulator tips in midthoracic region. Aortic Atherosclerosis (ICD10-I70.0). Electronically Signed   By: Bretta Bang III M.D.   On: 10/21/2017 14:37    Procedures Procedures (including critical care time)  Medications Ordered in  ED Medications  traMADol (ULTRAM) tablet 50 mg (50 mg Oral Given 10/21/17 1445)  naproxen (NAPROSYN) tablet 375 mg (375 mg Oral Given 10/21/17 1445)     Initial Impression / Assessment and Plan / ED Course  I have reviewed the triage vital signs and the nursing notes.  Pertinent labs & imaging results that were available during my care of the patient were reviewed by me and considered in my medical decision making (see chart for details).     77 yo F here with thoracic and R flank pain. Exam as above. History of preceding URI with now sharp, neuropathic pain along T4/5 distribution with small, red, vesicular lesion formed in distribution is c/w shingles. DDx includes paraspinal strain, intercostal sprain/costochondritis. She is o/w very well appearing. Not immune suppressed. CXR without abnormality - doubt referred pain from cardiac or pulm source. EKG non-ischemic. She feels better in ED with sx management. Will treat as shingles, d/c with good return precautions and outpt follow-up. Pt is in agreement.  Final Clinical Impressions(s) / ED Diagnoses   Final diagnoses:  Herpes zoster without complication    ED Discharge Orders        Ordered    valACYclovir (VALTREX) 1000 MG tablet  3 times daily     10/21/17 1516    predniSONE (STERAPRED UNI-PAK 21 TAB) 10 MG (21) TBPK tablet  Daily     10/21/17 1516    HYDROcodone-acetaminophen (NORCO/VICODIN) 5-325 MG tablet  Every 6 hours PRN     10/21/17 1516    lidocaine (LIDODERM) 5 %  Every 24 hours     10/21/17 1517       Shaune Pollack, MD 10/21/17 [phone]

## 2018-05-14 ENCOUNTER — Emergency Department (HOSPITAL_BASED_OUTPATIENT_CLINIC_OR_DEPARTMENT_OTHER): Payer: Medicare Other

## 2018-05-14 ENCOUNTER — Encounter (HOSPITAL_BASED_OUTPATIENT_CLINIC_OR_DEPARTMENT_OTHER): Payer: Self-pay | Admitting: Emergency Medicine

## 2018-05-14 ENCOUNTER — Other Ambulatory Visit: Payer: Self-pay

## 2018-05-14 ENCOUNTER — Emergency Department (HOSPITAL_BASED_OUTPATIENT_CLINIC_OR_DEPARTMENT_OTHER)
Admission: EM | Admit: 2018-05-14 | Discharge: 2018-05-14 | Disposition: A | Discharge status: Home / Self Care | Payer: Medicare Other | Attending: Emergency Medicine | Admitting: Emergency Medicine

## 2018-05-14 DIAGNOSIS — G44219 Episodic tension-type headache, not intractable: Secondary | ICD-10-CM | POA: Diagnosis not present

## 2018-05-14 DIAGNOSIS — I129 Hypertensive chronic kidney disease with stage 1 through stage 4 chronic kidney disease, or unspecified chronic kidney disease: Secondary | ICD-10-CM | POA: Insufficient documentation

## 2018-05-14 DIAGNOSIS — I2510 Atherosclerotic heart disease of native coronary artery without angina pectoris: Secondary | ICD-10-CM | POA: Diagnosis not present

## 2018-05-14 DIAGNOSIS — R519 Headache, unspecified: Secondary | ICD-10-CM

## 2018-05-14 DIAGNOSIS — Z7982 Long term (current) use of aspirin: Secondary | ICD-10-CM | POA: Diagnosis not present

## 2018-05-14 DIAGNOSIS — Z79899 Other long term (current) drug therapy: Secondary | ICD-10-CM | POA: Insufficient documentation

## 2018-05-14 DIAGNOSIS — R42 Dizziness and giddiness: Secondary | ICD-10-CM

## 2018-05-14 DIAGNOSIS — N189 Chronic kidney disease, unspecified: Secondary | ICD-10-CM | POA: Insufficient documentation

## 2018-05-14 DIAGNOSIS — I671 Cerebral aneurysm, nonruptured: Secondary | ICD-10-CM | POA: Insufficient documentation

## 2018-05-14 DIAGNOSIS — R51 Headache: Secondary | ICD-10-CM

## 2018-05-14 LAB — CBC WITH DIFFERENTIAL/PLATELET
BASOS ABS: 0 10*3/uL (ref 0.0–0.1)
Basophils Relative: 0 %
EOS PCT: 1 %
Eosinophils Absolute: 0.1 10*3/uL (ref 0.0–0.7)
HCT: 34.6 % — ABNORMAL LOW (ref 36.0–46.0)
HEMOGLOBIN: 11.7 g/dL — AB (ref 12.0–15.0)
LYMPHS ABS: 1.5 10*3/uL (ref 0.7–4.0)
Lymphocytes Relative: 24 %
MCH: 31.8 pg (ref 26.0–34.0)
MCHC: 33.8 g/dL (ref 30.0–36.0)
MCV: 94 fL (ref 78.0–100.0)
Monocytes Absolute: 0.4 10*3/uL (ref 0.1–1.0)
Monocytes Relative: 6 %
NEUTROS PCT: 69 %
Neutro Abs: 4.2 10*3/uL (ref 1.7–7.7)
PLATELETS: 154 10*3/uL (ref 150–400)
RBC: 3.68 MIL/uL — AB (ref 3.87–5.11)
RDW: 12.4 % (ref 11.5–15.5)
WBC: 6.1 10*3/uL (ref 4.0–10.5)

## 2018-05-14 LAB — COMPREHENSIVE METABOLIC PANEL
ALK PHOS: 98 U/L (ref 38–126)
ALT: 15 U/L (ref 0–44)
AST: 25 U/L (ref 15–41)
Albumin: 4 g/dL (ref 3.5–5.0)
Anion gap: 8 (ref 5–15)
BUN: 19 mg/dL (ref 8–23)
CALCIUM: 9.1 mg/dL (ref 8.9–10.3)
CO2: 26 mmol/L (ref 22–32)
CREATININE: 0.69 mg/dL (ref 0.44–1.00)
Chloride: 106 mmol/L (ref 98–111)
GFR calc Af Amer: >60 mL/min (ref >60)
GFR calc non Af Amer: >60 mL/min (ref >60)
GLUCOSE: 117 mg/dL — AB (ref 70–99)
Potassium: 4.1 mmol/L (ref 3.5–5.1)
SODIUM: 140 mmol/L (ref 135–145)
Total Bilirubin: 0.4 mg/dL (ref 0.3–1.2)
Total Protein: 6.7 g/dL (ref 6.5–8.1)

## 2018-05-14 LAB — TROPONIN I: Troponin I: <0.03 ng/mL (ref <0.03)

## 2018-05-14 MED ORDER — METOCLOPRAMIDE HCL 5 MG/ML IJ SOLN
10.0000 mg | Freq: Once | INTRAMUSCULAR | Status: AC
  Administered 2018-05-14: 10 mg via INTRAVENOUS
  Filled 2018-05-14: qty 2

## 2018-05-14 MED ORDER — MECLIZINE HCL 12.5 MG PO TABS: 12.5000 mg | ORAL_TABLET | Freq: Three times a day (TID) | ORAL | 0 refills | Status: DC | PRN

## 2018-05-14 MED ORDER — IOPAMIDOL (ISOVUE-370) INJECTION 76%
100.0000 mL | Freq: Once | INTRAVENOUS | Status: AC | PRN
  Administered 2018-05-14: 100 mL via INTRAVENOUS

## 2018-05-14 MED ORDER — MECLIZINE HCL 25 MG PO TABS
25.0000 mg | ORAL_TABLET | Freq: Once | ORAL | Status: AC
  Administered 2018-05-14: 25 mg via ORAL
  Filled 2018-05-14: qty 1

## 2018-05-14 MED ORDER — PROMETHAZINE HCL 25 MG/ML IJ SOLN
12.5000 mg | Freq: Once | INTRAMUSCULAR | Status: AC
  Administered 2018-05-14: 12.5 mg via INTRAVENOUS
  Filled 2018-05-14: qty 1

## 2018-05-14 MED ORDER — DEXAMETHASONE SODIUM PHOSPHATE 10 MG/ML IJ SOLN
4.0000 mg | Freq: Once | INTRAMUSCULAR | Status: AC
  Administered 2018-05-14: 4 mg via INTRAVENOUS
  Filled 2018-05-14: qty 1

## 2018-05-14 MED ORDER — SODIUM CHLORIDE 0.9 % IV BOLUS
1000.0000 mL | Freq: Once | INTRAVENOUS | Status: AC
  Administered 2018-05-14: 1000 mL via INTRAVENOUS

## 2018-05-14 MED ORDER — DIPHENHYDRAMINE HCL 50 MG/ML IJ SOLN
25.0000 mg | Freq: Once | INTRAMUSCULAR | Status: AC
  Administered 2018-05-14: 25 mg via INTRAVENOUS
  Filled 2018-05-14: qty 1

## 2018-05-14 MED ORDER — METOCLOPRAMIDE HCL 10 MG PO TABS: 10.0000 mg | ORAL_TABLET | Freq: Four times a day (QID) | ORAL | 0 refills | Status: DC | PRN

## 2018-05-14 NOTE — ED Provider Notes (Signed)
[facility] EMERGENCY DEPARTMENT Provider Note   CSN: [id number] Arrival date & time: 05/14/18  1456     History   Chief Complaint Chief Complaint  Patient presents with  . Dizziness    HPI Fran Lowesancy P Spero GeraldsFriddle is a 77 y.o. female hx of CKD, CAD, GERD, HL, HTN, here with dizziness.  Patient states that she has been feeling dizzy for the last 2 weeks.  Patient states that she usually gets dizziness when she wakes up.  She feels that the room was spinning and after several hours she feels better.  She has occasional headaches associated with it.  [facility] [employer] and she had sudden onset of dizziness and headache.  She states that she had a hard time walking and feels unsteady so she came in the ER for evaluation.  Denies any trouble speaking or focal weakness.  Denies history of previous strokes in the past.   The history is provided by the patient.    Past Medical History:  Diagnosis Date  . Arthritis   . Chest pain   . Chronic back pain    "neurostimulator electrode leads overlie the lower thoracic spinal canal" - per cxr report 07/10/14  . Chronic kidney disease    kidney stone  . Coronary artery disease    NONOBSTRUCTIVE  . Depression   . GERD (gastroesophageal reflux disease)   . History of kidney stones   . Hyperlipidemia   . Hypertension   . Left foot drop    SINCE 2009 - RELATED TO BACK PROBLEM - WEARS BRACE  . PONV (postoperative nausea and vomiting)   . Spinal stenosis   . Thyroid disease    hypothryoid    Patient Active Problem List   Diagnosis Date Noted  . 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 Hypertension   . Hyperlipidemia   . Coronary artery disease   . Spinal stenosis     Past Surgical History:  Procedure Laterality Date  . BACK SURGERY    . CARDIAC CATHETERIZATION  02/22/2009   EF 60%  . CATARACT EXTRACTION     both eyes  . COLONOSCOPY    . CYSTOSCOPY     FOR KIDNEY STONE  . LUMBAR LAMINECTOMY/DECOMPRESSION MICRODISCECTOMY     . TOTAL HIP ARTHROPLASTY Left 07/17/2014   Procedure: LEFT TOTAL HIP ARTHROPLASTY ANTERIOR APPROACH;  Surgeon: Shelda PalMatthew D Olin, MD;  Location: [facility];  Service: Orthopedics;  Laterality: Left;     OB History   None      Home Medications    Prior to Admission medications   Medication Sig Start Date End Date Taking? Authorizing Provider  aspirin 81 MG tablet Take 81 mg by mouth daily.    [provider]  aspirin EC 325 MG EC tablet Take 1 tablet (325 mg total) by mouth 2 (two) times daily. 07/19/14 10/21/17  Lanney GinsBabish, Matthew, PA-C  Calcium Carbonate-Vit D-Min (CALCIUM 1200 PO) Take 1 tablet by mouth 2 (two) times daily.     [provider]  doxazosin (CARDURA) 8 MG tablet Take 8 mg by mouth every morning.     [provider]  ferrous sulfate 325 (65 FE) MG tablet Take 1 tablet (325 mg total) by mouth 3 (three) times daily after meals. 07/19/14   Lanney GinsBabish, Matthew, PA-C  HYDROcodone-acetaminophen (NORCO/VICODIN) 5-325 MG tablet Take 1 tablet by mouth every 6 (six) hours as needed for severe pain. 10/21/17   Shaune PollackIsaacs, Cameron, MD  isosorbide mononitrate (IMDUR)  60 MG 24 hr tablet Take 60 mg by mouth every morning.     [provider]  levothyroxine (SYNTHROID, LEVOTHROID) 25 MCG tablet Take by mouth daily. 05/22/15   [provider]  lidocaine (LIDODERM) 5 % Place 1 patch onto the skin daily. Remove & Discard patch within 12 hours or as directed by MD 10/21/17   Shaune PollackIsaacs, Cameron, MD  Multiple Vitamin (MULTIVITAMIN) tablet Take 1 tablet by mouth every morning.     [provider]  predniSONE (STERAPRED UNI-PAK 21 TAB) 10 MG (21) TBPK tablet Take by mouth daily. Take 6 tabs by mouth daily  for 2 days, then 5 tabs for 2 days, then 4 tabs for 2 days, then 3 tabs for 2 days, 2 tabs for 2 days, then 1 tab by mouth daily for 2 days 10/21/17   Shaune PollackIsaacs, Cameron, MD  sertraline (ZOLOFT) 100 MG tablet Take 200 mg by mouth every morning.     [provider]  simvastatin (ZOCOR) 80 MG tablet Take 80 mg by mouth every morning.     [provider]    Family History Family History  Problem Relation Age of Onset  . Colon cancer Neg Hx   . Esophageal cancer Neg Hx   . Rectal cancer Neg Hx   . Stomach cancer Neg Hx     Social History Social History   Tobacco Use  . Smoking status: Never Smoker  . Smokeless tobacco: Never Used  Substance Use Topics  . Alcohol use: No    Alcohol/week: 0.0 standard drinks  . Drug use: No     Allergies   Patient has no known allergies.   Review of Systems Review of Systems  Neurological: Positive for dizziness.  All other systems reviewed and are negative.    Physical Exam Updated Vital Signs BP 124/72 (BP Location: Right Arm)   Pulse 69   Temp (!) 97.5 F (36.4 C) (Oral)   Resp 18   Ht 5' 3.5" (1.613 m)   Wt 59.9 kg   SpO2 100%   BMI 23.02 kg/m   Physical Exam  Constitutional: She is oriented to person, place, and time. She appears well-developed and well-nourished.  HENT:  Head: Normocephalic.  Mouth/Throat: Oropharynx is clear and moist.  Eyes: Pupils are equal, round, and reactive to light. EOM are normal.  No obvious nystagmus   Neck: Normal range of motion. Neck supple.  Cardiovascular: Normal rate, regular rhythm and normal heart sounds.  Pulmonary/Chest: Effort normal and breath sounds normal. No stridor. No respiratory distress.  Abdominal: Soft. Bowel sounds are normal. She exhibits no distension.  Musculoskeletal: Normal range of motion.  Neurological: She is alert and oriented to person, place, and time.  CN 2- 12 intact. ? Mild R dysmetria. Nl sensation and strength throughout. Slightly unsteady gait but able to ambulate by herself   Skin: Skin is warm.  Psychiatric: She has a normal mood and affect.  Nursing note and vitals reviewed.    ED Treatments / Results  Labs (all labs ordered are listed, but only abnormal results are displayed) Labs Reviewed   CBC WITH DIFFERENTIAL/PLATELET - Abnormal; Notable for the following components:      Result Value   RBC 3.68 (*)    Hemoglobin 11.7 (*)    HCT 34.6 (*)    All other components within normal limits  COMPREHENSIVE METABOLIC PANEL  TROPONIN I    EKG EKG Interpretation  Date/Time:  Saturday May 14 2018 16:01:24  EDT Ventricular Rate:  54 PR Interval:    QRS Duration: 92 QT Interval:  457 QTC Calculation: 434 R Axis:   8 Text Interpretation:  Sinus rhythm Borderline T wave abnormalities No significant change since last tracing Confirmed by Richardean Canal [phone]) on 05/14/2018 4:16:16 PM Also confirmed by Richardean Canal [phone]), editor Barbette Hair [phone])  on 05/14/2018 4:36:18 PM   Radiology No results found.  Procedures Procedures (including critical care time)  Medications Ordered in ED Medications  sodium chloride 0.9 % bolus 1,000 mL (1,000 mLs Intravenous New Bag/Given 05/14/18 1620)  metoCLOPramide (REGLAN) injection 10 mg (10 mg Intravenous Given 05/14/18 1620)  diphenhydrAMINE (BENADRYL) injection 25 mg (25 mg Intravenous Given 05/14/18 1620)  meclizine (ANTIVERT) tablet 25 mg (25 mg Oral Given 05/14/18 1621)     Initial Impression / Assessment and Plan / ED Course  I have reviewed the triage vital signs and the nursing notes.  Pertinent labs & imaging results that were available during my care of the patient were reviewed by me and considered in my medical decision making (see chart for details).     Davion Meara Rembold is a 77 y.o. female here with dizziness, headache. Has been going on for the last week, worse today. No previous hx of stroke. 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 Low suspicion for posterior circulation stroke. Will get labs, CTA head/neck to increase sensitivity of the study but if neg, I don't think she needs MRI. Symptoms going on for a week and I have low suspicion for posterior circulation stroke.   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 I called neurosurgeon, Dr. Maurice Small, who agreed with outpatient follow up. Headache improved. Ambulated well by herself. 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 Will give meclizine prn dizziness. Will refer to neuro outpatient.    Final Clinical Impressions(s) / ED Diagnoses   Final diagnoses:  None    ED Discharge Orders    None       Charlynne Pander, MD 05/14/18 [phone]

## 2018-05-14 NOTE — ED Notes (Signed)
CT will need to wait for bun/creat/egfr to result prior to CTA imaging, pt will also need a 20G or higher AC IV access for exam

## 2018-05-14 NOTE — ED Triage Notes (Signed)
Patient states that she has had dizziness all week - usually she wakes up with it and  after a few hours it clears and then she is fine. Today she continues to have a headache and dizzy feelings

## 2018-05-14 NOTE — Discharge Instructions (Signed)
Take tylenol for headaches.   Take reglan for severe headaches.   Take meclizine for dizziness.   See neurologist for dizziness   You have a small aneurysm in your brain. See neurosurgery for follow up   Return to ER if you have worse headaches, dizziness, passing out, falls, unsteadiness

## 2018-05-18 ENCOUNTER — Ambulatory Visit: Payer: Medicare Other | Admitting: Neurology

## 2018-05-18 ENCOUNTER — Telehealth: Payer: Self-pay | Admitting: *Deleted

## 2018-05-18 ENCOUNTER — Encounter: Payer: Self-pay | Admitting: Neurology

## 2018-05-18 ENCOUNTER — Telehealth: Payer: Self-pay | Admitting: Neurology

## 2018-05-18 VITALS — BP 116/73 | HR 55 | Ht 63.5 in | Wt 134.5 lb

## 2018-05-18 DIAGNOSIS — R269 Unspecified abnormalities of gait and mobility: Secondary | ICD-10-CM

## 2018-05-18 DIAGNOSIS — G459 Transient cerebral ischemic attack, unspecified: Secondary | ICD-10-CM

## 2018-05-18 DIAGNOSIS — M21372 Foot drop, left foot: Secondary | ICD-10-CM | POA: Diagnosis not present

## 2018-05-18 NOTE — Progress Notes (Signed)
PATIENT: [name] DOB: 06/27/1941  Chief Complaint  Patient presents with  . Dizziness    Orthostatic Vitals:  Lying: 116/73, 55, Sitting: 99/63, 62, Standing: 93/57, 67, Standing x 3 minutes: 95/62, 71. She is here with her daughter, [name] and granddaughter, [name].  She has been having intermittent dizzy spells for several weeks.  She was recently evaluated in ED on 05/14/18.  Marland Kitchen. Headache    She has been having daily headaches that are worse after her dizzy spells.    Marland Kitchen. PCP    Barbie BannerWilson, Fred H, MD - referred from ED.     HISTORICAL  [name] GeraldsFriddle is a 77 year old female, seen in request by her primary care physician Dr. Andrey CampanileWilson, Merlyn AlbertFred for evaluation of sudden onset dizziness, gait abnormality, she is accompanied by her daughter [name], granddaughter [name] neck today's clinical visit.  Initial evaluation was on May 18, 2018.  She had a past medical history of hyperlipidemia, hypertension, depression, her husband passed away in December 2017, she now lives with her granddaughter, she had a history of left lumbar radiculopathy, status decompression surgery in the past, with residual left foot drop, gait abnormality,  On May 11, 2018, she woke up early morning around 6 AM using bathroom, noticed unsteady gait, vertigo, room was spinning, nauseous, she was able to manage to use the bathroom, lying back to sleep again, few hours later, when she tried to get up again, symptoms still present, but had mild improvement, she has been symptomatic since his onset, few days later, when she went out shopping with her sister on May 14, 2018, she suddenly felt nauseous, dizziness, has to sit out waiting for her sister, ambulance was called, she was evaluated at emergency room,  I personally reviewed CTA head and neck on May 14, 2018, 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 aspirin 81 MG tablet Take 81 mg by mouth daily.    . Calcium Carbonate-Vit D-Min (CALCIUM 1200 PO) Take 1 tablet by mouth 2 (two) times daily.     Marland Kitchen. doxazosin (CARDURA) 8 MG tablet Take 8 mg by mouth every morning.     . ferrous sulfate 325 (65 FE) MG tablet Take 1 tablet (325 mg total) by mouth 3 (three) times daily after meals.  3  . isosorbide mononitrate (IMDUR) 60 MG 24 hr tablet Take 60 mg by mouth every morning.     Marland Kitchen. levothyroxine (SYNTHROID, LEVOTHROID) 25 MCG tablet Take by mouth daily.    Marland Kitchen. lidocaine (LIDODERM) 5 % Place 1 patch onto the skin daily. Remove & Discard patch within 12 hours or as directed by MD 30 patch 0  . meclizine (ANTIVERT) 12.5 MG tablet Take 1 tablet (12.5 mg total) by mouth 3 (three) times daily as needed for dizziness. 30 tablet 0  . metoCLOPramide (REGLAN) 10 MG tablet Take 1 tablet (10 mg total) by mouth every 6 (six) hours as needed for nausea (nausea/headache). 10 tablet 0  . Multiple Vitamin (MULTIVITAMIN) tablet Take 1 tablet by  mouth every morning.     . sertraline (ZOLOFT) 100 MG tablet Take 200 mg by mouth every morning.     . simvastatin (ZOCOR) 80 MG tablet Take 80 mg by mouth every morning.      No current facility-administered medications for this visit.     PAST MEDICAL HISTORY: Past Medical History:  Diagnosis Date  . Arthritis   . Chest pain   . Chronic back pain    "neurostimulator electrode leads overlie the lower thoracic spinal canal" - per cxr report 07/10/14  . Chronic kidney disease    kidney stone  . Coronary artery disease     NONOBSTRUCTIVE  . Depression   . Dizziness   . GERD (gastroesophageal reflux disease)   . Headache   . History of kidney stones   . Hyperlipidemia   . Hypertension   . Left foot drop    SINCE 2009 - RELATED TO BACK PROBLEM - WEARS BRACE  . PONV (postoperative nausea and vomiting)   . Spinal stenosis   . Thyroid disease    hypothryoid    PAST SURGICAL HISTORY: Past Surgical History:  Procedure Laterality Date  . BACK SURGERY    . CARDIAC CATHETERIZATION  02/22/2009   EF 60%  . CATARACT EXTRACTION     both eyes  . COLONOSCOPY    . CYSTOSCOPY     FOR KIDNEY STONE  . LUMBAR LAMINECTOMY/DECOMPRESSION MICRODISCECTOMY    . TOTAL HIP ARTHROPLASTY Left 07/17/2014   Procedure: LEFT TOTAL HIP ARTHROPLASTY ANTERIOR APPROACH;  Surgeon: Shelda PalMatthew D Olin, MD;  Location: [facility];  Service: Orthopedics;  Laterality: Left;    FAMILY HISTORY: Family History  Problem Relation Age of Onset  . Cancer Mother        started in gallbladder then spread to liver  . Heart attack Father   . Colon cancer Neg Hx   . Esophageal cancer Neg Hx   . Rectal cancer Neg Hx   . Stomach cancer Neg Hx     SOCIAL HISTORY: Social History   Socioeconomic History  . Marital status: Widowed    Spouse name: Not on file  . Number of children: 2  . Years of education: 4412  . Highest education level: High school graduate  Occupational History  . Occupation: Retired  Engineer, productionocial Needs  . Financial resource strain: Not on file  . Food insecurity:    Worry: Not on file    Inability: Not on file  . Transportation needs:    Medical: Not on file    Non-medical: Not on file  Tobacco Use  . Smoking status: Never Smoker  . Smokeless tobacco: Never Used  Substance and Sexual Activity  . Alcohol use: No    Alcohol/week: 0.0 standard drinks  . Drug use: No  . Sexual activity: Not on file  Lifestyle  . Physical activity:    Days per week: Not on file    Minutes per session: Not on file  . Stress: Not on file    Relationships  . Social connections:    Talks on phone: Not on file    Gets together: Not on file    Attends religious service: Not on file    Active member of club or organization: Not on file    Attends meetings of clubs or organizations: Not on file    Relationship status: Not on file  . Intimate partner violence:    Fear of current or ex partner: Not on file  Emotionally abused: Not on file    Physically abused: Not on file    Forced sexual activity: Not on file  Other Topics Concern  . Not on file  Social History Narrative   Lives with her granddaughter.   Left-handed.   No caffeine use.     PHYSICAL EXAM   Vitals:   05/18/18 1008  BP: 116/73  Pulse: (!) 55  Weight: 134 lb 8 oz (61 kg)  Height: 5' 3.5" (1.613 m)    Not recorded      Body mass index is 23.45 kg/m.  PHYSICAL EXAMNIATION:  Gen: NAD, conversant, well nourised, obese, well groomed                     Cardiovascular: Regular rate rhythm, no peripheral edema, warm, nontender. Eyes: Conjunctivae clear without exudates or hemorrhage Neck: Supple, no carotid bruits. Pulmonary: Clear to auscultation bilaterally   NEUROLOGICAL EXAM:  MENTAL STATUS: Speech:    Speech is normal; fluent and spontaneous with normal comprehension.  Cognition:     Orientation to time, place and person     Normal recent and remote memory     Normal Attention span and concentration     Normal Language, naming, repeating,spontaneous speech     Fund of knowledge   CRANIAL NERVES: CN II: Visual fields are full to confrontation. Fundoscopic exam is normal with sharp discs and no vascular changes. Pupils are round equal and briskly reactive to light. CN III, IV, VI: extraocular movement are normal. No ptosis. CN V: Facial sensation is intact to pinprick in all 3 divisions bilaterally. Corneal responses are intact.  CN VII: Face is symmetric with normal eye closure and smile. CN VIII: Hearing is normal to rubbing  fingers CN IX, X: Palate elevates symmetrically. Phonation is normal. CN XI: Head turning and shoulder shrug are intact CN XII: Tongue is midline with normal movements and no atrophy.  MOTOR: Left ankle dorsiflexion weakness 3/5, mild left plantar flexion weakness 4/5,  REFLEXES: Reflexes are 2+ and symmetric at the biceps, triceps, knees, and absent at ankles. Plantar responses are flexor.  SENSORY: Intact to light touch, pinprick, positional sensation and vibratory sensation are intact in fingers and toes.  COORDINATION: Rapid alternating movements and fine finger movements are intact. There is no dysmetria on finger-to-nose and heel-knee-shin.    GAIT/STANCE: She needs pushed up to get up from seated position, left foot drop, cautious, unsteady gait   DIAGNOSTIC DATA (LABS, IMAGING, TESTING) - I reviewed patient records, labs, notes, testing and imaging myself where available.   ASSESSMENT AND PLAN  Janecia Palau Stratton is a 77 y.o. 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 Ph.D.  [facility] [address] [place], [place] [address] Ph: [phone] Fax: [phone]  CC:  Barbie Banner, MD

## 2018-05-18 NOTE — Patient Instructions (Signed)
[facility]  Orthotics & Prosthetics Service  [address]  [phone]  

## 2018-05-18 NOTE — Telephone Encounter (Signed)
[employer] Medicare order sent to GI. No auth they will reach out to the pt to schedule.  °

## 2018-05-18 NOTE — Telephone Encounter (Addendum)
Patient arrived late to her new patient appointment and Dr. Terrace ArabiaYan was able to still see her.

## 2018-05-18 NOTE — Telephone Encounter (Signed)
No showed new patient appointment. 

## 2018-05-24 ENCOUNTER — Telehealth: Payer: Self-pay | Admitting: Neurology

## 2018-05-24 NOTE — Telephone Encounter (Signed)
[facility] [phone] the pt has neurostimulator and they cannot pull up enough information about it even with what the patient has told them. They need to know the name of it, [facility] was done at [facility]. The tech said most of those don't have the proper head coils and it would not be safe and therefore they are not going to be able to scan her. They cannot say it will or will not be safe but to be on the safe side they cannot scan her at this time. She has not had a MRI since getting the stimulator.  Maybe another test can be ordered.

## 2018-05-25 ENCOUNTER — Telehealth: Payer: Self-pay | Admitting: Neurology

## 2018-05-25 NOTE — Telephone Encounter (Signed)
Spoke to patient - she verbalized understanding.  She will keep her follow up planned in October with Dr. Terrace ArabiaYan.

## 2018-05-25 NOTE — Addendum Note (Signed)
Addended by: Levert FeinsteinYAN, Martasia Talamante on: 05/25/2018 07:38 AM   Modules accepted: Orders

## 2018-05-25 NOTE — Telephone Encounter (Signed)
Mrs. [name] had a spinal stimulator placement before, is not an MRI candidate, I have canceled her MRI of brain orders, please let patient know, she had a CAT scan already,  Keep follow-up on July 20, 2018

## 2018-06-01 ENCOUNTER — Telehealth: Payer: Self-pay | Admitting: Neurology

## 2018-06-01 NOTE — Telephone Encounter (Addendum)
Spoke to patient - states she is not having frequent dizzy spells but reports only having one event since seen on 05/26/18.  States she was at another doctor's appt and lying down for an exam.  When she sat up, she had a sudden onset of spinning dizziness.  It did not completely resolve for 10-15 minutes.  States her vitals were checked when she first arrived to the exam room and were fine.  Says they were not re-checked when her dizziness occurred.    She has not had her Echocardiogram yet.  Dr. Terrace Arabia would like this test completed.  Additionally, she was instructed to stay well hydrated, change positions slowly and try to check her vital signs (BP, pulse) during an active dizzy spell.  She was agreeable to this plan.    I will check with our referral dept to follow up on her ECHO.

## 2018-06-01 NOTE — Telephone Encounter (Signed)
Pt requesting a call stating she has been having very frequent dizzy spells again. Did not wish to discuss further with me. Please call to advise

## 2018-06-15 NOTE — Telephone Encounter (Signed)
Pt requesting a call to discuss scheduling her Echo. Please advise

## 2018-06-15 NOTE — Telephone Encounter (Signed)
Called and spoke to patient she is scheduled for her echo. 06/22/2018 arrive at 10:15 for 10:30

## 2018-06-22 ENCOUNTER — Encounter (INDEPENDENT_AMBULATORY_CARE_PROVIDER_SITE_OTHER): Payer: Self-pay

## 2018-06-22 ENCOUNTER — Other Ambulatory Visit: Payer: Self-pay

## 2018-06-22 ENCOUNTER — Telehealth: Payer: Self-pay | Admitting: Neurology

## 2018-06-22 ENCOUNTER — Ambulatory Visit (HOSPITAL_COMMUNITY): Payer: Medicare Other | Attending: Neurology

## 2018-06-22 DIAGNOSIS — I080 Rheumatic disorders of both mitral and aortic valves: Secondary | ICD-10-CM | POA: Diagnosis not present

## 2018-06-22 DIAGNOSIS — M21372 Foot drop, left foot: Secondary | ICD-10-CM

## 2018-06-22 DIAGNOSIS — N189 Chronic kidney disease, unspecified: Secondary | ICD-10-CM | POA: Insufficient documentation

## 2018-06-22 DIAGNOSIS — G459 Transient cerebral ischemic attack, unspecified: Secondary | ICD-10-CM | POA: Diagnosis present

## 2018-06-22 DIAGNOSIS — I2510 Atherosclerotic heart disease of native coronary artery without angina pectoris: Secondary | ICD-10-CM | POA: Diagnosis not present

## 2018-06-22 DIAGNOSIS — I1310 Hypertensive heart and chronic kidney disease without heart failure, with stage 1 through stage 4 chronic kidney disease, or unspecified chronic kidney disease: Secondary | ICD-10-CM | POA: Insufficient documentation

## 2018-06-22 DIAGNOSIS — E785 Hyperlipidemia, unspecified: Secondary | ICD-10-CM | POA: Insufficient documentation

## 2018-06-22 DIAGNOSIS — R269 Unspecified abnormalities of gait and mobility: Secondary | ICD-10-CM | POA: Diagnosis not present

## 2018-06-22 NOTE — Telephone Encounter (Signed)
Spoke to patient - she is aware of her results. 

## 2018-06-22 NOTE — Telephone Encounter (Signed)
No significant abnormality on echocardiogram

## 2018-07-16 ENCOUNTER — Emergency Department (HOSPITAL_BASED_OUTPATIENT_CLINIC_OR_DEPARTMENT_OTHER): Payer: Medicare Other

## 2018-07-16 ENCOUNTER — Emergency Department (HOSPITAL_BASED_OUTPATIENT_CLINIC_OR_DEPARTMENT_OTHER)
Admission: EM | Admit: 2018-07-16 | Discharge: 2018-07-16 | Disposition: A | Discharge status: Home / Self Care | Payer: Medicare Other | Attending: Emergency Medicine | Admitting: Emergency Medicine

## 2018-07-16 ENCOUNTER — Other Ambulatory Visit: Payer: Self-pay

## 2018-07-16 ENCOUNTER — Encounter (HOSPITAL_BASED_OUTPATIENT_CLINIC_OR_DEPARTMENT_OTHER): Payer: Self-pay | Admitting: Adult Health

## 2018-07-16 DIAGNOSIS — Z96642 Presence of left artificial hip joint: Secondary | ICD-10-CM | POA: Insufficient documentation

## 2018-07-16 DIAGNOSIS — Z7982 Long term (current) use of aspirin: Secondary | ICD-10-CM | POA: Insufficient documentation

## 2018-07-16 DIAGNOSIS — Z79899 Other long term (current) drug therapy: Secondary | ICD-10-CM | POA: Diagnosis not present

## 2018-07-16 DIAGNOSIS — S0003XA Contusion of scalp, initial encounter: Secondary | ICD-10-CM | POA: Diagnosis not present

## 2018-07-16 DIAGNOSIS — Y9301 Activity, walking, marching and hiking: Secondary | ICD-10-CM | POA: Diagnosis not present

## 2018-07-16 DIAGNOSIS — N189 Chronic kidney disease, unspecified: Secondary | ICD-10-CM | POA: Diagnosis not present

## 2018-07-16 DIAGNOSIS — I129 Hypertensive chronic kidney disease with stage 1 through stage 4 chronic kidney disease, or unspecified chronic kidney disease: Secondary | ICD-10-CM | POA: Diagnosis not present

## 2018-07-16 DIAGNOSIS — W0110XA Fall on same level from slipping, tripping and stumbling with subsequent striking against unspecified object, initial encounter: Secondary | ICD-10-CM | POA: Diagnosis not present

## 2018-07-16 DIAGNOSIS — S0990XA Unspecified injury of head, initial encounter: Secondary | ICD-10-CM | POA: Diagnosis present

## 2018-07-16 DIAGNOSIS — W19XXXA Unspecified fall, initial encounter: Secondary | ICD-10-CM

## 2018-07-16 DIAGNOSIS — I2510 Atherosclerotic heart disease of native coronary artery without angina pectoris: Secondary | ICD-10-CM | POA: Insufficient documentation

## 2018-07-16 DIAGNOSIS — Y929 Unspecified place or not applicable: Secondary | ICD-10-CM | POA: Diagnosis not present

## 2018-07-16 DIAGNOSIS — Y999 Unspecified external cause status: Secondary | ICD-10-CM | POA: Insufficient documentation

## 2018-07-16 NOTE — Discharge Instructions (Signed)
You are seen in the ER after a fall.  Your head CT did not show any bleeding, fractures.  Ice your scalp, you may have some intermittent headaches after the fall.  You can take ibuprofen or acetaminophen for this.  Return to the ER for severe persistent headache, vision changes, dizziness, stroke symptoms, numbness or weakness to your extremities, loss of consciousness.

## 2018-07-16 NOTE — ED Provider Notes (Signed)
[facility] EMERGENCY DEPARTMENT Provider Note   CSN: [id number] Arrival date & time: 07/16/18  1217     History   Chief Complaint Chief Complaint  Patient presents with  . Fall    HPI [name] is a 77 y.o. female with history of cerebral aneurysm, left foot drop, chronic back pain is here for evaluation of injury sustained after a fall.  Patient states that she was getting out of the chair and tripped over herself falling onto her left side onto the concrete floor.  She hit the left side of her scalp.  She reports mild local pain and swelling.  She had brief episode of dizziness immediately after the fall but this has resolved.  She takes aspirin daily.  No other anticoagulants.  She denies prodromal symptoms including lightheadedness, chest pain, shortness of breath, palpitations.  She has been ambulatory since the fall.  She denies LOC, vision changes, nausea, vomiting, headache, neck pain, back pain, numbness or weakness to extremities.  HPI  Past Medical History:  Diagnosis Date  . Arthritis   . Chest pain   . Chronic back pain    "neurostimulator electrode leads overlie the lower thoracic spinal canal" - per cxr report 07/10/14  . Chronic kidney disease    kidney stone  . Coronary artery disease    NONOBSTRUCTIVE  . Depression   . Dizziness   . GERD (gastroesophageal reflux disease)   . Headache   . History of kidney stones   . Hyperlipidemia   . Hypertension   . Left foot drop    SINCE 2009 - RELATED TO BACK PROBLEM - WEARS BRACE  . PONV (postoperative nausea and vomiting)   . Spinal stenosis   . Thyroid disease    hypothryoid    Patient Active Problem List   Diagnosis Date Noted  . Left foot drop 05/18/2018  . TIA (transient ischemic attack) 05/18/2018  . Gait abnormality 05/18/2018  . 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 Hypertension   . Hyperlipidemia   . Coronary artery disease   . Spinal stenosis     Past Surgical History:  Procedure  Laterality Date  . BACK SURGERY    . CARDIAC CATHETERIZATION  02/22/2009   EF 60%  . CATARACT EXTRACTION     both eyes  . COLONOSCOPY    . CYSTOSCOPY     FOR KIDNEY STONE  . LUMBAR LAMINECTOMY/DECOMPRESSION MICRODISCECTOMY    . TOTAL HIP ARTHROPLASTY Left 07/17/2014   Procedure: LEFT TOTAL HIP ARTHROPLASTY ANTERIOR APPROACH;  Surgeon: Shelda Pal, MD;  Location: [facility];  Service: Orthopedics;  Laterality: Left;     OB History   None      Home Medications    Prior to Admission medications   Medication Sig Start Date End Date Taking? Authorizing Provider  aspirin 81 MG tablet Take 81 mg by mouth daily.    [provider]  Calcium Carbonate-Vit D-Min (CALCIUM 1200 PO) Take 1 tablet by mouth 2 (two) times daily.     [provider]  doxazosin (CARDURA) 8 MG tablet Take 8 mg by mouth every morning.     [provider]  ferrous sulfate 325 (65 FE) MG tablet Take 1 tablet (325 mg total) by mouth 3 (three) times daily after meals. 07/19/14   Lanney Gins, PA-C  isosorbide mononitrate (IMDUR) 60 MG 24 hr tablet Take 60 mg by mouth every morning.     [provider]  levothyroxine (SYNTHROID,  LEVOTHROID) 25 MCG tablet Take by mouth daily. 05/22/15   [provider]  lidocaine (LIDODERM) 5 % Place 1 patch onto the skin daily. Remove & Discard patch within 12 hours or as directed by MD 10/21/17   Shaune Pollack, MD  meclizine (ANTIVERT) 12.5 MG tablet Take 1 tablet (12.5 mg total) by mouth 3 (three) times daily as needed for dizziness. 05/14/18   Charlynne Pander, MD  metoCLOPramide (REGLAN) 10 MG tablet Take 1 tablet (10 mg total) by mouth every 6 (six) hours as needed for nausea (nausea/headache). 05/14/18   Charlynne Pander, MD  Multiple Vitamin (MULTIVITAMIN) tablet Take 1 tablet by mouth every morning.     [provider]  sertraline (ZOLOFT) 100 MG tablet Take 200 mg by mouth every morning.     [provider]    simvastatin (ZOCOR) 80 MG tablet Take 80 mg by mouth every morning.     [provider]    Family History Family History  Problem Relation Age of Onset  . Cancer Mother        started in gallbladder then spread to liver  . Heart attack Father   . Colon cancer Neg Hx   . Esophageal cancer Neg Hx   . Rectal cancer Neg Hx   . Stomach cancer Neg Hx     Social History Social History   Tobacco Use  . Smoking status: Never Smoker  . Smokeless tobacco: Never Used  Substance Use Topics  . Alcohol use: No    Alcohol/week: 0.0 standard drinks  . Drug use: No     Allergies   Patient has no known allergies.   Review of Systems Review of Systems  HENT:       Scalp contusion   All other systems reviewed and are negative.    Physical Exam Updated Vital Signs BP (!) 144/69 (BP Location: Left Arm)   Pulse 61   Temp 98.3 F (36.8 C) (Oral)   Resp 16   Ht 5\' 3"  (1.6 m)   Wt 61.2 kg   SpO2 100%   BMI 23.91 kg/m   Physical Exam  Constitutional: She is oriented to person, place, and time. She appears well-developed and well-nourished. No distress.  NAD.  HENT:  Head: Normocephalic. Head is with contusion.  Right Ear: External ear normal.  Left Ear: External ear normal.  Nose: Nose normal.  Contusion to left top scalp with abrasion, no laceration. No facial, nasal bone tenderness. No hemotympanum. No battle's sign. No racoon's sign. No intranasal bleeding, septum midline. No intraoral bleeding or lesions.   Eyes: Conjunctivae and EOM are normal. No scleral icterus.  Neck: Normal range of motion. Neck supple.  c-spine: no midline tenderness  Cardiovascular: Normal rate, regular rhythm and normal heart sounds.  No murmur heard. Pulmonary/Chest: Effort normal and breath sounds normal. She has no wheezes.  Musculoskeletal: Normal range of motion. She exhibits no deformity.  TL spine: no midline tenderness. No paraspinal muscle tenderness.   Neurological: She is  alert and oriented to person, place, and time.  Alert and oriented to self, place, time and event.  Speech is fluent without obvious dysarthria or aphasia. Strength 5/5 in upper and lower extremities   Sensation to light touch intact in bilateral face, upper and lower extremities No truncal sway. No pronator drift. No leg drop.  Normal finger-to-nose and finger tapping.  CN II-XII grossly intact bilaterally.   Skin: Skin is warm and dry. Capillary refill takes  less than 2 seconds.  Psychiatric: She has a normal mood and affect. Her behavior is normal. Judgment and thought content normal.  Nursing note and vitals reviewed.    ED Treatments / Results  Labs (all labs ordered are listed, but only abnormal results are displayed) Labs Reviewed - No data to display  EKG None  Radiology Ct Head Wo Contrast  Result Date: 07/16/2018 CLINICAL DATA:  The patient fell out of a chair today resulting in a blow to the left side of the head. Initial encounter. EXAM: CT HEAD WITHOUT CONTRAST TECHNIQUE: Contiguous axial images were obtained from the base of the skull through the vertex without intravenous contrast. COMPARISON:  Head CT 05/14/2018. FINDINGS: Brain: No evidence of acute infarction, hemorrhage, hydrocephalus, extra-axial collection or mass lesion/mass effect. Vascular: No hyperdense vessel or unexpected calcification. Skull: Normal. Negative for fracture or focal lesion. Sinuses/Orbits: Status post cataract surgery.  Otherwise negative. Other: None. IMPRESSION: Negative head CT. Electronically Signed   By: Drusilla Kanner M.D.   On: 07/16/2018 13:32    Procedures Procedures (including critical care time)  Medications Ordered in ED Medications - No data to display   Initial Impression / Assessment and Plan / ED Course  I have reviewed the triage vital signs and the nursing notes.  Pertinent labs & imaging results that were available during my care of the patient were reviewed by me  and considered in my medical decision making (see chart for details).     77 y.o. yo female here after mechanical fall.  Reports pain to left scalp where she has a contusion.  HD stable on arrival.  Alert. No obvious signs of significant CTL-spine. Given symptoms, age, risk, known history of tiny aneurysm will obtain imaging.    Head CT is normal.  Results given to patients. Return precautions given. Symptomatic tx and f/u with PCP for re-evaluation for persistent symptoms. Pt in agreement with ER tx and discharge plan.    Final Clinical Impressions(s) / ED Diagnoses   Final diagnoses:  Fall, initial encounter  Contusion of scalp, initial encounter    ED Discharge Orders    None       Liberty Handy, PA-C 07/16/18 1519    Linwood Dibbles, MD 07/17/18 [phone]

## 2018-07-16 NOTE — ED Triage Notes (Signed)
PResents with  A fall from sitting, she reprots that she was trying to get up out of a chair and lost her footing and fell backwards onto a concrete floor. She denies LOC. SHe reports that she feels dizzy at this time. She is currently taking 81 mg of ASA daily.

## 2018-07-20 ENCOUNTER — Other Ambulatory Visit: Payer: Self-pay | Admitting: *Deleted

## 2018-07-20 ENCOUNTER — Encounter: Payer: Self-pay | Admitting: Neurology

## 2018-07-20 ENCOUNTER — Ambulatory Visit: Payer: Medicare Other | Admitting: Neurology

## 2018-07-20 VITALS — BP 108/61 | HR 72 | Ht 63.0 in | Wt 139.0 lb

## 2018-07-20 DIAGNOSIS — I679 Cerebrovascular disease, unspecified: Secondary | ICD-10-CM

## 2018-07-20 DIAGNOSIS — M21372 Foot drop, left foot: Secondary | ICD-10-CM

## 2018-07-20 DIAGNOSIS — R269 Unspecified abnormalities of gait and mobility: Secondary | ICD-10-CM

## 2018-07-20 NOTE — Progress Notes (Signed)
PATIENT: [name] DOB: 1941/04/28  Chief Complaint  Patient presents with  . Vertigo/left foot drop    She is here with her daughter, [name]. She would like to review her ECHO.  She has a spinal cord stimulator and is unable to have MRI scans.  She has not started PT yet.  She is interested in getting an AFO brace for her left foot drop.     HISTORICAL  [name] is a 77 year old female, seen in request by her primary care physician Dr. Andrey Campanile, Merlyn Albert for evaluation of sudden onset dizziness, gait abnormality, she is accompanied by her daughter [name], granddaughter Morrie Sheldon are at today's clinical visit.  Initial evaluation was on May 18, 2018.  She had a past medical history of hyperlipidemia, hypertension, depression, her husband passed away in 2017/12/17she now lives with her granddaughter, she had a history of left lumbar radiculopathy, status decompression surgery in the past, with residual left foot drop, gait abnormality,  On May 11, 2018, she woke up early morning around 6 AM using bathroom, noticed unsteady gait, vertigo, room was spinning, nauseous, she was able to manage to use the bathroom, lying back to sleep again, few hours later, when she tried to get up again, symptoms still present, but had mild improvement, she has been symptomatic since his onset, few days later, when she went out shopping with her sister on May 14, 2018, she suddenly felt nauseous, dizziness, has to sit out waiting for her sister, ambulance was called, she was evaluated at emergency room,  I personally reviewed CTA head and neck on May 14, 2018, 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  Echocardiogram on June 22, 2018 showed ejection fraction 55 to 60%, wall motion was normal,  Laboratory evaluation August 2019, negative troponin, CMP showed elevated glucose 117, showed hemoglobin of 11.7,  She continues to have left foot drop, unsteady gait REVIEW OF SYSTEMS: Full 14 system review of systems performed and notable only for headache, black stool, eye pain, runny nose  ALLERGIES: Allergies  Allergen Reactions  . Nsaids Other (See Comments)    GI upset    HOME MEDICATIONS: Current Outpatient Medications  Medication Sig Dispense Refill  . aspirin 81 MG tablet Take 81 mg by mouth daily.    . Calcium Carbonate-Vit D-Min (CALCIUM 1200 PO) Take 1 tablet by mouth 2 (two) times daily.     Marland Kitchen doxazosin (CARDURA) 8 MG tablet Take 8 mg by mouth every morning.     . IRON PO Take 1 tablet by mouth daily.    . isosorbide mononitrate (IMDUR) 60 MG 24 hr tablet Take 60 mg by mouth every morning.     Marland Kitchen levothyroxine (SYNTHROID, LEVOTHROID) 25 MCG tablet Take by mouth daily.    Marland Kitchen lidocaine (LIDODERM) 5 % Place 1 patch onto the skin daily. Remove & Discard patch within 12 hours or as directed by MD 30 patch 0  . meclizine (ANTIVERT) 12.5 MG tablet Take 1 tablet (12.5 mg total) by mouth 3 (three) times daily  as needed for dizziness. 30 tablet 0  . metoCLOPramide (REGLAN) 10 MG tablet Take 1 tablet (10 mg total) by mouth every 6 (six) hours as needed for nausea (nausea/headache). 10 tablet 0  . Multiple Vitamin (MULTIVITAMIN) tablet Take 1 tablet by mouth every morning.     . sertraline (ZOLOFT) 100 MG tablet Take 200 mg by mouth every morning.     . simvastatin (ZOCOR) 80 MG tablet Take 80 mg by mouth every morning.      No current facility-administered medications for this visit.     PAST MEDICAL HISTORY: Past  Medical History:  Diagnosis Date  . Arthritis   . Chest pain   . Chronic back pain    "neurostimulator electrode leads overlie the lower thoracic spinal canal" - per cxr report 07/10/14  . Chronic kidney disease    kidney stone  . Coronary artery disease    NONOBSTRUCTIVE  . Depression   . Dizziness   . GERD (gastroesophageal reflux disease)   . Headache   . History of kidney stones   . Hyperlipidemia   . Hypertension   . Left foot drop    SINCE 2009 - RELATED TO BACK PROBLEM - WEARS BRACE  . PONV (postoperative nausea and vomiting)   . Spinal stenosis   . Thyroid disease    hypothryoid    PAST SURGICAL HISTORY: Past Surgical History:  Procedure Laterality Date  . BACK SURGERY    . CARDIAC CATHETERIZATION  02/22/2009   EF 60%  . CATARACT EXTRACTION     both eyes  . COLONOSCOPY    . CYSTOSCOPY     FOR KIDNEY STONE  . LUMBAR LAMINECTOMY/DECOMPRESSION MICRODISCECTOMY    . TOTAL HIP ARTHROPLASTY Left 07/17/2014   Procedure: LEFT TOTAL HIP ARTHROPLASTY ANTERIOR APPROACH;  Surgeon: Shelda Pal, MD;  Location: [facility];  Service: Orthopedics;  Laterality: Left;    FAMILY HISTORY: Family History  Problem Relation Age of Onset  . Cancer Mother        started in gallbladder then spread to liver  . Heart attack Father   . Colon cancer Neg Hx   . Esophageal cancer Neg Hx   . Rectal cancer Neg Hx   . Stomach cancer Neg Hx     SOCIAL HISTORY: Social History   Socioeconomic History  . Marital status: Widowed    Spouse name: Not on file  . Number of children: 2  . Years of education: 73  . Highest education level: High school graduate  Occupational History  . Occupation: Retired  Engineer, production  . Financial resource strain: Not on file  . Food insecurity:    Worry: Not on file    Inability: Not on file  . Transportation needs:    Medical: Not on file    Non-medical: Not on file  Tobacco Use  . Smoking status: Never Smoker  . Smokeless tobacco: Never Used    Substance and Sexual Activity  . Alcohol use: No    Alcohol/week: 0.0 standard drinks  . Drug use: No  . Sexual activity: Not on file  Lifestyle  . Physical activity:    Days per week: Not on file    Minutes per session: Not on file  . Stress: Not on file  Relationships  . Social connections:    Talks on phone: Not on file    Gets together: Not on file    Attends religious service: Not on file    Active member of  club or organization: Not on file    Attends meetings of clubs or organizations: Not on file    Relationship status: Not on file  . Intimate partner violence:    Fear of current or ex partner: Not on file    Emotionally abused: Not on file    Physically abused: Not on file    Forced sexual activity: Not on file  Other Topics Concern  . Not on file  Social History Narrative   Lives with her granddaughter.   Left-handed.   No caffeine use.     PHYSICAL EXAM   Vitals:   07/20/18 0944  BP: 108/61  Pulse: 72  Weight: 139 lb (63 kg)  Height: 5\' 3"  (1.6 m)    Not recorded      Body mass index is 24.62 kg/m.  PHYSICAL EXAMNIATION:  Gen: NAD, conversant, well nourised, obese, well groomed                     Cardiovascular: Regular rate rhythm, no peripheral edema, warm, nontender. Eyes: Conjunctivae clear without exudates or hemorrhage Neck: Supple, no carotid bruits. Pulmonary: Clear to auscultation bilaterally   NEUROLOGICAL EXAM:  MENTAL STATUS: Speech:    Speech is normal; fluent and spontaneous with normal comprehension.  Cognition:     Orientation to time, place and person     Normal recent and remote memory     Normal Attention span and concentration     Normal Language, naming, repeating,spontaneous speech     Fund of knowledge   CRANIAL NERVES: CN II: Visual fields are full to confrontation. Fundoscopic exam is normal with sharp discs and no vascular changes. Pupils are round equal and briskly reactive to light. CN III, IV, VI:  extraocular movement are normal. No ptosis. CN V: Facial sensation is intact to pinprick in all 3 divisions bilaterally. Corneal responses are intact.  CN VII: Face is symmetric with normal eye closure and smile. CN VIII: Hearing is normal to rubbing fingers CN IX, X: Palate elevates symmetrically. Phonation is normal. CN XI: Head turning and shoulder shrug are intact CN XII: Tongue is midline with normal movements and no atrophy.  MOTOR: Left ankle dorsiflexion weakness 3/5, mild left plantar flexion weakness 4/5,  REFLEXES: Reflexes are 2+ and symmetric at the biceps, triceps, knees, and absent at ankles. Plantar responses are flexor.  SENSORY: Intact to light touch, pinprick   COORDINATION: Rapid alternating movements and fine finger movements are intact. There is no dysmetria on finger-to-nose and heel-knee-shin.    GAIT/STANCE: She needs pushed up to get up from seated position, left foot drop, cautious, unsteady gait   DIAGNOSTIC DATA (LABS, IMAGING, TESTING) - I reviewed patient records, labs, notes, testing and imaging myself where available.   ASSESSMENT AND PLAN  Kymorah Korf Bickhart is a 77 y.o. female   Acute onset of vertigo, unsteady gait,  Most suggestive of brainstem/cerebellum TIA  Patient cannot have MRI due to previous spinal cord stimulator placement,  CT head without contrast that showed small vessel disease no acute abnormality  Continue aspirin 81 mg daily  Echocardiogram showed ejection fraction 55 to 60%  Incidental findings of 2 mm right MCA aneurysm  Continue to observe her symptoms, asymptomatic now  Left foot drop,  Residual deficit from left lumbar radiculopathy,  Refer her to physical therapy,  Left AFO Rx  Levert Feinstein, M.D. Ph.D.  [facility] [address] [place], [place] [address] Ph: [phone]  Fax: [phone]  CC:  Barbie Banner, MD

## 2018-07-28 ENCOUNTER — Ambulatory Visit: Payer: Medicare Other | Admitting: Rehabilitative and Restorative Service Providers"

## 2019-08-30 ENCOUNTER — Ambulatory Visit: Payer: Medicare Other | Admitting: Neurology

## 2019-09-18 ENCOUNTER — Encounter: Payer: Self-pay | Admitting: Neurology

## 2019-09-18 ENCOUNTER — Other Ambulatory Visit: Payer: Self-pay

## 2019-09-18 ENCOUNTER — Ambulatory Visit: Payer: Medicare Other | Admitting: Neurology

## 2019-09-18 VITALS — BP 142/82 | HR 67 | Temp 96.6°F | Ht 63.5 in | Wt 130.2 lb

## 2019-09-18 DIAGNOSIS — I679 Cerebrovascular disease, unspecified: Secondary | ICD-10-CM | POA: Diagnosis not present

## 2019-09-18 DIAGNOSIS — M21372 Foot drop, left foot: Secondary | ICD-10-CM | POA: Diagnosis not present

## 2019-09-18 NOTE — Patient Instructions (Signed)
Please remain on aspirin 81 mg daily   Continue follow-up with your primary doctor   Return in 1 year or sooner if needed

## 2019-09-18 NOTE — Progress Notes (Signed)
PATIENT: [name] DOB: 03/04/1941  REASON FOR VISIT: follow up HISTORY FROM: patient  HISTORY OF PRESENT ILLNESS: Today 09/18/19  HISTORY Jocelyn Lowesancy P Spero GeraldsFriddle is a 78 year old female, seen in request by her primary care physician Dr. Andrey CampanileWilson, Merlyn AlbertFred for evaluation of sudden onset dizziness, gait abnormality, she is accompanied by her daughter [name], granddaughter [name] are at today's clinical visit.  Initial evaluation was on May 18, 2018.  She had a past medical history of hyperlipidemia, hypertension, depression, her husband passed away in December 2017, she now lives with her granddaughter, she had a history of left lumbar radiculopathy, status decompression surgery in the past, with residual left foot drop, gait abnormality,  On May 11, 2018, she woke up early morning around 6 AM using bathroom, noticed unsteady gait, vertigo, room was spinning, nauseous, she was able to manage to use the bathroom, lying back to sleep again, few hours later, when she tried to get up again, symptoms still present, but had mild improvement, she has been symptomatic since his onset, few days later, when she went out shopping with her sister on May 14, 2018, she suddenly felt nauseous, dizziness, has to sit out waiting for her sister, ambulance was called, she was evaluated at emergency room,  I personally reviewed CTA head and neck on May 14, 2018, 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  Echocardiogram on June 22, 2018  showed ejection fraction 55 to 60%, wall motion was normal,  Laboratory evaluation August 2019, negative troponin, CMP showed elevated glucose 117, showed hemoglobin of 11.7,  She continues to have left foot drop, unsteady gait  Update September 19, 2019 SS: She was last seen in October 2019.  She indicates she has been doing well.  She has not had any falls.  She wears her left AFO brace daily.  The brace has provided more gait stability.  She does not require an assistive device.  She remains on daily 81 mg aspirin.  She denies any episodes of dizziness.  She lives with her granddaughter, but is able to perform all of her own ADLs.  She drives a car without difficulty.  She denies any health problems or concerns.  She has close follow-up with her primary doctor.  She is requesting a handicap sticker paper work be filled out.   REVIEW OF SYSTEMS: Out of a complete 14 system review of symptoms, the patient complains only of the following symptoms, and all other reviewed systems are negative.  Gait abnormality   ALLERGIES: Allergies  Allergen Reactions  . Nsaids Other (See Comments)    GI upset    HOME MEDICATIONS: Outpatient Medications Prior to Visit  Medication Sig Dispense Refill  . aspirin 81 MG tablet Take 81 mg by mouth daily.    . Calcium Carbonate-Vit D-Min (CALCIUM 1200 PO) Take 1 tablet by mouth 2 (two) times daily.     Marland Kitchen. doxazosin (CARDURA) 8 MG tablet Take 8 mg by mouth every morning.     Marland Kitchen. DOXYCYCLINE CALCIUM PO Take by mouth daily.    .Marland Kitchen  IRON PO Take 1 tablet by mouth daily.    . isosorbide mononitrate (IMDUR) 60 MG 24 hr tablet Take 60 mg by mouth every morning.     Marland Kitchen levothyroxine (SYNTHROID, LEVOTHROID) 25 MCG tablet Take by mouth daily.    Marland Kitchen LORazepam (ATIVAN) 0.5 MG tablet Take 0.25-0.5 mg by mouth daily as needed.    . Multiple Vitamin (MULTIVITAMIN) tablet Take 1 tablet by mouth every morning.     . sertraline (ZOLOFT) 100 MG tablet Take 200 mg by mouth every  morning.     . simvastatin (ZOCOR) 80 MG tablet Take 80 mg by mouth every morning.     . lidocaine (LIDODERM) 5 % Place 1 patch onto the skin daily. Remove & Discard patch within 12 hours or as directed by MD (Patient not taking: Reported on 09/18/2019) 30 patch 0  . meclizine (ANTIVERT) 12.5 MG tablet Take 1 tablet (12.5 mg total) by mouth 3 (three) times daily as needed for dizziness. (Patient not taking: Reported on 09/18/2019) 30 tablet 0  . metoCLOPramide (REGLAN) 10 MG tablet Take 1 tablet (10 mg total) by mouth every 6 (six) hours as needed for nausea (nausea/headache). (Patient not taking: Reported on 09/18/2019) 10 tablet 0   No facility-administered medications prior to visit.    PAST MEDICAL HISTORY: Past Medical History:  Diagnosis Date  . Arthritis   . Chest pain   . Chronic back pain    "neurostimulator electrode leads overlie the lower thoracic spinal canal" - per cxr report 07/10/14  . Chronic kidney disease    kidney stone  . Coronary artery disease    NONOBSTRUCTIVE  . Depression   . Dizziness   . GERD (gastroesophageal reflux disease)   . Headache   . History of kidney stones   . Hyperlipidemia   . Hypertension   . Left foot drop    SINCE 2009 - RELATED TO BACK PROBLEM - WEARS BRACE  . PONV (postoperative nausea and vomiting)   . Spinal stenosis   . Thyroid disease    hypothryoid    PAST SURGICAL HISTORY: Past Surgical History:  Procedure Laterality Date  . BACK SURGERY    . CARDIAC CATHETERIZATION  02/22/2009   EF 60%  . CATARACT EXTRACTION     both eyes  . COLONOSCOPY    . CYSTOSCOPY     FOR KIDNEY STONE  . LUMBAR LAMINECTOMY/DECOMPRESSION MICRODISCECTOMY    . TOTAL HIP ARTHROPLASTY Left 07/17/2014   Procedure: LEFT TOTAL HIP ARTHROPLASTY ANTERIOR APPROACH;  Surgeon: Shelda Pal, MD;  Location: [facility];  Service: Orthopedics;  Laterality: Left;    FAMILY HISTORY: Family History  Problem Relation Age of Onset  . Cancer Mother        started  in gallbladder then spread to liver  . Heart attack Father   . Colon cancer Neg Hx   . Esophageal cancer Neg Hx   . Rectal cancer Neg Hx   . Stomach cancer Neg Hx     SOCIAL HISTORY: Social History   Socioeconomic History  . Marital status: Widowed    Spouse name: Not on file  . Number of children: 2  . Years of education: 82  . Highest education level: High school graduate  Occupational History  . Occupation: Retired  Tobacco Use  . Smoking status: Never Smoker  . Smokeless tobacco: Never Used  Substance and Sexual Activity  . Alcohol use: No    Alcohol/week: 0.0 standard drinks  . Drug use:  No  . Sexual activity: Not on file  Other Topics Concern  . Not on file  Social History Narrative   Lives with her granddaughter.   Left-handed.   No caffeine use.   Social Determinants of Health   Financial Resource Strain:   . Difficulty of Paying Living Expenses: Not on file  Food Insecurity:   . Worried About Charity fundraiser in the Last Year: Not on file  . Ran Out of Food in the Last Year: Not on file  Transportation Needs:   . Lack of Transportation (Medical): Not on file  . Lack of Transportation (Non-Medical): Not on file  Physical Activity:   . Days of Exercise per Week: Not on file  . Minutes of Exercise per Session: Not on file  Stress:   . Feeling of Stress : Not on file  Social Connections:   . Frequency of Communication with Friends and Family: Not on file  . Frequency of Social Gatherings with Friends and Family: Not on file  . Attends Religious Services: Not on file  . Active Member of Clubs or Organizations: Not on file  . Attends Archivist Meetings: Not on file  . Marital Status: Not on file  Intimate Partner Violence:   . Fear of Current or Ex-Partner: Not on file  . Emotionally Abused: Not on file  . Physically Abused: Not on file  . Sexually Abused: Not on file   PHYSICAL EXAM  Vitals:   09/18/19 0807  BP: (!) 142/82  Pulse:  67  Temp: (!) 96.6 F (35.9 C)  Weight: 130 lb 3.2 oz (59.1 kg)  Height: 5' 3.5" (1.613 m)   Body mass index is 22.7 kg/m.  Generalized: Well developed, in no acute distress   Neurological examination  Mentation: Alert oriented to time, place, history taking. Follows all commands speech and language fluent Cranial nerve II-XII: Pupils were equal round reactive to light. Extraocular movements were full, visual field were full on confrontational test. Facial sensation and strength were normal.  Head turning and shoulder shrug  were normal and symmetric. Motor: The motor testing reveals 5 over 5 strength of all 4 extremities. Good symmetric motor tone is noted throughout.  Sensory: Sensory testing is intact to soft touch on all 4 extremities. No evidence of extinction is noted.  Coordination: Cerebellar testing reveals good finger-nose-finger and heel-to-shin bilaterally.  Gait and station: Wearing left AFO brace, intentional stepping on the left. Tandem gait is mildly unsteady. Romberg is negative. No drift is seen.  Reflexes: Deep tendon reflexes are symmetric and normal bilaterally.   DIAGNOSTIC DATA (LABS, IMAGING, TESTING) - I reviewed patient records, labs, notes, testing and imaging myself where available.  Lab Results  Component Value Date   WBC 6.1 05/14/2018   HGB 11.7 (L) 05/14/2018   HCT 34.6 (L) 05/14/2018   MCV 94.0 05/14/2018   PLT 154 05/14/2018      Component Value Date/Time   NA 140 05/14/2018 1624   K 4.1 05/14/2018 1624   CL 106 05/14/2018 1624   CO2 26 05/14/2018 1624   GLUCOSE 117 (H) 05/14/2018 1624   BUN 19 05/14/2018 1624   CREATININE 0.69 05/14/2018 1624   CALCIUM 9.1 05/14/2018 1624   PROT 6.7 05/14/2018 1624   ALBUMIN 4.0 05/14/2018 1624   AST 25 05/14/2018 1624   ALT 15 05/14/2018 1624   ALKPHOS 98 05/14/2018 1624   BILITOT 0.4 05/14/2018 1624   GFRNONAA >60 05/14/2018 1624  GFRAA >60 05/14/2018 1624   No results found for: CHOL, HDL,  LDLCALC, LDLDIRECT, TRIG, CHOLHDL Lab Results  Component Value Date   HGBA1C  02/21/2009    5.7 (NOTE) The ADA recommends the following therapeutic goal for glycemic control related to Hgb A1c measurement: Goal of therapy: <6.5 Hgb A1c  Reference: American Diabetes Association: Clinical Practice Recommendations 2010, Diabetes Care, 2010, 33: (Suppl  1).     ASSESSMENT AND PLAN 78 y.o. year old female  has a past medical history of Arthritis, Chest pain, Chronic back pain, Chronic kidney disease, Coronary artery disease, Depression, Dizziness, GERD (gastroesophageal reflux disease), Headache, History of kidney stones, Hyperlipidemia, Hypertension, Left foot drop, PONV (postoperative nausea and vomiting), Spinal stenosis, and Thyroid disease. here with:  1.  Acute onset of vertigo, unsteady gait -No longer complains of vertigo, gait stability has improved with left AFO brace -Previously was most suggestive of brainstem/cerebellar TIA, but patient cannot have MRI due to spinal cord stimulator placement -CT head without contrast showed small vessel disease, no acute abnormality -Continue aspirin 81 mg daily -Echocardiogram showed EF 55 to 60% -Continue follow-up with primary doctor for management of vascular risk factors -Follow-up in 1 year with Dr. Terrace Arabia or sooner if needed   2.  Incidental finding of 2 mm right MCA aneurysm -She remains asymptomatic, continue to observe  3. Left foot drop  -Residual deficit from left lumbar radiculopathy -Well-controlled with AFO brace, providing gait stability, no falls -I will sign for handicap sticker   I spent 15 minutes with the patient. 50% of this time was spent discussing her plan of care.    Margie Ege, AGNP-C, DNP 09/18/2019, 8:30 AM [facility] [address] [place], [place] [address] [phone]

## 2020-07-21 ENCOUNTER — Emergency Department (HOSPITAL_BASED_OUTPATIENT_CLINIC_OR_DEPARTMENT_OTHER)
Admission: EM | Admit: 2020-07-21 | Discharge: 2020-07-21 | Disposition: A | Discharge status: Home / Self Care | Payer: Medicare HMO | Attending: Emergency Medicine | Admitting: Emergency Medicine

## 2020-07-21 ENCOUNTER — Other Ambulatory Visit: Payer: Self-pay

## 2020-07-21 ENCOUNTER — Emergency Department (HOSPITAL_BASED_OUTPATIENT_CLINIC_OR_DEPARTMENT_OTHER): Payer: Medicare HMO

## 2020-07-21 ENCOUNTER — Encounter (HOSPITAL_BASED_OUTPATIENT_CLINIC_OR_DEPARTMENT_OTHER): Payer: Self-pay | Admitting: Emergency Medicine

## 2020-07-21 DIAGNOSIS — Z79899 Other long term (current) drug therapy: Secondary | ICD-10-CM | POA: Diagnosis not present

## 2020-07-21 DIAGNOSIS — I129 Hypertensive chronic kidney disease with stage 1 through stage 4 chronic kidney disease, or unspecified chronic kidney disease: Secondary | ICD-10-CM | POA: Diagnosis not present

## 2020-07-21 DIAGNOSIS — Z7982 Long term (current) use of aspirin: Secondary | ICD-10-CM | POA: Insufficient documentation

## 2020-07-21 DIAGNOSIS — R42 Dizziness and giddiness: Secondary | ICD-10-CM | POA: Diagnosis present

## 2020-07-21 DIAGNOSIS — Z96642 Presence of left artificial hip joint: Secondary | ICD-10-CM | POA: Diagnosis not present

## 2020-07-21 DIAGNOSIS — I2510 Atherosclerotic heart disease of native coronary artery without angina pectoris: Secondary | ICD-10-CM | POA: Insufficient documentation

## 2020-07-21 DIAGNOSIS — E039 Hypothyroidism, unspecified: Secondary | ICD-10-CM | POA: Diagnosis not present

## 2020-07-21 DIAGNOSIS — N189 Chronic kidney disease, unspecified: Secondary | ICD-10-CM | POA: Diagnosis not present

## 2020-07-21 DIAGNOSIS — R110 Nausea: Secondary | ICD-10-CM | POA: Diagnosis not present

## 2020-07-21 LAB — URINALYSIS, ROUTINE W REFLEX MICROSCOPIC
Bilirubin Urine: NEGATIVE
Glucose, UA: NEGATIVE mg/dL
Hgb urine dipstick: NEGATIVE
Ketones, ur: NEGATIVE mg/dL
Leukocytes,Ua: NEGATIVE
Nitrite: NEGATIVE
Protein, ur: NEGATIVE mg/dL
Specific Gravity, Urine: >1.03 — ABNORMAL HIGH (ref 1.005–1.030)
pH: 5.5 (ref 5.0–8.0)

## 2020-07-21 LAB — CBC
HCT: 36.8 % (ref 36.0–46.0)
Hemoglobin: 11.9 g/dL — ABNORMAL LOW (ref 12.0–15.0)
MCH: 30.1 pg (ref 26.0–34.0)
MCHC: 32.3 g/dL (ref 30.0–36.0)
MCV: 92.9 fL (ref 80.0–100.0)
Platelets: 182 10*3/uL (ref 150–400)
RBC: 3.96 MIL/uL (ref 3.87–5.11)
RDW: 13.4 % (ref 11.5–15.5)
WBC: 5.4 10*3/uL (ref 4.0–10.5)
nRBC: 0 % (ref 0.0–0.2)

## 2020-07-21 LAB — BASIC METABOLIC PANEL
Anion gap: 10 (ref 5–15)
BUN: 19 mg/dL (ref 8–23)
CO2: 26 mmol/L (ref 22–32)
Calcium: 9.7 mg/dL (ref 8.9–10.3)
Chloride: 102 mmol/L (ref 98–111)
Creatinine, Ser: 0.67 mg/dL (ref 0.44–1.00)
GFR, Estimated: >60 mL/min (ref >60)
Glucose, Bld: 105 mg/dL — ABNORMAL HIGH (ref 70–99)
Potassium: 4 mmol/L (ref 3.5–5.1)
Sodium: 138 mmol/L (ref 135–145)

## 2020-07-21 MED ORDER — IOHEXOL 350 MG/ML SOLN
100.0000 mL | Freq: Once | INTRAVENOUS | Status: AC | PRN
  Administered 2020-07-21: 100 mL via INTRAVENOUS

## 2020-07-21 MED ORDER — SODIUM CHLORIDE 0.9 % IV BOLUS
1000.0000 mL | Freq: Once | INTRAVENOUS | Status: AC
  Administered 2020-07-21: 1000 mL via INTRAVENOUS

## 2020-07-21 NOTE — ED Provider Notes (Signed)
[facility] EMERGENCY DEPARTMENT Provider Note   CSN: [id number] Arrival date & time: 07/21/20  1548     History Chief Complaint  Patient presents with  . Dizziness    [name] is a 79 y.o. female.  She said she woke up this morning and her head was swimmy, she felt dizzy, and she did not have her balance.  Been intermittent throughout the day.  Seems to be somewhat positional with her head.  Also has a little bit of head pressure when she moves her head.  Associated with some nausea.  No blurry vision double vision numbness weakness chest pain shortness of breath.  She think she has been eating and drinking okay.  She had a fall a week ago injuring her left lower leg and elbow but states she did not hit her head.  Not on blood thinners.  The history is provided by the patient.  Dizziness Quality:  Lightheadedness and imbalance Severity:  Moderate Onset quality:  Sudden Timing:  Intermittent Progression:  Unchanged Chronicity:  New Context: head movement   Relieved by:  Nothing Worsened by:  Movement and turning head Ineffective treatments:  Being still Associated symptoms: nausea   Associated symptoms: no chest pain, no diarrhea, no hearing loss, no palpitations, no shortness of breath, no syncope, no tinnitus, no vision changes, no vomiting and no weakness   Risk factors: heart disease        Past Medical History:  Diagnosis Date  . Arthritis   . Chest pain   . Chronic back pain    "neurostimulator electrode leads overlie the lower thoracic spinal canal" - per cxr report 07/10/14  . Chronic kidney disease    kidney stone  . Coronary artery disease    NONOBSTRUCTIVE  . Depression   . Dizziness   . GERD (gastroesophageal reflux disease)   . Headache   . History of kidney stones   . Hyperlipidemia   . Hypertension   . Left foot drop    SINCE 2009 - RELATED TO BACK PROBLEM - WEARS BRACE  . PONV (postoperative nausea and vomiting)   . Spinal  stenosis   . Thyroid disease    hypothryoid    Patient Active Problem List   Diagnosis Date Noted  . 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 Left foot drop 05/18/2018  . TIA (transient ischemic attack) 05/18/2018  . Gait abnormality 05/18/2018  . 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 Hypertension   . Hyperlipidemia   . Coronary artery disease   . Spinal stenosis     Past Surgical History:  Procedure Laterality Date  . BACK SURGERY    . CARDIAC CATHETERIZATION  02/22/2009   EF 60%  . CATARACT EXTRACTION     both eyes  . COLONOSCOPY    . CYSTOSCOPY     FOR KIDNEY STONE  . LUMBAR LAMINECTOMY/DECOMPRESSION MICRODISCECTOMY    . TOTAL HIP ARTHROPLASTY Left 07/17/2014   Procedure: LEFT TOTAL HIP ARTHROPLASTY ANTERIOR APPROACH;  Surgeon: Shelda Pal, MD;  Location: [facility];  Service: Orthopedics;  Laterality: Left;     OB History   No obstetric history on file.     Family History  Problem Relation Age of Onset  . Cancer Mother        started in gallbladder then spread to liver  . Heart attack Father   . Colon cancer Neg Hx   . Esophageal cancer Neg Hx   . Rectal cancer Neg  Hx   . Stomach cancer Neg Hx     Social History   Tobacco Use  . Smoking status: Never Smoker  . Smokeless tobacco: Never Used  Vaping Use  . Vaping Use: Never used  Substance Use Topics  . Alcohol use: No    Alcohol/week: 0.0 standard drinks  . Drug use: No    Home Medications Prior to Admission medications   Medication Sig Start Date End Date Taking? Authorizing Provider  aspirin 81 MG tablet Take 81 mg by mouth daily.    [provider]  Calcium Carbonate-Vit D-Min (CALCIUM 1200 PO) Take 1 tablet by mouth 2 (two) times daily.     [provider]  doxazosin (CARDURA) 8 MG tablet Take 8 mg by mouth every morning.     [provider]  DOXYCYCLINE CALCIUM PO Take by mouth daily.    [provider]  IRON PO Take 1 tablet by mouth daily.     [provider]  isosorbide mononitrate (IMDUR) 60 MG 24 hr tablet Take 60 mg by mouth every morning.     [provider]  levothyroxine (SYNTHROID, LEVOTHROID) 25 MCG tablet Take by mouth daily. 05/22/15   [provider]  lidocaine (LIDODERM) 5 % Place 1 patch onto the skin daily. Remove & Discard patch within 12 hours or as directed by MD Patient not taking: Reported on 09/18/2019 10/21/17   Shaune Pollack, MD  LORazepam (ATIVAN) 0.5 MG tablet Take 0.25-0.5 mg by mouth daily as needed. 07/12/19   [provider]  meclizine (ANTIVERT) 12.5 MG tablet Take 1 tablet (12.5 mg total) by mouth 3 (three) times daily as needed for dizziness. Patient not taking: Reported on 09/18/2019 05/14/18   Charlynne Pander, MD  metoCLOPramide (REGLAN) 10 MG tablet Take 1 tablet (10 mg total) by mouth every 6 (six) hours as needed for nausea (nausea/headache). Patient not taking: Reported on 09/18/2019 05/14/18   Charlynne Pander, MD  Multiple Vitamin (MULTIVITAMIN) tablet Take 1 tablet by mouth every morning.     [provider]  sertraline (ZOLOFT) 100 MG tablet Take 200 mg by mouth every morning.     [provider]  simvastatin (ZOCOR) 80 MG tablet Take 80 mg by mouth every morning.     [provider]    Allergies    Nsaids  Review of Systems   Review of Systems  Constitutional: Negative for fever.  HENT: Negative for hearing loss, sore throat and tinnitus.   Eyes: Negative for visual disturbance.  Respiratory: Negative for shortness of breath.   Cardiovascular: Negative for chest pain, palpitations and syncope.  Gastrointestinal: Positive for nausea. Negative for abdominal pain, diarrhea and vomiting.  Genitourinary: Negative for dysuria.  Musculoskeletal: Negative for neck pain.  Skin: Negative for rash.  Neurological: Positive for dizziness and light-headedness. Negative for facial asymmetry, speech difficulty, weakness and numbness.      Physical Exam Updated Vital Signs BP 112/68 (BP Location: Right Arm)   Pulse 71   Temp 98.7 F (37.1 C) (Oral)   Resp 16   Ht 5\' 4"  (1.626 m)   Wt 55.3 kg   SpO2 97%   BMI 20.94 kg/m   Physical Exam Vitals and nursing note reviewed.  Constitutional:      General: She is not in acute distress.    Appearance: Normal appearance. She is well-developed.  HENT:     Head: Normocephalic and atraumatic.  Eyes:     Conjunctiva/sclera: Conjunctivae normal.  Cardiovascular:     Rate and Rhythm: Normal rate and regular rhythm.     Heart sounds: No murmur heard.   Pulmonary:     Effort: Pulmonary effort is normal. No respiratory distress.     Breath sounds: Normal breath sounds.  Abdominal:     Palpations: Abdomen is soft.     Tenderness: There is no abdominal tenderness.  Musculoskeletal:     Cervical back: Neck supple.  Skin:    General: Skin is warm and dry.     Capillary Refill: Capillary refill takes less than 2 seconds.  Neurological:     Mental Status: She is alert and oriented to person, place, and time.     GCS: GCS eye subscore is 4. GCS verbal subscore is 5. GCS motor subscore is 6.     Cranial Nerves: Cranial nerves are intact.     Sensory: Sensation is intact.     Motor: Motor function is intact.     Coordination: Finger-Nose-Finger Test and Heel to [employer] normal.     Comments: She has a few beats of horizontal nystagmus to the left     ED Results / Procedures / Treatments   Labs (all labs ordered are listed, but only abnormal results are displayed) Labs Reviewed  BASIC METABOLIC PANEL - Abnormal; Notable for the following components:      Result Value   Glucose, Bld 105 (*)    All other components within normal limits  CBC - Abnormal; Notable for the following components:   Hemoglobin 11.9 (*)    All other components within normal limits  URINALYSIS, ROUTINE W REFLEX MICROSCOPIC - Abnormal; Notable for the following components:   APPearance HAZY  (*)    Specific Gravity, Urine >1.030 (*)    All other components within normal limits    EKG EKG Interpretation  Date/Time:  Sunday July 21 2020 16:08:32 EDT Ventricular Rate:  69 PR Interval:  154 QRS Duration: 76 QT Interval:  390 QTC Calculation: 417 R Axis:   -47 Text Interpretation: Normal sinus rhythm Left anterior fascicular block Abnormal ECG No significant change since prior 8/19 Confirmed by Meridee Score [phone]) on 07/21/2020 4:17:51 PM   Radiology CT Angio Head W/Cm &/Or Wo Cm  Result Date: 07/21/2020 CLINICAL DATA:  Dizziness.  Headache, blurry vision, nausea. EXAM: CT ANGIOGRAPHY HEAD AND NECK TECHNIQUE: Multidetector CT imaging of the head and neck was performed using the standard protocol during bolus administration of intravenous contrast. Multiplanar CT image reconstructions and MIPs were obtained to evaluate the vascular anatomy. Carotid stenosis measurements (when applicable) are obtained utilizing NASCET criteria, using the distal internal carotid diameter as the denominator. CONTRAST:  OMNIPAQUE IOHEXOL 350 MG/ML SOLN COMPARISON:  CTA 05/14/2018. FINDINGS: CT HEAD FINDINGS Brain: No evidence of acute large vascular territory infarction, hemorrhage, hydrocephalus, extra-axial collection or mass lesion/mass effect. Mild for age patchy white matter hypoattenuation, compatible with chronic microvascular ischemic disease. Vascular: Calcific atherosclerosis. Skull: No acute fracture. Sinuses: Mild mucosal thickening the left frontal sinus. Otherwise, visualized sinuses are largely clear. Orbits: No acute finding. Review of the MIP images confirms the above findings CTA NECK FINDINGS Aortic arch: Imaged portion shows no evidence of aneurysm or dissection. No significant stenosis of the major arch vessel origins. Atherosclerosis of the aorta. Right carotid system: No evidence of dissection, stenosis (50% or greater) or occlusion. Mild atherosclerosis at the  bifurcation. Similar tortuosity of the internal carotid artery with kinking at the skull base. Left carotid system:  No evidence of dissection, stenosis (50% or greater) or occlusion. Mild atherosclerosis at the bifurcation. Tortuosity of the proximal left common carotid artery and left internal carotid artery. Vertebral arteries: Left dominant. No evidence of dissection, stenosis (50% or greater) or occlusion. Tortuous proximal left vertebral artery. Skeleton: Moderate degenerative disc disease at C6-C7. Other neck: No mass or adenopathy. Upper chest: No acute findings. Review of the MIP images confirms the above findings CTA HEAD FINDINGS Anterior circulation: No significant stenosis or proximal occlusion. Similar size of a small (approximately 2 mm) aneurysm at the right MCA bifurcation (see series 11, image 216). Posterior circulation: No significant stenosis, proximal occlusion, aneurysm, or vascular malformation. Fetal type bilateral PCAs. Tortuous posterior communicating arteries Venous sinuses: As permitted by contrast timing, patent. Review of the MIP images confirms the above findings IMPRESSION: 1. No evidence of acute intracranial abnormality. 2. Similar appearance of the vasculature in comparison to CTA from 05/14/2018. No hemodynamically significant stenosis or large vessel occlusion in the head or neck. 3. 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 Electronically Signed   By: Feliberto HartsFrederick S Jones MD   On: 07/21/2020 18:47   CT Angio Neck W and/or Wo Contrast  Result Date: 07/21/2020 CLINICAL DATA:  Dizziness.  Headache, blurry vision, nausea. EXAM: CT ANGIOGRAPHY HEAD AND NECK TECHNIQUE: Multidetector CT imaging of the head and neck was performed using the standard protocol during bolus administration of intravenous contrast. Multiplanar CT image reconstructions and MIPs were obtained to evaluate the vascular anatomy. Carotid stenosis measurements (when applicable) are obtained  utilizing NASCET criteria, using the distal internal carotid diameter as the denominator. CONTRAST:  100mL OMNIPAQUE IOHEXOL 350 MG/ML SOLN COMPARISON:  CTA 05/14/2018. FINDINGS: CT HEAD FINDINGS Brain: No evidence of acute large vascular territory infarction, hemorrhage, hydrocephalus, extra-axial collection or mass lesion/mass effect. Mild for age patchy white matter hypoattenuation, compatible with chronic microvascular ischemic disease. Vascular: Calcific atherosclerosis. Skull: No acute fracture. Sinuses: Mild mucosal thickening the left frontal sinus. Otherwise, visualized sinuses are largely clear. Orbits: No acute finding. Review of the MIP images confirms the above findings CTA NECK FINDINGS Aortic arch: Imaged portion shows no evidence of aneurysm or dissection. No significant stenosis of the major arch vessel origins. Atherosclerosis of the aorta. Right carotid system: No evidence of dissection, stenosis (50% or greater) or occlusion. Mild atherosclerosis at the bifurcation. Similar tortuosity of the internal carotid artery with kinking at the skull base. Left carotid system: No evidence of dissection, stenosis (50% or greater) or occlusion. Mild atherosclerosis at the bifurcation. Tortuosity of the proximal left common carotid artery and left internal carotid artery. Vertebral arteries: Left dominant. No evidence of dissection, stenosis (50% or greater) or occlusion. Tortuous proximal left vertebral artery. Skeleton: Moderate degenerative disc disease at C6-C7. Other neck: No mass or adenopathy. Upper chest: No acute findings. Review of the MIP images confirms the above findings CTA HEAD FINDINGS Anterior circulation: No significant stenosis or proximal occlusion. Similar size of a small (approximately 2 mm) aneurysm at the right MCA bifurcation (see series 11, image 216). Posterior circulation: No significant stenosis, proximal occlusion, aneurysm, or vascular malformation. Fetal type bilateral PCAs.  Tortuous posterior communicating arteries Venous sinuses: As permitted by contrast timing, patent. Review of the MIP images confirms the above findings IMPRESSION: 1. No evidence of acute intracranial abnormality. 2. Similar appearance of the vasculature in comparison to CTA from 05/14/2018. No hemodynamically significant stenosis or large vessel occlusion in the head or neck. 3. No substantial change  in a 2 mm right MCA bifurcation aneurysm. Electronically Signed   By: Feliberto Harts MD   On: 07/21/2020 18:47    Procedures Procedures (including critical care time)  Medications Ordered in ED Medications  sodium chloride 0.9 % bolus 1,000 mL (0 mLs Intravenous Stopped 07/21/20 1933)  iohexol (OMNIPAQUE) 350 MG/ML injection 100 mL (100 mLs Intravenous Contrast Given 07/21/20 1811)    ED Course  I have reviewed the triage vital signs and the nursing notes.  Pertinent labs & imaging results that were available during my care of the patient were reviewed by me and considered in my medical decision making (see chart for details).  Clinical Course as of Jul 22 1021  Wynelle Link Jul 21, 2020  9924 Patient's received her fluid bolus.  She said she feels somewhat better.  Reviewed her work-up with her including her CT imaging.  We are checking orthostatics and she wants to ambulate to the bathroom.   [MB]  2028 Patient ambulated in the department and she said her dizziness was much improved.  She is asking to be discharged.  She has a PCP to follow-up with.  Return instructions discussed.   [MB]    Clinical Course User Index [MB] Terrilee Files, MD   MDM Rules/Calculators/A&P                         This patient complains of dizziness unsteadiness; this involves an extensive number of treatment Options and is a complaint that carries with it a high risk of complications and Morbidity. The differential includes dehydration/hypovolemia, vertigo, arrhythmia, stroke, anemia, metabolic derangement,  infection  I ordered, reviewed and interpreted labs, which included CBC with normal white count, stable hemoglobin, chemistries fairly normal, urinalysis concentrated but without obvious sign of infection I ordered medication IV fluids I ordered imaging studies which included CTA head and neck and I independently    visualized and interpreted imaging which showed no acute findings Additional history obtained from patient's daughter Previous records obtained and reviewed in epic, no recent admissions  After the interventions stated above, I reevaluated the patient and found patient to be symptomatically improved.  She is ambulated in the department.  Patient and daughter are comfortable with discharge and close follow-up with PCP.  Return instructions discussed.  Final Clinical Impression(s) / ED Diagnoses Final diagnoses:  Dizziness    Rx / DC Orders ED Discharge Orders    None       Terrilee Files, MD 07/22/20 1024

## 2020-07-21 NOTE — ED Notes (Signed)
Pt ambulated to bathroom, min assist; pt tolerated well

## 2020-07-21 NOTE — ED Notes (Signed)
Taken to CT.

## 2020-07-21 NOTE — ED Notes (Signed)
ED Provider at bedside. 

## 2020-07-21 NOTE — ED Triage Notes (Signed)
Woke up with dizziness today. Also states she has had a head cold. Denies chest pain.

## 2020-07-21 NOTE — Discharge Instructions (Addendum)
You were seen in the emergency department for evaluation of dizziness and feeling unsteady on your feet.  You had blood work urinalysis and a CAT scan of your head and neck that did not show any serious findings.  This is likely vertigo but if your symptoms worsen you may need further work-up.  Follow-up with your doctor.  Return to the emergency department if any worsening or concerning symptoms.

## 2020-09-17 ENCOUNTER — Ambulatory Visit (INDEPENDENT_AMBULATORY_CARE_PROVIDER_SITE_OTHER): Payer: Medicare HMO | Admitting: Neurology

## 2020-09-17 ENCOUNTER — Encounter: Payer: Self-pay | Admitting: Neurology

## 2020-09-17 VITALS — BP 129/74 | HR 71 | Ht 63.0 in | Wt 133.0 lb

## 2020-09-17 DIAGNOSIS — R413 Other amnesia: Secondary | ICD-10-CM | POA: Diagnosis not present

## 2020-09-17 DIAGNOSIS — M21372 Foot drop, left foot: Secondary | ICD-10-CM

## 2020-09-17 DIAGNOSIS — I679 Cerebrovascular disease, unspecified: Secondary | ICD-10-CM

## 2020-09-17 NOTE — Progress Notes (Signed)
HISTORY OF PRESENT ILLNESS: [name] is a 79 year old female, seen in request by her primary care physician Dr. Andrey CampanileWilson, Merlyn AlbertFred for evaluation of sudden onset dizziness, gait abnormality, she is accompanied by her daughter [name], granddaughter [name] are at today's clinical visit.  Initial evaluation was on May 18, 2018.  She had a past medical history of hyperlipidemia, hypertension, depression, her husband passed away in December 2017, she now lives with her granddaughter, she had a history of left lumbar radiculopathy, status decompression surgery in the past, with residual left foot drop, gait abnormality,  On May 11, 2018, she woke up early morning around 6 AM using bathroom, noticed unsteady gait, vertigo, room was spinning, nauseous, she was able to manage to use the bathroom, lying back to sleep again, few hours later, when she tried to get up again, symptoms still present, but had mild improvement, she has been symptomatic since his onset, few days later, when she went out shopping with her sister on May 14, 2018, she suddenly felt nauseous, dizziness, has to sit out waiting for her sister, ambulance was called, she was evaluated at emergency room,  I personally reviewed CTA head and neck on May 14, 2018, 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  Echocardiogram on June 22, 2018 showed ejection fraction 55 to 60%, wall motion was normal,  Laboratory evaluation August 2019, negative troponin, CMP  showed elevated glucose 117, showed hemoglobin of 11.7,  She continues to have left foot drop, unsteady gait  UPDATE Sep 17 2020: She is accompanied by her daughter at today's clinical visit, continue to have left foot drop, mild unsteady gait, wearing a left ankle brace, remain active at church, going out with her friends regularly, still driving, but daughter noticed that she has slow worsening memory loss,  Again personally reviewed CT scan without contrast in October 2019, no acute abnormality, mild supratentorium small vessel disease.  REVIEW OF SYSTEMS: Out of a complete 14 system review of symptoms, the patient complains only of the following symptoms, and all other reviewed systems are negative.  Gait abnormality   ALLERGIES: Allergies  Allergen Reactions  . Nsaids Other (See Comments)    GI upset    HOME MEDICATIONS: Outpatient Medications Prior to Visit  Medication Sig Dispense Refill  . aspirin 81 MG tablet Take 81 mg by mouth daily.    . Calcium Carbonate-Vit D-Min (CALCIUM 1200 PO) Take 1 tablet by mouth 2 (two) times daily.    Marland Kitchen. doxazosin (CARDURA) 8 MG tablet Take 8 mg by mouth every morning.    Marland Kitchen. DOXYCYCLINE CALCIUM PO Take by mouth daily.    . IRON PO Take 1 tablet by mouth daily.    . isosorbide mononitrate (IMDUR) 60 MG 24 hr tablet Take 60 mg by mouth every morning.    Marland Kitchen. levothyroxine (SYNTHROID, LEVOTHROID) 25 MCG tablet Take by mouth daily.    Marland Kitchen. lidocaine (LIDODERM) 5 % Place 1 patch onto the skin daily. Remove & Discard patch within 12 hours or as directed by MD 30 patch 0  .  LORazepam (ATIVAN) 0.5 MG tablet Take 0.25-0.5 mg by mouth daily as needed.    . meclizine (ANTIVERT) 12.5 MG tablet Take 1 tablet (12.5 mg total) by mouth 3 (three) times daily as needed for dizziness. 30 tablet 0  . metoCLOPramide (REGLAN) 10 MG tablet Take 1 tablet (10 mg total) by mouth every 6 (six) hours as needed for nausea (nausea/headache). 10 tablet 0  . Multiple Vitamin  (MULTIVITAMIN) tablet Take 1 tablet by mouth every morning.    . sertraline (ZOLOFT) 100 MG tablet Take 200 mg by mouth every morning.    . simvastatin (ZOCOR) 80 MG tablet Take 80 mg by mouth every morning.     No facility-administered medications prior to visit.    PAST MEDICAL HISTORY: Past Medical History:  Diagnosis Date  . Arthritis   . Chest pain   . Chronic back pain    "neurostimulator electrode leads overlie the lower thoracic spinal canal" - per cxr report 07/10/14  . Chronic kidney disease    kidney stone  . Coronary artery disease    NONOBSTRUCTIVE  . Depression   . Dizziness   . GERD (gastroesophageal reflux disease)   . Headache   . History of kidney stones   . Hyperlipidemia   . Hypertension   . Left foot drop    SINCE 2009 - RELATED TO BACK PROBLEM - WEARS BRACE  . PONV (postoperative nausea and vomiting)   . Spinal stenosis   . Thyroid disease    hypothryoid    PAST SURGICAL HISTORY: Past Surgical History:  Procedure Laterality Date  . BACK SURGERY    . CARDIAC CATHETERIZATION  02/22/2009   EF 60%  . CATARACT EXTRACTION     both eyes  . COLONOSCOPY    . CYSTOSCOPY     FOR KIDNEY STONE  . LUMBAR LAMINECTOMY/DECOMPRESSION MICRODISCECTOMY    . TOTAL HIP ARTHROPLASTY Left 07/17/2014   Procedure: LEFT TOTAL HIP ARTHROPLASTY ANTERIOR APPROACH;  Surgeon: Shelda Pal, MD;  Location: [facility];  Service: Orthopedics;  Laterality: Left;    FAMILY HISTORY: Family History  Problem Relation Age of Onset  . Cancer Mother        started in gallbladder then spread to liver  . Heart attack Father   . Colon cancer Neg Hx   . Esophageal cancer Neg Hx   . Rectal cancer Neg Hx   . Stomach cancer Neg Hx     SOCIAL HISTORY: Social History   Socioeconomic History  . Marital status: Widowed    Spouse name: Not on file  . Number of children: 2  . Years of education: 74  . Highest education level: High school graduate  Occupational History  . Occupation:  Retired  Tobacco Use  . Smoking status: Never Smoker  . Smokeless tobacco: Never Used  Vaping Use  . Vaping Use: Never used  Substance and Sexual Activity  . Alcohol use: No    Alcohol/week: 0.0 standard drinks  . Drug use: No  . Sexual activity: Not on file  Other Topics Concern  . Not on file  Social History Narrative   Lives with her granddaughter.   Left-handed.   No caffeine use.   Social Determinants of Health   Financial Resource Strain: Not on file  Food Insecurity: Not on file  Transportation Needs: Not on file  Physical Activity: Not on file  Stress: Not on file  Social Connections: Not on file  Intimate Partner Violence: Not on file  PHYSICAL EXAM  Vitals:   09/17/20 1110  BP: 129/74  Pulse: 71  Weight: 133 lb (60.3 kg)  Height: 5\' 3"  (1.6 m)   Body mass index is 23.56 kg/m.   PHYSICAL EXAMNIATION:  Gen: NAD, conversant, well nourised, well groomed                     Cardiovascular: Regular rate rhythm, no peripheral edema, warm, nontender. Eyes: Conjunctivae clear without exudates or hemorrhage Neck: Supple, no carotid bruits. Pulmonary: Clear to auscultation bilaterally   NEUROLOGICAL EXAM:  MENTAL STATUS: Speech/Cognition: MMSE - Mini Mental State Exam 09/17/2020  Orientation to time 5  Orientation to Place 5  Registration 3  Attention/ Calculation 3  Recall 2  Language- name 2 objects 2  Language- repeat 0  Language- follow 3 step command 3  Language- read & follow direction 1  Write a sentence 0  Copy design 0  Total score 24    CRANIAL NERVES: CN II: Visual fields are full to confrontation.  Pupils are round equal and briskly reactive to light. CN III, IV, VI: extraocular movement are normal. No ptosis. CN V: Facial sensation is intact to light touch. CN VII: Face is symmetric with normal eye closure and smile. CN VIII: Hearing is normal to casual conversation CN IX, X: Palate elevates symmetrically. Phonation is normal. CN  XI: Head turning and shoulder shrug are intact CN XII: Tongue is midline with normal movements and no atrophy.  MOTOR: Muscle bulk and tone are normal. Muscle strength is normal.  REFLEXES: Reflexes are 2  and symmetric at the biceps, triceps, knees and ankles. Plantar responses are flexor.  SENSORY: Intact to light touch, pinprick, positional and vibratory sensation at fingers and toes.  COORDINATION: There is no trunk or limb ataxia.    GAIT/STANCE: She needs push-up to get up from seated position, dragging left leg, mildly unsteady   DIAGNOSTIC DATA (LABS, IMAGING, TESTING) - I reviewed patient records, labs, notes, testing and imaging myself where available.  Lab Results  Component Value Date   WBC 5.4 07/21/2020   HGB 11.9 (L) 07/21/2020   HCT 36.8 07/21/2020   MCV 92.9 07/21/2020   PLT 182 07/21/2020      Component Value Date/Time   NA 138 07/21/2020 1622   K 4.0 07/21/2020 1622   CL 102 07/21/2020 1622   CO2 26 07/21/2020 1622   GLUCOSE 105 (H) 07/21/2020 1622   BUN 19 07/21/2020 1622   CREATININE 0.67 07/21/2020 1622   CALCIUM 9.7 07/21/2020 1622   PROT 6.7 05/14/2018 1624   ALBUMIN 4.0 05/14/2018 1624   AST 25 05/14/2018 1624   ALT 15 05/14/2018 1624   ALKPHOS 98 05/14/2018 1624   BILITOT 0.4 05/14/2018 1624   GFRNONAA >60 07/21/2020 1622   GFRAA >60 05/14/2018 1624   No results found for: CHOL, HDL, LDLCALC, LDLDIRECT, TRIG, CHOLHDL Lab Results  Component Value Date   HGBA1C  02/21/2009    5.7 (NOTE) The ADA recommends the following therapeutic goal for glycemic control related to Hgb A1c measurement: Goal of therapy: <6.5 Hgb A1c  Reference: American Diabetes Association: Clinical Practice Recommendations 2010, Diabetes Care, 2010, 33: (Suppl  1).     ASSESSMENT AND PLAN 79 y.o. 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 Ph.D.  [facility] [address] [place], [place] [address] Phone: [phone] Fax:      [phone]

## 2020-09-18 ENCOUNTER — Telehealth: Payer: Self-pay | Admitting: *Deleted

## 2020-09-18 LAB — TSH: TSH: 2.83 u[IU]/mL (ref 0.450–4.500)

## 2020-09-18 LAB — RPR: RPR Ser Ql: NONREACTIVE

## 2020-09-18 LAB — VITAMIN B12: Vitamin B-12: 333 pg/mL (ref 232–1245)

## 2020-09-18 NOTE — Telephone Encounter (Signed)
I spoke to patient and provided her with the lab results. 

## 2020-09-18 NOTE — Telephone Encounter (Signed)
-----   Message from Levert Feinstein, MD sent at 09/18/2020  9:26 AM EST ----- Please call patient for normal laboratory result

## 2020-11-15 ENCOUNTER — Emergency Department (HOSPITAL_BASED_OUTPATIENT_CLINIC_OR_DEPARTMENT_OTHER): Payer: Medicare HMO

## 2020-11-15 ENCOUNTER — Emergency Department (HOSPITAL_BASED_OUTPATIENT_CLINIC_OR_DEPARTMENT_OTHER)
Admission: EM | Admit: 2020-11-15 | Discharge: 2020-11-15 | Disposition: A | Discharge status: Home / Self Care | Payer: Medicare HMO | Attending: Emergency Medicine | Admitting: Emergency Medicine

## 2020-11-15 ENCOUNTER — Encounter (HOSPITAL_BASED_OUTPATIENT_CLINIC_OR_DEPARTMENT_OTHER): Payer: Self-pay | Admitting: Emergency Medicine

## 2020-11-15 ENCOUNTER — Other Ambulatory Visit: Payer: Self-pay

## 2020-11-15 DIAGNOSIS — Z96642 Presence of left artificial hip joint: Secondary | ICD-10-CM | POA: Insufficient documentation

## 2020-11-15 DIAGNOSIS — I2510 Atherosclerotic heart disease of native coronary artery without angina pectoris: Secondary | ICD-10-CM | POA: Insufficient documentation

## 2020-11-15 DIAGNOSIS — S2232XA Fracture of one rib, left side, initial encounter for closed fracture: Secondary | ICD-10-CM | POA: Diagnosis not present

## 2020-11-15 DIAGNOSIS — S299XXA Unspecified injury of thorax, initial encounter: Secondary | ICD-10-CM | POA: Diagnosis present

## 2020-11-15 DIAGNOSIS — N189 Chronic kidney disease, unspecified: Secondary | ICD-10-CM | POA: Diagnosis not present

## 2020-11-15 DIAGNOSIS — I129 Hypertensive chronic kidney disease with stage 1 through stage 4 chronic kidney disease, or unspecified chronic kidney disease: Secondary | ICD-10-CM | POA: Insufficient documentation

## 2020-11-15 DIAGNOSIS — W19XXXA Unspecified fall, initial encounter: Secondary | ICD-10-CM | POA: Diagnosis not present

## 2020-11-15 DIAGNOSIS — S1081XA Abrasion of other specified part of neck, initial encounter: Secondary | ICD-10-CM | POA: Insufficient documentation

## 2020-11-15 DIAGNOSIS — Z7982 Long term (current) use of aspirin: Secondary | ICD-10-CM | POA: Diagnosis not present

## 2020-11-15 DIAGNOSIS — M25512 Pain in left shoulder: Secondary | ICD-10-CM | POA: Diagnosis not present

## 2020-11-15 DIAGNOSIS — E039 Hypothyroidism, unspecified: Secondary | ICD-10-CM | POA: Diagnosis not present

## 2020-11-15 DIAGNOSIS — R42 Dizziness and giddiness: Secondary | ICD-10-CM | POA: Insufficient documentation

## 2020-11-15 DIAGNOSIS — Z79899 Other long term (current) drug therapy: Secondary | ICD-10-CM | POA: Diagnosis not present

## 2020-11-15 LAB — URINALYSIS, ROUTINE W REFLEX MICROSCOPIC
Bilirubin Urine: NEGATIVE
Glucose, UA: NEGATIVE mg/dL
Hgb urine dipstick: NEGATIVE
Ketones, ur: NEGATIVE mg/dL
Nitrite: NEGATIVE
Protein, ur: NEGATIVE mg/dL
Specific Gravity, Urine: 1.01 (ref 1.005–1.030)
pH: 7 (ref 5.0–8.0)

## 2020-11-15 LAB — COMPREHENSIVE METABOLIC PANEL
ALT: 12 U/L (ref 0–44)
AST: 18 U/L (ref 15–41)
Albumin: 3.9 g/dL (ref 3.5–5.0)
Alkaline Phosphatase: 100 U/L (ref 38–126)
Anion gap: 7 (ref 5–15)
BUN: 15 mg/dL (ref 8–23)
CO2: 27 mmol/L (ref 22–32)
Calcium: 8.7 mg/dL — ABNORMAL LOW (ref 8.9–10.3)
Chloride: 104 mmol/L (ref 98–111)
Creatinine, Ser: 0.69 mg/dL (ref 0.44–1.00)
GFR, Estimated: >60 mL/min (ref >60)
Glucose, Bld: 92 mg/dL (ref 70–99)
Potassium: 4 mmol/L (ref 3.5–5.1)
Sodium: 138 mmol/L (ref 135–145)
Total Bilirubin: 0.2 mg/dL — ABNORMAL LOW (ref 0.3–1.2)
Total Protein: 6.6 g/dL (ref 6.5–8.1)

## 2020-11-15 LAB — CBC WITH DIFFERENTIAL/PLATELET
Abs Immature Granulocytes: 0.02 10*3/uL (ref 0.00–0.07)
Basophils Absolute: 0 10*3/uL (ref 0.0–0.1)
Basophils Relative: 0 %
Eosinophils Absolute: 0.2 10*3/uL (ref 0.0–0.5)
Eosinophils Relative: 3 %
HCT: 33.9 % — ABNORMAL LOW (ref 36.0–46.0)
Hemoglobin: 11.1 g/dL — ABNORMAL LOW (ref 12.0–15.0)
Immature Granulocytes: 0 %
Lymphocytes Relative: 26 %
Lymphs Abs: 1.4 10*3/uL (ref 0.7–4.0)
MCH: 30 pg (ref 26.0–34.0)
MCHC: 32.7 g/dL (ref 30.0–36.0)
MCV: 91.6 fL (ref 80.0–100.0)
Monocytes Absolute: 0.4 10*3/uL (ref 0.1–1.0)
Monocytes Relative: 8 %
Neutro Abs: 3.4 10*3/uL (ref 1.7–7.7)
Neutrophils Relative %: 63 %
Platelets: 173 10*3/uL (ref 150–400)
RBC: 3.7 MIL/uL — ABNORMAL LOW (ref 3.87–5.11)
RDW: 13.1 % (ref 11.5–15.5)
WBC: 5.4 10*3/uL (ref 4.0–10.5)
nRBC: 0 % (ref 0.0–0.2)

## 2020-11-15 LAB — URINALYSIS, MICROSCOPIC (REFLEX): RBC / HPF: NONE SEEN RBC/hpf (ref 0–5)

## 2020-11-15 MED ORDER — TRAMADOL HCL 50 MG PO TABS
50.0000 mg | ORAL_TABLET | Freq: Four times a day (QID) | ORAL | 0 refills | Status: DC | PRN
Start: 2020-11-15 — End: 2021-01-18

## 2020-11-15 MED ORDER — HYDROCODONE-ACETAMINOPHEN 5-325 MG PO TABS
1.0000 | ORAL_TABLET | ORAL | 0 refills | Status: DC | PRN
Start: 2020-11-15 — End: 2020-11-15

## 2020-11-15 MED ORDER — HYDROCODONE-ACETAMINOPHEN 5-325 MG PO TABS
1.0000 | ORAL_TABLET | Freq: Once | ORAL | Status: AC
  Administered 2020-11-15: 1 via ORAL
  Filled 2020-11-15: qty 1

## 2020-11-15 NOTE — ED Notes (Signed)
Patient given incentive spirometer per MD order. Pt educated on incentive spirometer and demonstrated teach back.

## 2020-11-15 NOTE — ED Notes (Signed)
Ambulated patient with walker. Pt ambulated with steady gait. NAD. Pt reports soreness on left side.

## 2020-11-15 NOTE — ED Notes (Addendum)
Assumed care of this patient. Patient reports soreness on the left side of body. Vitals taken. NAD. A&Ox4. Respirations regular/unlabored. Family at bedside. Stretcher low, wheels locked, call bell within reach. Connected to cardiac monitor, BP, and Pulse Ox. No further concerns at this time.

## 2020-11-15 NOTE — ED Notes (Signed)
Pt has had multiple falls lately. Pt states she has had shoulder pain x approx 2 weeks after a fall. She fell again last night and has an abrasion to the right side of her face and swelling and redness to ring finger of right hand. Pt alert & oriented, nad noted.

## 2020-11-15 NOTE — ED Triage Notes (Signed)
Pt c/o left arm and shoulder pain x 3 days. Pt states that the pain is radiating into the chest. Pt is unsure of any injury to the arm/shoulder. Pt also states that she has been falling more frequently. Pt did fall last night and has abrasion to right side of cheek and neck. Denies any pain from fall last night. Pt aaox3, VSS, GCS 15, pt need assistance upon standing and walking in triage.

## 2020-11-15 NOTE — Discharge Instructions (Addendum)
Follow-up with your primary care doctor for recheck.  Return here as needed if you have any worsening symptoms. 

## 2020-11-15 NOTE — ED Provider Notes (Signed)
[facility] EMERGENCY DEPARTMENT Provider Note   CSN: [id number] Arrival date & time: 11/15/20  1627     History Chief Complaint  Patient presents with  . Chest Pain  . Shoulder Pain    [name] is a 80 y.o. female.  Patient is a 80 year old female who presents with left shoulder and chest wall pain.  She is here with her family member.  She has history of ongoing vertigo for the last several months.  She says that she has had several falls related to this.  Her family feels like she is falling more than she normally does.  She had a fall within the last week where she thinks she fell because of the dizziness but she landed on her left side.  She has had some pain in her left shoulder and her left chest since that time.  It hurts to move and hurts to cough.  She does not report any other type chest pain.  No shortness of breath.  She also fell yesterday where she tripped on a dog toy and has an abrasion to her right face.  She reportedly has vertigo frequently and takes meclizine daily.  She does not report any change to the vertigo recently.  She has not had any recent illnesses.  No cough or cold symptoms.  No fevers.  No urinary symptoms.  No vomiting or abdominal pain.  She has had some loose stools for about the last 3 days.  She denies any other complaints of pain from the falls.  She has been having some mild intermittent headaches.  She denies any numbness or weakness to her extremities.  No vision changes.  No speech deficits.        Past Medical History:  Diagnosis Date  . Arthritis   . Chest pain   . Chronic back pain    "neurostimulator electrode leads overlie the lower thoracic spinal canal" - per cxr report 07/10/14  . Chronic kidney disease    kidney stone  . Coronary artery disease    NONOBSTRUCTIVE  . Depression   . Dizziness   . GERD (gastroesophageal reflux disease)   . Headache   . History of kidney stones   . Hyperlipidemia   .  Hypertension   . Left foot drop    SINCE 2009 - RELATED TO BACK PROBLEM - WEARS BRACE  . PONV (postoperative nausea and vomiting)   . Spinal stenosis   . Thyroid disease    hypothryoid    Patient Active Problem List   Diagnosis Date Noted  . Memory loss 09/17/2020  . 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 Left foot drop 05/18/2018  . TIA (transient ischemic attack) 05/18/2018  . Gait abnormality 05/18/2018  . 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 Hypertension   . Hyperlipidemia   . Coronary artery disease   . Spinal stenosis     Past Surgical History:  Procedure Laterality Date  . BACK SURGERY    . CARDIAC CATHETERIZATION  02/22/2009   EF 60%  . CATARACT EXTRACTION     both eyes  . COLONOSCOPY    . CYSTOSCOPY     FOR KIDNEY STONE  . LUMBAR LAMINECTOMY/DECOMPRESSION MICRODISCECTOMY    . TOTAL HIP ARTHROPLASTY Left 07/17/2014   Procedure: LEFT TOTAL HIP ARTHROPLASTY ANTERIOR APPROACH;  Surgeon: Shelda PalMatthew D Olin, MD;  Location: [facility];  Service: Orthopedics;  Laterality: Left;     OB History   No obstetric history on  file.     Family History  Problem Relation Age of Onset  . Cancer Mother        started in gallbladder then spread to liver  . Heart attack Father   . Colon cancer Neg Hx   . Esophageal cancer Neg Hx   . Rectal cancer Neg Hx   . Stomach cancer Neg Hx     Social History   Tobacco Use  . Smoking status: Never Smoker  . Smokeless tobacco: Never Used  Vaping Use  . Vaping Use: Never used  Substance Use Topics  . Alcohol use: No    Alcohol/week: 0.0 standard drinks  . Drug use: No    Home Medications Prior to Admission medications   Medication Sig Start Date End Date Taking? Authorizing Provider  HYDROcodone-acetaminophen (NORCO/VICODIN) 5-325 MG tablet Take 1 tablet by mouth every 4 (four) hours as needed. 11/15/20  Yes Rolan Bucco, MD  aspirin 81 MG tablet Take 81 mg by mouth daily.    [provider]  Calcium Carbonate-Vit  D-Min (CALCIUM 1200 PO) Take 1 tablet by mouth 2 (two) times daily.    [provider]  doxazosin (CARDURA) 8 MG tablet Take 8 mg by mouth every morning.    [provider]  DOXYCYCLINE CALCIUM PO Take by mouth daily.    [provider]  IRON PO Take 1 tablet by mouth daily.    [provider]  isosorbide mononitrate (IMDUR) 60 MG 24 hr tablet Take 60 mg by mouth every morning.    [provider]  levothyroxine (SYNTHROID, LEVOTHROID) 25 MCG tablet Take by mouth daily. 05/22/15   [provider]  lidocaine (LIDODERM) 5 % Place 1 patch onto the skin daily. Remove & Discard patch within 12 hours or as directed by MD 10/21/17   Shaune Pollack, MD  LORazepam (ATIVAN) 0.5 MG tablet Take 0.25-0.5 mg by mouth daily as needed. 07/12/19   [provider]  meclizine (ANTIVERT) 12.5 MG tablet Take 1 tablet (12.5 mg total) by mouth 3 (three) times daily as needed for dizziness. 05/14/18   Charlynne Pander, MD  metoCLOPramide (REGLAN) 10 MG tablet Take 1 tablet (10 mg total) by mouth every 6 (six) hours as needed for nausea (nausea/headache). 05/14/18   Charlynne Pander, MD  Multiple Vitamin (MULTIVITAMIN) tablet Take 1 tablet by mouth every morning.    [provider]  sertraline (ZOLOFT) 100 MG tablet Take 200 mg by mouth every morning.    [provider]  simvastatin (ZOCOR) 80 MG tablet Take 80 mg by mouth every morning.    [provider]    Allergies    Nsaids  Review of Systems   Review of Systems  Constitutional: Negative for chills, diaphoresis, fatigue and fever.  HENT: Negative for congestion, rhinorrhea and sneezing.   Eyes: Negative.   Respiratory: Negative for cough, chest tightness and shortness of breath.   Cardiovascular: Positive for chest pain. Negative for leg swelling.  Gastrointestinal: Positive for diarrhea. Negative for abdominal pain, blood in stool, nausea and vomiting.  Genitourinary:  Negative for difficulty urinating, flank pain, frequency and hematuria.  Musculoskeletal: Positive for arthralgias. Negative for back pain.  Skin: Negative for rash.  Neurological: Positive for dizziness and headaches. Negative for speech difficulty, weakness and numbness.    Physical Exam Updated Vital Signs BP (!) 159/80 (BP Location: Left Arm)   Pulse (!) 54   Temp 98 F (36.7 C) (Oral)   Resp 16  Ht 5\' 3"  (1.6 m)   Wt 60.3 kg   SpO2 94%   BMI 23.56 kg/m   Physical Exam Constitutional:      Appearance: She is well-developed and well-nourished.  HENT:     Head: Normocephalic and atraumatic.  Eyes:     Pupils: Pupils are equal, round, and reactive to light.     Comments: No nystagmus  Neck:     Comments: She has an abrasion to the upper right neck at the TMJ area. Cardiovascular:     Rate and Rhythm: Normal rate and regular rhythm.     Heart sounds: Normal heart sounds.  Pulmonary:     Effort: Pulmonary effort is normal. No respiratory distress.     Breath sounds: Normal breath sounds. No wheezing or rales.  Chest:     Chest wall: Tenderness (Positive tenderness over the left mid ribs at the lateral and anterior aspect, no crepitus or deformity) present.  Abdominal:     General: Bowel sounds are normal.     Palpations: Abdomen is soft.     Tenderness: There is no abdominal tenderness. There is no guarding or rebound.  Musculoskeletal:        General: No edema. Normal range of motion.     Cervical back: Normal range of motion and neck supple.     Comments: No pain on palpation or range of motion of the extremities, including the hips.  There is some ecchymosis over the right fourth finger but no underlying bony tenderness.  Lymphadenopathy:     Cervical: No cervical adenopathy.  Skin:    General: Skin is warm and dry.     Findings: No rash.  Neurological:     Mental Status: She is alert and oriented to person, place, and time.     Comments: Motor 5 out of 5 all  extremities, sensation grossly intact to light touch all extremities, cranial nerves II through XII grossly intact, no pronator drift, finger-nose intact  Psychiatric:        Mood and Affect: Mood and affect normal.     ED Results / Procedures / Treatments   Labs (all labs ordered are listed, but only abnormal results are displayed) Labs Reviewed  CBC WITH DIFFERENTIAL/PLATELET - Abnormal; Notable for the following components:      Result Value   RBC 3.70 (*)    Hemoglobin 11.1 (*)    HCT 33.9 (*)    All other components within normal limits  COMPREHENSIVE METABOLIC PANEL - Abnormal; Notable for the following components:   Calcium 8.7 (*)    Total Bilirubin 0.2 (*)    All other components within normal limits  URINALYSIS, ROUTINE W REFLEX MICROSCOPIC - Abnormal; Notable for the following components:   Leukocytes,Ua SMALL (*)    All other components within normal limits  URINALYSIS, MICROSCOPIC (REFLEX) - Abnormal; Notable for the following components:   Bacteria, UA RARE (*)    All other components within normal limits  URINE CULTURE    EKG EKG Interpretation  Date/Time:  Friday November 15 2020 16:35:17 EST Ventricular Rate:  68 PR Interval:  152 QRS Duration: 76 QT Interval:  432 QTC Calculation: 459 R Axis:   -40 Text Interpretation: Normal sinus rhythm Left axis deviation Nonspecific ST and T wave abnormality Abnormal ECG since last tracing no significant change Confirmed by [id number] [phone]) on 11/15/2020 4:45:43 PM   Radiology DG Ribs Unilateral W/Chest Left  Result Date: 11/15/2020 CLINICAL DATA:  Arm and  shoulder pain EXAM: LEFT RIBS AND CHEST - 3+ VIEW COMPARISON:  10/21/2017 FINDINGS: Single view chest demonstrates mild cardiomegaly. No focal opacity or pleural effusion. Hiatal hernia. Thoracic stimulator leads. Left rib series demonstrates probable acute left eighth anterolateral rib fracture. IMPRESSION: Probable acute left eighth anterolateral rib  fracture. Cardiomegaly. Negative for pneumothorax or pleural effusion. Electronically Signed   By: Jasmine Pang M.D.   On: 11/15/2020 17:46   CT Head Wo Contrast  Result Date: 11/15/2020 CLINICAL DATA:  80 year old female with head trauma. EXAM: CT HEAD WITHOUT CONTRAST TECHNIQUE: Contiguous axial images were obtained from the base of the skull through the vertex without intravenous contrast. COMPARISON:  Head CT dated 07/21/2020. FINDINGS: Brain: The ventricles and sulci appropriate size for patient's age. Mild periventricular and deep white matter chronic microvascular ischemic changes noted. There is no acute intracranial hemorrhage. No mass effect midline shift. No extra-axial fluid collection. Vascular: No hyperdense vessel or unexpected calcification. Skull: Normal. Negative for fracture or focal lesion. Sinuses/Orbits: Mild mucoperiosteal thickening of paranasal sinuses. No air-fluid level. The mastoid air cells are clear. Other: None IMPRESSION: 1. No acute intracranial pathology. 2. Mild chronic microvascular ischemic changes. Electronically Signed   By: Elgie Collard M.D.   On: 11/15/2020 17:33    Procedures Procedures   Medications Ordered in ED Medications  HYDROcodone-acetaminophen (NORCO/VICODIN) 5-325 MG per tablet 1 tablet (has no administration in time range)    ED Course  I have reviewed the triage vital signs and the nursing notes.  Pertinent labs & imaging results that were available during my care of the patient were reviewed by me and considered in my medical decision making (see chart for details).    MDM Rules/Calculators/A&P                          Patient is a 80 year old female who presents after a fall.  She has had 2 recent falls this week.  The first 1 was seemingly related to some ongoing vertigo type symptoms that she has had for several months.  She takes daily meclizine.  She does not report that her dizziness is any worse or different than it has been  over the last several months.  She was previously seen in the ED for the symptoms.  She is also followed up with her PCP.  She did have some left-sided chest pain resulting from the fall.  X-rays reveal a rib fracture with no underlying lung injury.  She had a head CT which shows no acute abnormalities.  She does not report any other injuries from the fall.  She had some labs which are nonconcerning.  They are similar to prior values.  Her urinalysis does not look overly convincing for infection.  It was sent for culture but given her lack of symptoms, I will not treat her with antibiotics at this point.  She is able to ambulate without ataxia or dizziness.  She does not have any focal neurologic deficits.  She was discharged home in good condition.  She was given incentive spirometer to use for her rib fracture.  She was also given a prescription for tramadol for pain.  She was encouraged to have follow-up with her primary care doctor for recheck and to further discuss her ongoing dizziness.  Return precautions were given. Final Clinical Impression(s) / ED Diagnoses Final diagnoses:  Vertigo  Fall, initial encounter  Closed fracture of one rib of left side, initial encounter  Rx / DC Orders ED Discharge Orders         Ordered    HYDROcodone-acetaminophen (NORCO/VICODIN) 5-325 MG tablet  Every 4 hours PRN        11/15/20 2125           Rolan Bucco, MD 11/15/20 2136

## 2020-11-15 NOTE — ED Notes (Signed)
Pt in CT.

## 2020-11-17 LAB — URINE CULTURE

## 2021-01-14 ENCOUNTER — Ambulatory Visit: Payer: Medicare HMO | Admitting: Neurology

## 2021-01-14 ENCOUNTER — Other Ambulatory Visit: Payer: Self-pay

## 2021-01-14 ENCOUNTER — Encounter: Payer: Self-pay | Admitting: Neurology

## 2021-01-14 VITALS — BP 153/79 | HR 59 | Ht 63.0 in | Wt 134.5 lb

## 2021-01-14 DIAGNOSIS — M21372 Foot drop, left foot: Secondary | ICD-10-CM | POA: Diagnosis not present

## 2021-01-14 DIAGNOSIS — F0391 Unspecified dementia with behavioral disturbance: Secondary | ICD-10-CM | POA: Diagnosis not present

## 2021-01-14 DIAGNOSIS — F03918 Unspecified dementia, unspecified severity, with other behavioral disturbance: Secondary | ICD-10-CM | POA: Insufficient documentation

## 2021-01-14 DIAGNOSIS — R269 Unspecified abnormalities of gait and mobility: Secondary | ICD-10-CM | POA: Diagnosis not present

## 2021-01-14 MED ORDER — ARIPIPRAZOLE 2 MG PO TABS: 2.0000 mg | ORAL_TABLET | Freq: Every day | ORAL | 3 refills | Status: DC

## 2021-01-14 NOTE — Progress Notes (Signed)
ASSESSMENT AND PLAN 80 y.o. 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  Laboratory evaluation showed no treatable etiology  She was given a trial of Aricept, did not help her, daughter reported seems to made her worse, has stopped taking it  Start Abilify 2 mg daily, call for progress report in 2 weeks, titrating to higher dose if needed  Acute onset of vertigo, unsteady gait in August 2019,  Possible brainstem/cerebellum TIA  CT head showed no acute abnormality, not MRI candidate due to spinal cord stimulator   Vascular risk factor of aging, hyperlipidemia, hypothyroidism, hypertension, coronary artery disease  Continue aspirin 81 mg daily  Echocardiogram showed ejection fraction 55 to 60%   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 Andrey CampanileWilson, Merlyn AlbertFred for evaluation of sudden onset dizziness, gait abnormality, she is accompanied by her daughter [name], granddaughter [name] are at today's clinical visit.  Initial evaluation was on May 18, 2018.  She had a past medical history of hyperlipidemia, hypertension, depression, her husband passed away in December 2017, she now lives with her granddaughter, she had a history of left lumbar radiculopathy, status decompression surgery in the past, with residual left foot drop, gait abnormality,  On May 11, 2018, she woke up early morning around 6 AM using bathroom, noticed unsteady gait, vertigo, room was spinning, nauseous, she was able to manage to use the bathroom, lying back to sleep again, few hours later, when she tried to get up again,  symptoms still present, but had mild improvement, she has been symptomatic since his onset, few days later, when she went out shopping with her sister on May 14, 2018, she suddenly felt nauseous, dizziness, has to sit out waiting for her sister, ambulance was called, she was evaluated at emergency room,  I personally reviewed CTA head and neck on May 14, 2018, 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  Echocardiogram on June 22, 2018 showed ejection fraction 55 to 60%, wall motion was normal,  Laboratory evaluation August 2019, negative troponin, CMP showed elevated glucose 117, showed hemoglobin of 11.7,  She continues to have left foot drop, unsteady gait  UPDATE Sep 17 2020: She is accompanied by her daughter at today's clinical visit, continue to have left foot drop, mild unsteady gait, wearing a left ankle brace, remain active at church, going out with her friends regularly, still driving, but daughter noticed that she has slow worsening memory loss,  Again personally reviewed CT scan without contrast in October 2019, no acute abnormality, mild supratentorium small vessel disease.  UPDATE January 14 2021: She is tearful during today's interview, slow worsening memory loss, significant frustration, daughter reported sometimes she becomes so combative, hitting  things, she became very frustrated, loses her medicine, lose her keys, blame her family members, which is a change from her baseline, she has always been a  capable sweet person  She fell steps, had acute CT head of the brain without contrast in February 2022, no acute abnormality, mild supratentorium small vessel disease  Laboratory evaluation in 2022 showed normal negative vitamin D, TSH, B12, lipid panel, CBC mild anemia hemoglobin of 11.4,, CMP, Falling, combative, hitting, loosing things, loosing her medication, keys, blame other come to house, sweet person, hard on her.  She was given a trial of Aricept in March 2022, did not help her, daughter reported seems to make things worse, now has stopped taking it  She was given Ativan 0.5 mg twice daily as needed for agitation, which seems to help,  REVIEW OF SYSTEMS: Out of a complete 14 system review of symptoms, the patient complains only of the following symptoms, and all other reviewed systems are negative.  Gait abnormality   ALLERGIES: Allergies  Allergen Reactions  . Nsaids Other (See Comments)    GI upset    HOME MEDICATIONS: Outpatient Medications Prior to Visit  Medication Sig Dispense Refill  . aspirin 81 MG tablet Take 81 mg by mouth daily.    . Calcium Carbonate-Vit D-Min (CALCIUM 1200 PO) Take 1 tablet by mouth 2 (two) times daily.    Marland Kitchen doxazosin (CARDURA) 8 MG tablet Take 8 mg by mouth every morning.    . IRON PO Take 1 tablet by mouth daily.    . isosorbide mononitrate (IMDUR) 60 MG 24 hr tablet Take 60 mg by mouth every morning.    Marland Kitchen levothyroxine (SYNTHROID, LEVOTHROID) 25 MCG tablet Take by mouth daily.    Marland Kitchen lidocaine (LIDODERM) 5 % Place 1 patch onto the skin daily. Remove & Discard patch within 12 hours or as directed by MD 30 patch 0  . LORazepam (ATIVAN) 0.5 MG tablet Take 0.25-0.5 mg by mouth daily as needed.    . meclizine (ANTIVERT) 12.5 MG tablet Take 1 tablet (12.5 mg total) by mouth 3 (three) times daily as needed for dizziness. 30 tablet 0  . metoCLOPramide (REGLAN) 10 MG tablet Take 1 tablet (10 mg total) by mouth every 6 (six) hours as needed for nausea  (nausea/headache). 10 tablet 0  . Multiple Vitamin (MULTIVITAMIN) tablet Take 1 tablet by mouth every morning.    . sertraline (ZOLOFT) 100 MG tablet Take 200 mg by mouth every morning.    . simvastatin (ZOCOR) 80 MG tablet Take 80 mg by mouth every morning.    . traMADol (ULTRAM) 50 MG tablet Take 1 tablet (50 mg total) by mouth every 6 (six) hours as needed. 15 tablet 0  . DOXYCYCLINE CALCIUM PO Take by mouth daily.     No facility-administered medications prior to visit.    PAST MEDICAL HISTORY: Past Medical History:  Diagnosis Date  . Arthritis   . Chest pain   . Chronic back pain    "neurostimulator electrode leads overlie the lower thoracic spinal canal" - per cxr report 07/10/14  . Chronic kidney disease    kidney stone  . Coronary artery disease    NONOBSTRUCTIVE  . Depression   . Dizziness   . GERD (gastroesophageal reflux disease)   . Headache   . History of kidney stones   . Hyperlipidemia   . Hypertension   . Left foot drop    SINCE 2009 - RELATED TO BACK PROBLEM - WEARS BRACE  .  PONV (postoperative nausea and vomiting)   . Spinal stenosis   . Thyroid disease    hypothryoid    PAST SURGICAL HISTORY: Past Surgical History:  Procedure Laterality Date  . BACK SURGERY    . CARDIAC CATHETERIZATION  02/22/2009   EF 60%  . CATARACT EXTRACTION     both eyes  . COLONOSCOPY    . CYSTOSCOPY     FOR KIDNEY STONE  . LUMBAR LAMINECTOMY/DECOMPRESSION MICRODISCECTOMY    . TOTAL HIP ARTHROPLASTY Left 07/17/2014   Procedure: LEFT TOTAL HIP ARTHROPLASTY ANTERIOR APPROACH;  Surgeon: Shelda Pal, MD;  Location: [facility];  Service: Orthopedics;  Laterality: Left;    FAMILY HISTORY: Family History  Problem Relation Age of Onset  . Cancer Mother        started in gallbladder then spread to liver  . Heart attack Father   . Colon cancer Neg Hx   . Esophageal cancer Neg Hx   . Rectal cancer Neg Hx   . Stomach cancer Neg Hx     SOCIAL HISTORY: Social History    Socioeconomic History  . Marital status: Widowed    Spouse name: Not on file  . Number of children: 2  . Years of education: 63  . Highest education level: High school graduate  Occupational History  . Occupation: Retired  Tobacco Use  . Smoking status: Never Smoker  . Smokeless tobacco: Never Used  Vaping Use  . Vaping Use: Never used  Substance and Sexual Activity  . Alcohol use: No    Alcohol/week: 0.0 standard drinks  . Drug use: No  . Sexual activity: Not on file  Other Topics Concern  . Not on file  Social History Narrative   Lives with her granddaughter.   Left-handed.   No caffeine use.   Social Determinants of Health   Financial Resource Strain: Not on file  Food Insecurity: Not on file  Transportation Needs: Not on file  Physical Activity: Not on file  Stress: Not on file  Social Connections: Not on file  Intimate Partner Violence: Not on file   PHYSICAL EXAM  Vitals:   01/14/21 0721  BP: (!) 153/79  Pulse: (!) 59  Weight: 134 lb 8 oz (61 kg)  Height: 5\' 3"  (1.6 m)   Body mass index is 23.83 kg/m.   PHYSICAL EXAMNIATION:  Gen: NAD, conversant, well nourised, well groomed                     Cardiovascular: Regular rate rhythm, no peripheral edema, warm, nontender. Eyes: Conjunctivae clear without exudates or hemorrhage Neck: Supple, no carotid bruits. Pulmonary: Clear to auscultation bilaterally   NEUROLOGICAL EXAM:  MENTAL STATUS: Tearful, no aphasia, no dysarthria Montreal Cognitive Assessment  01/14/2021  Visuospatial/ Executive (0/5) 2  Naming (0/3) 3  Attention: Read list of digits (0/2) 1  Attention: Read list of letters (0/1) 1  Attention: Serial 7 subtraction starting at 100 (0/3) 1  Language: Repeat phrase (0/2) 2  Language : Fluency (0/1) 0  Abstraction (0/2) 1  Delayed Recall (0/5) 0  Orientation (0/6) 6  Total 17  Adjusted Score (based on education) 18   CRANIAL NERVES: CN II: Visual fields are full to confrontation.   Pupils are round equal and briskly reactive to light. CN III, IV, VI: extraocular movement are normal. No ptosis. CN V: Facial sensation is intact to light touch. CN VII: Face is symmetric with normal eye closure and smile. CN VIII: Hearing  is normal to casual conversation CN IX, X: Palate elevates symmetrically. Phonation is normal. CN XI: Head turning and shoulder shrug are intact CN XII: Tongue is midline with normal movements and no atrophy.  MOTOR: Left foot drop, mild right ankle dorsiflexion weakness  REFLEXES: Reflexes are 2  and symmetric at the biceps, triceps, knees and absent at ankles. Plantar responses are flexor.  SENSORY: Length dependent decreased light touch pinprick, vibratory sensation to mid shin level  COORDINATION: There is no trunk or limb ataxia.    GAIT/STANCE:   She needs push-up to get up from seated position, dragging left leg, left foot drop, unsteady,   DIAGNOSTIC DATA (LABS, IMAGING, TESTING) - I reviewed patient records, labs, notes, testing and imaging myself where available.  Lab Results  Component Value Date   WBC 5.4 11/15/2020   HGB 11.1 (L) 11/15/2020   HCT 33.9 (L) 11/15/2020   MCV 91.6 11/15/2020   PLT 173 11/15/2020      Component Value Date/Time   NA 138 11/15/2020 1750   K 4.0 11/15/2020 1750   CL 104 11/15/2020 1750   CO2 27 11/15/2020 1750   GLUCOSE 92 11/15/2020 1750   BUN 15 11/15/2020 1750   CREATININE 0.69 11/15/2020 1750   CALCIUM 8.7 (L) 11/15/2020 1750   PROT 6.6 11/15/2020 1750   ALBUMIN 3.9 11/15/2020 1750   AST 18 11/15/2020 1750   ALT 12 11/15/2020 1750   ALKPHOS 100 11/15/2020 1750   BILITOT 0.2 (L) 11/15/2020 1750   GFRNONAA >60 11/15/2020 1750   GFRAA >60 05/14/2018 1624   No results found for: CHOL, HDL, LDLCALC, LDLDIRECT, TRIG, CHOLHDL Lab Results  Component Value Date   HGBA1C  02/21/2009    5.7 (NOTE) The ADA recommends the following therapeutic goal for glycemic control related to Hgb A1c  measurement: Goal of therapy: <6.5 Hgb A1c  Reference: American Diabetes Association: Clinical Practice Recommendations 2010, Diabetes Care, 2010, 33: (Suppl  1).       Levert Feinstein, M.D. Ph.D.  [facility] [address] [place], [place] [address] Phone: [phone] Fax:      [phone]

## 2021-01-17 ENCOUNTER — Telehealth: Payer: Self-pay | Admitting: Neurology

## 2021-01-17 NOTE — Telephone Encounter (Signed)
The patient was just seen in the office with her daughter on 01/14/21. These same concerns were discussed at that visit. She was started on Abilify 2mg  daily. She was present with her daughter at that appt but the last DPR scanned only has her granddaughter listed. I called her granddaughter and left a detailed message that if there are safety concerns to call 911 or proceed to the ED. I requested she call back on Monday to give Sunday an update on the patient's condition.
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
## 2021-01-17 NOTE — Telephone Encounter (Signed)
[name]: I am not sure who to call back: Granddaughter, [name] is listed on DPR. No number given, I do see patient's number in chart. If daughter calls back, please advise her that she is unfortunately not listed on the DPR and we cannot discuss any details with her directly.  Also, if granddaughter calls, please advise them to call 911 or go to the ER if there is immediate concern for either patient's safety or their safety.  Also encourage them to call back during the workweek so care management can be discussed with her primary neurologist.  I am copying Dr. Terrace Arabia and her nurse as well.

## 2021-01-18 ENCOUNTER — Emergency Department (HOSPITAL_BASED_OUTPATIENT_CLINIC_OR_DEPARTMENT_OTHER)
Admission: EM | Admit: 2021-01-18 | Discharge: 2021-01-18 | Disposition: A | Discharge status: Home / Self Care | Payer: Medicare HMO | Attending: Emergency Medicine | Admitting: Emergency Medicine

## 2021-01-18 ENCOUNTER — Encounter (HOSPITAL_BASED_OUTPATIENT_CLINIC_OR_DEPARTMENT_OTHER): Payer: Self-pay | Admitting: *Deleted

## 2021-01-18 ENCOUNTER — Other Ambulatory Visit: Payer: Self-pay

## 2021-01-18 ENCOUNTER — Emergency Department (HOSPITAL_BASED_OUTPATIENT_CLINIC_OR_DEPARTMENT_OTHER): Payer: Medicare HMO | Admitting: Radiology

## 2021-01-18 DIAGNOSIS — Z7982 Long term (current) use of aspirin: Secondary | ICD-10-CM | POA: Insufficient documentation

## 2021-01-18 DIAGNOSIS — I129 Hypertensive chronic kidney disease with stage 1 through stage 4 chronic kidney disease, or unspecified chronic kidney disease: Secondary | ICD-10-CM | POA: Insufficient documentation

## 2021-01-18 DIAGNOSIS — I2510 Atherosclerotic heart disease of native coronary artery without angina pectoris: Secondary | ICD-10-CM | POA: Diagnosis not present

## 2021-01-18 DIAGNOSIS — Z96642 Presence of left artificial hip joint: Secondary | ICD-10-CM | POA: Diagnosis not present

## 2021-01-18 DIAGNOSIS — M545 Low back pain, unspecified: Secondary | ICD-10-CM

## 2021-01-18 DIAGNOSIS — E039 Hypothyroidism, unspecified: Secondary | ICD-10-CM | POA: Diagnosis not present

## 2021-01-18 DIAGNOSIS — N189 Chronic kidney disease, unspecified: Secondary | ICD-10-CM | POA: Insufficient documentation

## 2021-01-18 DIAGNOSIS — F0390 Unspecified dementia without behavioral disturbance: Secondary | ICD-10-CM | POA: Diagnosis not present

## 2021-01-18 DIAGNOSIS — Z79899 Other long term (current) drug therapy: Secondary | ICD-10-CM | POA: Insufficient documentation

## 2021-01-18 DIAGNOSIS — Y92000 Kitchen of unspecified non-institutional (private) residence as  the place of occurrence of the external cause: Secondary | ICD-10-CM | POA: Diagnosis not present

## 2021-01-18 DIAGNOSIS — W108XXA Fall (on) (from) other stairs and steps, initial encounter: Secondary | ICD-10-CM | POA: Diagnosis not present

## 2021-01-18 MED ORDER — TRAMADOL HCL 50 MG PO TABS
50.0000 mg | ORAL_TABLET | Freq: Once | ORAL | Status: AC
  Administered 2021-01-18: 50 mg via ORAL
  Filled 2021-01-18: qty 1

## 2021-01-18 MED ORDER — TRAMADOL HCL 50 MG PO TABS: 50.0000 mg | ORAL_TABLET | Freq: Four times a day (QID) | ORAL | 0 refills | Status: DC | PRN

## 2021-01-18 MED ORDER — CYCLOBENZAPRINE HCL 10 MG PO TABS: 10.0000 mg | ORAL_TABLET | Freq: Two times a day (BID) | ORAL | 0 refills | Status: DC | PRN

## 2021-01-18 NOTE — ED Notes (Signed)
Patient arrives for fall. Patient has broken three ribs per family (approx 1 month). Patient O2 sats maintaining around 89%; she was placed on 2 L [place]. O2 currently 93-94%.

## 2021-01-18 NOTE — ED Notes (Signed)
Patient instructed on use and importance of IS. Patient demonstrated good effort at this time.

## 2021-01-18 NOTE — ED Triage Notes (Addendum)
Pt presents with pain in left flan, back and neck from old fall. Pt has dementia and unclear when fall occurred. Daughter states it happened a month and a half ago. 3 broken ribs on left side and cut to face.Pt fell at home on step down to kitchen.

## 2021-01-18 NOTE — Discharge Instructions (Addendum)
Recommend 400 mg ibuprofen every 8 hours as needed for pain.  Recommend 1000 mg of Tylenol every 6 hours as needed for pain.  Take muscle relaxant and tramadol as prescribed but be careful as this medication can make you sleepy.  Recommend that you take them several hours apart from each other.  Make sure you are using your walker at all times.  Consider buying over-the-counter lidocaine patches as well.  Follow-up with your primary care doctor.

## 2021-01-18 NOTE — ED Provider Notes (Signed)
[facility]   CSN: [id number] Arrival date & time: 01/18/21  1216     History Chief Complaint  Patient presents with  . Back Pain    [name] is a 80 y.o. female.  The history is provided by the patient.  Back Pain Location:  Thoracic spine Quality:  Aching Pain severity:  Mild Onset quality:  Gradual Timing:  Intermittent Progression:  Waxing and waning Chronicity:  Recurrent Context comment:  Broke left sided posterior rib a few weeks back, still some intermittent pain Relieved by: tramadol, no longer has. Worsened by:  Palpation and twisting Ineffective treatments:  OTC medications Associated symptoms: no abdominal pain, no abdominal swelling, no bladder incontinence, no bowel incontinence, no chest pain, no dysuria, no fever, no headaches, no leg pain, no numbness, no paresthesias, no pelvic pain, no perianal numbness, no tingling, no weakness and no weight loss        Past Medical History:  Diagnosis Date  . Arthritis   . Chest pain   . Chronic back pain    "neurostimulator electrode leads overlie the lower thoracic spinal canal" - per cxr report 07/10/14  . Chronic kidney disease    kidney stone  . Coronary artery disease    NONOBSTRUCTIVE  . Depression   . Dizziness   . GERD (gastroesophageal reflux disease)   . Headache   . History of kidney stones   . Hyperlipidemia   . Hypertension   . Left foot drop    SINCE 2009 - RELATED TO BACK PROBLEM - WEARS BRACE  . PONV (postoperative nausea and vomiting)   . Spinal stenosis   . Thyroid disease    hypothryoid    Patient Active Problem List   Diagnosis Date Noted  . Dementia with behavioral disturbance (HCC) 01/14/2021  . Memory loss 09/17/2020  . 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 Left foot drop 05/18/2018  . TIA (transient ischemic attack) 05/18/2018  . Gait abnormality 05/18/2018  . 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 Hypertension   .  Hyperlipidemia   . Coronary artery disease   . Spinal stenosis     Past Surgical History:  Procedure Laterality Date  . BACK SURGERY    . CARDIAC CATHETERIZATION  02/22/2009   EF 60%  . CATARACT EXTRACTION     both eyes  . COLONOSCOPY    . CYSTOSCOPY     FOR KIDNEY STONE  . LUMBAR LAMINECTOMY/DECOMPRESSION MICRODISCECTOMY    . TOTAL HIP ARTHROPLASTY Left 07/17/2014   Procedure: LEFT TOTAL HIP ARTHROPLASTY ANTERIOR APPROACH;  Surgeon: Shelda PalMatthew D Olin, MD;  Location: [facility];  Service: Orthopedics;  Laterality: Left;     OB History   No obstetric history on file.     Family History  Problem Relation Age of Onset  . Cancer Mother        started in gallbladder then spread to liver  . Heart attack Father   . Colon cancer Neg Hx   . Esophageal cancer Neg Hx   . Rectal cancer Neg Hx   . Stomach cancer Neg Hx     Social History   Tobacco Use  . Smoking status: Never Smoker  . Smokeless tobacco: Never Used  Vaping Use  . Vaping Use: Never used  Substance Use Topics  . Alcohol use: No    Alcohol/week: 0.0 standard drinks  . Drug use: No    Home Medications Prior to Admission medications   Medication  Sig Start Date End Date Taking? Authorizing Provider  ARIPiprazole (ABILIFY) 2 MG tablet Take 1 tablet (2 mg total) by mouth daily. 01/14/21  Yes Levert Feinstein, MD  aspirin 81 MG tablet Take 81 mg by mouth daily.   Yes [provider]  Calcium Carbonate-Vit D-Min (CALCIUM 1200 PO) Take 1 tablet by mouth 2 (two) times daily.   Yes [provider]  cyclobenzaprine (FLEXERIL) 10 MG tablet Take 1 tablet (10 mg total) by mouth 2 (two) times daily as needed for muscle spasms. 01/18/21  Yes Miliyah Luper, DO  doxazosin (CARDURA) 8 MG tablet Take 8 mg by mouth every morning.   Yes [provider]  IRON PO Take 1 tablet by mouth daily.   Yes [provider]  isosorbide mononitrate (IMDUR) 60 MG 24 hr tablet Take 60 mg by mouth every morning.   Yes  [provider]  levothyroxine (SYNTHROID, LEVOTHROID) 25 MCG tablet Take by mouth daily. 05/22/15  Yes [provider]  lidocaine (LIDODERM) 5 % Place 1 patch onto the skin daily. Remove & Discard patch within 12 hours or as directed by MD 10/21/17  Yes Shaune Pollack, MD  LORazepam (ATIVAN) 0.5 MG tablet Take 0.25-0.5 mg by mouth daily as needed. 07/12/19  Yes [provider]  meclizine (ANTIVERT) 12.5 MG tablet Take 1 tablet (12.5 mg total) by mouth 3 (three) times daily as needed for dizziness. 05/14/18  Yes Charlynne Pander, MD  sertraline (ZOLOFT) 100 MG tablet Take 200 mg by mouth every morning.   Yes [provider]  simvastatin (ZOCOR) 80 MG tablet Take 80 mg by mouth every morning.   Yes [provider]  metoCLOPramide (REGLAN) 10 MG tablet Take 1 tablet (10 mg total) by mouth every 6 (six) hours as needed for nausea (nausea/headache). 05/14/18   Charlynne Pander, MD  Multiple Vitamin (MULTIVITAMIN) tablet Take 1 tablet by mouth every morning.    [provider]  traMADol (ULTRAM) 50 MG tablet Take 1 tablet (50 mg total) by mouth every 6 (six) hours as needed for up to 10 doses. 01/18/21   Virgina Norfolk, DO    Allergies    Nsaids  Review of Systems   Review of Systems  Constitutional: Negative for chills, fever and weight loss.  HENT: Negative for ear pain and sore throat.   Eyes: Negative for pain and visual disturbance.  Respiratory: Negative for cough and shortness of breath.   Cardiovascular: Negative for chest pain and palpitations.  Gastrointestinal: Negative for abdominal pain, bowel incontinence and vomiting.  Genitourinary: Negative for bladder incontinence, dysuria, hematuria and pelvic pain.  Musculoskeletal: Positive for back pain. Negative for arthralgias, gait problem, joint swelling and neck pain.  Skin: Negative for color change and rash.  Neurological: Negative for tingling, seizures, syncope, weakness,  numbness, headaches and paresthesias.  All other systems reviewed and are negative.   Physical Exam Updated Vital Signs BP 140/75 (BP Location: Right Arm)   Pulse 66   Temp 98.7 F (37.1 C) (Oral)   Resp 16   Ht 5\' 3"  (1.6 m)   Wt 60.3 kg   SpO2 93%   BMI 23.56 kg/m   Physical Exam Vitals and nursing note reviewed.  Constitutional:      General: She is not in acute distress.    Appearance: She is well-developed.  HENT:     Head: Normocephalic and atraumatic.     Nose: Nose normal.     Mouth/Throat:  Mouth: Mucous membranes are moist.  Eyes:     Extraocular Movements: Extraocular movements intact.     Conjunctiva/sclera: Conjunctivae normal.     Pupils: Pupils are equal, round, and reactive to light.  Cardiovascular:     Rate and Rhythm: Normal rate and regular rhythm.     Heart sounds: No murmur heard.   Pulmonary:     Effort: Pulmonary effort is normal. No respiratory distress.     Breath sounds: Normal breath sounds.  Abdominal:     Palpations: Abdomen is soft.     Tenderness: There is no abdominal tenderness.  Musculoskeletal:        General: Tenderness (TTP to posterior lateral left ribs) present. Normal range of motion.     Cervical back: Normal range of motion and neck supple. No tenderness.  Skin:    General: Skin is warm and dry.  Neurological:     General: No focal deficit present.     Mental Status: She is alert and oriented to person, place, and time.     Cranial Nerves: No cranial nerve deficit.     Sensory: No sensory deficit.     Motor: No weakness.     Comments: 5+ out of 5 strength throughout, normal sensation, no drift, normal finger-nose-finger     ED Results / Procedures / Treatments   Labs (all labs ordered are listed, but only abnormal results are displayed) Labs Reviewed - No data to display  EKG None  Radiology DG Ribs Unilateral W/Chest Left  Result Date: 01/18/2021 CLINICAL DATA:  Pain after fall 3 weeks ago. EXAM: LEFT  RIBS AND CHEST - 3+ VIEW COMPARISON:  None. FINDINGS: Spinal stimulator leads project over the midthoracic spine. No pneumothorax. No nodules or masses. No focal infiltrates. Hiatal hernia, moderate size. The hila and mediastinum are otherwise unremarkable. No rib fractures identified. IMPRESSION: No rib fractures.  No pneumothorax.  No acute abnormalities. Moderate hiatal hernia. Electronically Signed   By: Gerome Sam III M.D   On: 01/18/2021 13:22    Procedures Procedures   Medications Ordered in ED Medications  traMADol (ULTRAM) tablet 50 mg (has no administration in time range)    ED Course  I have reviewed the triage vital signs and the nursing notes.  Pertinent labs & imaging results that were available during my care of the patient were reviewed by me and considered in my medical decision making (see chart for details).    MDM Rules/Calculators/A&P                          Maleiya Pergola Aitken is here with left-sided upper back pain.  History of dementia.  Had fall several weeks ago and broke several ribs.  Still having some pain intermittently in the left upper back.  Vital signs are normal.  No signs of respiratory distress.  Tender in the left paraspinal muscles of the thoracic spine and lateral posterior ribs.  Chest x-ray shows no rib fractures.  No pneumonia, no pneumothorax.  Will prescribe tramadol for breakthrough pain.  Recommend muscle relaxant as well.  Recommend Tylenol.  Recommend follow-up with primary care doctor.  Discharged in good condition.  This chart was dictated using voice recognition software.  Despite best efforts to proofread,  errors can occur which can change the documentation meaning.    Final Clinical Impression(s) / ED Diagnoses Final diagnoses:  Acute left-sided low back pain without sciatica    Rx / DC  Orders ED Discharge Orders         Ordered    traMADol (ULTRAM) 50 MG tablet  Every 6 hours PRN        01/18/21 1257    cyclobenzaprine  (FLEXERIL) 10 MG tablet  2 times daily PRN        01/18/21 1257           Lalaine Overstreet, DO 01/18/21 1327

## 2021-01-20 MED ORDER — QUETIAPINE FUMARATE 25 MG PO TABS: ORAL_TABLET | ORAL | 5 refills | Status: DC

## 2021-01-20 NOTE — Telephone Encounter (Signed)
I returned the call to her granddaughter. She is in agreement to add the medication and understands the dosing instructions. She will call us back, if symptoms do not improve.

## 2021-01-20 NOTE — Telephone Encounter (Signed)
Pt's granddaughter, Salley Hews, left a voicemail asking for a call at [phone].

## 2021-01-20 NOTE — Telephone Encounter (Signed)
Patient called on-call physician during the weekend for worsening agitation, barricaded herself,  Emergency room presentation January 18, 2021 for low back pain, was given prescription of Flexeril, and tramadol as needed  She was just seen on January 14, 2021, was given Abilify, please check on her,
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
## 2021-01-20 NOTE — Telephone Encounter (Signed)
Please call patient and her family  Continue Abilify 2 mg every morning  I have added on Seroquel 25 mg 1 tablet every night to begin with, if needed, may increase to 2 tablets every night

## 2021-01-20 NOTE — Addendum Note (Signed)
Addended by: Levert Feinstein on: 01/20/2021 02:16 PM   Modules accepted: Orders

## 2021-01-23 ENCOUNTER — Telehealth: Payer: Self-pay | Admitting: Neurology

## 2021-01-23 MED ORDER — QUETIAPINE FUMARATE 25 MG PO TABS: 75.0000 mg | ORAL_TABLET | Freq: Every day | ORAL | 5 refills | Status: DC
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
## 2021-01-23 NOTE — Telephone Encounter (Signed)
Pt's granddaughter(on DPR) has called to report the QUEtiapine (SEROQUEL) 25 MG tablet is not helping, please call

## 2021-01-23 NOTE — Addendum Note (Signed)
Addended by: Lindell Spar C on: 01/23/2021 04:05 PM   Modules accepted: Orders

## 2021-01-24 ENCOUNTER — Telehealth: Payer: Self-pay | Admitting: Neurology

## 2021-01-24 NOTE — Telephone Encounter (Signed)
Called Patient Left message   [facility] Could Not take referral because of staffing  . Sending to [facility]  [phone]

## 2021-01-25 ENCOUNTER — Other Ambulatory Visit: Payer: Self-pay

## 2021-01-25 ENCOUNTER — Emergency Department (HOSPITAL_COMMUNITY): Payer: Medicare HMO

## 2021-01-25 ENCOUNTER — Emergency Department (HOSPITAL_COMMUNITY)
Admission: EM | Admit: 2021-01-25 | Discharge: 2021-01-27 | Disposition: A | Discharge status: Home / Self Care | Payer: Medicare HMO | Attending: Emergency Medicine | Admitting: Emergency Medicine

## 2021-01-25 DIAGNOSIS — F0391 Unspecified dementia with behavioral disturbance: Secondary | ICD-10-CM | POA: Insufficient documentation

## 2021-01-25 DIAGNOSIS — Z96642 Presence of left artificial hip joint: Secondary | ICD-10-CM | POA: Insufficient documentation

## 2021-01-25 DIAGNOSIS — F03918 Unspecified dementia, unspecified severity, with other behavioral disturbance: Secondary | ICD-10-CM | POA: Diagnosis present

## 2021-01-25 DIAGNOSIS — N189 Chronic kidney disease, unspecified: Secondary | ICD-10-CM | POA: Insufficient documentation

## 2021-01-25 DIAGNOSIS — Z20822 Contact with and (suspected) exposure to covid-19: Secondary | ICD-10-CM | POA: Diagnosis not present

## 2021-01-25 DIAGNOSIS — I2510 Atherosclerotic heart disease of native coronary artery without angina pectoris: Secondary | ICD-10-CM | POA: Diagnosis not present

## 2021-01-25 DIAGNOSIS — R4689 Other symptoms and signs involving appearance and behavior: Secondary | ICD-10-CM

## 2021-01-25 DIAGNOSIS — Z79899 Other long term (current) drug therapy: Secondary | ICD-10-CM | POA: Diagnosis not present

## 2021-01-25 DIAGNOSIS — E039 Hypothyroidism, unspecified: Secondary | ICD-10-CM | POA: Insufficient documentation

## 2021-01-25 DIAGNOSIS — Z7982 Long term (current) use of aspirin: Secondary | ICD-10-CM | POA: Insufficient documentation

## 2021-01-25 DIAGNOSIS — I129 Hypertensive chronic kidney disease with stage 1 through stage 4 chronic kidney disease, or unspecified chronic kidney disease: Secondary | ICD-10-CM | POA: Insufficient documentation

## 2021-01-25 LAB — CBC WITH DIFFERENTIAL/PLATELET
Abs Immature Granulocytes: 0.01 10*3/uL (ref 0.00–0.07)
Basophils Absolute: 0 10*3/uL (ref 0.0–0.1)
Basophils Relative: 0 %
Eosinophils Absolute: 0.1 10*3/uL (ref 0.0–0.5)
Eosinophils Relative: 1 %
HCT: 39.4 % (ref 36.0–46.0)
Hemoglobin: 12.9 g/dL (ref 12.0–15.0)
Immature Granulocytes: 0 %
Lymphocytes Relative: 23 %
Lymphs Abs: 1.3 10*3/uL (ref 0.7–4.0)
MCH: 30.7 pg (ref 26.0–34.0)
MCHC: 32.7 g/dL (ref 30.0–36.0)
MCV: 93.8 fL (ref 80.0–100.0)
Monocytes Absolute: 0.4 10*3/uL (ref 0.1–1.0)
Monocytes Relative: 7 %
Neutro Abs: 4 10*3/uL (ref 1.7–7.7)
Neutrophils Relative %: 69 %
Platelets: 184 10*3/uL (ref 150–400)
RBC: 4.2 MIL/uL (ref 3.87–5.11)
RDW: 13 % (ref 11.5–15.5)
WBC: 5.8 10*3/uL (ref 4.0–10.5)
nRBC: 0 % (ref 0.0–0.2)

## 2021-01-25 LAB — COMPREHENSIVE METABOLIC PANEL
ALT: 17 U/L (ref 0–44)
AST: 23 U/L (ref 15–41)
Albumin: 4.4 g/dL (ref 3.5–5.0)
Alkaline Phosphatase: 93 U/L (ref 38–126)
Anion gap: 9 (ref 5–15)
BUN: 17 mg/dL (ref 8–23)
CO2: 25 mmol/L (ref 22–32)
Calcium: 9.7 mg/dL (ref 8.9–10.3)
Chloride: 110 mmol/L (ref 98–111)
Creatinine, Ser: 0.63 mg/dL (ref 0.44–1.00)
GFR, Estimated: >60 mL/min (ref >60)
Glucose, Bld: 112 mg/dL — ABNORMAL HIGH (ref 70–99)
Potassium: 3.8 mmol/L (ref 3.5–5.1)
Sodium: 144 mmol/L (ref 135–145)
Total Bilirubin: 0.5 mg/dL (ref 0.3–1.2)
Total Protein: 7.5 g/dL (ref 6.5–8.1)

## 2021-01-25 LAB — RAPID URINE DRUG SCREEN, HOSP PERFORMED
Amphetamines: NOT DETECTED
Barbiturates: NOT DETECTED
Benzodiazepines: POSITIVE — AB
Cocaine: NOT DETECTED
Opiates: NOT DETECTED
Tetrahydrocannabinol: NOT DETECTED

## 2021-01-25 LAB — ACETAMINOPHEN LEVEL: Acetaminophen (Tylenol), Serum: <10 ug/mL — ABNORMAL LOW (ref 10–30)

## 2021-01-25 LAB — RESP PANEL BY RT-PCR (FLU A&B, COVID) ARPGX2
Influenza A by PCR: NEGATIVE
Influenza B by PCR: NEGATIVE
SARS Coronavirus 2 by RT PCR: NEGATIVE

## 2021-01-25 LAB — ETHANOL: Alcohol, Ethyl (B): <10 mg/dL (ref <10)

## 2021-01-25 LAB — SALICYLATE LEVEL: Salicylate Lvl: <7 mg/dL — ABNORMAL LOW (ref 7.0–30.0)

## 2021-01-25 MED ORDER — ACETAMINOPHEN 325 MG PO TABS
650.0000 mg | ORAL_TABLET | ORAL | Status: DC | PRN
  Administered 2021-01-26: 650 mg via ORAL
  Filled 2021-01-25: qty 2

## 2021-01-25 MED ORDER — ONDANSETRON HCL 4 MG PO TABS
4.0000 mg | ORAL_TABLET | Freq: Three times a day (TID) | ORAL | Status: DC | PRN
  Administered 2021-01-26: 4 mg via ORAL
  Filled 2021-01-25: qty 1

## 2021-01-25 NOTE — ED Notes (Signed)
Patient ambulated to RR to change into purple scrubs, and non-slip socksthree white patient belonging bags taken and secured behind nurse's station. Security to wand patient.

## 2021-01-25 NOTE — ED Notes (Signed)
Pt transferred to room 24 for TTS consult. Connected with call but video froze due to connectivity issues. TTS cart turned off and back on, plugged in and this writer attempted to call back several times with no answer. RN Huntley Dec aware and sent message to counselor regarding same. Will continue to wait for response from TTS.
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
## 2021-01-25 NOTE — ED Provider Notes (Signed)
[facility]-EMERGENCY DEPT Provider Note   CSN: [id number] Arrival date & time: 01/25/21  1615     History Chief Complaint  Patient presents with  . IVC    [name] is a 80 y.o. female.  80 yo F with a chief complaints of increased aggressive behavior at home.  Patient unfortunately has had progressive dementia and has been seeing neurology for this.  Gotten to the point where family feels she is unsafe at home.  IVC paperwork filled, sent here for placement.    Patient feels like she is still getting over pneumonia.  Otherwise feels that her mental health has been fine.  No issues at home except with her family. LVL V caveat dementia.   The history is provided by the patient.       Past Medical History:  Diagnosis Date  . Arthritis   . Chest pain   . Chronic back pain    "neurostimulator electrode leads overlie the lower thoracic spinal canal" - per cxr report 07/10/14  . Chronic kidney disease    kidney stone  . Coronary artery disease    NONOBSTRUCTIVE  . Depression   . Dizziness   . GERD (gastroesophageal reflux disease)   . Headache   . History of kidney stones   . Hyperlipidemia   . Hypertension   . Left foot drop    SINCE 2009 - RELATED TO BACK PROBLEM - WEARS BRACE  . PONV (postoperative nausea and vomiting)   . Spinal stenosis   . Thyroid disease    hypothryoid    Patient Active Problem List   Diagnosis Date Noted  . Dementia with behavioral disturbance (HCC) 01/14/2021  . Memory loss 09/17/2020  . 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 Left foot drop 05/18/2018  . TIA (transient ischemic attack) 05/18/2018  . Gait abnormality 05/18/2018  . 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 Hypertension   . Hyperlipidemia   . Coronary artery disease   . Spinal stenosis     Past Surgical History:  Procedure Laterality Date  . BACK SURGERY    . CARDIAC CATHETERIZATION  02/22/2009   EF 60%  . CATARACT EXTRACTION     both  eyes  . COLONOSCOPY    . CYSTOSCOPY     FOR KIDNEY STONE  . LUMBAR LAMINECTOMY/DECOMPRESSION MICRODISCECTOMY    . TOTAL HIP ARTHROPLASTY Left 07/17/2014   Procedure: LEFT TOTAL HIP ARTHROPLASTY ANTERIOR APPROACH;  Surgeon: Shelda Pal, MD;  Location: [facility];  Service: Orthopedics;  Laterality: Left;     OB History   No obstetric history on file.     Family History  Problem Relation Age of Onset  . Cancer Mother        started in gallbladder then spread to liver  . Heart attack Father   . Colon cancer Neg Hx   . Esophageal cancer Neg Hx   . Rectal cancer Neg Hx   . Stomach cancer Neg Hx     Social History   Tobacco Use  . Smoking status: Never Smoker  . Smokeless tobacco: Never Used  Vaping Use  . Vaping Use: Never used  Substance Use Topics  . Alcohol use: No    Alcohol/week: 0.0 standard drinks  . Drug use: No    Home Medications Prior to Admission medications   Medication Sig Start Date End Date Taking? Authorizing Provider  ARIPiprazole (ABILIFY) 2 MG tablet Take 1 tablet (2 mg total) by mouth  daily. 01/14/21   Levert Feinstein, MD  aspirin 81 MG tablet Take 81 mg by mouth daily.    [provider]  Calcium Carbonate-Vit D-Min (CALCIUM 1200 PO) Take 1 tablet by mouth 2 (two) times daily.    [provider]  cyclobenzaprine (FLEXERIL) 10 MG tablet Take 1 tablet (10 mg total) by mouth 2 (two) times daily as needed for muscle spasms. 01/18/21   Curatolo, Adam, DO  doxazosin (CARDURA) 8 MG tablet Take 8 mg by mouth every morning.    [provider]  IRON PO Take 1 tablet by mouth daily.    [provider]  isosorbide mononitrate (IMDUR) 60 MG 24 hr tablet Take 60 mg by mouth every morning.    [provider]  levothyroxine (SYNTHROID, LEVOTHROID) 25 MCG tablet Take by mouth daily. 05/22/15   [provider]  lidocaine (LIDODERM) 5 % Place 1 patch onto the skin daily. Remove & Discard patch within 12 hours or as directed  by MD 10/21/17   Shaune Pollack, MD  LORazepam (ATIVAN) 0.5 MG tablet Take 0.25-0.5 mg by mouth daily as needed. 07/12/19   [provider]  meclizine (ANTIVERT) 12.5 MG tablet Take 1 tablet (12.5 mg total) by mouth 3 (three) times daily as needed for dizziness. 05/14/18   Charlynne Pander, MD  metoCLOPramide (REGLAN) 10 MG tablet Take 1 tablet (10 mg total) by mouth every 6 (six) hours as needed for nausea (nausea/headache). 05/14/18   Charlynne Pander, MD  Multiple Vitamin (MULTIVITAMIN) tablet Take 1 tablet by mouth every morning.    [provider]  QUEtiapine (SEROQUEL) 25 MG tablet Take 3 tablets (75 mg total) by mouth at bedtime. 01/23/21   Levert Feinstein, MD  sertraline (ZOLOFT) 100 MG tablet Take 200 mg by mouth every morning.    [provider]  simvastatin (ZOCOR) 80 MG tablet Take 80 mg by mouth every morning.    [provider]  traMADol (ULTRAM) 50 MG tablet Take 1 tablet (50 mg total) by mouth every 6 (six) hours as needed for up to 10 doses. 01/18/21   Virgina Norfolk, DO    Allergies    Nsaids  Review of Systems   Review of Systems  Unable to perform ROS: Dementia    Physical Exam Updated Vital Signs BP (!) 151/75 (BP Location: Left Arm)   Pulse 94   Temp 98.6 F (37 C) (Oral)   Resp 18   Ht 5\' 3"  (1.6 m)   Wt 60.3 kg   SpO2 94%   BMI 23.56 kg/m   Physical Exam Vitals and nursing note reviewed.  Constitutional:      General: She is not in acute distress.    Appearance: She is well-developed. She is not diaphoretic.  HENT:     Head: Normocephalic and atraumatic.  Eyes:     Pupils: Pupils are equal, round, and reactive to light.  Cardiovascular:     Rate and Rhythm: Normal rate and regular rhythm.     Heart sounds: No murmur heard. No friction rub. No gallop.   Pulmonary:     Effort: Pulmonary effort is normal.     Breath sounds: No wheezing or rales.  Abdominal:     General: There is no distension.     Palpations:  Abdomen is soft.     Tenderness: There is no abdominal tenderness.  Musculoskeletal:        General: No tenderness.     Cervical back: Normal  range of motion and neck supple.  Skin:    General: Skin is warm and dry.  Neurological:     Mental Status: She is alert and oriented to person, place, and time.  Psychiatric:        Behavior: Behavior normal.     ED Results / Procedures / Treatments   Labs (all labs ordered are listed, but only abnormal results are displayed) Labs Reviewed  COMPREHENSIVE METABOLIC PANEL - Abnormal; Notable for the following components:      Result Value   Glucose, Bld 112 (*)    All other components within normal limits  ACETAMINOPHEN LEVEL - Abnormal; Notable for the following components:   Acetaminophen (Tylenol), Serum <10 (*)    All other components within normal limits  SALICYLATE LEVEL - Abnormal; Notable for the following components:   Salicylate Lvl <7.0 (*)    All other components within normal limits  RESP PANEL BY RT-PCR (FLU A&B, COVID) ARPGX2  ETHANOL  CBC WITH DIFFERENTIAL/PLATELET  RAPID URINE DRUG SCREEN, HOSP PERFORMED    EKG EKG Interpretation  Date/Time:  Saturday January 25 2021 18:40:54 EDT Ventricular Rate:  67 PR Interval:  162 QRS Duration: 78 QT Interval:  432 QTC Calculation: 456 R Axis:   -1 Text Interpretation: Normal sinus rhythm Normal ECG No significant change since last tracing Confirmed by Melene PlanFloyd, Oriyah Lamphear [phone]([id number]) on 01/25/2021 7:43:49 PM   Radiology DG Chest 2 View  Result Date: 01/25/2021 CLINICAL DATA:  Cough EXAM: CHEST - 2 VIEW COMPARISON:  01/18/2021 FINDINGS: No focal consolidation. No pleural effusion or pneumothorax. Heart and mediastinal contours are unremarkable. Moderate size hiatal hernia. No acute osseous abnormality. Spinal stimulator projecting over the midthoracic spine. IMPRESSION: No active cardiopulmonary disease. Electronically Signed   By: Elige KoHetal  Patel   On: 01/25/2021 18:24     Procedures Procedures   Medications Ordered in ED Medications  ondansetron (ZOFRAN) tablet 4 mg (has no administration in time range)  acetaminophen (TYLENOL) tablet 650 mg (has no administration in time range)    ED Course  I have reviewed the triage vital signs and the nursing notes.  Pertinent labs & imaging results that were available during my care of the patient were reviewed by me and considered in my medical decision making (see chart for details).    MDM Rules/Calculators/A&P                          80 yo F with a chief complaints of increased aggression and worsening dementia at home.  Patient has been seen by neurology and there were plans to get her into a memory care unit.  Unfortunately things worsen at home and the family felt they needed to IVC her to keep her safe.  We will have TTS evaluate.  I feel she is likely medically clear and the patient has complaining of some mild persistent cough post pneumonia though I do not see a recent visit here for pneumonia.  Will obtain a two-view chest x-ray.  Chest x-ray viewed by me without focal infiltrate or pneumothorax.  Feel she is medically clear.  The patients results and plan were reviewed and discussed.   Any x-rays performed were independently reviewed by myself.   Differential diagnosis were considered with the presenting HPI.  Medications  ondansetron (ZOFRAN) tablet 4 mg (has no administration in time range)  acetaminophen (TYLENOL) tablet 650 mg (has no administration in time range)    Vitals:  01/25/21 1636 01/25/21 1638  BP: (!) 151/75   Pulse: 94   Resp: 18   Temp: 98.6 F (37 C)   TempSrc: Oral   SpO2: 94%   Weight:  60.3 kg  Height:  5\' 3"  (1.6 m)    Final diagnoses:  Dementia with behavioral disturbance, unspecified dementia type [facility])      Final Clinical Impression(s) / ED Diagnoses Final diagnoses:  Dementia with behavioral disturbance, unspecified dementia type [facility])    Rx /  DC Orders ED Discharge Orders    None       [facility], DO 01/25/21 1944

## 2021-01-25 NOTE — ED Notes (Signed)
Patient ambulatory to restroom with steady gait and 1 person stand by assistance

## 2021-01-25 NOTE — ED Notes (Addendum)
Patient moved back to regular treatment bed. Per Ala Dach, [facility], having connectivity issues with machine. Attempted to re-establish contact with clinician, but have not heard back yet. Will re-attempt TTS when assessment team is ready and available.

## 2021-01-25 NOTE — ED Notes (Signed)
Received call from TTS, team is ready for assessment. Patient brought into room for assessment.

## 2021-01-26 NOTE — BH Assessment (Signed)
Jocelyn Abts, PA-C recommends inpatient geriatric-psychiatry treatment. Appropriate facilities will be contacted for placement.   Pamalee Leyden, [facility], [facility] Triage Specialist [phone]

## 2021-01-26 NOTE — ED Notes (Addendum)
Called daughter Terressa Koyanagi, [phone], and updated daughter on pt's status. Informed daughter that we are waiting on geriatric psych placement

## 2021-01-26 NOTE — ED Notes (Signed)
Pt ambulated to bathroom with no assistance.  
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
## 2021-01-27 DIAGNOSIS — R4689 Other symptoms and signs involving appearance and behavior: Secondary | ICD-10-CM

## 2021-01-27 DIAGNOSIS — F0391 Unspecified dementia with behavioral disturbance: Secondary | ICD-10-CM

## 2021-01-27 MED ORDER — ARIPIPRAZOLE 2 MG PO TABS
2.0000 mg | ORAL_TABLET | Freq: Every day | ORAL | Status: DC
  Administered 2021-01-27: 2 mg via ORAL
  Filled 2021-01-27: qty 1

## 2021-01-27 MED ORDER — MEMANTINE HCL 5 MG PO TABS: 5.0000 mg | ORAL_TABLET | Freq: Two times a day (BID) | ORAL | 0 refills | Status: DC

## 2021-01-27 MED ORDER — MEMANTINE HCL 5 MG PO TABS
5.0000 mg | ORAL_TABLET | Freq: Two times a day (BID) | ORAL | Status: DC
  Administered 2021-01-27: 5 mg via ORAL
  Filled 2021-01-27: qty 1

## 2021-01-27 NOTE — ED Notes (Signed)
Breakfast tray provided. Patient ambulatory w/o assistance, alert, cooperative and calm at this time.

## 2021-01-27 NOTE — Progress Notes (Signed)
.   Transition of Care [facility]) - Emergency Department Mini Assessment   Patient Details  Name: [name] MRN: [id number] Date of Birth: 04-Feb-1941  Transition of Care [facility]) CM/SW Contact:    Elliot Cousin, RN Phone Number: [phone] 01/27/2021, 5:07 PM   Clinical Narrative:  TOC CM spoke to pt and sister, [name] at bedside. Dtr wanted information on Memory Care. Explained how Memory Care and how they can get [facility] set up for pt if needed. Gave permission to start referral for [facility] who will assist pt's family with Memory Care.   ED Mini Assessment: What brought you to the Emergency Department? : behavior  Barriers to Discharge: No Barriers Identified  Barrier interventions: referral to [facility] to assist with Memory Care  Means of departure: Car  Interventions which prevented an admission or readmission: Other (must enter comment)    Patient Contact and Communications  Admission diagnosis:  IVC Patient Active Problem List   Diagnosis Date Noted  . Aggressive behavior 01/27/2021  . Dementia with behavioral disturbance (HCC) 01/14/2021  . Memory loss 09/17/2020  . 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 Left foot drop 05/18/2018  . TIA (transient ischemic attack) 05/18/2018  . Gait abnormality 05/18/2018  . 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 Hypertension   . Hyperlipidemia   . Coronary artery disease   . Spinal stenosis    PCP:  Barbie Banner, MD Pharmacy:   [facility] [phone] - [place], [place] - [id number] [place] [address] Phone: [phone] Fax: [phone]

## 2021-01-27 NOTE — ED Notes (Signed)
TTS machine placed at bedside for NP to reassess pt with other daughter, Beverely Pace, at bedside.

## 2021-01-27 NOTE — ED Notes (Signed)
Assumed care of pt at this time. Pt resting in stretcher, no s/sx of acute distress at this time.  

## 2021-01-27 NOTE — Discharge Instructions (Addendum)
Follow-up as instructed by the transition of care team
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
## 2021-01-27 NOTE — ED Notes (Signed)
Patient with daughter Corrie Dandy and she took her home. TOC to f/u for placement/ home health needs. Daughter verbalized understanding of the plan. Patient ambulatory, but confused.

## 2021-01-27 NOTE — BH Assessment (Signed)
[facility] Assessment Progress Note   Per Hillery Jacks, NP, this pt does not require psychiatric hospitalization at this time.  Pt presents under IVC initiated by pt's daughter and upheld by EDP Melene Plan, DO, which has been rescinded by Nelly Rout, MD.  Pt is psychiatrically cleared.  Pt does not require behavioral health referrals at this time, but would benefit from a neurology referral, which EDP Bethann Berkshire, MD agrees to provide.  A TOC consult is to be ordered to assist with memory care resources.  Dr Estell Harpin and pt's nurse, [place], have been notified.  Doylene Canning, MA Triage Specialist [phone]

## 2021-01-27 NOTE — ED Notes (Signed)
Pt resting in stretcher, offers no complaints. NAD.

## 2021-02-04 ENCOUNTER — Telehealth: Payer: Self-pay | Admitting: *Deleted
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
## 2021-02-05 ENCOUNTER — Other Ambulatory Visit: Payer: Self-pay | Admitting: Neurology

## 2021-04-10 ENCOUNTER — Ambulatory Visit: Payer: Medicare Other | Admitting: Neurology

## 2021-04-10 ENCOUNTER — Encounter: Payer: Self-pay | Admitting: Neurology

## 2021-04-10 VITALS — BP 140/76 | HR 63

## 2021-04-10 DIAGNOSIS — F0391 Unspecified dementia with behavioral disturbance: Secondary | ICD-10-CM

## 2021-04-10 MED ORDER — QUETIAPINE FUMARATE 25 MG PO TABS: 12.5000 mg | ORAL_TABLET | Freq: Every day | ORAL | 5 refills | Status: DC

## 2021-04-10 NOTE — Progress Notes (Signed)
PATIENT: [name] DOB: January 21, 1941  REASON FOR VISIT: Follow up HISTORY FROM: Patient Primary Neurologist: Dr. Terrace Arabia   HISTORY  [name] is a 80 year old female, seen in request by her primary care physician Dr. Andrey Campanile, Merlyn Albert for evaluation of sudden onset dizziness, gait abnormality, she is accompanied by her daughter [name], granddaughter Morrie Sheldon are at today's clinical visit.  Initial evaluation was on May 18, 2018.   She had a past medical history of hyperlipidemia, hypertension, depression, her husband passed away in 12/19/17she now lives with her granddaughter, she had a history of left lumbar radiculopathy, status decompression surgery in the past, with residual left foot drop, gait abnormality,   On May 11, 2018, she woke up early morning around 6 AM using bathroom, noticed unsteady gait, vertigo, room was spinning, nauseous, she was able to manage to use the bathroom, lying back to sleep again, few hours later, when she tried to get up again, symptoms still present, but had mild improvement, she has been symptomatic since his onset, few days later, when she went out shopping with her sister on May 14, 2018, she suddenly felt nauseous, dizziness, has to sit out waiting for her sister, ambulance was called, she was evaluated at emergency room,   I personally reviewed CTA head and neck on May 14, 2018, 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   Echocardiogram on June 22, 2018 showed ejection  fraction 55 to 60%, wall motion was normal,   Laboratory evaluation August 2019, negative troponin, CMP showed elevated glucose 117, showed hemoglobin of 11.7,   She continues to have left foot drop, unsteady gait   UPDATE Sep 17 2020: She is accompanied by her daughter at today's clinical visit, continue to have left foot drop, mild unsteady gait, wearing a left ankle brace, remain active at church, going out with her friends regularly, still driving, but daughter noticed that she has slow worsening memory loss,   Again personally reviewed CT scan without contrast in October 2019, no acute abnormality, mild supratentorium small vessel disease.   UPDATE January 14 2021: She is tearful during today's interview, slow worsening memory loss, significant frustration, daughter reported sometimes she becomes so combative, hitting things, she became very frustrated, loses her medicine, lose her keys, blame her family members, which is a change from her baseline, she has always been a capable sweet person   She fell steps, had acute CT head of the brain without contrast in February 2022, no acute abnormality, mild supratentorium small vessel disease   Laboratory evaluation in 2022 showed normal negative vitamin D, TSH, B12, lipid panel, CBC mild anemia hemoglobin of 11.4,, CMP, Falling, combative, hitting, loosing things, loosing her medication, keys, blame other come to house, sweet person, hard on her.   She was given a trial of Aricept in March 2022, did not help her, daughter reported seems to make things worse, now has stopped taking it   She was given Ativan 0.5 mg twice daily as needed for agitation, which seems to  help,  Update April 10, 2021 SS: Last saw Dr. Terrace Arabia in April 2022, Abilify added, Seroquel after multiple called about agitation, aggression. In ER 01/25/21, family got IVC paperwork, sent to ER for placement.  Apparently started Namenda following hospitalization, according to her daughter,  has made a huge difference, was already on the Abilify and Seroquel without much change.  After the behavioral episode, her daughter, Corrie Dandy, moved in with her, has been managing her medications.  Currently taking Seroquel 25 mg at bedtime, Abilify 2 mg, Namenda 10 mg AM, 5 mg PM, Ativan 1 mg, 3-4 times daily, Zoloft 200 mg daily.  Feels overmedicated, drowsy, body aches.  Mood is much improved.  She does her own ADLs, does the housework, cooking.  Does not drive.  She is tearful, reluctant to speak about memory, embarrassed about behavioral issues in past.  Both she and her daughter are quite anxious.  Just finished 2 rounds of antibiotics for UTI.  MMSE 21/30.  REVIEW OF SYSTEMS: Out of a complete 14 system review of symptoms, the patient complains only of the following symptoms, and all other reviewed systems are negative.  See HPI  ALLERGIES: Allergies  Allergen Reactions   Nsaids Other (See Comments)    GI upset    HOME MEDICATIONS: Outpatient Medications Prior to Visit  Medication Sig Dispense Refill   ARIPiprazole (ABILIFY) 2 MG tablet TAKE 1 TABLET BY MOUTH EVERY DAY 90 tablet 1   aspirin 81 MG tablet Take 81 mg by mouth daily.     Cholecalciferol (VITAMIN D) 125 MCG (5000 UT) CAPS Take 5,000 Units by mouth daily.     doxazosin (CARDURA) 8 MG tablet Take 8 mg by mouth every morning.     fluticasone (FLONASE) 50 MCG/ACT nasal spray Place 1 spray into both nostrils daily as needed for allergies or rhinitis.     IRON PO Take 325 mg by mouth daily.     isosorbide mononitrate (IMDUR) 60 MG 24 hr tablet Take 60 mg by mouth every morning.     levothyroxine (SYNTHROID) 75 MCG tablet Take 75 mcg by mouth daily.     LORazepam (ATIVAN) 1 MG tablet Take 1 mg by mouth every 8 (eight) hours as needed for anxiety.     meclizine (ANTIVERT) 12.5 MG tablet Take 1 tablet (12.5 mg total) by mouth 3 (three) times daily as needed for dizziness. (Patient not taking: No sig reported) 30 tablet 0    memantine (NAMENDA) 5 MG tablet Take 1 tablet (5 mg total) by mouth 2 (two) times daily. 60 tablet 0   metoCLOPramide (REGLAN) 10 MG tablet Take 1 tablet (10 mg total) by mouth every 6 (six) hours as needed for nausea (nausea/headache). (Patient not taking: No sig reported) 10 tablet 0   Multiple Vitamin (MULTIVITAMIN) tablet Take 1 tablet by mouth every morning.     QUEtiapine (SEROQUEL) 25 MG tablet Take 3 tablets (75 mg total) by mouth at bedtime. (Patient taking differently: Take 25-50 mg by mouth at bedtime.) 90 tablet 5   sertraline (ZOLOFT) 100 MG tablet Take 200 mg by mouth every morning.     simvastatin (ZOCOR) 40 MG tablet Take 40 mg by mouth at bedtime.     traMADol (ULTRAM) 50 MG tablet Take 1 tablet (50 mg total) by mouth every 6 (six) hours as needed for up to 10 doses. (Patient not taking: No sig reported) 10 tablet 0   cyclobenzaprine (FLEXERIL) 10 MG tablet Take 1 tablet (10 mg total) by  mouth 2 (two) times daily as needed for muscle spasms. 20 tablet 0   lidocaine (LIDODERM) 5 % Place 1 patch onto the skin daily. Remove & Discard patch within 12 hours or as directed by MD (Patient not taking: No sig reported) 30 patch 0   meclizine (ANTIVERT) 25 MG tablet Take 25 mg by mouth 3 (three) times daily as needed for dizziness or nausea.     No facility-administered medications prior to visit.    PAST MEDICAL HISTORY: Past Medical History:  Diagnosis Date   Arthritis    Chest pain    Chronic back pain    "neurostimulator electrode leads overlie the lower thoracic spinal canal" - per cxr report 07/10/14   Chronic kidney disease    kidney stone   Coronary artery disease    NONOBSTRUCTIVE   Depression    Dizziness    GERD (gastroesophageal reflux disease)    Headache    History of kidney stones    Hyperlipidemia    Hypertension    Left foot drop    SINCE 2009 - RELATED TO BACK PROBLEM - WEARS BRACE   PONV (postoperative nausea and vomiting)    Spinal stenosis    Thyroid  disease    hypothryoid    PAST SURGICAL HISTORY: Past Surgical History:  Procedure Laterality Date   BACK SURGERY     CARDIAC CATHETERIZATION  02/22/2009   EF 60%   CATARACT EXTRACTION     both eyes   COLONOSCOPY     CYSTOSCOPY     FOR KIDNEY STONE   LUMBAR LAMINECTOMY/DECOMPRESSION MICRODISCECTOMY     TOTAL HIP ARTHROPLASTY Left 07/17/2014   Procedure: LEFT TOTAL HIP ARTHROPLASTY ANTERIOR APPROACH;  Surgeon: Shelda PalMatthew D Olin, MD;  Location: [facility];  Service: Orthopedics;  Laterality: Left;    FAMILY HISTORY: Family History  Problem Relation Age of Onset   Cancer Mother        started in gallbladder then spread to liver   Heart attack Father    Colon cancer Neg Hx    Esophageal cancer Neg Hx    Rectal cancer Neg Hx    Stomach cancer Neg Hx     SOCIAL HISTORY: Social History   Socioeconomic History   Marital status: Widowed    Spouse name: Not on file   Number of children: 2   Years of education: 12   Highest education level: High school graduate  Occupational History   Occupation: Retired  Tobacco Use   Smoking status: Never   Smokeless tobacco: Never  Vaping Use   Vaping Use: Never used  Substance and Sexual Activity   Alcohol use: No    Alcohol/week: 0.0 standard drinks   Drug use: No   Sexual activity: Not on file  Other Topics Concern   Not on file  Social History Narrative   Lives with her granddaughter.   Left-handed.   No caffeine use.   Social Determinants of Health   Financial Resource Strain: Not on file  Food Insecurity: Not on file  Transportation Needs: Not on file  Physical Activity: Not on file  Stress: Not on file  Social Connections: Not on file  Intimate Partner Violence: Not on file   PHYSICAL EXAM  Vitals:   04/10/21 1542  BP: 140/76  Pulse: 63   There is no height or weight on file to calculate BMI.  Generalized: Well developed, in no acute distress  MMSE - Mini Mental State Exam 04/10/2021 09/17/2020  Orientation  to  time 4 5  Orientation to Place 4 5  Registration 3 3  Attention/ Calculation 0 3  Recall 2 2  Language- name 2 objects 2 2  Language- repeat 1 0  Language- follow 3 step command 3 3  Language- read & follow direction 1 1  Write a sentence 1 0  Copy design 0 0  Total score 21 24   Neurological examination  Mentation: Alert, tearful, anxious, most history is provided by her daughter, patient is reluctant to provide information. Follows all commands speech and language fluent Cranial nerve II-XII: Pupils were equal round reactive to light. Extraocular movements were full, visual field were full on confrontational test. Facial sensation and strength were normal. Head turning and shoulder shrug  were normal and symmetric. Motor: Good strength all extremities, left foot drop Sensory: Sensory testing is intact to soft touch on all 4 extremities. No evidence of extinction is noted.  Coordination: Cerebellar testing reveals good finger-nose-finger and heel-to-shin bilaterally.  Gait and station: Has to push off from seated position, left foot drop Reflexes: Deep tendon reflexes are symmetric but increased at knees   DIAGNOSTIC DATA (LABS, IMAGING, TESTING) - I reviewed patient records, labs, notes, testing and imaging myself where available.  Lab Results  Component Value Date   WBC 5.8 01/25/2021   HGB 12.9 01/25/2021   HCT 39.4 01/25/2021   MCV 93.8 01/25/2021   PLT 184 01/25/2021      Component Value Date/Time   NA 144 01/25/2021 1746   K 3.8 01/25/2021 1746   CL 110 01/25/2021 1746   CO2 25 01/25/2021 1746   GLUCOSE 112 (H) 01/25/2021 1746   BUN 17 01/25/2021 1746   CREATININE 0.63 01/25/2021 1746   CALCIUM 9.7 01/25/2021 1746   PROT 7.5 01/25/2021 1746   ALBUMIN 4.4 01/25/2021 1746   AST 23 01/25/2021 1746   ALT 17 01/25/2021 1746   ALKPHOS 93 01/25/2021 1746   BILITOT 0.5 01/25/2021 1746   GFRNONAA >60 01/25/2021 1746   GFRAA >60 05/14/2018 1624   No results found for:  CHOL, HDL, LDLCALC, LDLDIRECT, TRIG, CHOLHDL Lab Results  Component Value Date   HGBA1C  02/21/2009    5.7 (NOTE) The ADA recommends the following therapeutic goal for glycemic control related to Hgb A1c measurement: Goal of therapy: <6.5 Hgb A1c  Reference: American Diabetes Association: Clinical Practice Recommendations 2010, Diabetes Care, 2010, 33: (Suppl  1).   Lab Results  Component Value Date   VITAMINB12 333 09/17/2020   Lab Results  Component Value Date   TSH 2.830 09/17/2020      ASSESSMENT AND PLAN 80 y.o. year old female  has a past medical history of Arthritis, Chest pain, Chronic back pain, Chronic kidney disease, Coronary artery disease, Depression, Dizziness, GERD (gastroesophageal reflux disease), Headache, History of kidney stones, Hyperlipidemia, Hypertension, Left foot drop, PONV (postoperative nausea and vomiting), Spinal stenosis, and Thyroid disease. here with:  Dementia with agitation -Doing better following ER visit, daughter claims Candiss Norse has made the biggest difference with memory and mood -Feels overmedicated, drowsy, will decrease Seroquel 12.5 mg at bedtime for 1-2 weeks if no behavioral symptoms will discontinue -Continue Abilify 2 mg daily for now, however could consider backing down 1 mg daily, but I feel may be necessary, given anxiety, mood issues  -Continue Namenda at current dosing (10 mg AM/5Mg  PM, cannot tolerate 10/10) -Encouraged to remain within prescribing parameters of Ativan, only take when needed for anxiety             -  CT head of the brain showed no acute abnormality, supratentorium small vessel disease -Central nervous system degenerative disorder, 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 Acute onset of vertigo, unsteady gait in August 2019,             Possible brainstem/cerebellum TIA             CT head showed no acute abnormality, not MRI candidate due to spinal cord stimulator                    Vascular risk factor of aging, hyperlipidemia, hypothyroidism, hypertension, coronary artery disease             Continue aspirin 81 mg daily             Echocardiogram showed ejection fraction 55 to 60%    3. Incidental finding of 2 mm right MCA aneurysm  4. Left foot drop              Residual deficit from left lumbar radiculopathy             Improved with left AFO  I spent 35 minutes of face-to-face and non-face-to-face time with patient.  This included previsit chart review, lab review, study review, had a lengthy conversation about medications, side effects, medication plan, management, and follow-up.  Margie Ege, AGNP-C, DNP 04/10/2021, 3:49 PM [facility] [address] [place], [place] [address] [phone]

## 2021-04-10 NOTE — Patient Instructions (Signed)
Cut back the Seroquel to 12.5 mg at bedtime for 1-2 weeks, if no behavorial symptoms, stop it  Wait a few weeks, if she is still doing well, we can consider cutting back the Abilify, call me before we change the Abilify Recommend not taking any more than prescribed Ativan, only take when needed Call for any medication adjustments See you back in 6 months

## 2021-06-11 NOTE — Progress Notes (Signed)
Chart reviewed, agree above plan ?

## 2021-06-26 ENCOUNTER — Telehealth: Payer: Self-pay | Admitting: Neurology

## 2021-06-26 MED ORDER — MEMANTINE HCL 10 MG PO TABS: 10.0000 mg | ORAL_TABLET | Freq: Two times a day (BID) | ORAL | 5 refills | Status: DC

## 2021-06-26 NOTE — Telephone Encounter (Addendum)
Left messages for a return call (on home and mobile numbers).   Per last note on 04/10/21: Continue Namenda at current dosing (10 mg AM/5Mg  PM, cannot tolerate 10/10).  Need to confirm how she is currently taking the medication.

## 2021-06-26 NOTE — Telephone Encounter (Signed)
I spoke to the patient's daughter on [place]. States her mother was able to titrate her dose of memantine 5mg  to 2 tabs BID. Since she is tolerating the higher dosage, we will send in a new rx for 10mg  tabs, taking 1 BID. This way she will not have to swallow as many tablets. Her daughter verbalized understanding of this plan and was in agreement.

## 2021-06-26 NOTE — Telephone Encounter (Signed)
Pt's daughter called states her mothers memantine (NAMENDA) 5 MG tablet is wrong. She states it should be directed to 4 pills and not just 1 pill a day.

## 2021-07-23 ENCOUNTER — Other Ambulatory Visit: Payer: Self-pay | Admitting: Neurology

## 2021-07-28 ENCOUNTER — Telehealth: Payer: Self-pay | Admitting: Neurology

## 2021-07-28 NOTE — Telephone Encounter (Signed)
Attempted to call pt, LVM.  Pt should have refills on this medication.  They need to contact pharmacy.

## 2021-07-28 NOTE — Telephone Encounter (Signed)
Pt's daughter requesting refill for QUEtiapine (SEROQUEL) 25 MG tablet. Pharmacy [facility] [phone] - [place], [place] - [address]. Pt's daughter requesting a call back.

## 2021-07-29 MED ORDER — QUETIAPINE FUMARATE 25 MG PO TABS: 25.0000 mg | ORAL_TABLET | Freq: Every day | ORAL | 11 refills | Status: DC

## 2021-07-29 NOTE — Telephone Encounter (Signed)
Meds ordered this encounter  Medications   QUEtiapine (SEROQUEL) 25 MG tablet    Sig: Take 1 tablet (25 mg total) by mouth at bedtime.    Dispense:  30 tablet    Refill:  11    Void all other refill on file for this medication. This rx reflects most recent dose change.

## 2021-07-29 NOTE — Telephone Encounter (Signed)
I called [facility]. I spoke with Jocelyn Batten. Patient last picked up refill on quetiapine on 0/14/2022. It is too early for her to pick up another RX.  I called patient's daughter, [name], per DPR. When patient's daughter cuts the quetiapine 25mg  in half, it crushes at least half of it since the pill is so small. She is only taking 1/2 tablet but she can't use both halves sometimes because of the crushing. Therefore, she runs out early.  Pharmacy suggested changing the RX to quetiapine 25mg  1 tablet QHS while the daughter knows to only do 1/2 tablet QHS. This way, if the half crushes, they can still get a refill. I advised her that I would run this by Dr. .

## 2021-07-29 NOTE — Telephone Encounter (Signed)
Dr. Terrace Arabia approved the seroquel change.  I called patient's daughter, Corrie Dandy, per DPR, to discuss. No answer, left a message asking her to call us back.  We need to reiterate that while the quetiapine RX says 1 tablet, she really should only take 1/2 tablet.

## 2021-07-29 NOTE — Telephone Encounter (Signed)
Pt's daughter, Beverely Pace (on [place]) called, pharmacy said could not get a refill until 11/14, but could get a certain amount on 11/10 in order for insurance to pay for it. Would like a call from the nurse.

## 2021-07-29 NOTE — Addendum Note (Signed)
Addended by: Levert Feinstein on: 07/29/2021 02:55 PM   Modules accepted: Orders

## 2021-07-31 NOTE — Telephone Encounter (Signed)
I called patient's daughter, Corrie Dandy, per DPR, again to discuss. No answer, left a message asking her to call us back.

## 2021-08-11 ENCOUNTER — Other Ambulatory Visit: Payer: Self-pay | Admitting: Neurology

## 2021-08-11 ENCOUNTER — Telehealth: Payer: Self-pay | Admitting: Neurology

## 2021-08-11 MED ORDER — ARIPIPRAZOLE 2 MG PO TABS: 2.0000 mg | ORAL_TABLET | Freq: Every day | ORAL | 0 refills | Status: DC

## 2021-08-11 NOTE — Telephone Encounter (Signed)
Refill sent to requested pharmacy.

## 2021-08-11 NOTE — Telephone Encounter (Signed)
Pt is requesting a refill for ARIPiprazole (ABILIFY) 2 MG tablet.  Pharmacy: [facility] [phone]

## 2021-08-22 ENCOUNTER — Other Ambulatory Visit: Payer: Self-pay | Admitting: Neurology

## 2021-08-25 ENCOUNTER — Telehealth: Payer: Self-pay | Admitting: Neurology

## 2021-08-25 NOTE — Telephone Encounter (Signed)
Left message requesting a return call.

## 2021-08-25 NOTE — Telephone Encounter (Signed)
Pt returned phone call, I know you probably was getting ready to go home. Please call me tomorrow.

## 2021-08-25 NOTE — Telephone Encounter (Signed)
Daughter [name] on [place] called stating that the [name] has made a drastic change this past week. [name] is more forgetful, gets upset real easily and has been crying for the past couple of days. Daughter is wanting to know if her memantine (NAMENDA) 10 MG tablet can be increased. Please advise.

## 2021-08-25 NOTE — Telephone Encounter (Signed)
I spoke to the patient's daughter. Reports her mother has been more emotional this week. Easily agitated, increase in crying episodes. Even packed her suitcase today and said she was going to move out. Her daughter says she has a prescription for generic Seroquel 25mg . Her PCP told her to give her one tablet at bedtime rather than half tablet. She is going to try this to see if it helps. Says she was instructed to contact if it does not. She is also giving her lorazepam 1mg , typically one tablet in the morning and half tablet at bedtime.

## 2021-09-01 NOTE — Telephone Encounter (Signed)
Pt's daughter, [name] (on [place]) called, had gotten better when dosage for Seroquel was increased, but she has stared crying again and real agitated. Would like from the nurse.  Contact info:[phone]

## 2021-09-01 NOTE — Telephone Encounter (Signed)
Multiple phone calls following last visit in July 22, if she is not getting any better, may consider early follow-up with nurse practitioner

## 2021-09-01 NOTE — Telephone Encounter (Signed)
I spoke to the patient's daughter. States her mother's agitation is much worse over the last week. Frequent crying spells. She is currently taking quetiapine 25mg , one tablet at bedtime. She is also on lorazepam 1mg , one tablet in the morning and half-tablet at bedtime.   This behavior has worsened over the last week. I asked about any signs & symptoms of infection. Her daughter reports increased frequency in urination, bilateral flank pain, abdominal pain and vomiting.   I instructed her to make sure she is staying well hydrated and to see her PCP to rule out infection.

## 2021-09-30 ENCOUNTER — Other Ambulatory Visit: Payer: Self-pay | Admitting: Neurology

## 2021-10-15 ENCOUNTER — Telehealth: Payer: Self-pay | Admitting: Neurology

## 2021-10-15 NOTE — Telephone Encounter (Signed)
Lvm for patient to call back to rs due to sarah out

## 2021-10-20 ENCOUNTER — Ambulatory Visit: Payer: Medicare Other | Admitting: Neurology

## 2021-10-31 ENCOUNTER — Other Ambulatory Visit: Payer: Self-pay | Admitting: Neurology

## 2021-11-03 ENCOUNTER — Other Ambulatory Visit: Payer: Self-pay | Admitting: Neurology

## 2021-11-07 ENCOUNTER — Encounter (HOSPITAL_BASED_OUTPATIENT_CLINIC_OR_DEPARTMENT_OTHER): Payer: Self-pay | Admitting: *Deleted

## 2021-11-07 ENCOUNTER — Emergency Department (HOSPITAL_BASED_OUTPATIENT_CLINIC_OR_DEPARTMENT_OTHER): Payer: Medicare Other

## 2021-11-07 ENCOUNTER — Emergency Department (HOSPITAL_BASED_OUTPATIENT_CLINIC_OR_DEPARTMENT_OTHER)
Admission: EM | Admit: 2021-11-07 | Discharge: 2021-11-07 | Disposition: A | Discharge status: Home / Self Care | Payer: Medicare Other | Attending: Emergency Medicine | Admitting: Emergency Medicine

## 2021-11-07 ENCOUNTER — Other Ambulatory Visit: Payer: Self-pay

## 2021-11-07 DIAGNOSIS — R10811 Right upper quadrant abdominal tenderness: Secondary | ICD-10-CM | POA: Insufficient documentation

## 2021-11-07 DIAGNOSIS — I129 Hypertensive chronic kidney disease with stage 1 through stage 4 chronic kidney disease, or unspecified chronic kidney disease: Secondary | ICD-10-CM | POA: Insufficient documentation

## 2021-11-07 DIAGNOSIS — M25562 Pain in left knee: Secondary | ICD-10-CM | POA: Diagnosis not present

## 2021-11-07 DIAGNOSIS — Z7982 Long term (current) use of aspirin: Secondary | ICD-10-CM | POA: Diagnosis not present

## 2021-11-07 DIAGNOSIS — W19XXXA Unspecified fall, initial encounter: Secondary | ICD-10-CM | POA: Diagnosis not present

## 2021-11-07 DIAGNOSIS — N189 Chronic kidney disease, unspecified: Secondary | ICD-10-CM | POA: Diagnosis not present

## 2021-11-07 DIAGNOSIS — K449 Diaphragmatic hernia without obstruction or gangrene: Secondary | ICD-10-CM | POA: Insufficient documentation

## 2021-11-07 DIAGNOSIS — F0390 Unspecified dementia without behavioral disturbance: Secondary | ICD-10-CM | POA: Insufficient documentation

## 2021-11-07 DIAGNOSIS — I2510 Atherosclerotic heart disease of native coronary artery without angina pectoris: Secondary | ICD-10-CM | POA: Diagnosis not present

## 2021-11-07 DIAGNOSIS — R0789 Other chest pain: Secondary | ICD-10-CM | POA: Insufficient documentation

## 2021-11-07 LAB — CBC WITH DIFFERENTIAL/PLATELET
Abs Immature Granulocytes: 0.02 10*3/uL (ref 0.00–0.07)
Basophils Absolute: 0 10*3/uL (ref 0.0–0.1)
Basophils Relative: 0 %
Eosinophils Absolute: 0.3 10*3/uL (ref 0.0–0.5)
Eosinophils Relative: 5 %
HCT: 38.6 % (ref 36.0–46.0)
Hemoglobin: 12.9 g/dL (ref 12.0–15.0)
Immature Granulocytes: 0 %
Lymphocytes Relative: 23 %
Lymphs Abs: 1.4 10*3/uL (ref 0.7–4.0)
MCH: 31.9 pg (ref 26.0–34.0)
MCHC: 33.4 g/dL (ref 30.0–36.0)
MCV: 95.3 fL (ref 80.0–100.0)
Monocytes Absolute: 0.4 10*3/uL (ref 0.1–1.0)
Monocytes Relative: 6 %
Neutro Abs: 4.1 10*3/uL (ref 1.7–7.7)
Neutrophils Relative %: 66 %
Platelets: 162 10*3/uL (ref 150–400)
RBC: 4.05 MIL/uL (ref 3.87–5.11)
RDW: 11.9 % (ref 11.5–15.5)
WBC: 6.2 10*3/uL (ref 4.0–10.5)
nRBC: 0 % (ref 0.0–0.2)

## 2021-11-07 LAB — URINALYSIS, ROUTINE W REFLEX MICROSCOPIC
Bilirubin Urine: NEGATIVE
Glucose, UA: NEGATIVE mg/dL
Ketones, ur: NEGATIVE mg/dL
Leukocytes,Ua: NEGATIVE
Nitrite: NEGATIVE
Protein, ur: NEGATIVE mg/dL
Specific Gravity, Urine: 1.015 (ref 1.005–1.030)
pH: 6 (ref 5.0–8.0)

## 2021-11-07 LAB — COMPREHENSIVE METABOLIC PANEL
ALT: 23 U/L (ref 0–44)
AST: 28 U/L (ref 15–41)
Albumin: 4 g/dL (ref 3.5–5.0)
Alkaline Phosphatase: 94 U/L (ref 38–126)
Anion gap: 7 (ref 5–15)
BUN: 16 mg/dL (ref 8–23)
CO2: 26 mmol/L (ref 22–32)
Calcium: 9 mg/dL (ref 8.9–10.3)
Chloride: 104 mmol/L (ref 98–111)
Creatinine, Ser: 0.85 mg/dL (ref 0.44–1.00)
GFR, Estimated: >60 mL/min (ref >60)
Glucose, Bld: 117 mg/dL — ABNORMAL HIGH (ref 70–99)
Potassium: 3.9 mmol/L (ref 3.5–5.1)
Sodium: 137 mmol/L (ref 135–145)
Total Bilirubin: 0.6 mg/dL (ref 0.3–1.2)
Total Protein: 6.8 g/dL (ref 6.5–8.1)

## 2021-11-07 LAB — URINALYSIS, MICROSCOPIC (REFLEX): WBC, UA: NONE SEEN WBC/hpf (ref 0–5)

## 2021-11-07 MED ORDER — ONDANSETRON HCL 4 MG/2ML IJ SOLN
4.0000 mg | Freq: Once | INTRAMUSCULAR | Status: AC
  Administered 2021-11-07: 4 mg via INTRAVENOUS
  Filled 2021-11-07: qty 2

## 2021-11-07 MED ORDER — FENTANYL CITRATE PF 50 MCG/ML IJ SOSY
50.0000 ug | PREFILLED_SYRINGE | Freq: Once | INTRAMUSCULAR | Status: AC
  Administered 2021-11-07: 50 ug via INTRAVENOUS
  Filled 2021-11-07: qty 1

## 2021-11-07 MED ORDER — OXYCODONE-ACETAMINOPHEN 5-325 MG PO TABS
1.0000 | ORAL_TABLET | Freq: Once | ORAL | Status: AC
  Administered 2021-11-07: 1 via ORAL
  Filled 2021-11-07: qty 1

## 2021-11-07 MED ORDER — IOHEXOL 300 MG/ML  SOLN
100.0000 mL | Freq: Once | INTRAMUSCULAR | Status: AC | PRN
  Administered 2021-11-07: 100 mL via INTRAVENOUS

## 2021-11-07 MED ORDER — LIDOCAINE 5 % EX PTCH: 1.0000 | MEDICATED_PATCH | CUTANEOUS | 0 refills | Status: DC

## 2021-11-07 MED ORDER — OXYCODONE-ACETAMINOPHEN 5-325 MG PO TABS: 1.0000 | ORAL_TABLET | Freq: Four times a day (QID) | ORAL | 0 refills | Status: DC | PRN

## 2021-11-07 NOTE — ED Notes (Signed)
91-93% Pulse ox with ambulation.  However, pt has dark nail polish and when pulse ox placed on mid finger it reads 97%

## 2021-11-07 NOTE — ED Provider Notes (Signed)
[facility] EMERGENCY DEPARTMENT Provider Note   CSN: [id number] Arrival date & time: 11/07/21  1030     History  Chief Complaint  Patient presents with   Leanora Ivanoff Prucha is a 81 y.o. female.  HPI   Pt is a 73 female with a h/o chronic back pain, CKD, CAD, depression, GERD, HA, nephrolithiasis, HLD, HTN, left foot drop, spinal stenosis, thyroid disease, who presents to the ED today for eval after a fall that occurred yesterday. Daughter is at bedside asists with hx. States pt was groggy and fell while going to the bathroom last night. She was unable get up off the floor independantly. She thinks that the she hit her head. She denies LOC. Pt has been c/o pain to the right side and c/o left knee pain.  Of note pt has also been dx with a uti and has been on multiple abx for this but she still has sxs.   Home Medications Prior to Admission medications   Medication Sig Start Date End Date Taking? Authorizing Provider  lidocaine (LIDODERM) 5 % Place 1 patch onto the skin daily. Remove & Discard patch within 12 hours or as directed by MD 11/07/21  Yes Claudio Mondry S, PA-C  oxyCODONE-acetaminophen (PERCOCET/ROXICET) 5-325 MG tablet Take 1 tablet by mouth every 6 (six) hours as needed for severe pain. 11/07/21  Yes Anilah Huck S, PA-C  ARIPiprazole (ABILIFY) 2 MG tablet TAKE 1 TABLET BY MOUTH EVERY DAY 11/03/21   Marcial Pacas, MD  aspirin 81 MG tablet Take 81 mg by mouth daily.    [provider]  Cholecalciferol (VITAMIN D) 125 MCG (5000 UT) CAPS Take 5,000 Units by mouth daily.    [provider]  dimenhyDRINATE (DRAMAMINE PO) Take 1 tablet by mouth as needed.    [provider]  doxazosin (CARDURA) 8 MG tablet Take 8 mg by mouth every morning.    [provider]  fluticasone (FLONASE) 50 MCG/ACT nasal spray Place 1 spray into both nostrils daily as needed for allergies or rhinitis.    [provider]  IRON PO Take 325 mg by  mouth daily.    [provider]  isosorbide mononitrate (IMDUR) 60 MG 24 hr tablet Take 60 mg by mouth every morning.    [provider]  levothyroxine (SYNTHROID) 75 MCG tablet Take 75 mcg by mouth daily. 12/18/20   [provider]  LORazepam (ATIVAN) 1 MG tablet Take 1 mg by mouth every 8 (eight) hours as needed for anxiety. 01/20/21   [provider]  memantine (NAMENDA) 10 MG tablet TAKE 1 TABLET BY MOUTH TWICE A DAY 07/23/21   Marcial Pacas, MD  Multiple Vitamin (MULTIVITAMIN) tablet Take 1 tablet by mouth every morning.    [provider]  QUEtiapine (SEROQUEL) 25 MG tablet TAKE 1 TABLET BY MOUTH EVERYDAY AT BEDTIME 08/27/21   Marcial Pacas, MD  sertraline (ZOLOFT) 100 MG tablet Take 200 mg by mouth every morning.    [provider]  simvastatin (ZOCOR) 40 MG tablet Take 40 mg by mouth at bedtime. 10/31/20   [provider]      Allergies    Nsaids    Review of Systems   Review of Systems See HPI for pertinent positives or negatives.   Physical Exam Updated Vital Signs BP (!) 155/76    Pulse (!) 59    Temp 98.2 F (36.8 C) (Oral)    Resp 14  Ht 5\' 4"  (1.626 m)    Wt 61.2 kg    SpO2 94%    BMI 23.17 kg/m  Physical Exam Vitals and nursing note reviewed.  Constitutional:      General: She is not in acute distress.    Appearance: She is well-developed.  HENT:     Head: Normocephalic and atraumatic.  Eyes:     Conjunctiva/sclera: Conjunctivae normal.  Cardiovascular:     Rate and Rhythm: Normal rate and regular rhythm.     Heart sounds: No murmur heard. Pulmonary:     Effort: Pulmonary effort is normal. No respiratory distress.     Breath sounds: Rales (bibasilar) present.  Chest:     Chest wall: Tenderness (right chest wall ttp) present.  Abdominal:     Palpations: Abdomen is soft.     Tenderness: There is abdominal tenderness (ruq). There is guarding.  Musculoskeletal:        General: No swelling.     Cervical  back: Neck supple.     Comments: No midline ctl spine ttp. Ttp to the left knee with ecchymosis. No pain with rom of the bilat lower extremities  Skin:    General: Skin is warm and dry.     Capillary Refill: Capillary refill takes less than 2 seconds.  Neurological:     Mental Status: She is alert.     Comments: Mental Status:  Alert, thought content appropriate, able to give a coherent history. Speech fluent without evidence of aphasia. Able to follow 2 step commands without difficulty.  Cranial Nerves:  II:  pupils equal, round, reactive to light III,IV, VI: ptosis not present, extra-ocular motions intact bilaterally  V,VII: smile symmetric, facial light touch sensation equal VIII: hearing grossly normal to voice  X: uvula elevates symmetrically  XI: bilateral shoulder shrug symmetric and strong XII: midline tongue extension without fassiculations Motor:  Normal tone. 5/5 strength of BUE and BLE major muscle groups including strong and equal grip strength and dorsiflexion/plantar flexion Sensory: light touch normal in all extremities.   Psychiatric:        Mood and Affect: Mood normal.    ED Results / Procedures / Treatments   Labs (all labs ordered are listed, but only abnormal results are displayed) Labs Reviewed  URINALYSIS, ROUTINE W REFLEX MICROSCOPIC - Abnormal; Notable for the following components:      Result Value   Hgb urine dipstick TRACE (*)    All other components within normal limits  COMPREHENSIVE METABOLIC PANEL - Abnormal; Notable for the following components:   Glucose, Bld 117 (*)    All other components within normal limits  URINALYSIS, MICROSCOPIC (REFLEX) - Abnormal; Notable for the following components:   Bacteria, UA RARE (*)    All other components within normal limits  CBC WITH DIFFERENTIAL/PLATELET    EKG None  Radiology DG Ribs Unilateral W/Chest Right  Result Date: 11/07/2021 CLINICAL DATA:  RIGHT-sided flank pain EXAM: RIGHT RIBS AND  CHEST - 3+ VIEW COMPARISON:  None. FINDINGS: Normal cardiac silhouette. Spinal stimulator device projects over the middle mediastinum. Large hiatal hernia posterior to the heart. Dedicated view of the RIGHT ribs demonstrate no fracture. IMPRESSION: 1. No rib fracture. 2. Large hiatal hernia. Electronically Signed   By: Suzy Bouchard M.D.   On: 11/07/2021 13:40   CT Head Wo Contrast  Result Date: 11/07/2021 CLINICAL DATA:  [place] during the night, LEFT side pain, moderate to severe head trauma EXAM: CT HEAD WITHOUT CONTRAST TECHNIQUE: Contiguous axial  images were obtained from the base of the skull through the vertex without intravenous contrast. RADIATION DOSE REDUCTION: This exam was performed according to the departmental dose-optimization program which includes automated exposure control, adjustment of the mA and/or kV according to patient size and/or use of iterative reconstruction technique. COMPARISON:  11/15/2020 FINDINGS: Brain: Generalized atrophy. Normal ventricular morphology. No midline shift or mass effect. Small vessel chronic ischemic changes of deep cerebral white matter. No intracranial hemorrhage, mass lesion, evidence of acute infarction, or extra-axial fluid collection. Vascular: No hyperdense vessels Skull: Demineralized but intact Sinuses/Orbits: Clear Other: N/A IMPRESSION: Atrophy with small vessel chronic ischemic changes of deep cerebral white matter. No acute intracranial abnormalities. Electronically Signed   By: Lavonia Dana M.D.   On: 11/07/2021 13:22   CT Chest Wo Contrast  Result Date: 11/07/2021 CLINICAL DATA:  Right-sided pain after fall last night.  UTI. EXAM: CT CHEST WITHOUT CONTRAST ABDOMEN AND PELVIS WITH CONTRAST TECHNIQUE: Multidetector CT imaging of the chest was performed without IV contrast material. Multidetector CT imaging of the abdomen and pelvis was performed following the standard protocol with IV contrast. RADIATION DOSE REDUCTION: This exam was performed  according to the departmental dose-optimization program which includes automated exposure control, adjustment of the mA and/or kV according to patient size and/or use of iterative reconstruction technique. COMPARISON:  October 13, 2021 FINDINGS: CT CHEST FINDINGS Cardiovascular: Aortic atherosclerosis without aneurysmal dilation. Normal caliber central pulmonary arteries. Normal size heart. Coronary artery calcifications. Calcifications of the aortic annulus. Trace pericardial and cardiac recess free fluid, likely physiologic. Mediastinum/Nodes: No discrete thyroid nodule. No pathologically enlarged mediastinal, hilar or axillary lymph nodes, noting limited sensitivity for the detection of hilar adenopathy on this noncontrast study. Large hiatal hernia. Lungs/Pleura: Biapical pleuroparenchymal scarring. Diffuse bronchial wall thickening. No evidence of parenchymal contusion or laceration. No pleural effusion. No pneumothorax. Musculoskeletal: Thoracic spondylosis. Diffuse demineralization of bone. No acute osseous abnormality. CT ABDOMEN PELVIS FINDINGS Hepatobiliary: No suspicious hepatic lesion. Gallbladder is unremarkable. No biliary ductal dilation. Pancreas: Pancreatic atrophy. No pancreatic ductal dilation or evidence of acute inflammation. Spleen: Normal in size without focal abnormality. Adrenals/Urinary Tract: Bilateral adrenal glands appear normal. No adrenal hemorrhage or renal injury identified. No hydronephrosis. 4.2 cm left renal cyst. Kidneys demonstrate symmetric enhancement and excretion of contrast material. Urinary bladder is unremarkable for degree of distension within the limitation of streak artifact from left hip arthroplasty. Stomach/Bowel: No enteric contrast was administered. Large hiatal hernia/intrathoracic stomach. No pathologic dilation of small or large bowel. The terminal ileum appears normal. The appendix is not confidently identified however there is no pericecal inflammation. No  evidence of acute bowel inflammation. Vascular/Lymphatic: Aortic atherosclerosis without aneurysmal dilation. No pathologically enlarged abdominal or pelvic lymph nodes. Reproductive: Uterus and bilateral adnexa are unremarkable within limitation of poor visualization due to streak artifact from left hip arthroplasty. Other: No significant abdominopelvic free fluid. No pneumoperitoneum. No abdominal wall hernia. Musculoskeletal: Left hip arthroplasty. Stimulating generator in the right posterior gluteal subcutaneous soft tissues with lead extending into the thoracic vertebral canal. Demineralization of bone. Spondylosis. No acute osseous abnormality. IMPRESSION: 1. No acute traumatic injury of the chest, abdomen, or pelvis. 2. Large hiatal hernia/intrathoracic stomach. 3.  Aortic Atherosclerosis (ICD10-I70.0). Electronically Signed   By: Dahlia Bailiff M.D.   On: 11/07/2021 14:49   CT ABDOMEN PELVIS W CONTRAST  Result Date: 11/07/2021 CLINICAL DATA:  Right-sided pain after fall last night.  UTI. EXAM: CT CHEST WITHOUT CONTRAST ABDOMEN AND PELVIS WITH CONTRAST TECHNIQUE: Multidetector CT  imaging of the chest was performed without IV contrast material. Multidetector CT imaging of the abdomen and pelvis was performed following the standard protocol with IV contrast. RADIATION DOSE REDUCTION: This exam was performed according to the departmental dose-optimization program which includes automated exposure control, adjustment of the mA and/or kV according to patient size and/or use of iterative reconstruction technique. COMPARISON:  October 13, 2021 FINDINGS: CT CHEST FINDINGS Cardiovascular: Aortic atherosclerosis without aneurysmal dilation. Normal caliber central pulmonary arteries. Normal size heart. Coronary artery calcifications. Calcifications of the aortic annulus. Trace pericardial and cardiac recess free fluid, likely physiologic. Mediastinum/Nodes: No discrete thyroid nodule. No pathologically enlarged  mediastinal, hilar or axillary lymph nodes, noting limited sensitivity for the detection of hilar adenopathy on this noncontrast study. Large hiatal hernia. Lungs/Pleura: Biapical pleuroparenchymal scarring. Diffuse bronchial wall thickening. No evidence of parenchymal contusion or laceration. No pleural effusion. No pneumothorax. Musculoskeletal: Thoracic spondylosis. Diffuse demineralization of bone. No acute osseous abnormality. CT ABDOMEN PELVIS FINDINGS Hepatobiliary: No suspicious hepatic lesion. Gallbladder is unremarkable. No biliary ductal dilation. Pancreas: Pancreatic atrophy. No pancreatic ductal dilation or evidence of acute inflammation. Spleen: Normal in size without focal abnormality. Adrenals/Urinary Tract: Bilateral adrenal glands appear normal. No adrenal hemorrhage or renal injury identified. No hydronephrosis. 4.2 cm left renal cyst. Kidneys demonstrate symmetric enhancement and excretion of contrast material. Urinary bladder is unremarkable for degree of distension within the limitation of streak artifact from left hip arthroplasty. Stomach/Bowel: No enteric contrast was administered. Large hiatal hernia/intrathoracic stomach. No pathologic dilation of small or large bowel. The terminal ileum appears normal. The appendix is not confidently identified however there is no pericecal inflammation. No evidence of acute bowel inflammation. Vascular/Lymphatic: Aortic atherosclerosis without aneurysmal dilation. No pathologically enlarged abdominal or pelvic lymph nodes. Reproductive: Uterus and bilateral adnexa are unremarkable within limitation of poor visualization due to streak artifact from left hip arthroplasty. Other: No significant abdominopelvic free fluid. No pneumoperitoneum. No abdominal wall hernia. Musculoskeletal: Left hip arthroplasty. Stimulating generator in the right posterior gluteal subcutaneous soft tissues with lead extending into the thoracic vertebral canal. Demineralization of  bone. Spondylosis. No acute osseous abnormality. IMPRESSION: 1. No acute traumatic injury of the chest, abdomen, or pelvis. 2. Large hiatal hernia/intrathoracic stomach. 3.  Aortic Atherosclerosis (ICD10-I70.0). Electronically Signed   By: Dahlia Bailiff M.D.   On: 11/07/2021 14:49   DG Knee Complete 4 Views Right  Result Date: 11/07/2021 CLINICAL DATA:  RIGHT knee pain post fall EXAM: RIGHT KNEE - COMPLETE 4+ VIEW COMPARISON:  None FINDINGS: Osseous demineralization. Patellofemoral joint space narrowing. Chondrocalcinosis question CPPD. No acute fracture, dislocation, or bone destruction. No joint effusion. IMPRESSION: Osseous demineralization with degenerative changes and question CPPD RIGHT knee. No acute osseous abnormalities. Electronically Signed   By: Lavonia Dana M.D.   On: 11/07/2021 13:41    Procedures Procedures    Medications Ordered in ED Medications  fentaNYL (SUBLIMAZE) injection 50 mcg (50 mcg Intravenous Given 11/07/21 1255)  ondansetron (ZOFRAN) injection 4 mg (4 mg Intravenous Given 11/07/21 1258)  iohexol (OMNIPAQUE) 300 MG/ML solution 100 mL (100 mLs Intravenous Contrast Given 11/07/21 1413)  oxyCODONE-acetaminophen (PERCOCET/ROXICET) 5-325 MG per tablet 1 tablet (1 tablet Oral Given 11/07/21 1506)    ED Course/ Medical Decision Making/ A&P                           Medical Decision Making Amount and/or Complexity of Data Reviewed Labs: ordered. Radiology: ordered.  Risk Prescription drug management.  This patient presents to the ED for concern of fall, this involves an extensive number of treatment options, and is a complaint that carries with it a high risk of complications and morbidity.  The differential diagnosis includes tbi, ich, hip fx, rib fx   Comorbidities that complicate the patient evaluation: Patients presentation is complicated by their history of dementia, htn, hld, ckd, cad, thyroid disease, foot drop  Additional history obtained: Additional  history obtained from family Records reviewed Care Everywhere/External Records  Lab Tests: I Ordered, and personally interpreted labs.  The pertinent results include:   CBC wnl  CMP unremarkable UA with hematuria, otherwise negative - can continue maintenance abx   Imaging Studies ordered: I ordered imaging studies including CT scan , Xrays   I independently visualized and interpreted imaging which showed  CT head - neg for traumatic injury Xray ribs/chest - neg for rib fx Xray knee left - neg for fx, changes of CPPD CT chest/abd/pelvis - 1. No acute traumatic injury of the chest, abdomen, or pelvis. 2. Large hiatal hernia/intrathoracic stomach. 3.  Aortic Atherosclerosis  I agree with the radiologist interpretation  Cardiac Monitoring: The patient was maintained on a cardiac monitor.  I personally viewed and interpreted the cardiac monitor which showed an underlying rhythm of:  sinus rhythm  Medicines ordered and prescription drug management: I ordered medication including fentanyl, ivf, morphine  for pain  Reevaluation of the patient after these medicines showed that the patient    improved  Critical Interventions:  Incentive spirometer, pain meds  Reevaluation: After the interventions noted above, I reevaluated the patient and found that they have :improved  Complexity of problems addressed: Patients presentation is most consistent with  acute presentation with potential threat to life or bodily function  Disposition: After consideration of the diagnostic results and the patients response to treatment,  I feel that the patent would benefit from discharge With close follow-up with PCP.  Patient does not have any evidence of rib fracture on her x-ray or CT.  She also has no evidence of intra-abdominal injury.  Her pain was better controlled here in the ED and she was able to ambulate with sats maintained at 91 to 93% on room air.  Suspect that her brief episode of hypoxia  earlier was likely related to narcotic administration.  She was given an incentive spirometer to help event pneumonia.  She is advised to follow-up closely with PCP and is given Rx for pain medications for home.  She is advised return to the ED for any new or worsening symptoms in the meantime.  Her daughter and patient voiced understanding the plan and reasons to return.  All questions answered.  Patient stable for discharge. .    Final Clinical Impression(s) / ED Diagnoses Final diagnoses:  Fall, initial encounter  Chest wall pain    Rx / DC Orders ED Discharge Orders          Ordered    oxyCODONE-acetaminophen (PERCOCET/ROXICET) 5-325 MG tablet  Every 6 hours PRN        11/07/21 1704    lidocaine (LIDODERM) 5 %  Every 24 hours        11/07/21 1704              Sharifah Champine S, PA-C 11/07/21 [place], [place], DO 11/10/21 1557

## 2021-11-07 NOTE — ED Triage Notes (Signed)
Got up during night to go to bathroom and fell  c/o left side pain radiating into back  also vague c/o bladder arm and knee pain  also is being treated for uti and states is not getting any better

## 2021-11-07 NOTE — Discharge Instructions (Signed)
Please follow up with your urologist in regard to the blood in your urine.   Take the pain medication as directed. This medication may make you constipated so you should take a stool softener with it.   Please follow up with your primary care provider within 5-7 days for re-evaluation of your symptoms. If you do not have a primary care provider, information for a healthcare clinic has been provided for you to make arrangements for follow up care. Please return to the emergency department for any new or worsening symptoms.

## 2021-12-04 ENCOUNTER — Telehealth: Payer: Self-pay | Admitting: Neurology

## 2021-12-04 NOTE — Telephone Encounter (Signed)
Pt's daughter is asking for a call from RN to discuss Increase in worsening behavior & more confused states ?

## 2021-12-04 NOTE — Telephone Encounter (Signed)
Last visit was 04/10/21 with SS, NP per note from Dr. Terrace Arabia on 09/01/21 ?Levert Feinstein, MD ?Note ?Multiple phone calls following last visit in July 22, if she is not getting any better, may consider early follow-up with nurse practitioner  ?  ? ?I called and spoke with the daughter, she sts  ?pts confusion is worse and dose not improve throughout the day. She also exhibits anxiousness and irritability. Daughter was agreeable to f/u visit and appt has been made with Dr. Terrace Arabia on 01/12/22 at 1 pm. No sooner appt was available at the time of the call with NP.  ?

## 2021-12-18 NOTE — Telephone Encounter (Signed)
Pt's daughter, Beverely Pace LVM 3:03 pm: pt has dementia. Things just are not right. Know she needs to come in. I think she has appt 01/12/22. She is sleeping all the time, constantly have headaches. She very so much forgetful. The only one here taking care of her. I'm scared to death, do not know what to do. Please give me a call back ?

## 2021-12-18 NOTE — Telephone Encounter (Signed)
I spoke to the patient's daughter on [place]. She would like an earlier appt. Maralyn Sago did not have any availability. She has been scheduled to see Dr. Terrace Arabia on 12/30/21. The patient will have both daughters with her at this appt. They would like to discuss a long term plan. ?

## 2021-12-30 ENCOUNTER — Ambulatory Visit: Payer: Medicare Other | Admitting: Neurology

## 2021-12-30 ENCOUNTER — Encounter: Payer: Self-pay | Admitting: Neurology

## 2022-01-12 ENCOUNTER — Ambulatory Visit: Payer: Medicare Other | Admitting: Neurology

## 2022-01-15 ENCOUNTER — Ambulatory Visit: Payer: Medicare Other | Admitting: Neurology

## 2022-01-15 ENCOUNTER — Encounter: Payer: Self-pay | Admitting: Neurology

## 2022-01-15 ENCOUNTER — Other Ambulatory Visit: Payer: Self-pay | Admitting: Neurology

## 2022-01-15 VITALS — BP 108/65 | HR 74 | Ht 64.0 in | Wt 137.5 lb

## 2022-01-15 DIAGNOSIS — F03918 Unspecified dementia, unspecified severity, with other behavioral disturbance: Secondary | ICD-10-CM

## 2022-01-15 DIAGNOSIS — M21372 Foot drop, left foot: Secondary | ICD-10-CM | POA: Diagnosis not present

## 2022-01-15 DIAGNOSIS — R269 Unspecified abnormalities of gait and mobility: Secondary | ICD-10-CM | POA: Diagnosis not present

## 2022-01-15 NOTE — Progress Notes (Signed)
? ? ?ASSESSMENT AND PLAN ?81 y.o. year old female   ? ?Dementia with agitation ? CT head without contrast in February 2023 showed generalized atrophy, small vessel disease, not MRI candidate due to spinal stimulator ? Doing very well at Abilify 2 mg every morning, Seroquel 25 mg every night, along with Zoloft 200 mg daily ? MoCA 13/30 today, her dementia likely due to combination of central nervous system degenerative disorder, vascular component, underlying mood disorder ? ?  ?Gait abnormality, left foot drop, at risk for fall ? From residual deficit previous left lumbar radiculopathy, ? Encourage her wearing left AFO, ? Referred to outpatient physical therapy ? ?Return to clinic in 1 year with nurse practitioner  ? ? ? ?DIAGNOSTIC DATA (LABS, IMAGING, TESTING) ?- I reviewed patient records, labs, notes, testing and imaging myself where available. ?CT head without contrast November 07, 2021, small vessel disease, no acute abnormality, mild generalized atrophy ? ?CT abdomen chest, large hiatal hernia/intrathoracic stomach, aortic atherosclerosis ? ? ?History of present illness: ?[name] is a 81 year old female, seen in request by her primary care physician Dr. Andrey Campanile, Merlyn Albert for evaluation of sudden onset dizziness, gait abnormality, she is accompanied by her daughter [name], granddaughter [name] are at today's clinical visit.  Initial evaluation was on May 18, 2018. ?  ?She had a past medical history of hyperlipidemia, hypertension, depression, her husband passed away in Jan 03, 2018she now lives with her granddaughter, she had a history of left lumbar radiculopathy, status decompression surgery in the past, with residual left foot drop, gait abnormality, ?  ?On May 11, 2018, she woke up early morning around 6 AM using bathroom, noticed unsteady gait, vertigo, room was spinning, nauseous, she was able to manage to use the bathroom, lying back to sleep again, few hours later, when she tried to get up  again, symptoms still present, but had mild improvement, she has been symptomatic since his onset, few days later, when she went out shopping with her sister on May 14, 2018, she suddenly felt nauseous, dizziness, has to sit out waiting for her sister, ambulance was called, she was evaluated at emergency room, ?  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  ?She has been taking meclizine as needed which seems to help her symptoms some, she also complains of intermittent bilateral frontal headaches, ?  ?She has chronic low back pain, worsening gait abnormality, left foot drop, ?  ?UPDATE Jul 20 2018: ?Patient cannot have MRI of the brain, I personally reviewed CT head without contrast on July 16, 2018, periventricular small vessel disease no acute abnormality. ?  ?Echocardiogram on June 22, 2018 showed ejection fraction 55 to 60%, wall motion was normal, ?  ?Laboratory evaluation August 2019, negative troponin, CMP showed elevated glucose 117, showed hemoglobin of 11.7, ?  ?She continues to have left foot drop, unsteady gait ?  ?UPDATE Sep 17 2020: ?She is accompanied by her daughter at today's clinical visit, continue to have left foot drop, mild unsteady gait, wearing a left ankle brace, remain active at church, going out with her friends regularly, still driving, but daughter noticed that she has slow worsening memory loss, ?  ?Again personally reviewed CT scan without contrast in October 2019, no acute abnormality, mild supratentorium small vessel disease. ?  ?UPDATE January 14 2021: ?She is tearful during today's interview, slow worsening memory loss, significant  frustration, daughter reported sometimes she becomes so combative, hitting things, she became very frustrated, loses her medicine, lose her keys, blame her family members, which is a change from her baseline, she has always  been a capable sweet person ?  ?She fell steps, had acute CT head of the brain without contrast in February 2022, no acute abnormality, mild supratentorium small vessel disease ?  ?Laboratory evaluation in 2022 showed normal negative vitamin D, TSH, B12, lipid panel, CBC mild anemia hemoglobin of 11.4,, CMP, ?Falling, combative, hitting, loosing things, loosing her medication, keys, blame other come to house, sweet person, hard on her. ?  ?She was given a trial of Aricept in March 2022, did not help her, daughter reported seems to make things worse, now has stopped taking it ?  ?She was given Ativan 0.5 mg twice daily as needed for agitation, which seems to help, ? ?UPDATE January 15 2022: ?She lives with one of her daughters, is accompanied by [name] at today's visit, she has been doing very well over the past few months, taking Abilify 2 mg in the morning, Seroquel 25 mg every night, sleeps well, enjoying gardening work, attends church activity regularly, ? ?She continues to have occasional tearing spells, no longer has significant agitation, gait abnormality due to left foot drop, not consistent wearing her left AFO ? ?She fell on November 07, 2021, presented to emergency room, had a CT head, no acute abnormality, CT abdomen showed large hiatal hernia/intrathoracic stomach ? ?PHYSICAL EXAM ? ?Vitals:  ? 01/15/22 1030  ?BP: 108/65  ?Pulse: 74  ?Weight: 137 lb 8 oz (62.4 kg)  ?Height: 5\' 4"  (1.626 m)  ? ?Body mass index is 23.6 kg/m?. ? ?  01/15/2022  ? 10:35 AM 01/14/2021  ?  7:45 AM  ?Montreal Cognitive Assessment   ?Visuospatial/ Executive (0/5) 3 2  ?Naming (0/3) 3 3  ?Attention: Read list of digits (0/2) 0 1  ?Attention: Read list of letters (0/1) 0 1  ?Attention: Serial 7 subtraction starting at 100 (0/3) 1 1  ?Language: Repeat phrase (0/2) 2 2  ?Language : Fluency (0/1) 0 0  ?Abstraction (0/2) 0 1  ?Delayed Recall (0/5) 0 0  ?Orientation (0/6) 3 6  ?Total 12 17  ?Adjusted Score (based on education) 13 17  ?   ? ?Neurological examination  ?Mentation: Alert, tearful, anxious, most history is provided by her daughter, tearful during today's interview ? ?PHYSICAL EXAMNIATION: ? ?Gen: NAD, conversant, well nourised, well groomed                     ?Cardiovascular: Regular rate rhythm, no peripheral edema, warm, nontender. ?Eyes: Conjunctivae clear without exudates or hemorrhage ?Neck: Supple, no carotid bruits. ?Pulmonary: Clear to auscultation bilaterally  ? ?NEUROLOGICAL EXAM: ? ?MENTAL STATUS: ?Speech: ?   Speech is normal; fluent and spontaneous with normal comprehension.  ?Cognition: ?    Orientation to time, place and person ?    Normal recent and remote memory ?    Normal Attention span and concentration ?    Normal Language, naming, repeating,spontaneous speech ?    Fund of knowledge ?  ?CRANIAL NERVES: ?CN II: Visual fields are full to confrontation.  Pupils are round equal and briskly reactive to light. ?CN III, IV, VI: extraocular movement are normal. No ptosis. ?CN V: Facial sensation is intact to pinprick in all 3 divisions bilaterally. Corneal responses are intact.  ?CN VII: Face is symmetric with normal eye closure and smile. ?CN  VIII: Hearing is normal to casual conversation ?CN IX, X: Palate elevates symmetrically. Phonation is normal. ?CN XI: Head turning and shoulder shrug are intact  ? ?MOTOR: Profound left ankle dorsiflexion weakness ? ?REFLEXES: Hyperreflexia ?  ? ?COORDINATION: No truncal or limb dysmetria ? ?GAIT/STANCE: Need push-up to get up from seated position, left foot drop, unsteady ? ? ?REVIEW OF SYSTEMS: Out of a complete 14 system review of symptoms, the patient complains only of the following symptoms, and all other reviewed systems are negative. ? ?See HPI ? ?ALLERGIES: ?Allergies  ?Allergen Reactions  ? Nsaids Other (See Comments)  ?  GI upset  ? ? ?HOME MEDICATIONS: ?Outpatient Medications Prior to Visit  ?Medication Sig Dispense Refill  ? Acetaminophen (TYLENOL PO) Take by mouth.    ?  ARIPiprazole (ABILIFY) 2 MG tablet TAKE 1 TABLET BY MOUTH EVERY DAY 90 tablet 0  ? aspirin 81 MG tablet Take 81 mg by mouth daily.    ? Cholecalciferol (VITAMIN D) 125 MCG (5000 UT) CAPS Take 5,000 Units by

## 2022-01-28 ENCOUNTER — Telehealth: Payer: Self-pay | Admitting: Neurology

## 2022-01-28 MED ORDER — MEMANTINE HCL 10 MG PO TABS: 10.0000 mg | ORAL_TABLET | Freq: Two times a day (BID) | ORAL | 3 refills | Status: DC

## 2022-01-28 NOTE — Telephone Encounter (Signed)
Pt daughter called back. States she forgot to let us know pt needs a refill of memantine (NAMENDA) 10 MG tablet  at [facility] #[id number]. ?

## 2022-01-28 NOTE — Telephone Encounter (Signed)
Pt daughter concerningmemantine (NAMENDA) 10 MG tablet dosage. ? [facility] [phone] told pt daughter to give pt a different dosage than the prescription bottle.  ?Daughter would like a call back to clarify the dosage.  ? [phone]  ?

## 2022-01-28 NOTE — Telephone Encounter (Signed)
Refills sent to [facility].

## 2022-01-28 NOTE — Telephone Encounter (Signed)
I spoke the patient's daughter. She mistakenly has been giving the patient memantine 10mg , 1.5 tabs BID. She is unsure how this occurred. I had her check the prescription bottle. She confirmed it said 10mg , one tab BID. She will start giving it to her correctly.  ?

## 2022-02-02 ENCOUNTER — Other Ambulatory Visit: Payer: Self-pay | Admitting: Neurology

## 2022-02-04 ENCOUNTER — Other Ambulatory Visit: Payer: Self-pay | Admitting: Neurology

## 2022-03-18 ENCOUNTER — Telehealth: Payer: Self-pay | Admitting: Neurology

## 2022-03-18 NOTE — Telephone Encounter (Signed)
LVM at 10:06 am;Pt's daughter, [name] need get in touch with the nurse to find out something about her medication. Would like a call from the nurse.

## 2022-03-19 NOTE — Telephone Encounter (Signed)
Left message for a return call

## 2022-03-19 NOTE — Telephone Encounter (Signed)
Spoke to her daughter. Reports history of back surgery w/ [facility]. She is an established patient with that practice. She is having worsening pain. She is going to call to schedule an appt for an updated evaluation.

## 2022-03-19 NOTE — Telephone Encounter (Signed)
;  Pt's daughter, Beverely Pace is asking for a call back at [phone]

## 2022-03-21 ENCOUNTER — Other Ambulatory Visit: Payer: Self-pay | Admitting: Neurology

## 2022-04-26 IMAGING — DX DG KNEE COMPLETE 4+V*R*
4 series · 4 of 4 positions shown · non-contrast
Comparison: None

CLINICAL DATA: RIGHT knee pain post fall

EXAM:
RIGHT KNEE - COMPLETE 4+ VIEW

[knee ap]
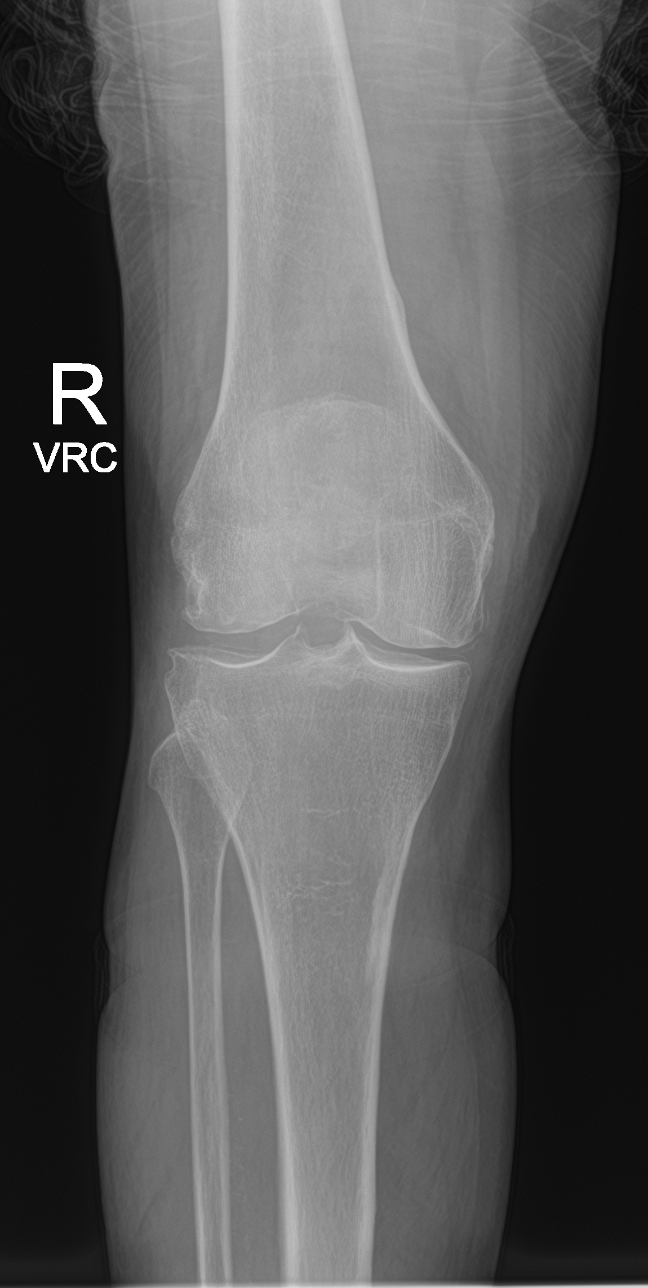

[knee lat]
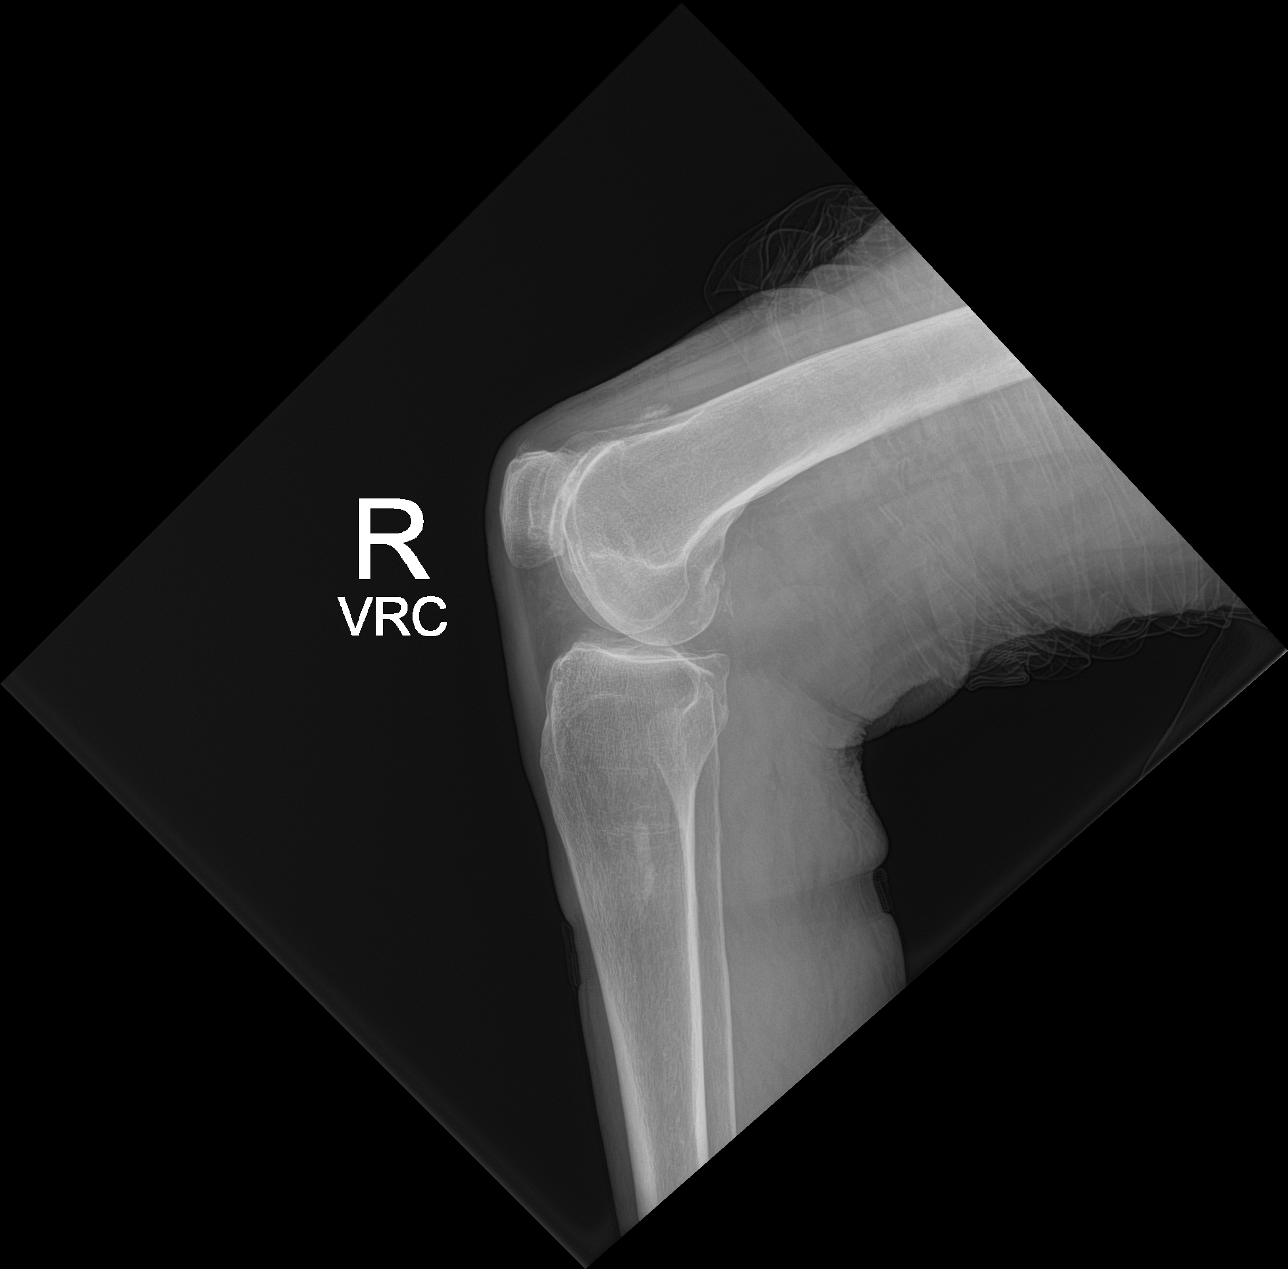

[knee obl (1 of 2)]
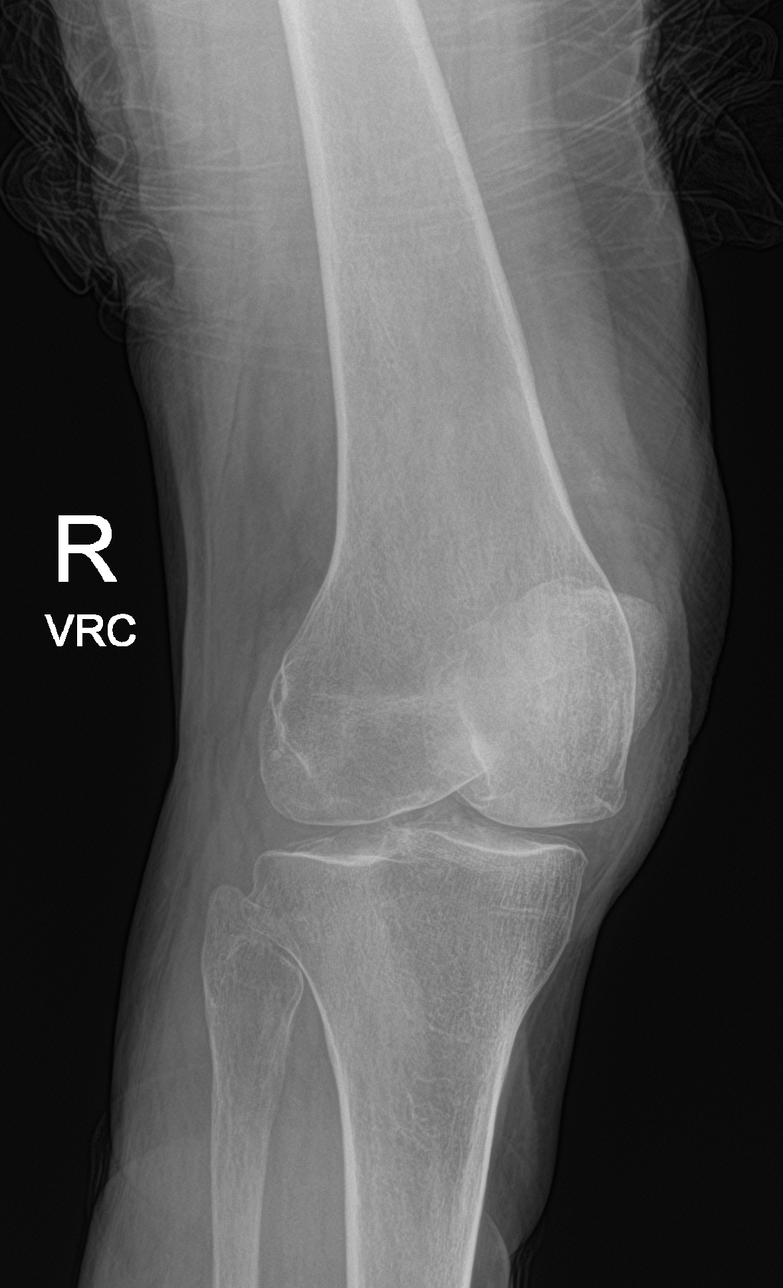

[knee obl (2 of 2)]
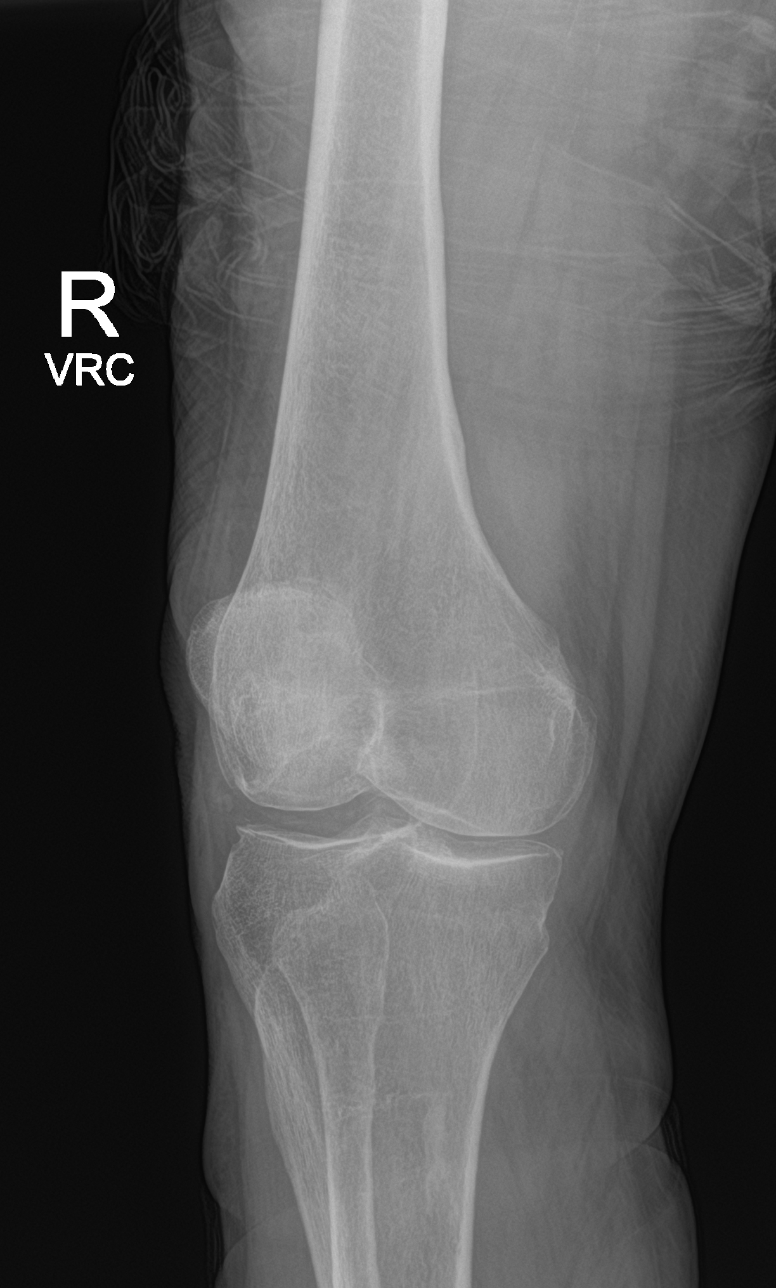

[4 of 4 positions shown; findings below may reference images not displayed]

FINDINGS: Osseous demineralization.

Patellofemoral joint space narrowing.

Chondrocalcinosis question CPPD.

No acute fracture, dislocation, or bone destruction.

No joint effusion.
IMPRESSION: Osseous demineralization with degenerative changes and question CPPD
RIGHT knee.

No acute osseous abnormalities.

## 2022-04-26 IMAGING — CT CT HEAD W/O CM
3 series · 16 of 47 positions shown, 19 images · non-contrast
Comparison: 11/15/2020

CLINICAL DATA: Fell during the night, LEFT side pain, moderate to
severe head trauma



[Series 2: head wo · axial · 0.42mm/px · z∈[-187,-57]mm · 10 of 32 slices shown, 13 images]
[im 3/32  brain]
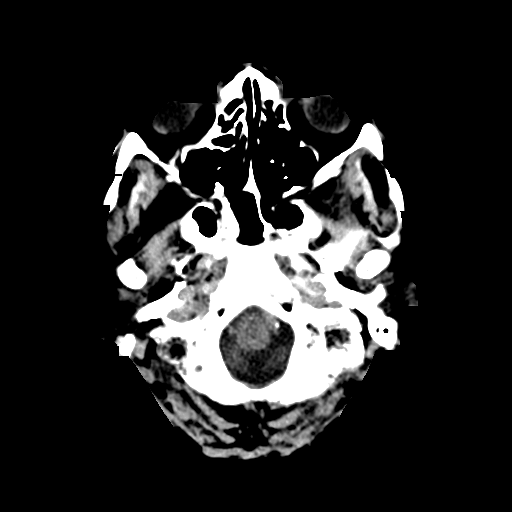
[im 3/32  bone]
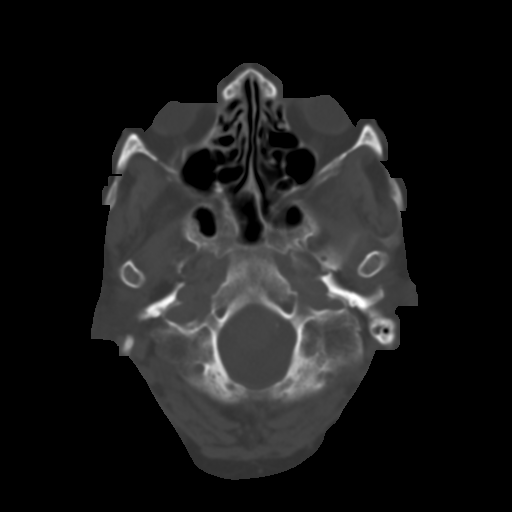
[im 6/32  brain]
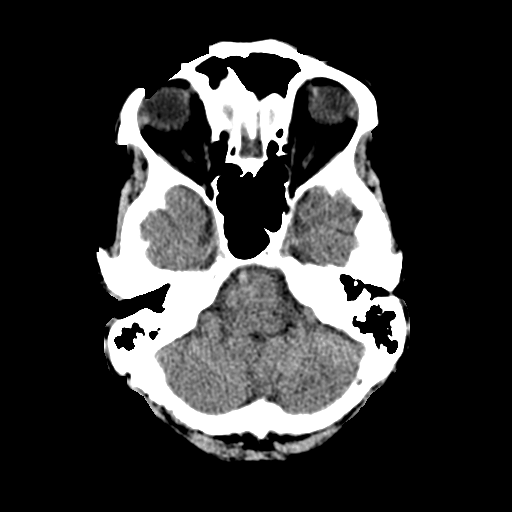
[im 9/32  brain]
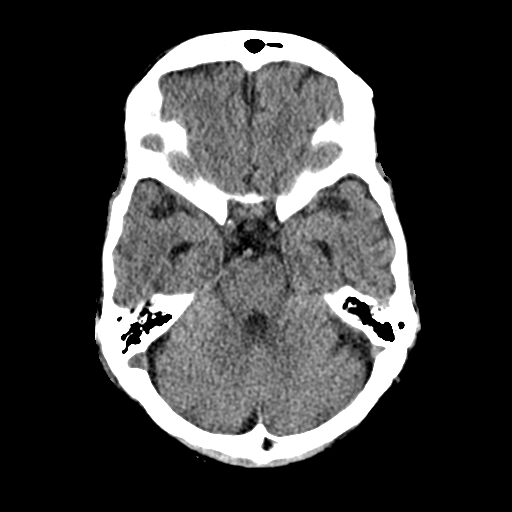
[im 11/32  brain]
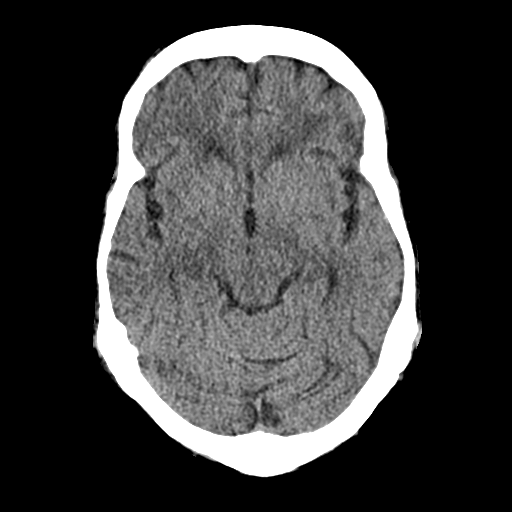
[im 14/32  brain]
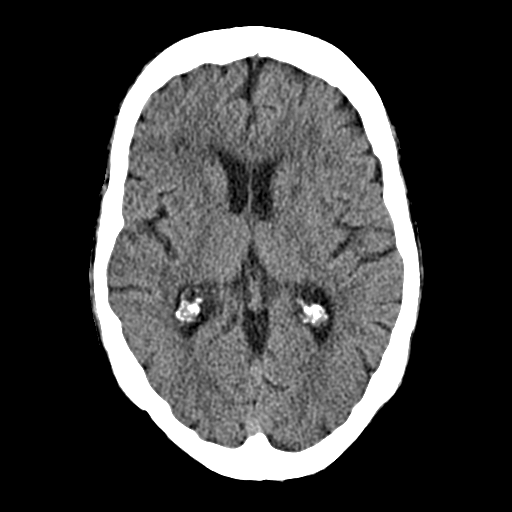
[im 14/32  bone]
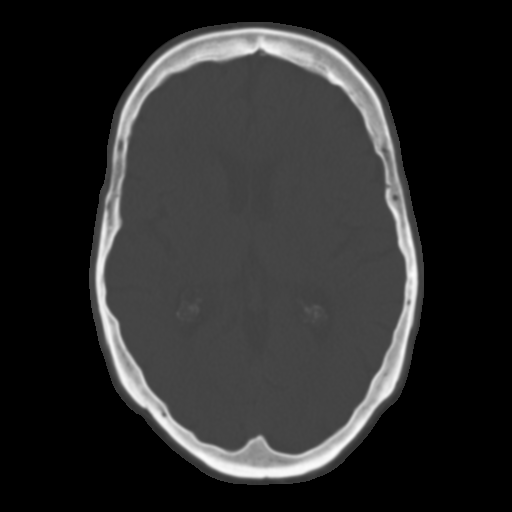
[im 18/32  brain]
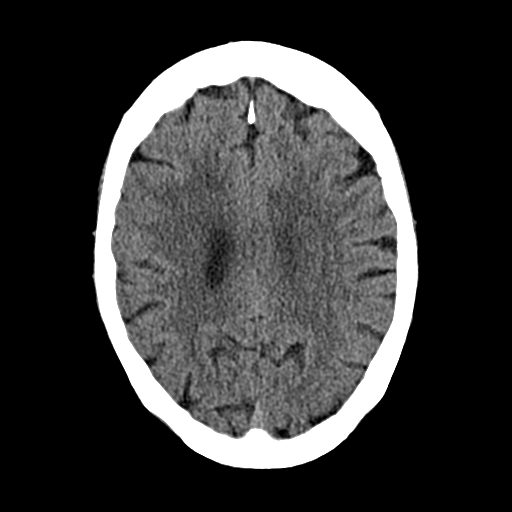
[im 21/32  brain]
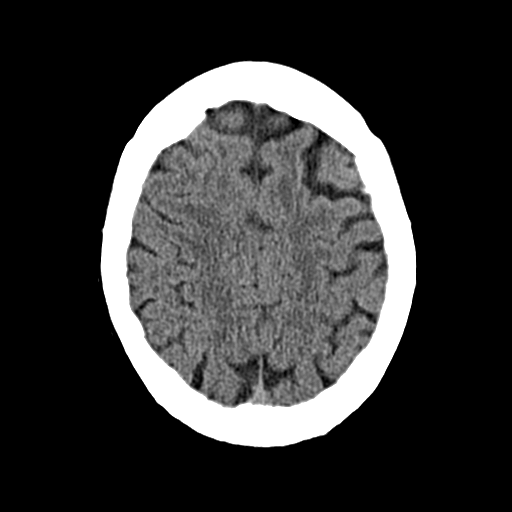
[im 24/32  brain]
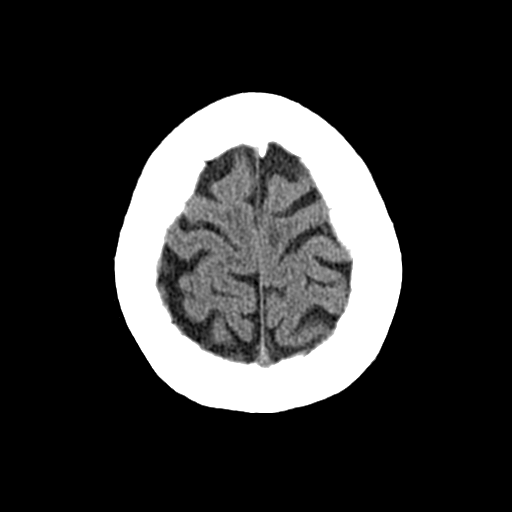
[im 26/32  brain]
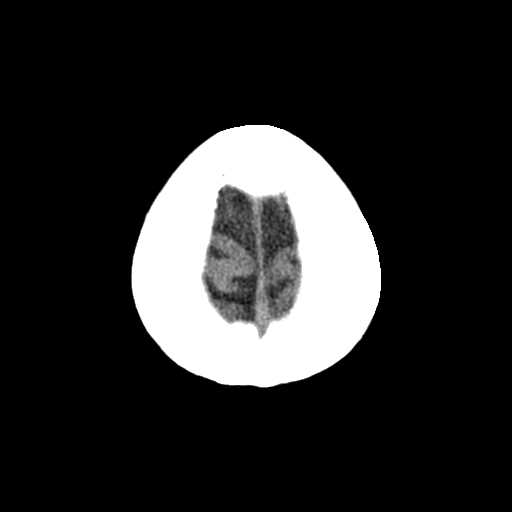
[im 26/32  bone]
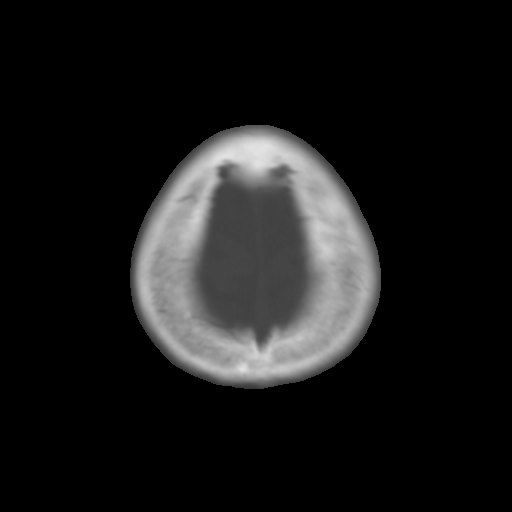
[im 29/32  brain]
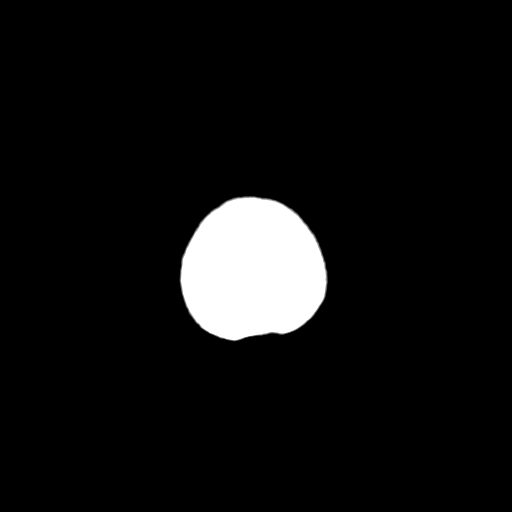

[Series 4: cor soft · coronal · 0.31mm/px · 3 of 67 slices shown]
[im 23/67  brain]
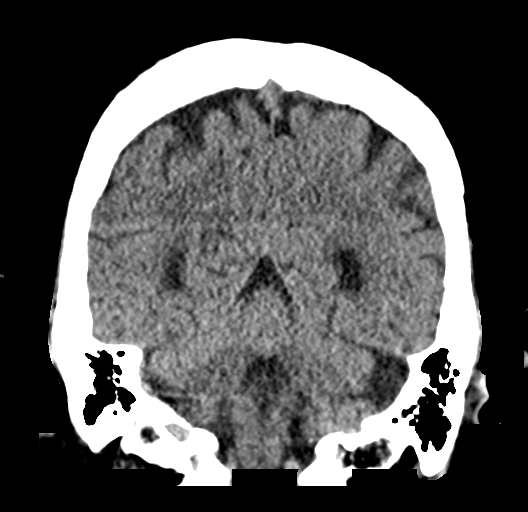
[im 30/67  brain]
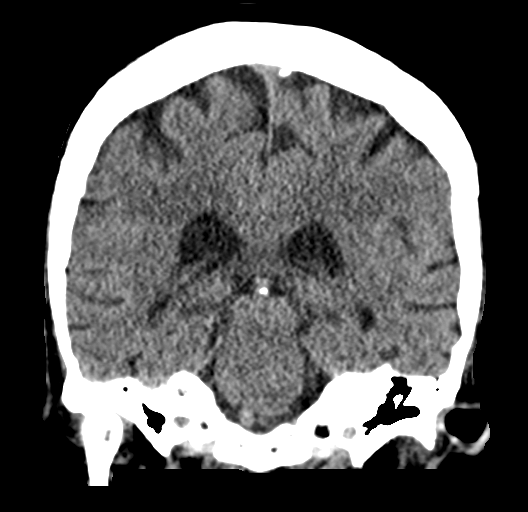
[im 37/67  brain]
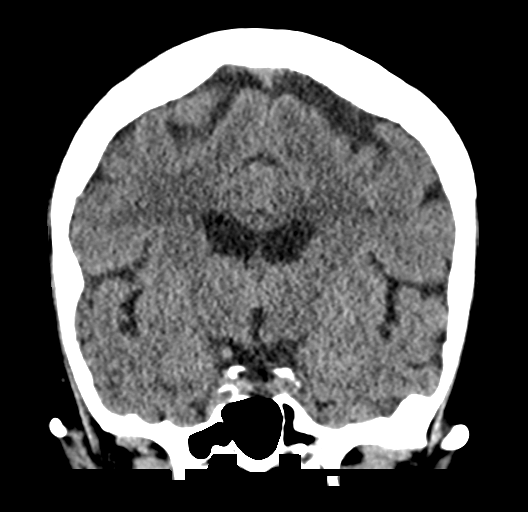

[Series 5: sag soft · sagittal · 0.32mm/px · 3 of 50 slices shown]
[im 17/50  brain]
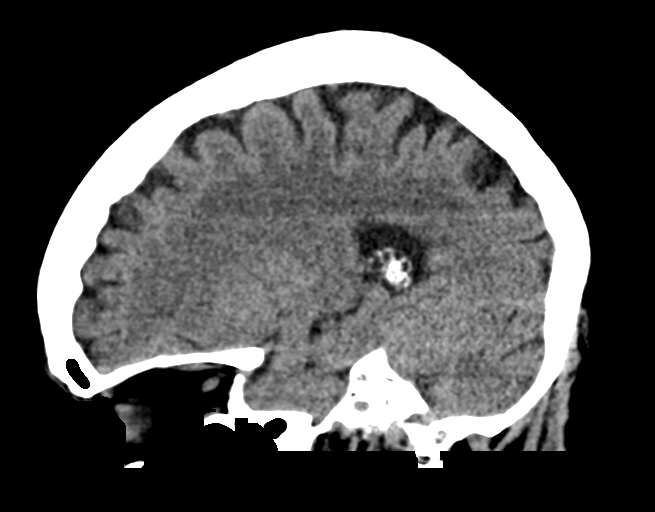
[im 25/50  brain]
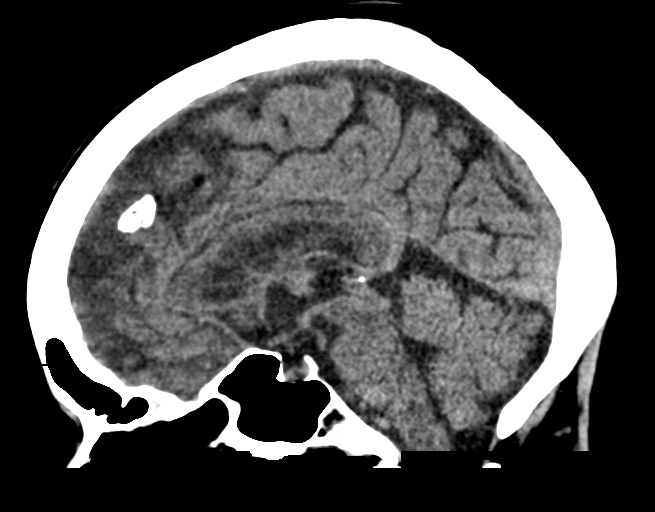
[im 33/50  brain]
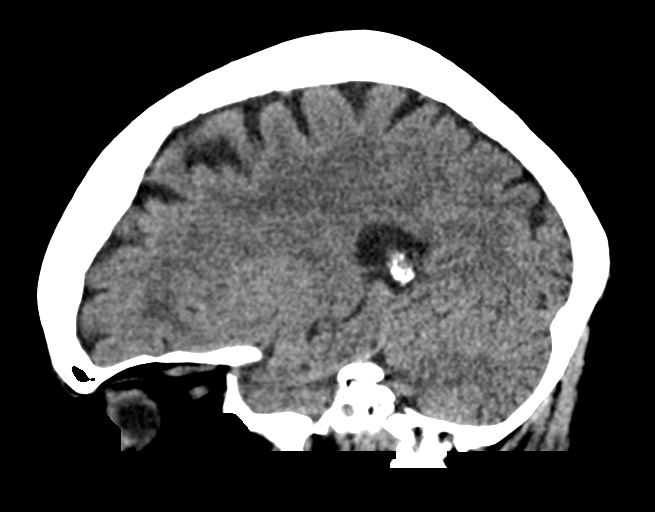

[16 of 47 positions shown; findings below may reference images not displayed]

FINDINGS: Brain: Generalized atrophy. Normal ventricular morphology. No
midline shift or mass effect. Small vessel chronic ischemic changes
of deep cerebral white matter. No intracranial hemorrhage, mass
lesion, evidence of acute infarction, or extra-axial fluid
collection.

Vascular: No hyperdense vessels

Skull: Demineralized but intact

Sinuses/Orbits: Clear

Other: N/A
IMPRESSION: Atrophy with small vessel chronic ischemic changes of deep cerebral
white matter.

No acute intracranial abnormalities.

## 2022-05-02 ENCOUNTER — Other Ambulatory Visit: Payer: Self-pay | Admitting: Neurology

## 2022-05-04 NOTE — Telephone Encounter (Signed)
Rx refilled.

## 2022-05-24 ENCOUNTER — Emergency Department (HOSPITAL_COMMUNITY)
Admission: EM | Admit: 2022-05-24 | Discharge: 2022-05-25 | Disposition: A | Discharge status: Home / Self Care | Payer: Medicare Other | Attending: Emergency Medicine | Admitting: Emergency Medicine

## 2022-05-24 ENCOUNTER — Encounter (HOSPITAL_COMMUNITY): Payer: Self-pay | Admitting: Emergency Medicine

## 2022-05-24 DIAGNOSIS — E039 Hypothyroidism, unspecified: Secondary | ICD-10-CM | POA: Diagnosis not present

## 2022-05-24 DIAGNOSIS — N189 Chronic kidney disease, unspecified: Secondary | ICD-10-CM | POA: Insufficient documentation

## 2022-05-24 DIAGNOSIS — I129 Hypertensive chronic kidney disease with stage 1 through stage 4 chronic kidney disease, or unspecified chronic kidney disease: Secondary | ICD-10-CM | POA: Insufficient documentation

## 2022-05-24 DIAGNOSIS — Z7982 Long term (current) use of aspirin: Secondary | ICD-10-CM | POA: Diagnosis not present

## 2022-05-24 DIAGNOSIS — M549 Dorsalgia, unspecified: Secondary | ICD-10-CM | POA: Diagnosis present

## 2022-05-24 DIAGNOSIS — Z79899 Other long term (current) drug therapy: Secondary | ICD-10-CM | POA: Insufficient documentation

## 2022-05-24 DIAGNOSIS — I2510 Atherosclerotic heart disease of native coronary artery without angina pectoris: Secondary | ICD-10-CM | POA: Insufficient documentation

## 2022-05-24 DIAGNOSIS — F0390 Unspecified dementia without behavioral disturbance: Secondary | ICD-10-CM | POA: Diagnosis not present

## 2022-05-24 DIAGNOSIS — G8929 Other chronic pain: Secondary | ICD-10-CM | POA: Diagnosis not present

## 2022-05-24 HISTORY — DX: Urinary tract infection, site not specified: N39.0

## 2022-05-24 NOTE — ED Triage Notes (Signed)
Pt BIB [facility] from home where she lives with family. Hx of chronic UTI and mild dementia. A&Ox4 at this time. Currently on Cipro x 1.5 weeks. She reports bilateral flank pain and painful urination all day today. Denies hematuria.   18g in L forearm CBG 126 Afebrile with EMS

## 2022-05-25 ENCOUNTER — Other Ambulatory Visit: Payer: Self-pay | Admitting: Neurology

## 2022-05-25 ENCOUNTER — Encounter (HOSPITAL_COMMUNITY): Payer: Self-pay | Admitting: Emergency Medicine

## 2022-05-25 LAB — WET PREP, GENITAL
Clue Cells Wet Prep HPF POC: NONE SEEN
Sperm: NONE SEEN
Trich, Wet Prep: NONE SEEN
WBC, Wet Prep HPF POC: <10 (ref <10)
Yeast Wet Prep HPF POC: NONE SEEN

## 2022-05-25 LAB — URINALYSIS, ROUTINE W REFLEX MICROSCOPIC
Bilirubin Urine: NEGATIVE
Glucose, UA: NEGATIVE mg/dL
Hgb urine dipstick: NEGATIVE
Ketones, ur: NEGATIVE mg/dL
Leukocytes,Ua: NEGATIVE
Nitrite: NEGATIVE
Protein, ur: NEGATIVE mg/dL
Specific Gravity, Urine: 1.025 (ref 1.005–1.030)
pH: 5 (ref 5.0–8.0)

## 2022-05-25 MED ORDER — TRAMADOL HCL 50 MG PO TABS
50.0000 mg | ORAL_TABLET | Freq: Once | ORAL | Status: AC
  Administered 2022-05-25: 50 mg via ORAL
  Filled 2022-05-25: qty 1

## 2022-05-25 MED ORDER — TRAMADOL HCL 50 MG PO TABS
50.0000 mg | ORAL_TABLET | Freq: Four times a day (QID) | ORAL | 0 refills | Status: DC | PRN
Start: 2022-05-25 — End: 2022-05-27

## 2022-05-25 NOTE — ED Notes (Signed)
Urine specimen/Culture obtained, placed at bedside, awaiting order.

## 2022-05-25 NOTE — ED Provider Notes (Signed)
[facility] Provider Note: Jocelyn Dell, MD, FACEP  CSN: [id number] MRN: [id number] ARRIVAL: 05/24/22 at 2305 ROOM: WA24/[facility]24   CHIEF COMPLAINT  Flank Pain  Level 5 caveat: Dementia; daughter is primary historian HISTORY OF PRESENT ILLNESS  05/25/22 12:24 AM [name] is a 81 y.o. female with a history of chronic back pain, frequent urinary tract infections and dementia.  She has been on ciprofloxacin for the past 1-1/2 weeks.  She is here with bilateral flank pain and painful urination throughout the day yesterday.  She has not had a fever.  She rates her pain as a 7 out of 10.   Her daughter states the pain in her flanks (actually her back) is in the location of her chronic pain.  She is essentially bedbound because of this.  She has not had any new falls or new injury.   Past Medical History:  Diagnosis Date   Arthritis    Chest pain    Chronic back pain    "neurostimulator electrode leads overlie the lower thoracic spinal canal" - per cxr report 07/10/14   Chronic kidney disease    kidney stone   Chronic UTI (urinary tract infection)    Coronary artery disease    NONOBSTRUCTIVE   Depression    Dizziness    GERD (gastroesophageal reflux disease)    Headache    History of kidney stones    Hyperlipidemia    Hypertension    Left foot drop    SINCE 2009 - RELATED TO BACK PROBLEM - WEARS BRACE   PONV (postoperative nausea and vomiting)    Spinal stenosis    Thyroid disease    hypothryoid    Past Surgical History:  Procedure Laterality Date   BACK SURGERY     CARDIAC CATHETERIZATION  02/22/2009   EF 60%   CATARACT EXTRACTION     both eyes   COLONOSCOPY     CYSTOSCOPY     FOR KIDNEY STONE   LUMBAR LAMINECTOMY/DECOMPRESSION MICRODISCECTOMY     TOTAL HIP ARTHROPLASTY Left 07/17/2014   Procedure: LEFT TOTAL HIP ARTHROPLASTY ANTERIOR APPROACH;  Surgeon: Shelda Pal, MD;  Location: [facility];  Service: Orthopedics;  Laterality: Left;    Family History   Problem Relation Age of Onset   Cancer Mother        started in gallbladder then spread to liver   Heart attack Father    Colon cancer Neg Hx    Esophageal cancer Neg Hx    Rectal cancer Neg Hx    Stomach cancer Neg Hx     Social History   Tobacco Use   Smoking status: Never   Smokeless tobacco: Never  Vaping Use   Vaping Use: Never used  Substance Use Topics   Alcohol use: No    Alcohol/week: 0.0 standard drinks of alcohol   Drug use: No    Prior to Admission medications   Medication Sig Start Date End Date Taking? Authorizing Provider  traMADol (ULTRAM) 50 MG tablet Take 1 tablet (50 mg total) by mouth every 6 (six) hours as needed (for pain). 05/25/22  Yes Jaran Sainz, MD  Acetaminophen (TYLENOL PO) Take by mouth.    [provider]  ARIPiprazole (ABILIFY) 2 MG tablet TAKE 1 TABLET BY MOUTH EVERY DAY 03/26/22   Levert Feinstein, MD  aspirin 81 MG tablet Take 81 mg by mouth daily.    [provider]  Cholecalciferol (VITAMIN D) 125 MCG (5000 UT) CAPS Take 5,000  Units by mouth daily.    [provider]  ciprofloxacin (CIPRO) 250 MG tablet Take 250 mg by mouth 2 (two) times daily. 05/20/22   [provider]  dimenhyDRINATE (DRAMAMINE PO) Take 1 tablet by mouth as needed.    [provider]  doxazosin (CARDURA) 8 MG tablet Take 8 mg by mouth every morning.    [provider]  fluticasone (FLONASE) 50 MCG/ACT nasal spray Place 1 spray into both nostrils daily as needed for allergies or rhinitis.    [provider]  IRON PO Take 325 mg by mouth daily.    [provider]  isosorbide mononitrate (IMDUR) 60 MG 24 hr tablet Take 60 mg by mouth every morning.    [provider]  levothyroxine (SYNTHROID) 75 MCG tablet Take 75 mcg by mouth daily. 12/18/20   [provider]  lidocaine (LIDODERM) 5 % Place 1 patch onto the skin daily. Remove & Discard patch within 12 hours or as directed by MD 11/07/21    Couture, Cortni S, PA-C  LORazepam (ATIVAN) 1 MG tablet Take 1 mg by mouth every 8 (eight) hours as needed for anxiety. 01/20/21   [provider]  LORazepam (ATIVAN) 2 MG tablet SMARTSIG:0.5 pill By Mouth 3 Times a Week 05/11/22   [provider]  memantine (NAMENDA) 10 MG tablet TAKE 1 TABLET BY MOUTH TWICE A DAY 05/04/22   Levert Feinstein, MD  Multiple Vitamin (MULTIVITAMIN) tablet Take 1 tablet by mouth every morning.    [provider]  nitrofurantoin (MACRODANTIN) 100 MG capsule Take 100 mg by mouth daily. 05/12/22   [provider]  QUEtiapine (SEROQUEL) 25 MG tablet TAKE 1 TABLET BY MOUTH EVERYDAY AT BEDTIME 08/27/21   Levert Feinstein, MD  sertraline (ZOLOFT) 100 MG tablet Take 200 mg by mouth every morning.    [provider]  simvastatin (ZOCOR) 40 MG tablet Take 40 mg by mouth at bedtime. 10/31/20   [provider]    Allergies Nsaids   REVIEW OF SYSTEMS  Level 5 caveat   PHYSICAL EXAMINATION  Initial Vital Signs Blood pressure (!) 150/77, pulse 60, temperature 98.1 F (36.7 C), temperature source Oral, resp. rate 13, SpO2 98 %.  Examination General: Well-developed, well-nourished female in no acute distress; appearance consistent with age of record HENT: normocephalic; atraumatic Eyes: Normal appearance Neck: supple Heart: regular rate and rhythm Lungs: clear to auscultation bilaterally Abdomen: soft; nondistended; nontender; bowel sounds present Back: Kyphotic; nontender; no pain on movement GU: No CVA tenderness; mild intertrigo of labia majora Extremities: Arthritic changes Neurologic: Awake, alert; motor function intact in all extremities and symmetric; no facial droop Skin: Warm and dry Psychiatric: Normal mood and affect   RESULTS  Summary of this visit's results, reviewed and interpreted by myself:   EKG Interpretation  Date/Time:    Ventricular Rate:    PR Interval:    QRS Duration:   QT Interval:    QTC  Calculation:   R Axis:     Text Interpretation:         Laboratory Studies: Results for orders placed or performed during the hospital encounter of 05/24/22 (from the past 24 hour(s))  Urinalysis, Routine w reflex microscopic Urine, Clean Catch     Status: Abnormal   Collection Time: 05/25/22 12:24 AM  Result Value Ref Range   Color, Urine YELLOW YELLOW   APPearance HAZY (A) CLEAR   Specific Gravity, Urine 1.025 1.005 - 1.030   pH 5.0 5.0 -  8.0   Glucose, UA NEGATIVE NEGATIVE mg/dL   Hgb urine dipstick NEGATIVE NEGATIVE   Bilirubin Urine NEGATIVE NEGATIVE   Ketones, ur NEGATIVE NEGATIVE mg/dL   Protein, ur NEGATIVE NEGATIVE mg/dL   Nitrite NEGATIVE NEGATIVE   Leukocytes,Ua NEGATIVE NEGATIVE  Wet prep, genital     Status: None   Collection Time: 05/25/22  1:03 AM   Specimen: Vaginal  Result Value Ref Range   Yeast Wet Prep HPF POC NONE SEEN NONE SEEN   Trich, Wet Prep NONE SEEN NONE SEEN   Clue Cells Wet Prep HPF POC NONE SEEN NONE SEEN   WBC, Wet Prep HPF POC <10 <10   Sperm NONE SEEN    Imaging Studies: No results found.  ED COURSE and MDM  Nursing notes, initial and subsequent vitals signs, including pulse oximetry, reviewed and interpreted by myself.  Vitals:   05/24/22 2322 05/25/22 0000 05/25/22 0030 05/25/22 0100  BP: (!) 150/77 138/68 (!) 146/84 (!) 169/77  Pulse: 60 (!) 54 (!) 51 (!) 54  Resp: 13 18 18 18   Temp: 98.1 F (36.7 C)     TempSrc: Oral     SpO2: 98% 95% 97% 98%   Medications  traMADol (ULTRAM) tablet 50 mg (has no administration in time range)   No evidence of urinary tract infection or candidal vulvovaginitis.  Although she has some mild intertrigo of the labia majora she experienced no tenderness on exam and states that she does not have burning on urination just increased frequency of urination.  The back pain appears to be an exacerbation of her known chronic pain.  She is not currently on any analgesics and we will start her on tramadol  which she has tolerated in the past.   PROCEDURES  Procedures   ED DIAGNOSES     ICD-10-CM   1. Exacerbation of chronic back pain  M54.9    G89.29          Kamden Stanislaw, , MD 05/25/22 [phone]

## 2022-05-26 LAB — URINE CULTURE: Culture: NO GROWTH

## 2022-05-27 ENCOUNTER — Emergency Department (HOSPITAL_BASED_OUTPATIENT_CLINIC_OR_DEPARTMENT_OTHER): Payer: Medicare Other | Admitting: Radiology

## 2022-05-27 ENCOUNTER — Emergency Department (HOSPITAL_BASED_OUTPATIENT_CLINIC_OR_DEPARTMENT_OTHER): Payer: Medicare Other

## 2022-05-27 ENCOUNTER — Emergency Department (HOSPITAL_BASED_OUTPATIENT_CLINIC_OR_DEPARTMENT_OTHER)
Admission: EM | Admit: 2022-05-27 | Discharge: 2022-05-27 | Disposition: A | Discharge status: Home / Self Care | Payer: Medicare Other | Attending: Emergency Medicine | Admitting: Emergency Medicine

## 2022-05-27 ENCOUNTER — Other Ambulatory Visit (HOSPITAL_BASED_OUTPATIENT_CLINIC_OR_DEPARTMENT_OTHER): Payer: Self-pay

## 2022-05-27 ENCOUNTER — Other Ambulatory Visit: Payer: Self-pay

## 2022-05-27 ENCOUNTER — Encounter (HOSPITAL_BASED_OUTPATIENT_CLINIC_OR_DEPARTMENT_OTHER): Payer: Self-pay

## 2022-05-27 DIAGNOSIS — W06XXXA Fall from bed, initial encounter: Secondary | ICD-10-CM | POA: Diagnosis not present

## 2022-05-27 DIAGNOSIS — R079 Chest pain, unspecified: Secondary | ICD-10-CM | POA: Diagnosis not present

## 2022-05-27 DIAGNOSIS — Z7982 Long term (current) use of aspirin: Secondary | ICD-10-CM | POA: Insufficient documentation

## 2022-05-27 DIAGNOSIS — S3992XA Unspecified injury of lower back, initial encounter: Secondary | ICD-10-CM | POA: Diagnosis present

## 2022-05-27 DIAGNOSIS — S39012A Strain of muscle, fascia and tendon of lower back, initial encounter: Secondary | ICD-10-CM | POA: Diagnosis not present

## 2022-05-27 DIAGNOSIS — K449 Diaphragmatic hernia without obstruction or gangrene: Secondary | ICD-10-CM | POA: Diagnosis not present

## 2022-05-27 DIAGNOSIS — M5432 Sciatica, left side: Secondary | ICD-10-CM

## 2022-05-27 LAB — CBC WITH DIFFERENTIAL/PLATELET
Abs Immature Granulocytes: 0.01 10*3/uL (ref 0.00–0.07)
Basophils Absolute: 0 10*3/uL (ref 0.0–0.1)
Basophils Relative: 0 %
Eosinophils Absolute: 0.1 10*3/uL (ref 0.0–0.5)
Eosinophils Relative: 2 %
HCT: 40.9 % (ref 36.0–46.0)
Hemoglobin: 13.6 g/dL (ref 12.0–15.0)
Immature Granulocytes: 0 %
Lymphocytes Relative: 24 %
Lymphs Abs: 1.6 10*3/uL (ref 0.7–4.0)
MCH: 32 pg (ref 26.0–34.0)
MCHC: 33.3 g/dL (ref 30.0–36.0)
MCV: 96.2 fL (ref 80.0–100.0)
Monocytes Absolute: 0.4 10*3/uL (ref 0.1–1.0)
Monocytes Relative: 6 %
Neutro Abs: 4.3 10*3/uL (ref 1.7–7.7)
Neutrophils Relative %: 68 %
Platelets: 161 10*3/uL (ref 150–400)
RBC: 4.25 MIL/uL (ref 3.87–5.11)
RDW: 12 % (ref 11.5–15.5)
WBC: 6.4 10*3/uL (ref 4.0–10.5)
nRBC: 0 % (ref 0.0–0.2)

## 2022-05-27 LAB — BASIC METABOLIC PANEL
Anion gap: 10 (ref 5–15)
BUN: 16 mg/dL (ref 8–23)
CO2: 27 mmol/L (ref 22–32)
Calcium: 9.8 mg/dL (ref 8.9–10.3)
Chloride: 102 mmol/L (ref 98–111)
Creatinine, Ser: 0.71 mg/dL (ref 0.44–1.00)
GFR, Estimated: >60 mL/min (ref >60)
Glucose, Bld: 87 mg/dL (ref 70–99)
Potassium: 3.6 mmol/L (ref 3.5–5.1)
Sodium: 139 mmol/L (ref 135–145)

## 2022-05-27 LAB — TROPONIN I (HIGH SENSITIVITY)
Troponin I (High Sensitivity): 4 ng/L (ref <18)
Troponin I (High Sensitivity): 3 ng/L (ref <18)

## 2022-05-27 MED ORDER — TRAMADOL HCL 50 MG PO TABS
50.0000 mg | ORAL_TABLET | Freq: Four times a day (QID) | ORAL | 0 refills | Status: DC | PRN
Start: 2022-05-27 — End: 2022-05-27
  Filled 2022-05-27: qty 8, 2d supply, fill #0

## 2022-05-27 MED ORDER — TRAMADOL HCL 50 MG PO TABS
50.0000 mg | ORAL_TABLET | Freq: Once | ORAL | Status: AC
  Administered 2022-05-27: 50 mg via ORAL
  Filled 2022-05-27: qty 1

## 2022-05-27 NOTE — ED Notes (Signed)
Pt wants to go home & daughter doesn't drive at night. 2nd trop should be drawn at 1820. They agreed to wait till 2nd trop drawn and resulted.

## 2022-05-27 NOTE — ED Provider Notes (Addendum)
[facility] EMERGENCY DEPT Provider Note   CSN: [id number] Arrival date & time: 05/27/22  1239     History  Chief Complaint  Patient presents with   Back Pain    [name] is a 81 y.o. female.  Patient with history of recurrent and chronic back pain, back surgery history presents with left lower back pain with intermittent rating down the left leg similar to her previous.  Patient has multiple times the past and has known foot drop.  Patient has specialist to follow-up with has an appointment.  Patient fell about a week ago however no other significant trauma.  Pain worse with position.  No abdominal pain, fevers, urinary symptoms.       Home Medications Prior to Admission medications   Medication Sig Start Date End Date Taking? Authorizing Provider  Acetaminophen (TYLENOL PO) Take by mouth.    [provider]  ARIPiprazole (ABILIFY) 2 MG tablet TAKE 1 TABLET BY MOUTH EVERY DAY 03/26/22   Levert Feinstein, MD  aspirin 81 MG tablet Take 81 mg by mouth daily.    [provider]  Cholecalciferol (VITAMIN D) 125 MCG (5000 UT) CAPS Take 5,000 Units by mouth daily.    [provider]  ciprofloxacin (CIPRO) 250 MG tablet Take 250 mg by mouth 2 (two) times daily. 05/20/22   [provider]  dimenhyDRINATE (DRAMAMINE PO) Take 1 tablet by mouth as needed.    [provider]  doxazosin (CARDURA) 8 MG tablet Take 8 mg by mouth every morning.    [provider]  fluticasone (FLONASE) 50 MCG/ACT nasal spray Place 1 spray into both nostrils daily as needed for allergies or rhinitis.    [provider]  IRON PO Take 325 mg by mouth daily.    [provider]  isosorbide mononitrate (IMDUR) 60 MG 24 hr tablet Take 60 mg by mouth every morning.    [provider]  levothyroxine (SYNTHROID) 75 MCG tablet Take 75 mcg by mouth daily. 12/18/20   [provider]  lidocaine (LIDODERM) 5 % Place 1 patch  onto the skin daily. Remove & Discard patch within 12 hours or as directed by MD 11/07/21   Couture, Cortni S, PA-C  LORazepam (ATIVAN) 1 MG tablet Take 1 mg by mouth every 8 (eight) hours as needed for anxiety. 01/20/21   [provider]  LORazepam (ATIVAN) 2 MG tablet SMARTSIG:0.5 pill By Mouth 3 Times a Week 05/11/22   [provider]  memantine (NAMENDA) 10 MG tablet TAKE 1 TABLET BY MOUTH TWICE A DAY 05/04/22   Levert Feinstein, MD  Multiple Vitamin (MULTIVITAMIN) tablet Take 1 tablet by mouth every morning.    [provider]  nitrofurantoin (MACRODANTIN) 100 MG capsule Take 100 mg by mouth daily. 05/12/22   [provider]  QUEtiapine (SEROQUEL) 25 MG tablet TAKE 1 TABLET BY MOUTH EVERYDAY AT BEDTIME 05/25/22   Levert Feinstein, MD  sertraline (ZOLOFT) 100 MG tablet Take 200 mg by mouth every morning.    [provider]  simvastatin (ZOCOR) 40 MG tablet Take 40 mg by mouth at bedtime. 10/31/20   [provider]      Allergies    Nsaids    Review of Systems   Review of Systems  Constitutional:  Negative for chills and fever.  HENT:  Negative for congestion.   Eyes:  Negative for visual disturbance.  Respiratory:  Negative for shortness of breath.   Cardiovascular:  Negative for chest  pain.  Gastrointestinal:  Negative for abdominal pain and vomiting.  Genitourinary:  Negative for difficulty urinating, dysuria and flank pain.  Musculoskeletal:  Positive for back pain. Negative for neck pain and neck stiffness.  Skin:  Negative for rash.  Neurological:  Negative for weakness, light-headedness and headaches.    Physical Exam Updated Vital Signs BP (!) 161/63   Pulse (!) 54   Temp 97.9 F (36.6 C)   Resp 16   Ht 5\' 4"  (1.626 m)   Wt 62.4 kg   SpO2 94%   BMI 23.61 kg/m  Physical Exam Vitals and nursing note reviewed.  Constitutional:      General: She is not in acute distress.    Appearance: She is well-developed.  HENT:     Head:  Normocephalic and atraumatic.     Mouth/Throat:     Mouth: Mucous membranes are moist.  Eyes:     General:        Right eye: No discharge.        Left eye: No discharge.     Conjunctiva/sclera: Conjunctivae normal.  Neck:     Trachea: No tracheal deviation.  Cardiovascular:     Rate and Rhythm: Normal rate.  Pulmonary:     Effort: Pulmonary effort is normal.  Abdominal:     General: There is no distension.     Palpations: Abdomen is soft.     Tenderness: There is no abdominal tenderness. There is no guarding.  Musculoskeletal:        General: Tenderness present. No swelling.     Cervical back: Normal range of motion and neck supple. No rigidity.     Comments: Patient has mild paraspinal and SI joint tenderness on the left and upper buttocks.  No midline lumbar spine tenderness.  Skin:    General: Skin is warm.     Capillary Refill: Capillary refill takes less than 2 seconds.     Findings: No rash.  Neurological:     General: No focal deficit present.     Mental Status: She is alert.     Cranial Nerves: No cranial nerve deficit.     Comments: Patient has normal strength with flexion extension at ankles knees and hips lower extremities except mild foot drop left ankle. Sensation intact to palpation in all major nerves.  2+ pulses distally in both legs and no swelling to the legs.  Psychiatric:        Mood and Affect: Mood normal.     ED Results / Procedures / Treatments   Labs (all labs ordered are listed, but only abnormal results are displayed) Labs Reviewed  BASIC METABOLIC PANEL  CBC WITH DIFFERENTIAL/PLATELET  TROPONIN I (HIGH SENSITIVITY)    EKG None  Radiology DG Lumbar Spine 2-3 Views  Result Date: 05/27/2022 CLINICAL DATA:  Fall, lumbar pain EXAM: LUMBAR SPINE - 2-3 VIEW COMPARISON:  None Available. FINDINGS: No fracture or dislocation of the lumbar spine. Focally severe disc space height loss and osteophytosis of L2-L3 with otherwise mild to moderate disc  space height loss. Severe facet degenerative change of the lower lumbar levels. Nonobstructive pattern of overlying bowel gas. Partially imaged left hip total arthroplasty. Right-sided thoracic epidural stimulator box and lead, partially imaged. IMPRESSION: 1.  No fracture or dislocation of the lumbar spine. 2. Focally severe disc space height loss and osteophytosis of L2-L3 with otherwise mild to moderate disc space height loss. Lumbar disc and neural foraminal pathology may be further evaluated by MRI if  indicated by neurologically localizing signs and symptoms. Electronically Signed   By: Jearld Lesch M.D.   On: 05/27/2022 13:19    Procedures Procedures    Medications Ordered in ED Medications  traMADol (ULTRAM) tablet 50 mg (50 mg Oral Given 05/27/22 1533)    ED Course/ Medical Decision Making/ A&P                           Medical Decision Making Amount and/or Complexity of Data Reviewed Labs: ordered. Radiology: ordered.  Risk Prescription drug management.   Patient with known lumbar disc/arthritis/surgery history presents with acute on chronic back pain with mild sciatica.  Fortunately patient has no new weakness, foot drop is not new.  No fevers or urinary symptoms to suggest pyelonephritis.  No abdominal pain to suggest other pathology there.  Discussed in detail with patient and patient's daughter they are comfortable with no further work-up aside from the x-ray which showed chronic findings and was reviewed.  Patient has follow-up with her surgeon/specialist.  Pain meds given in the ER and small prescription for home.  Discussed in detail risk of taking pain meds especially with few of her other medicines that can make her drowsy or slow her breathing.  Patient and daughter comfortable this plan.  Reviewed medical records 8/28 and her recent urinalysis that did not show signs of infection.  Prior to discharge patient developed transient anterior nonradiating chest discomfort that  resolved after 5 minutes.  EKG reviewed no acute ischemia.  Discussed plan for monitoring screening blood work and chest x-ray.  Patient and daughter okay with this and if unremarkable to follow-up closely outpatient.  Afternoon physician will follow-up results and reassess.      Final Clinical Impression(s) / ED Diagnoses Final diagnoses:  Sciatica of left side  Strain of lumbar region, initial encounter  Chest pain, unspecified type    Rx / DC Orders ED Discharge Orders          Ordered    traMADol (ULTRAM) 50 MG tablet  Every 6 hours PRN,   Status:  Discontinued        05/27/22 1544              Blane Ohara, MD 05/27/22 1550    Blane Ohara, MD 05/27/22 [phone]

## 2022-05-27 NOTE — ED Notes (Signed)
Pt discharged home after verbalizing understanding of discharge instructions; nad noted. 

## 2022-05-27 NOTE — ED Notes (Addendum)
Pt via pov from home with back and side pain, as well as tingling in her left foot, which pt states is similar to the sensation she had a few years ago when she had foot drop. Pt did fall out of bed about 1 week ago. Pt alert & oriented, nad noted.

## 2022-05-27 NOTE — ED Triage Notes (Signed)
Patient here POV from Home.  Endorses Falling OOB approximately 1 Week ago and injuring her Lower Back. Pain since and also radiating to Left Leg.  Seen at another ED a few days ago for Similar Symptoms.   NAD Noted during Triage. A&Ox4. GCS 15. BIB Wheelchair. Normally Ambulatory without assistance but has been using Cane recently.

## 2022-05-27 NOTE — Discharge Instructions (Addendum)
Follow-up with your specialist as discussed. For mild pain use Tylenol and ice as needed every 4 hours. For severe pain you can take tramadol however it can make you dizzy and do not take it with your benzodiazepine/anxiety meds or other strong pain meds as they can combine to decrease your breathing or make you confused. If you develop significant abdominal pain, passout, blood in the stools, fevers return to the ER if not follow-up with your primary doctor and specialist.

## 2022-05-27 NOTE — ED Notes (Signed)
Pt requested to be taken to the restroom. When she began to stand up, she felt pain in her left chest and stated she felt sob. MD notified.

## 2022-06-10 ENCOUNTER — Emergency Department (HOSPITAL_COMMUNITY): Payer: Medicare Other

## 2022-06-10 ENCOUNTER — Encounter (HOSPITAL_COMMUNITY): Payer: Self-pay

## 2022-06-10 ENCOUNTER — Other Ambulatory Visit: Payer: Self-pay

## 2022-06-10 ENCOUNTER — Emergency Department (HOSPITAL_COMMUNITY)
Admission: EM | Admit: 2022-06-10 | Discharge: 2022-06-11 | Disposition: A | Discharge status: Home / Self Care | Payer: Medicare Other | Attending: Emergency Medicine | Admitting: Emergency Medicine

## 2022-06-10 DIAGNOSIS — T424X5A Adverse effect of benzodiazepines, initial encounter: Secondary | ICD-10-CM | POA: Insufficient documentation

## 2022-06-10 DIAGNOSIS — Z7982 Long term (current) use of aspirin: Secondary | ICD-10-CM | POA: Insufficient documentation

## 2022-06-10 DIAGNOSIS — R4182 Altered mental status, unspecified: Secondary | ICD-10-CM | POA: Diagnosis present

## 2022-06-10 DIAGNOSIS — T50905A Adverse effect of unspecified drugs, medicaments and biological substances, initial encounter: Secondary | ICD-10-CM

## 2022-06-10 LAB — CBC
HCT: 35.8 % — ABNORMAL LOW (ref 36.0–46.0)
Hemoglobin: 11.8 g/dL — ABNORMAL LOW (ref 12.0–15.0)
MCH: 32.5 pg (ref 26.0–34.0)
MCHC: 33 g/dL (ref 30.0–36.0)
MCV: 98.6 fL (ref 80.0–100.0)
Platelets: 145 10*3/uL — ABNORMAL LOW (ref 150–400)
RBC: 3.63 MIL/uL — ABNORMAL LOW (ref 3.87–5.11)
RDW: 12 % (ref 11.5–15.5)
WBC: 5.6 10*3/uL (ref 4.0–10.5)
nRBC: 0 % (ref 0.0–0.2)

## 2022-06-10 MED ORDER — SODIUM CHLORIDE 0.9 % IV SOLN
1000.0000 mL | INTRAVENOUS | Status: DC
  Administered 2022-06-11: 1000 mL via INTRAVENOUS

## 2022-06-10 MED ORDER — SODIUM CHLORIDE 0.9 % IV BOLUS (SEPSIS)
500.0000 mL | Freq: Once | INTRAVENOUS | Status: AC
  Administered 2022-06-10: 500 mL via INTRAVENOUS

## 2022-06-10 NOTE — ED Provider Notes (Signed)
[facility]-EMERGENCY DEPT Provider Note   CSN: [id number] Arrival date & time: 06/10/22  2026     History  Chief Complaint  Patient presents with   Altered Mental Status   Fall    [name] is a 81 y.o. female.   Altered Mental Status Fall   Patient presented to the ED for evaluation of altered mental status.  Patient has history of lower back pain.  She was recently prescribed new medications tramadol.  She also has been taking benzodiazepines.  Family states that she fell earlier today.  She was taken to the PCP and she was discharged home.  Patient had a another fall today.  They also noted she was confused.  Patient did admit to taking more than one of her benzodiazepine today in addition to the tramadol.  Family noted she was more confused and somnolent.  She appeared drunk after those medications.  She does appear somewhat better now than earlier.    Home Medications Prior to Admission medications   Medication Sig Start Date End Date Taking? Authorizing Provider  Acetaminophen (TYLENOL PO) Take by mouth.    [provider]  ARIPiprazole (ABILIFY) 2 MG tablet TAKE 1 TABLET BY MOUTH EVERY DAY 03/26/22   Levert Feinstein, MD  aspirin 81 MG tablet Take 81 mg by mouth daily.    [provider]  Cholecalciferol (VITAMIN D) 125 MCG (5000 UT) CAPS Take 5,000 Units by mouth daily.    [provider]  ciprofloxacin (CIPRO) 250 MG tablet Take 250 mg by mouth 2 (two) times daily. 05/20/22   [provider]  dimenhyDRINATE (DRAMAMINE PO) Take 1 tablet by mouth as needed.    [provider]  doxazosin (CARDURA) 8 MG tablet Take 8 mg by mouth every morning.    [provider]  fluticasone (FLONASE) 50 MCG/ACT nasal spray Place 1 spray into both nostrils daily as needed for allergies or rhinitis.    [provider]  IRON PO Take 325 mg by mouth daily.    [provider]  isosorbide mononitrate  (IMDUR) 60 MG 24 hr tablet Take 60 mg by mouth every morning.    [provider]  levothyroxine (SYNTHROID) 75 MCG tablet Take 75 mcg by mouth daily. 12/18/20   [provider]  lidocaine (LIDODERM) 5 % Place 1 patch onto the skin daily. Remove & Discard patch within 12 hours or as directed by MD 11/07/21   Couture, Cortni S, PA-C  LORazepam (ATIVAN) 1 MG tablet Take 1 mg by mouth every 8 (eight) hours as needed for anxiety. 01/20/21   [provider]  LORazepam (ATIVAN) 2 MG tablet SMARTSIG:0.5 pill By Mouth 3 Times a Week 05/11/22   [provider]  memantine (NAMENDA) 10 MG tablet TAKE 1 TABLET BY MOUTH TWICE A DAY 05/04/22   Levert Feinstein, MD  Multiple Vitamin (MULTIVITAMIN) tablet Take 1 tablet by mouth every morning.    [provider]  nitrofurantoin (MACRODANTIN) 100 MG capsule Take 100 mg by mouth daily. 05/12/22   [provider]  QUEtiapine (SEROQUEL) 25 MG tablet TAKE 1 TABLET BY MOUTH EVERYDAY AT BEDTIME 05/25/22   Levert Feinstein, MD  sertraline (ZOLOFT) 100 MG tablet Take 200 mg by mouth every morning.    [provider]  simvastatin (ZOCOR) 40 MG tablet Take 40 mg by mouth at bedtime. 10/31/20   [provider]      Allergies    Nsaids  Review of Systems   Review of Systems  Physical Exam Updated Vital Signs BP (!) 155/80   Pulse (!) 57   Temp 97.7 F (36.5 C) (Oral)   Resp 18   Ht 1.626 m (5\' 4" )   Wt 63.5 kg   SpO2 99%   BMI 24.03 kg/m  Physical Exam Vitals and nursing note reviewed.  Constitutional:      Appearance: She is not diaphoretic.  HENT:     Head: Normocephalic and atraumatic.     Right Ear: External ear normal.     Left Ear: External ear normal.  Eyes:     General: No scleral icterus.       Right eye: No discharge.        Left eye: No discharge.     Conjunctiva/sclera: Conjunctivae normal.  Neck:     Trachea: No tracheal deviation.     Comments: Patient is in a C-spine  collar Cardiovascular:     Rate and Rhythm: Normal rate and regular rhythm.  Pulmonary:     Effort: Pulmonary effort is normal. No respiratory distress.     Breath sounds: Normal breath sounds. No stridor. No wheezing or rales.  Abdominal:     General: Bowel sounds are normal. There is no distension.     Palpations: Abdomen is soft.     Tenderness: There is no abdominal tenderness. There is no guarding or rebound.  Musculoskeletal:        General: No tenderness or deformity.     Cervical back: Neck supple.  Skin:    General: Skin is warm and dry.     Findings: No rash.  Neurological:     General: No focal deficit present.     Mental Status: She is alert.     Cranial Nerves: No cranial nerve deficit (no facial droop, extraocular movements intact, no slurred speech).     Sensory: No sensory deficit.     Motor: No abnormal muscle tone or seizure activity.     Coordination: Coordination normal.     Comments: Patient is able to move all extremities, somnolent but wakes up when I speak to her, able to answer my questions  Psychiatric:        Mood and Affect: Mood normal.     ED Results / Procedures / Treatments   Labs (all labs ordered are listed, but only abnormal results are displayed) Labs Reviewed  CBC - Abnormal; Notable for the following components:      Result Value   RBC 3.63 (*)    Hemoglobin 11.8 (*)    HCT 35.8 (*)    Platelets 145 (*)    All other components within normal limits  BASIC METABOLIC PANEL  URINALYSIS, ROUTINE W REFLEX MICROSCOPIC    EKG None  Radiology CT Cervical Spine Wo Contrast  Result Date: 06/10/2022 CLINICAL DATA:  Status post fall. EXAM: CT CERVICAL SPINE WITHOUT CONTRAST TECHNIQUE: Multidetector CT imaging of the cervical spine was performed without intravenous contrast. Multiplanar CT image reconstructions were also generated. RADIATION DOSE REDUCTION: This exam was performed according to the departmental dose-optimization program which  includes automated exposure control, adjustment of the mA and/or kV according to patient size and/or use of iterative reconstruction technique. COMPARISON:  None Available. FINDINGS: Alignment: Normal. Skull base and vertebrae: No acute fracture. No primary bone lesion or focal pathologic process. Soft tissues and spinal canal: No prevertebral fluid or swelling. No visible canal hematoma. Disc levels: Marked severity endplate sclerosis is  seen at the level of C6-C7. Marked severity narrowing of the anterior atlantoaxial articulation is seen with marked severity intervertebral disc space narrowing at the level of C6-C7. Bilateral mild-to-moderate severity multilevel facet joint hypertrophy is noted. Upper chest: There is mild biapical scarring and/or atelectasis. Other: None. IMPRESSION: 1. No acute fracture or subluxation of the cervical spine. 2. Marked severity degenerative changes at the level of C6-C7. Electronically Signed   By: Aram Candela M.D.   On: 06/10/2022 23:12   CT Head Wo Contrast  Result Date: 06/10/2022 CLINICAL DATA:  Status post fall. EXAM: CT HEAD WITHOUT CONTRAST TECHNIQUE: Contiguous axial images were obtained from the base of the skull through the vertex without intravenous contrast. RADIATION DOSE REDUCTION: This exam was performed according to the departmental dose-optimization program which includes automated exposure control, adjustment of the mA and/or kV according to patient size and/or use of iterative reconstruction technique. COMPARISON:  November 07, 2021 FINDINGS: Brain: There is mild cerebral atrophy with widening of the extra-axial spaces and ventricular dilatation. There are areas of decreased attenuation within the white matter tracts of the supratentorial brain, consistent with microvascular disease changes. Vascular: No hyperdense vessel or unexpected calcification. Skull: Normal. Negative for fracture or focal lesion. Sinuses/Orbits: No acute finding. Other: None.  IMPRESSION: Generalized cerebral atrophy and chronic white matter small vessel ischemic changes without evidence of an acute intracranial abnormality. Electronically Signed   By: Aram Candela M.D.   On: 06/10/2022 23:11   DG Chest Portable 1 View  Result Date: 06/10/2022 CLINICAL DATA:  Status post fall. EXAM: PORTABLE CHEST 1 VIEW COMPARISON:  None Available. FINDINGS: The cardiac silhouette is mildly enlarged and unchanged in size. Low lung volumes are noted. Mild atelectasis is seen within the bilateral lung bases. There is no evidence of an acute infiltrate, pleural effusion or pneumothorax. A stable moderate sized hiatal hernia is seen. Stable spinal stimulator wire positioning is noted. The visualized skeletal structures are unremarkable. IMPRESSION: 1. Stable cardiomegaly with low lung volumes and mild bibasilar atelectasis. 2. Stable moderate sized hiatal hernia. Electronically Signed   By: Aram Candela M.D.   On: 06/10/2022 23:10   DG Pelvis Portable  Result Date: 06/10/2022 CLINICAL DATA:  Status post fall. EXAM: PORTABLE PELVIS 1-2 VIEWS COMPARISON:  July 17, 2014 FINDINGS: There is no evidence of pelvic fracture or diastasis. 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 No pelvic bone lesions are seen. A radiopaque stimulator and associated stimulator wire seen overlying the right sacroiliac joint. IMPRESSION: 1. No acute osseous abnormality. 2. 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 Electronically Signed   By: Aram Candela M.D.   On: 06/10/2022 23:08    Procedures Procedures    Medications Ordered in ED Medications  sodium chloride 0.9 % bolus 500 mL (500 mLs Intravenous Bolus 06/10/22 2328)    Followed by  0.9 %  sodium chloride infusion (has no administration in time range)    ED Course/ Medical Decision Making/ A&P Clinical Course as of  06/10/22 2358  Wed Jun 10, 2022  2352 CT Cervical Spine Wo Contrast [JK]  2352 CT scan without acute findings [JK]  2352 DG Chest Portable 1 View Chest x-ray without acute findings [JK]  2356 Patient continues to improve.  More awake and alert.  Metabolic panel and urinalysis still pending.  Patient states she would prefer to go home if possible [JK]  Clinical Course User Index [JK] Linwood Dibbles, MD                           Medical Decision Making Problems Addressed: Medication adverse effect, initial encounter: acute illness or injury that poses a threat to life or bodily functions  Amount and/or Complexity of Data Reviewed Labs: ordered. Radiology: ordered. Decision-making details documented in ED Course.  Risk Prescription drug management.   Patient presented to ED for evaluation of confusion and weakness falls.  Patient recently started on tramadol and also took lorazepam.  Family states patient appeared intoxicated.  Her symptoms have improved while she is in the ED.  Fortunately no signs of acute injuries on her head CT or C-spine CT.  Chest and pelvis x-rays without acute findings.  CBC without significant abnormalities.  Currently metabolic panel and urinalysis pending.  Will make sure patient can ambulate safely.  She continues to improve and labs are normal anticipate discharge.  Patient will prefer to go home.  We will caution her on discontinuing Ultram and decrease ativan        Final Clinical Impression(s) / ED Diagnoses Final diagnoses:  Medication adverse effect, initial encounter    Rx / DC Orders ED Discharge Orders     None         Linwood Dibbles, MD 06/11/22 0000

## 2022-06-10 NOTE — ED Triage Notes (Signed)
Pt BIB EMS from home for AMS. Pt fell earlier and was taken to her PCP. Pt hit her head when she fell but did not lose consciousness. Pt has had a runny nose all weekend  Hx of dementia  fluids given  146/76 98 room air 56 hr 88 cbg

## 2022-06-11 LAB — URINALYSIS, ROUTINE W REFLEX MICROSCOPIC
Bilirubin Urine: NEGATIVE
Glucose, UA: NEGATIVE mg/dL
Hgb urine dipstick: NEGATIVE
Ketones, ur: NEGATIVE mg/dL
Leukocytes,Ua: NEGATIVE
Nitrite: NEGATIVE
Protein, ur: NEGATIVE mg/dL
Specific Gravity, Urine: 1.015 (ref 1.005–1.030)
pH: 5 (ref 5.0–8.0)

## 2022-06-11 LAB — BASIC METABOLIC PANEL
Anion gap: 6 (ref 5–15)
BUN: 24 mg/dL — ABNORMAL HIGH (ref 8–23)
CO2: 26 mmol/L (ref 22–32)
Calcium: 9.1 mg/dL (ref 8.9–10.3)
Chloride: 107 mmol/L (ref 98–111)
Creatinine, Ser: 0.72 mg/dL (ref 0.44–1.00)
GFR, Estimated: >60 mL/min (ref >60)
Glucose, Bld: 92 mg/dL (ref 70–99)
Potassium: 3.6 mmol/L (ref 3.5–5.1)
Sodium: 139 mmol/L (ref 135–145)

## 2022-06-11 MED ORDER — ONDANSETRON 8 MG PO TBDP: 8.0000 mg | ORAL_TABLET | Freq: Three times a day (TID) | ORAL | 0 refills | Status: DC | PRN

## 2022-06-11 MED ORDER — MECLIZINE HCL 25 MG PO TABS: 25.0000 mg | ORAL_TABLET | Freq: Three times a day (TID) | ORAL | 0 refills | Status: DC | PRN

## 2022-06-11 NOTE — Discharge Instructions (Signed)
Return if you are having any problems. 

## 2022-06-11 NOTE — ED Provider Notes (Signed)
Care assumed from Dr. Lynelle Doctor, patient with falls, altered mental status which has resolved. She is pending urinalysis and BMP. She will also have trial of ambulation.  I have reviewed and interpreted the urinalysis, and my interpretation is no evidence of urinary tract infection.  Patient was stable with ambulation and can safely be discharged.  Family member request some medication for nausea and vertigo.  I am giving her prescriptions for meclizine and ondansetron oral dissolving tablet.   Dione Booze, MD 06/11/22 [phone]

## 2022-06-11 NOTE — ED Notes (Signed)
Pt ambulated around the room w/ minimal assistance.

## 2022-06-16 ENCOUNTER — Emergency Department (HOSPITAL_COMMUNITY): Payer: Medicare Other

## 2022-06-16 ENCOUNTER — Other Ambulatory Visit: Payer: Self-pay

## 2022-06-16 ENCOUNTER — Encounter (HOSPITAL_COMMUNITY): Payer: Self-pay

## 2022-06-16 ENCOUNTER — Emergency Department (HOSPITAL_COMMUNITY)
Admission: EM | Admit: 2022-06-16 | Discharge: 2022-06-16 | Disposition: A | Discharge status: Home / Self Care | Payer: Medicare Other | Attending: Emergency Medicine | Admitting: Emergency Medicine

## 2022-06-16 DIAGNOSIS — S0990XA Unspecified injury of head, initial encounter: Secondary | ICD-10-CM | POA: Diagnosis present

## 2022-06-16 DIAGNOSIS — W06XXXA Fall from bed, initial encounter: Secondary | ICD-10-CM | POA: Insufficient documentation

## 2022-06-16 DIAGNOSIS — I2510 Atherosclerotic heart disease of native coronary artery without angina pectoris: Secondary | ICD-10-CM | POA: Insufficient documentation

## 2022-06-16 DIAGNOSIS — Z79899 Other long term (current) drug therapy: Secondary | ICD-10-CM | POA: Diagnosis not present

## 2022-06-16 DIAGNOSIS — G309 Alzheimer's disease, unspecified: Secondary | ICD-10-CM | POA: Insufficient documentation

## 2022-06-16 DIAGNOSIS — R079 Chest pain, unspecified: Secondary | ICD-10-CM | POA: Diagnosis not present

## 2022-06-16 DIAGNOSIS — Z7982 Long term (current) use of aspirin: Secondary | ICD-10-CM | POA: Diagnosis not present

## 2022-06-16 DIAGNOSIS — W19XXXA Unspecified fall, initial encounter: Secondary | ICD-10-CM

## 2022-06-16 DIAGNOSIS — I10 Essential (primary) hypertension: Secondary | ICD-10-CM | POA: Diagnosis not present

## 2022-06-16 LAB — BASIC METABOLIC PANEL
Anion gap: 7 (ref 5–15)
BUN: 15 mg/dL (ref 8–23)
CO2: 29 mmol/L (ref 22–32)
Calcium: 9.5 mg/dL (ref 8.9–10.3)
Chloride: 109 mmol/L (ref 98–111)
Creatinine, Ser: 0.82 mg/dL (ref 0.44–1.00)
GFR, Estimated: >60 mL/min (ref >60)
Glucose, Bld: 114 mg/dL — ABNORMAL HIGH (ref 70–99)
Potassium: 4 mmol/L (ref 3.5–5.1)
Sodium: 145 mmol/L (ref 135–145)

## 2022-06-16 LAB — CBC
HCT: 38.3 % (ref 36.0–46.0)
Hemoglobin: 12.5 g/dL (ref 12.0–15.0)
MCH: 32.5 pg (ref 26.0–34.0)
MCHC: 32.6 g/dL (ref 30.0–36.0)
MCV: 99.5 fL (ref 80.0–100.0)
Platelets: 143 10*3/uL — ABNORMAL LOW (ref 150–400)
RBC: 3.85 MIL/uL — ABNORMAL LOW (ref 3.87–5.11)
RDW: 12.4 % (ref 11.5–15.5)
WBC: 5.5 10*3/uL (ref 4.0–10.5)
nRBC: 0 % (ref 0.0–0.2)

## 2022-06-16 LAB — TROPONIN I (HIGH SENSITIVITY)
Troponin I (High Sensitivity): 3 ng/L (ref <18)
Troponin I (High Sensitivity): 3 ng/L (ref <18)

## 2022-06-16 MED ORDER — ACETAMINOPHEN 500 MG PO TABS
1000.0000 mg | ORAL_TABLET | Freq: Once | ORAL | Status: AC
  Administered 2022-06-16: 1000 mg via ORAL
  Filled 2022-06-16: qty 2

## 2022-06-16 NOTE — ED Notes (Signed)
Patient transported to CT 

## 2022-06-16 NOTE — ED Provider Notes (Addendum)
[facility] DEPT Provider Note   CSN: [id number] Arrival date & time: 06/16/22  1718     History  Chief Complaint  Patient presents with   Fall   Headache   Chest Pain    [name] is a 81 y.o. female.   Fall Associated symptoms include chest pain and headaches.  Headache Chest Pain Associated symptoms: headache   Patient is an 81 year old female with past medical history significant for Alzheimer's, chronic left foot drop, HTN, HLD, CAD, TIA, memory loss, occasional aggressive behavior  She presented emergency room today with her daughter who informs me that she had a fall 2 days ago when she was attempting to get into/out of bed.  Seem that she has a history of chronic left foot drop.  She has had some falls recently in the past.  Does also have a history of vertigo.  Patient states she has no symptoms currently apart from some left-sided head pain.  Patient has endorsed nausea without vomiting.  Patient also did endorse some chest pain with daughter earlier today however does not recall having any chest pain when I interviewed her now.  No dyspnea, lightheadedness cough fever hemoptysis.  No other associate symptoms.     Home Medications Prior to Admission medications   Medication Sig Start Date End Date Taking? Authorizing Provider  Acetaminophen (TYLENOL PO) Take by mouth.    [provider]  ARIPiprazole (ABILIFY) 2 MG tablet TAKE 1 TABLET BY MOUTH EVERY DAY 03/26/22   Marcial Pacas, MD  aspirin 81 MG tablet Take 81 mg by mouth daily.    [provider]  Cholecalciferol (VITAMIN D) 125 MCG (5000 UT) CAPS Take 5,000 Units by mouth daily.    [provider]  ciprofloxacin (CIPRO) 250 MG tablet Take 250 mg by mouth 2 (two) times daily. 05/20/22   [provider]  dimenhyDRINATE (DRAMAMINE PO) Take 1 tablet by mouth as needed.    [provider]  doxazosin (CARDURA) 8 MG tablet Take 8 mg by mouth  every morning.    [provider]  fluticasone (FLONASE) 50 MCG/ACT nasal spray Place 1 spray into both nostrils daily as needed for allergies or rhinitis.    [provider]  IRON PO Take 325 mg by mouth daily.    [provider]  isosorbide mononitrate (IMDUR) 60 MG 24 hr tablet Take 60 mg by mouth every morning.    [provider]  levothyroxine (SYNTHROID) 75 MCG tablet Take 75 mcg by mouth daily. 12/18/20   [provider]  lidocaine (LIDODERM) 5 % Place 1 patch onto the skin daily. Remove & Discard patch within 12 hours or as directed by MD 11/07/21   Couture, Cortni S, PA-C  LORazepam (ATIVAN) 1 MG tablet Take 1 mg by mouth every 8 (eight) hours as needed for anxiety. 01/20/21   [provider]  LORazepam (ATIVAN) 2 MG tablet SMARTSIG:0.5 pill By Mouth 3 Times a Week 05/11/22   [provider]  meclizine (ANTIVERT) 25 MG tablet Take 1 tablet (25 mg total) by mouth 3 (three) times daily as needed for dizziness. 0/16/01   Delora Fuel, MD  memantine (NAMENDA) 10 MG tablet TAKE 1 TABLET BY MOUTH TWICE A DAY 05/04/22   Marcial Pacas, MD  Multiple Vitamin (MULTIVITAMIN) tablet Take 1 tablet by mouth every morning.    [provider]  nitrofurantoin (MACRODANTIN) 100 MG capsule Take 100 mg by mouth daily. 05/12/22   [provider]  ondansetron (ZOFRAN-ODT) 8 MG disintegrating tablet Take 1 tablet (8 mg total) by mouth every 8 (eight) hours as needed for nausea or vomiting. 06/11/22   Dione Booze, MD  QUEtiapine (SEROQUEL) 25 MG tablet TAKE 1 TABLET BY MOUTH EVERYDAY AT BEDTIME 05/25/22   Levert Feinstein, MD  sertraline (ZOLOFT) 100 MG tablet Take 200 mg by mouth every morning.    [provider]  simvastatin (ZOCOR) 40 MG tablet Take 40 mg by mouth at bedtime. 10/31/20   [provider]      Allergies    Nsaids    Review of Systems   Review of Systems  Cardiovascular:  Positive for chest pain.  Neurological:   Positive for headaches.    Physical Exam Updated Vital Signs BP (!) 150/76   Pulse (!) 58   Temp 98.1 F (36.7 C) (Oral)   Resp 13   Ht 5\' 4"  (1.626 m)   Wt 63.5 kg   SpO2 96%   BMI 24.03 kg/m  Physical Exam Vitals and nursing note reviewed.  Constitutional:      General: She is not in acute distress.    Comments: Pleasant 81 year old female in no acute distress  HENT:     Head: Normocephalic and atraumatic.     Nose: Nose normal.  Eyes:     General: No scleral icterus. Cardiovascular:     Rate and Rhythm: Normal rate and regular rhythm.     Pulses: Normal pulses.     Heart sounds: Normal heart sounds.  Pulmonary:     Effort: Pulmonary effort is normal. No respiratory distress.     Breath sounds: No wheezing.  Abdominal:     Palpations: Abdomen is soft.     Tenderness: There is no abdominal tenderness.  Musculoskeletal:     Cervical back: Normal range of motion.     Right lower leg: No edema.     Left lower leg: No edema.  Skin:    General: Skin is warm and dry.     Capillary Refill: Capillary refill takes less than 2 seconds.  Neurological:     Mental Status: She is alert. Mental status is at baseline.     Comments: Moves all 4 extremities, smile symmetric, sensation intact in all 4 extremities  Psychiatric:        Mood and Affect: Mood normal.        Behavior: Behavior normal.     ED Results / Procedures / Treatments   Labs (all labs ordered are listed, but only abnormal results are displayed) Labs Reviewed  BASIC METABOLIC PANEL - Abnormal; Notable for the following components:      Result Value   Glucose, Bld 114 (*)    All other components within normal limits  CBC - Abnormal; Notable for the following components:   RBC 3.85 (*)    Platelets 143 (*)    All other components within normal limits  TROPONIN I (HIGH SENSITIVITY)  TROPONIN I (HIGH SENSITIVITY)    EKG EKG Interpretation  Date/Time:  Tuesday June 16 2022 18:05:11  EDT Ventricular Rate:  65 PR Interval:  147 QRS Duration: 87 QT Interval:  414 QTC Calculation: 431 R Axis:   -19 Text Interpretation: Sinus rhythm Borderline left axis deviation Confirmed by [id number] [phone]) on 06/16/2022 6:16:29 PM  Radiology CT HEAD WO CONTRAST (06/18/2022)  Result Date: 06/16/2022 CLINICAL DATA:  Trauma, fall EXAM: CT HEAD WITHOUT CONTRAST TECHNIQUE: Contiguous axial images were obtained from the  base of the skull through the vertex without intravenous contrast. RADIATION DOSE REDUCTION: This exam was performed according to the departmental dose-optimization program which includes automated exposure control, adjustment of the mA and/or kV according to patient size and/or use of iterative reconstruction technique. COMPARISON:  06/10/2022 FINDINGS: Brain: No acute intracranial findings are seen. There are no signs of bleeding within the cranium. Ventricles are not dilated. Cortical sulci are prominent. There is decreased density in periventricular white matter. There is possible small old lacunar infarct in left basal ganglia. Vascular: Scattered arterial calcifications are seen. Skull: Unremarkable. Sinuses/Orbits: There is mucosal thickening in ethmoid sinus. Other: None. IMPRESSION: No acute intracranial findings are seen in noncontrast CT brain. Atrophy. Small-vessel disease. Electronically Signed   By: Ernie AvenaPalani  Rathinasamy M.D.   On: 06/16/2022 19:02   DG Chest 2 View  Result Date: 06/16/2022 CLINICAL DATA:  Chest pain EXAM: CHEST - 2 VIEW COMPARISON:  06/10/2022 FINDINGS: Transverse diameter of heart is increased. Thoracic aorta is tortuous and ectatic. There are no signs of pulmonary edema or focal pulmonary consolidation. There is fixed hiatal hernia. Linear densities in medial left lower lung fields may suggest subsegmental atelectasis. There is no pleural effusion or pneumothorax. There is pain control lead in thoracic spinal canal. IMPRESSION: There are no signs of  pulmonary edema or focal pulmonary consolidation. Small linear densities in medial left lower lung fields suggest subsegmental atelectasis. Fixed hiatal hernia. Electronically Signed   By: Ernie AvenaPalani  Rathinasamy M.D.   On: 06/16/2022 18:10    Procedures Procedures    Medications Ordered in ED Medications  acetaminophen (TYLENOL) tablet 1,000 mg (1,000 mg Oral Given 06/16/22 1911)    ED Course/ Medical Decision Making/ A&P                           Medical Decision Making Amount and/or Complexity of Data Reviewed Labs: ordered. Radiology: ordered.  Risk OTC drugs.   Patient is an 81 year old female with past medical history significant for Alzheimer's, chronic left foot drop, HTN, HLD, CAD, TIA, memory loss, occasional aggressive behavior  She presented emergency room today with her daughter who informs me that she had a fall 2 days ago when she was attempting to get into/out of bed.  Seem that she has a history of chronic left foot drop.  She has had some falls recently in the past.  Does also have a history of vertigo.  Patient states she has no symptoms currently apart from some left-sided head pain.  Patient has endorsed nausea without vomiting.  Patient also did endorse some chest pain with daughter earlier today however does not recall having any chest pain when I interviewed her now.  No dyspnea, lightheadedness cough fever hemoptysis.  No other associate symptoms.   I personally viewed chest x-ray and CT head which are without any acute abnormal findings.  I agree with radiology read.  Troponin within normal limits, CBC unremarkable BMP unremarkable  EKG nonischemic.  I discussed this case with my attending physician who cosigned this note including patient's presenting symptoms, physical exam, and planned diagnostics and interventions. Attending physician stated agreement with plan or made changes to plan which were implemented.   Patient received 1 dose of Tylenol.  Feels  improved.  Will discharge home at this time.  Recommend close follow-up with neurology to further discuss foot drop and PCP or gerontologist follow-up.  Final Clinical Impression(s) / ED Diagnoses Final diagnoses:  Head injury  Fall, initial encounter    Rx / DC Orders ED Discharge Orders     None         Gailen Shelter, [place] 06/16/22 2232    Gailen Shelter, [place] 06/16/22 2233    Wynetta Fines, MD 06/16/22 2253

## 2022-06-16 NOTE — Discharge Instructions (Signed)
Please avoid taking tramadol.  He may take Tylenol 1000 mg as needed for pain up to every 6 hours.  4 g total per day max

## 2022-06-16 NOTE — ED Triage Notes (Addendum)
Patient 's daughter reports that the patient fell when she got out of bed yesterday. Patient c/o pain behind the left ear and patient's daughter reports that the patient had hit her head on a step stool. Patient also c/o nausea and has been receiving zofran which helps. Patient's daughter also reports a history of vertigo.  Patient's daughter added that the patient c/o chest pain.  Patient has a history of dementia.

## 2022-06-22 ENCOUNTER — Telehealth: Payer: Self-pay | Admitting: Neurology

## 2022-06-22 NOTE — Telephone Encounter (Signed)
Pt's daughter is asking for a call to discuss concerns she has about pt's medications.

## 2022-06-22 NOTE — Telephone Encounter (Signed)
Daughter asking for refills for Ativan, med is prescribed my pcp. Pt has had frequent falls and seen in ED, states memory and behavior has worsen,  Appt made in October.

## 2022-06-23 ENCOUNTER — Telehealth: Payer: Self-pay | Admitting: Neurology

## 2022-06-23 NOTE — Telephone Encounter (Signed)
At 1:22 there was a vm left re: pt.  The person did not state their name. The message stated that Pt is having episodes of crying about her stomach hurting since last Wed or Friday of last week.  The caller asked for a call back on [phone].  Phone rep called the # to try and determine who placed the call, no one picked up, no vm.

## 2022-06-23 NOTE — Telephone Encounter (Signed)
We spoke with pt's daughter yesterday and will see the pt in OCT for f/u.

## 2022-06-24 NOTE — Telephone Encounter (Signed)
Error

## 2022-06-26 ENCOUNTER — Telehealth: Payer: Self-pay | Admitting: Neurology

## 2022-06-26 MED ORDER — QUETIAPINE FUMARATE 25 MG PO TABS: 25.0000 mg | ORAL_TABLET | Freq: Every day | ORAL | 3 refills | Status: DC

## 2022-06-26 MED ORDER — LORAZEPAM 2 MG PO TABS: 1.0000 mg | ORAL_TABLET | Freq: Every evening | ORAL | 0 refills | Status: DC

## 2022-06-26 NOTE — Telephone Encounter (Signed)
Daughter calls through answer service stating she spilled the patient's medications, requesting refills of ativan / Lorazepam and there are 2 different prescriptions on the list- 1 mg and 2 mg and instructions vary as well.  It says historical provider filled 01-26-2021?   Please call on Monday and discuss- this is suspicious.  I wrote for 15 tabs to get her through the weekend.

## 2022-06-27 ENCOUNTER — Emergency Department (HOSPITAL_COMMUNITY): Payer: Medicare Other

## 2022-06-27 ENCOUNTER — Other Ambulatory Visit: Payer: Self-pay

## 2022-06-27 ENCOUNTER — Emergency Department (HOSPITAL_COMMUNITY)
Admission: EM | Admit: 2022-06-27 | Discharge: 2022-06-27 | Disposition: A | Discharge status: Home / Self Care | Payer: Medicare Other | Attending: Student | Admitting: Student

## 2022-06-27 DIAGNOSIS — Z7982 Long term (current) use of aspirin: Secondary | ICD-10-CM | POA: Diagnosis not present

## 2022-06-27 DIAGNOSIS — W19XXXA Unspecified fall, initial encounter: Secondary | ICD-10-CM

## 2022-06-27 DIAGNOSIS — R1084 Generalized abdominal pain: Secondary | ICD-10-CM | POA: Insufficient documentation

## 2022-06-27 DIAGNOSIS — F0390 Unspecified dementia without behavioral disturbance: Secondary | ICD-10-CM | POA: Diagnosis not present

## 2022-06-27 DIAGNOSIS — S0003XA Contusion of scalp, initial encounter: Secondary | ICD-10-CM | POA: Diagnosis not present

## 2022-06-27 DIAGNOSIS — S0990XA Unspecified injury of head, initial encounter: Secondary | ICD-10-CM | POA: Diagnosis present

## 2022-06-27 DIAGNOSIS — I10 Essential (primary) hypertension: Secondary | ICD-10-CM | POA: Diagnosis not present

## 2022-06-27 DIAGNOSIS — W109XXA Fall (on) (from) unspecified stairs and steps, initial encounter: Secondary | ICD-10-CM | POA: Insufficient documentation

## 2022-06-27 LAB — BASIC METABOLIC PANEL
Anion gap: 11 (ref 5–15)
BUN: 15 mg/dL (ref 8–23)
CO2: 23 mmol/L (ref 22–32)
Calcium: 9.6 mg/dL (ref 8.9–10.3)
Chloride: 104 mmol/L (ref 98–111)
Creatinine, Ser: 0.81 mg/dL (ref 0.44–1.00)
GFR, Estimated: >60 mL/min (ref >60)
Glucose, Bld: 108 mg/dL — ABNORMAL HIGH (ref 70–99)
Potassium: 3.8 mmol/L (ref 3.5–5.1)
Sodium: 138 mmol/L (ref 135–145)

## 2022-06-27 LAB — CBC
HCT: 40.8 % (ref 36.0–46.0)
Hemoglobin: 13.5 g/dL (ref 12.0–15.0)
MCH: 31.9 pg (ref 26.0–34.0)
MCHC: 33.1 g/dL (ref 30.0–36.0)
MCV: 96.5 fL (ref 80.0–100.0)
Platelets: 159 10*3/uL (ref 150–400)
RBC: 4.23 MIL/uL (ref 3.87–5.11)
RDW: 12.1 % (ref 11.5–15.5)
WBC: 9.6 10*3/uL (ref 4.0–10.5)
nRBC: 0 % (ref 0.0–0.2)

## 2022-06-27 MED ORDER — FENTANYL CITRATE PF 50 MCG/ML IJ SOSY
25.0000 ug | PREFILLED_SYRINGE | Freq: Once | INTRAMUSCULAR | Status: AC
  Administered 2022-06-27: 25 ug via INTRAVENOUS
  Filled 2022-06-27: qty 1

## 2022-06-27 MED ORDER — IOHEXOL 350 MG/ML SOLN
75.0000 mL | Freq: Once | INTRAVENOUS | Status: AC | PRN
  Administered 2022-06-27: 75 mL via INTRAVENOUS

## 2022-06-27 NOTE — ED Notes (Signed)
Pt able to ambulate within the room with little assistance, EDP at bedside to witness

## 2022-06-27 NOTE — Discharge Instructions (Signed)
As we discussed, your work-up in the ER today was reassuring for acute findings.  I strongly suggest a follow-up with your primary care doctor in the next 1 week for continued evaluation and management of your symptoms.  Return if development of any new or worsening symptoms.

## 2022-06-27 NOTE — ED Provider Triage Note (Signed)
Emergency Medicine Provider Triage Evaluation Note  BENAY POMEROY , a 81 y.o. female  was evaluated in triage.  Pt complains of headache, neck pain, low back pain secondary to a fall.  Patient was carrying something up her carport steps when she lost her balance and fell over backwards, hitting her head on the concrete floor.  She denies loss of consciousness.  She does complain of a small hematoma to the back of her head and a small abrasion with bleeding controlled.  Patient denies blood thinner usage.  Review of Systems  Positive: As above Negative: As above  Physical Exam  BP (!) 146/82 (BP Location: Right Arm)   Pulse 77   Temp 98.2 F (36.8 C) (Oral)   Resp 16   SpO2 93%  Gen:   Awake, no distress   Resp:  Normal effort  MSK:   Moves extremities without difficulty  Other:    Medical Decision Making  Medically screening exam initiated at 5:51 PM.  Appropriate orders placed.  Taurus Willis Pereda was informed that the remainder of the evaluation will be completed by another provider, this initial triage assessment does not replace that evaluation, and the importance of remaining in the ED until their evaluation is complete.     Dorothyann Peng, PA-C 06/27/22 1752

## 2022-06-27 NOTE — ED Provider Notes (Signed)
[facility] Provider Note   CSN: [id number] Arrival date & time: 06/27/22  1732     History  No chief complaint on file.   [name] is a 81 y.o. female.  Patient with history of hypertension, hyperlipidemia, and dementia presents today with complaints of fall. Patients daughter who is present in the room and witnessed the fall provides most of the history given patients history of dementia. Daughter states that the patient was carrying something up her carport steps and tripped and fell backwards down one step and hit her head on the concrete. She did not loose consciousness. She is at her baseline mental status according to patients daughter. Currently endorses headache and abdominal pain. She is not anticoagulated. No nausea, vomiting, or diarrhea.  Level 5 caveat--- dementia  The history is provided by the patient. No language interpreter was used.       Home Medications Prior to Admission medications   Medication Sig Start Date End Date Taking? Authorizing Provider  Acetaminophen (TYLENOL PO) Take by mouth.    [provider]  ARIPiprazole (ABILIFY) 2 MG tablet TAKE 1 TABLET BY MOUTH EVERY DAY 03/26/22   Marcial Pacas, MD  aspirin 81 MG tablet Take 81 mg by mouth daily.    [provider]  Cholecalciferol (VITAMIN D) 125 MCG (5000 UT) CAPS Take 5,000 Units by mouth daily.    [provider]  ciprofloxacin (CIPRO) 250 MG tablet Take 250 mg by mouth 2 (two) times daily. 05/20/22   [provider]  dimenhyDRINATE (DRAMAMINE PO) Take 1 tablet by mouth as needed.    [provider]  doxazosin (CARDURA) 8 MG tablet Take 8 mg by mouth every morning.    [provider]  fluticasone (FLONASE) 50 MCG/ACT nasal spray Place 1 spray into both nostrils daily as needed for allergies or rhinitis.    [provider]  IRON PO Take 325 mg by mouth daily.    [provider]  isosorbide  mononitrate (IMDUR) 60 MG 24 hr tablet Take 60 mg by mouth every morning.    [provider]  levothyroxine (SYNTHROID) 75 MCG tablet Take 75 mcg by mouth daily. 12/18/20   [provider]  lidocaine (LIDODERM) 5 % Place 1 patch onto the skin daily. Remove & Discard patch within 12 hours or as directed by MD 11/07/21   Couture, Cortni S, PA-C  LORazepam (ATIVAN) 1 MG tablet Take 1 mg by mouth every 8 (eight) hours as needed for anxiety. 01/20/21   [provider]  LORazepam (ATIVAN) 2 MG tablet Take 0.5 tablets (1 mg total) by mouth every evening. 06/26/22   Dohmeier, Asencion Partridge, MD  meclizine (ANTIVERT) 25 MG tablet Take 1 tablet (25 mg total) by mouth 3 (three) times daily as needed for dizziness. [id number]   Delora Fuel, MD  memantine (NAMENDA) 10 MG tablet TAKE 1 TABLET BY MOUTH TWICE A DAY 05/04/22   Marcial Pacas, MD  Multiple Vitamin (MULTIVITAMIN) tablet Take 1 tablet by mouth every morning.    [provider]  nitrofurantoin (MACRODANTIN) 100 MG capsule Take 100 mg by mouth daily. 05/12/22   [provider]  ondansetron (ZOFRAN-ODT) 8 MG disintegrating tablet Take 1 tablet (8 mg total) by mouth every 8 (eight) hours as needed for nausea or vomiting. [id number]   Delora Fuel, MD  QUEtiapine (SEROQUEL) 25 MG tablet Take 1 tablet (25 mg total) by mouth at bedtime. 06/26/22   Dohmeier, Asencion Partridge, MD  sertraline (ZOLOFT) 100 MG tablet Take 200 mg by mouth every morning.    [provider]  simvastatin (ZOCOR) 40 MG tablet Take 40 mg by mouth at bedtime. 10/31/20   [provider]      Allergies    Nsaids    Review of Systems   Review of Systems  Unable to perform ROS: Dementia  Gastrointestinal:  Positive for abdominal pain.  Neurological:  Positive for headaches.  All other systems reviewed and are negative.   Physical Exam Updated Vital Signs BP 136/81   Pulse 70   Temp 98.2 F (36.8 C) (Oral)   Resp 20   SpO2 93%  Physical Exam Vitals  and nursing note reviewed.  Constitutional:      General: She is not in acute distress.    Appearance: Normal appearance. She is normal weight. She is not ill-appearing, toxic-appearing or diaphoretic.  HENT:     Head: Normocephalic and atraumatic.     Comments: Scalp hematoma noted over the occipital region of the head without overlying wound. No crepitus or deformity.  No battle's sign or racoon eyes Eyes:     Extraocular Movements: Extraocular movements intact.     Pupils: Pupils are equal, round, and reactive to light.  Cardiovascular:     Rate and Rhythm: Normal rate and regular rhythm.     Heart sounds: Normal heart sounds.  Pulmonary:     Effort: Pulmonary effort is normal. No respiratory distress.     Breath sounds: Normal breath sounds.  Abdominal:     General: Abdomen is flat.     Palpations: Abdomen is soft.     Tenderness: There is abdominal tenderness.     Comments: Generalized abdominal tenderness to palpation. No bruising  Musculoskeletal:        General: Normal range of motion.     Cervical back: Normal range of motion and neck supple.     Comments: No tenderness to palpation of cervical, thoracic, or lumbar spine. No stepoffs, overlying skin changes, or deformity.  Tenderness noted to palpation of the right hip without external rotation or deformity. DP and PT pulses intact and 2+  Skin:    General: Skin is warm and dry.  Neurological:     General: No focal deficit present.     Mental Status: She is alert. Mental status is at baseline.     Comments: Moving all extremities equally  Psychiatric:        Mood and Affect: Mood normal.        Behavior: Behavior normal.     ED Results / Procedures / Treatments   Labs (all labs ordered are listed, but only abnormal results are displayed) Labs Reviewed  BASIC METABOLIC PANEL - Abnormal; Notable for the following components:      Result Value   Glucose, Bld 108 (*)    All other components within normal limits   CBC    EKG None  Radiology CT Head Wo Contrast  Result Date: 06/27/2022 CLINICAL DATA:  Head trauma, minor (Age >= 65y); Neck trauma (Age >= 65y). Pt here from home via [facility]. Pt was walking up stairs in her garage and fell backwards down 2 steps, no LOC. Pt c/o R lower back pain. No blood thinners. Pt's family witnessed all, pt has hematoma to posterior head. 160/76, 93% RA, 73HR EXAM: CT HEAD WITHOUT CONTRAST CT CERVICAL SPINE WITHOUT CONTRAST TECHNIQUE: Multidetector CT imaging of the head and cervical spine was performed following the  standard protocol without intravenous contrast. Multiplanar CT image reconstructions of the cervical spine were also generated. RADIATION DOSE REDUCTION: This exam was performed according to the departmental dose-optimization program which includes automated exposure control, adjustment of the mA and/or kV according to patient size and/or use of iterative reconstruction technique. COMPARISON:  CT head and C-spine 06/16/2022 FINDINGS: CT HEAD FINDINGS BRAIN: BRAIN Patchy and confluent areas of decreased attenuation are noted throughout the deep and periventricular white matter of the cerebral hemispheres bilaterally, compatible with chronic microvascular ischemic disease. No evidence of large-territorial acute infarction. No parenchymal hemorrhage. No mass lesion. No extra-axial collection. No mass effect or midline shift. No hydrocephalus. Basilar cisterns are patent. Vascular: No hyperdense vessel. Skull: No acute fracture or focal lesion. Sinuses/Orbits: Paranasal sinuses and mastoid air cells are clear. The orbits are unremarkable. Other: Left occipital scalp 8 mm hematoma formation. CT CERVICAL SPINE FINDINGS Alignment: Normal. Skull base and vertebrae: Degenerative changes at C1-C2 and C6-C7 levels. No associated severe osseous central canal or neural foraminal stenosis. No acute fracture. No aggressive appearing focal osseous lesion or focal pathologic process.  Soft tissues and spinal canal: No prevertebral fluid or swelling. No visible canal hematoma. Upper chest: Biapical pleural/pulmonary scarring. 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 IMPRESSION: 1. No acute intracranial abnormality. 2. No acute displaced fracture or traumatic listhesis of the cervical spine. 3. Left occipital scalp 8 mm hematoma. Electronically Signed   By: Iven Finn M.D.   On: 06/27/2022 19:12   CT Cervical Spine Wo Contrast  Result Date: 06/27/2022 CLINICAL DATA:  Head trauma, minor (Age >= 65y); Neck trauma (Age >= 65y). Pt here from home [facility]. Pt was walking up stairs in her garage and fell backwards down 2 steps, no LOC. Pt c/o R lower back pain. No blood thinners. Pt's family witnessed all, pt has hematoma to posterior head. 160/76, 93% RA, 73HR EXAM: CT HEAD WITHOUT CONTRAST CT CERVICAL SPINE WITHOUT CONTRAST TECHNIQUE: Multidetector CT imaging of the head and cervical spine was performed following the standard protocol without intravenous contrast. Multiplanar CT image reconstructions of the cervical spine were also generated. RADIATION DOSE REDUCTION: This exam was performed according to the departmental dose-optimization program which includes automated exposure control, adjustment of the mA and/or kV according to patient size and/or use of iterative reconstruction technique. COMPARISON:  CT head and C-spine 06/16/2022 FINDINGS: CT HEAD FINDINGS BRAIN: BRAIN Patchy and confluent areas of decreased attenuation are noted throughout the deep and periventricular white matter of the cerebral hemispheres bilaterally, compatible with chronic microvascular ischemic disease. No evidence of large-territorial acute infarction. No parenchymal hemorrhage. No mass lesion. No extra-axial collection. No mass effect or midline shift. No hydrocephalus. Basilar cisterns are patent. Vascular: No hyperdense vessel. Skull: No acute fracture or focal lesion.  Sinuses/Orbits: Paranasal sinuses and mastoid air cells are clear. The orbits are unremarkable. Other: Left occipital scalp 8 mm hematoma formation. CT CERVICAL SPINE FINDINGS Alignment: Normal. Skull base and vertebrae: Degenerative changes at C1-C2 and C6-C7 levels. No associated severe osseous central canal or neural foraminal stenosis. No acute fracture. No aggressive appearing focal osseous lesion or focal pathologic process. Soft tissues and spinal canal: No prevertebral fluid or swelling. No visible canal hematoma. Upper chest: Biapical pleural/pulmonary scarring. 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 IMPRESSION: 1. No acute intracranial abnormality. 2. No acute displaced fracture or traumatic listhesis of the cervical spine. 3. Left occipital scalp 8 mm hematoma. Electronically Signed  By: Iven Finn M.D.   On: 06/27/2022 19:12   DG Lumbar Spine Complete  Result Date: 06/27/2022 CLINICAL DATA:  Low back pain after fall EXAM: LUMBAR SPINE - COMPLETE 4+ VIEW COMPARISON:  Radiographs 05/27/2022 FINDINGS: No evidence of acute fracture or traumatic malalignment in the lumbar spine noting decreased sensitivity due to demineralization. Multilevel spondylosis and facet arthropathy. Moderate to advanced disc space height loss at L2-L3, L4-L5, and L5-S1. Spinal cord stimulator. IMPRESSION: No evidence of acute fracture or traumatic malalignment. Multilevel spondylosis and disc space height loss. Electronically Signed   By: Placido Sou M.D.   On: 06/27/2022 19:05    Procedures Procedures    Medications Ordered in ED Medications  fentaNYL (SUBLIMAZE) injection 25 mcg (25 mcg Intravenous Given 06/27/22 2104)  iohexol (OMNIPAQUE) 350 MG/ML injection 75 mL (75 mLs Intravenous Contrast Given 06/27/22 2051)    ED Course/ Medical Decision Making/ A&P                           Medical Decision Making Amount and/or Complexity of Data Reviewed Labs:  ordered. Radiology: ordered.  Risk Prescription drug management.   This patient presents to the ED for concern of fall, this involves an extensive number of treatment options, and is a complaint that carries with it a high risk of complications and morbidity.    Co morbidities that complicate the patient evaluation  Hx dementia   Additional history obtained:  Additional history obtained from patients daughter present at bedside External records from outside source obtained and reviewed including epic chart review    Lab Tests:  I Ordered, and personally interpreted labs.  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 No acute pelvic findings CT T and L spine:  1. There is mild vertebral body height loss in T3, T4, T7, and T9 compared to 11/07/2021, of indeterminate acuity but possibly acute. 2-3 mm retropulsion at T7 where there is also 30% vertebral body height loss. Correlate with point tenderness and consider MRI if clinically indicated. 2. No acute fracture in the lumbar spine. 3. Multilevel degenerative changes, most prominent at L4-L5 where there is mild spinal canal stenosis, mild left neural foraminal narrowing, and moderate right neural foraminal narrowing. I agree with the radiologist interpretation   Cardiac Monitoring: / EKG:  The patient was maintained on a cardiac monitor.  I personally viewed and interpreted the cardiac monitored which showed an underlying rhythm of: NSR   Consultations Obtained:  I requested consultation with the general surgery Dr. Edsel Petrin,  and discussed lab and imaging findings as  well as pertinent plan - they recommend: discussed the gastric volvulus seen on imaging. Surgery states that this is no indication for further intervention of this as patient is not vomiting and is asymptomatic.   Problem List / ED Course / Critical interventions / Medication management  Fall I ordered medication including fentanyl  for pain  Reevaluation of the patient after these medicines showed that the patient improved I have reviewed the patients home medicines and have made adjustments as needed   Test / Admission - Considered:  Patient presents today with  complaints of mechanical fall.  She is afebrile, nontoxic-appearing, and in no acute distress with reassuring vital signs.  She is also at her neurologic baseline per her daughter that is present in the room. Imaging performed that was unremarkable for acute findings. There was some concern about a potential T7 fracture, however patient is not tender in this area and is able to get up and walk around without assistance which is her baseline and states that she is feeling significantly improved. She also denies any loss of bowel or bladder function or saddle paraesthesias. No concern for cauda equina. Therefore will defer emergent MRI at this time. She lives with her daughter and feels stable to be discharged home. Patient is understanding and amenable with plan, educated on red flag symptoms that would prompt immediate return. Also emphasized importance of close pcp follow-up. Discharged in stable condition.   This is a shared visit with supervising physician Dr. Matilde Sprang who has independently evaluated patient & provided guidance in evaluation/management/disposition, in agreement with care   Final Clinical Impression(s) / ED Diagnoses Final diagnoses:  Fall, initial encounter    Rx / DC Orders ED Discharge Orders     None     An After Visit Summary was printed and given to the patient.     Nestor Lewandowsky 06/27/22  2330    Teressa Lower, MD 06/28/22 1159

## 2022-06-27 NOTE — ED Triage Notes (Signed)
Pt here from home via [facility]. Pt was walking up stairs in her garage and fell backwards down 2 steps, no LOC. Pt c/o R lower back pain. No blood thinners. Pt's family witnessed all, pt has hematoma to posterior head. 160/76, 93% RA, 73HR

## 2022-06-27 NOTE — ED Notes (Signed)
2L added to support pt after giving IV fentanyl as pt is starting at 92% on RA prior to fentanyl

## 2022-06-28 NOTE — Telephone Encounter (Signed)
Register reviewed, patient was receiving lorazepam regularly through her primary care PA, Bing Matter, Last Lorazepam 2mg  47 tab on Sept 11,   Our office WILL NOT  refill her Lorazepam.

## 2022-06-29 ENCOUNTER — Emergency Department (HOSPITAL_COMMUNITY): Payer: Medicare Other

## 2022-06-29 ENCOUNTER — Observation Stay (HOSPITAL_COMMUNITY)
Admission: EM | Admit: 2022-06-29 | Discharge: 2022-07-02 | Disposition: A | Discharge status: Home / Self Care | Payer: Medicare Other | Attending: Internal Medicine | Admitting: Internal Medicine

## 2022-06-29 DIAGNOSIS — I129 Hypertensive chronic kidney disease with stage 1 through stage 4 chronic kidney disease, or unspecified chronic kidney disease: Secondary | ICD-10-CM | POA: Diagnosis not present

## 2022-06-29 DIAGNOSIS — S22028A Other fracture of second thoracic vertebra, initial encounter for closed fracture: Secondary | ICD-10-CM | POA: Insufficient documentation

## 2022-06-29 DIAGNOSIS — F03918 Unspecified dementia, unspecified severity, with other behavioral disturbance: Secondary | ICD-10-CM | POA: Diagnosis present

## 2022-06-29 DIAGNOSIS — S22038A Other fracture of third thoracic vertebra, initial encounter for closed fracture: Secondary | ICD-10-CM | POA: Diagnosis not present

## 2022-06-29 DIAGNOSIS — Z7982 Long term (current) use of aspirin: Secondary | ICD-10-CM | POA: Diagnosis not present

## 2022-06-29 DIAGNOSIS — S22000A Wedge compression fracture of unspecified thoracic vertebra, initial encounter for closed fracture: Secondary | ICD-10-CM

## 2022-06-29 DIAGNOSIS — R4182 Altered mental status, unspecified: Secondary | ICD-10-CM | POA: Diagnosis not present

## 2022-06-29 DIAGNOSIS — R410 Disorientation, unspecified: Secondary | ICD-10-CM | POA: Diagnosis present

## 2022-06-29 DIAGNOSIS — N3000 Acute cystitis without hematuria: Secondary | ICD-10-CM

## 2022-06-29 DIAGNOSIS — W19XXXA Unspecified fall, initial encounter: Secondary | ICD-10-CM

## 2022-06-29 DIAGNOSIS — N189 Chronic kidney disease, unspecified: Secondary | ICD-10-CM | POA: Insufficient documentation

## 2022-06-29 DIAGNOSIS — G9349 Other encephalopathy: Secondary | ICD-10-CM | POA: Insufficient documentation

## 2022-06-29 DIAGNOSIS — I10 Essential (primary) hypertension: Secondary | ICD-10-CM | POA: Diagnosis present

## 2022-06-29 DIAGNOSIS — I2510 Atherosclerotic heart disease of native coronary artery without angina pectoris: Secondary | ICD-10-CM | POA: Diagnosis not present

## 2022-06-29 DIAGNOSIS — Y92009 Unspecified place in unspecified non-institutional (private) residence as the place of occurrence of the external cause: Secondary | ICD-10-CM | POA: Diagnosis not present

## 2022-06-29 DIAGNOSIS — Z96642 Presence of left artificial hip joint: Secondary | ICD-10-CM | POA: Insufficient documentation

## 2022-06-29 DIAGNOSIS — S22058A Other fracture of T5-T6 vertebra, initial encounter for closed fracture: Secondary | ICD-10-CM | POA: Diagnosis not present

## 2022-06-29 DIAGNOSIS — S22048A Other fracture of fourth thoracic vertebra, initial encounter for closed fracture: Secondary | ICD-10-CM | POA: Diagnosis not present

## 2022-06-29 DIAGNOSIS — E039 Hypothyroidism, unspecified: Secondary | ICD-10-CM | POA: Insufficient documentation

## 2022-06-29 DIAGNOSIS — S22068A Other fracture of T7-T8 thoracic vertebra, initial encounter for closed fracture: Secondary | ICD-10-CM | POA: Insufficient documentation

## 2022-06-29 DIAGNOSIS — Z79899 Other long term (current) drug therapy: Secondary | ICD-10-CM | POA: Insufficient documentation

## 2022-06-29 DIAGNOSIS — W01198A Fall on same level from slipping, tripping and stumbling with subsequent striking against other object, initial encounter: Secondary | ICD-10-CM | POA: Insufficient documentation

## 2022-06-29 DIAGNOSIS — E785 Hyperlipidemia, unspecified: Secondary | ICD-10-CM | POA: Diagnosis present

## 2022-06-29 LAB — COMPREHENSIVE METABOLIC PANEL
ALT: 23 U/L (ref 0–44)
AST: 26 U/L (ref 15–41)
Albumin: 3.9 g/dL (ref 3.5–5.0)
Alkaline Phosphatase: 108 U/L (ref 38–126)
Anion gap: 8 (ref 5–15)
BUN: 20 mg/dL (ref 8–23)
CO2: 25 mmol/L (ref 22–32)
Calcium: 9.5 mg/dL (ref 8.9–10.3)
Chloride: 104 mmol/L (ref 98–111)
Creatinine, Ser: 0.71 mg/dL (ref 0.44–1.00)
GFR, Estimated: >60 mL/min (ref >60)
Glucose, Bld: 144 mg/dL — ABNORMAL HIGH (ref 70–99)
Potassium: 3.5 mmol/L (ref 3.5–5.1)
Sodium: 137 mmol/L (ref 135–145)
Total Bilirubin: 0.6 mg/dL (ref 0.3–1.2)
Total Protein: 7 g/dL (ref 6.5–8.1)

## 2022-06-29 LAB — CBC WITH DIFFERENTIAL/PLATELET
Abs Immature Granulocytes: 0.07 10*3/uL (ref 0.00–0.07)
Basophils Absolute: 0 10*3/uL (ref 0.0–0.1)
Basophils Relative: 0 %
Eosinophils Absolute: 0.2 10*3/uL (ref 0.0–0.5)
Eosinophils Relative: 3 %
HCT: 41.1 % (ref 36.0–46.0)
Hemoglobin: 13.6 g/dL (ref 12.0–15.0)
Immature Granulocytes: 1 %
Lymphocytes Relative: 13 %
Lymphs Abs: 1 10*3/uL (ref 0.7–4.0)
MCH: 31.9 pg (ref 26.0–34.0)
MCHC: 33.1 g/dL (ref 30.0–36.0)
MCV: 96.5 fL (ref 80.0–100.0)
Monocytes Absolute: 0.5 10*3/uL (ref 0.1–1.0)
Monocytes Relative: 6 %
Neutro Abs: 5.8 10*3/uL (ref 1.7–7.7)
Neutrophils Relative %: 77 %
Platelets: 165 10*3/uL (ref 150–400)
RBC: 4.26 MIL/uL (ref 3.87–5.11)
RDW: 12.4 % (ref 11.5–15.5)
WBC: 7.5 10*3/uL (ref 4.0–10.5)
nRBC: 0 % (ref 0.0–0.2)

## 2022-06-29 LAB — ETHANOL: Alcohol, Ethyl (B): <10 mg/dL (ref <10)

## 2022-06-29 MED ORDER — ASPIRIN 81 MG PO TBEC
81.0000 mg | DELAYED_RELEASE_TABLET | Freq: Every day | ORAL | Status: DC
  Administered 2022-06-30 – 2022-07-02 (×3): 81 mg via ORAL
  Filled 2022-06-29 (×3): qty 1

## 2022-06-29 MED ORDER — ARIPIPRAZOLE 2 MG PO TABS
2.0000 mg | ORAL_TABLET | Freq: Every day | ORAL | Status: DC
  Filled 2022-06-29: qty 1

## 2022-06-29 MED ORDER — ACETAMINOPHEN 325 MG PO TABS
650.0000 mg | ORAL_TABLET | ORAL | Status: DC | PRN
  Administered 2022-06-30 – 2022-07-02 (×4): 650 mg via ORAL
  Filled 2022-06-29 (×5): qty 2

## 2022-06-29 MED ORDER — FERROUS SULFATE 325 (65 FE) MG PO TABS
325.0000 mg | ORAL_TABLET | Freq: Every day | ORAL | Status: DC
  Administered 2022-06-30 – 2022-07-02 (×3): 325 mg via ORAL
  Filled 2022-06-29 (×3): qty 1

## 2022-06-29 MED ORDER — LEVOTHYROXINE SODIUM 75 MCG PO TABS
75.0000 ug | ORAL_TABLET | Freq: Every day | ORAL | Status: DC
  Administered 2022-06-30 – 2022-07-02 (×3): 75 ug via ORAL
  Filled 2022-06-29 (×3): qty 1

## 2022-06-29 MED ORDER — ISOSORBIDE MONONITRATE ER 60 MG PO TB24
60.0000 mg | ORAL_TABLET | Freq: Every day | ORAL | Status: DC
  Administered 2022-06-30 – 2022-07-02 (×3): 60 mg via ORAL
  Filled 2022-06-29 (×3): qty 1

## 2022-06-29 MED ORDER — SERTRALINE HCL 100 MG PO TABS
200.0000 mg | ORAL_TABLET | Freq: Every day | ORAL | Status: DC
  Administered 2022-06-30 – 2022-07-02 (×3): 200 mg via ORAL
  Filled 2022-06-29 (×2): qty 2
  Filled 2022-06-29: qty 4

## 2022-06-29 MED ORDER — LIDOCAINE 5 % EX PTCH
1.0000 | MEDICATED_PATCH | CUTANEOUS | Status: DC
  Administered 2022-06-29 – 2022-07-01 (×3): 1 via TRANSDERMAL
  Filled 2022-06-29 (×4): qty 1

## 2022-06-29 MED ORDER — MEMANTINE HCL 10 MG PO TABS
10.0000 mg | ORAL_TABLET | Freq: Two times a day (BID) | ORAL | Status: DC
  Administered 2022-06-30 – 2022-07-02 (×6): 10 mg via ORAL
  Filled 2022-06-29: qty 2
  Filled 2022-06-29 (×4): qty 1
  Filled 2022-06-29: qty 2

## 2022-06-29 MED ORDER — VITAMIN D 25 MCG (1000 UNIT) PO TABS
5000.0000 [IU] | ORAL_TABLET | Freq: Every day | ORAL | Status: DC
  Administered 2022-06-30 – 2022-07-02 (×3): 5000 [IU] via ORAL
  Filled 2022-06-29 (×3): qty 5

## 2022-06-29 MED ORDER — QUETIAPINE FUMARATE 50 MG PO TABS
25.0000 mg | ORAL_TABLET | Freq: Every day | ORAL | Status: DC
  Administered 2022-06-30: 25 mg via ORAL
  Filled 2022-06-29: qty 1

## 2022-06-29 MED ORDER — ADULT MULTIVITAMIN W/MINERALS CH
1.0000 | ORAL_TABLET | Freq: Every day | ORAL | Status: DC
  Administered 2022-06-30 – 2022-07-02 (×3): 1 via ORAL
  Filled 2022-06-29 (×3): qty 1

## 2022-06-29 MED ORDER — LORAZEPAM 1 MG PO TABS
1.0000 mg | ORAL_TABLET | Freq: Every evening | ORAL | Status: DC
  Administered 2022-06-30 – 2022-07-01 (×2): 1 mg via ORAL
  Filled 2022-06-29 (×3): qty 1

## 2022-06-29 MED ORDER — ACETAMINOPHEN 325 MG PO TABS
650.0000 mg | ORAL_TABLET | Freq: Once | ORAL | Status: AC
  Administered 2022-06-29: 650 mg via ORAL
  Filled 2022-06-29: qty 2

## 2022-06-29 MED ORDER — LIDOCAINE 5 % EX PTCH: 1.0000 | MEDICATED_PATCH | CUTANEOUS | Status: DC

## 2022-06-29 MED ORDER — SIMVASTATIN 40 MG PO TABS
40.0000 mg | ORAL_TABLET | Freq: Every day | ORAL | Status: DC
  Administered 2022-06-30 – 2022-07-01 (×3): 40 mg via ORAL
  Filled 2022-06-29: qty 1
  Filled 2022-06-29: qty 2
  Filled 2022-06-29: qty 1

## 2022-06-29 MED ORDER — SERTRALINE HCL 50 MG PO TABS: 200.0000 mg | ORAL_TABLET | ORAL | Status: DC

## 2022-06-29 MED ORDER — LEVOTHYROXINE SODIUM 50 MCG PO TABS: 75.0000 ug | ORAL_TABLET | Freq: Every day | ORAL | Status: DC

## 2022-06-29 NOTE — ED Provider Notes (Signed)
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 her age and fall, she will have CT head, C-spine and T-spine as well as chest x-ray to evaluate for acute traumatic injury.  She will given Tylenol and lidocaine patch for pain.  Plan will be to speak with her family pending her work-up to determine safe disposition is the patient reports she lives alone at home and has been seen twice in the last week for falls in the emergency department.  Independent labs interpretation:  The following labs were independently interpreted: within normal range  Independent visualization of imaging: - I independently visualized the following imaging with scope of interpretation limited to determining acute life threatening conditions related to emergency care: CTH/c-spine/t-spine, CXR, which revealed Multiple thoracic spine compression fractures  Consultation: - Consulted or discussed management/test interpretation w/ external professional: TTS  Consideration for  admission or further workup: Patient requires further work up and evaluation for her agitation 2/2 uncontrolled dementia Social Determinants of health: N/A    Amount and/or Complexity of Data Reviewed Labs: ordered. Radiology: ordered.  Risk OTC drugs. Prescription drug management.          Final Clinical Impression(s) / ED Diagnoses Final diagnoses:  Fall, initial encounter  Compression fracture of thoracic vertebra, unspecified thoracic vertebral level, initial  encounter [facility])  Dementia with behavioral disturbance [facility])    Rx / DC Orders ED Discharge Orders     None         Rexford Maus, DO 06/29/22 1931

## 2022-06-29 NOTE — ED Triage Notes (Signed)
Ems brings pt in from home for a fall. Denies LOC. Family reports pt has been aggressive at home. Pt reports a headache and bilateral neck pain.

## 2022-06-29 NOTE — Telephone Encounter (Signed)
Pt's daughter is calling. Stated she need a nurse to give her a call. [name] they are on the way to hospital to have Pt committed. Pt is refusing to take medication and started throwing cups at her daughter.

## 2022-06-29 NOTE — Telephone Encounter (Signed)
Pt's daughter has called stating that on Friday she spoke with the On Call Dr re:pt.  Daughter states they have had to call police and EMS for pt.  Pt has turned violent, won't eat, family having to put chairs at doors to prevent pt from being able to get out of house.  Daughter states when she spoke with On call she was told to go from 1 to 2 SEROQUEL, daughter states it is very difficult to get pt to eat.  Daughter is wanting to know if anything else can be called in to help in keeping pt calm, please call.

## 2022-06-29 NOTE — Telephone Encounter (Signed)
I returned [name]'s call. She reports pt is still having behvioral issues. She not is refusing to take her medications, eat, or drink fluids. She is on her way to the ER for evaluation. Pt has been scheduled for f/u 07/13/2022 along with added to wait list.  We will try and get her in sooner if feasible.

## 2022-06-30 ENCOUNTER — Encounter (HOSPITAL_COMMUNITY): Payer: Self-pay | Admitting: Internal Medicine

## 2022-06-30 ENCOUNTER — Other Ambulatory Visit: Payer: Self-pay

## 2022-06-30 DIAGNOSIS — F03918 Unspecified dementia, unspecified severity, with other behavioral disturbance: Secondary | ICD-10-CM

## 2022-06-30 DIAGNOSIS — R4182 Altered mental status, unspecified: Secondary | ICD-10-CM | POA: Diagnosis present

## 2022-06-30 DIAGNOSIS — R410 Disorientation, unspecified: Secondary | ICD-10-CM

## 2022-06-30 LAB — URINALYSIS, ROUTINE W REFLEX MICROSCOPIC
Bilirubin Urine: NEGATIVE
Glucose, UA: NEGATIVE mg/dL
Hgb urine dipstick: NEGATIVE
Ketones, ur: NEGATIVE mg/dL
Nitrite: NEGATIVE
Protein, ur: NEGATIVE mg/dL
Specific Gravity, Urine: 1.025 (ref 1.005–1.030)
pH: 5 (ref 5.0–8.0)

## 2022-06-30 LAB — RAPID URINE DRUG SCREEN, HOSP PERFORMED
Amphetamines: NOT DETECTED
Barbiturates: NOT DETECTED
Benzodiazepines: POSITIVE — AB
Cocaine: NOT DETECTED
Opiates: NOT DETECTED
Tetrahydrocannabinol: NOT DETECTED

## 2022-06-30 MED ORDER — FOSFOMYCIN TROMETHAMINE 3 G PO PACK: 3.0000 g | PACK | Freq: Once | ORAL | Status: DC

## 2022-06-30 MED ORDER — QUETIAPINE FUMARATE 25 MG PO TABS
25.0000 mg | ORAL_TABLET | Freq: Two times a day (BID) | ORAL | Status: DC
  Administered 2022-06-30 – 2022-07-01 (×2): 25 mg via ORAL
  Filled 2022-06-30 (×2): qty 1

## 2022-06-30 MED ORDER — DOXAZOSIN MESYLATE 4 MG PO TABS
8.0000 mg | ORAL_TABLET | Freq: Every day | ORAL | Status: DC
  Administered 2022-06-30 – 2022-07-02 (×3): 8 mg via ORAL
  Filled 2022-06-30 (×3): qty 2

## 2022-06-30 MED ORDER — QUETIAPINE FUMARATE 25 MG PO TABS
12.5000 mg | ORAL_TABLET | Freq: Every day | ORAL | Status: DC
  Administered 2022-06-30: 12.5 mg via ORAL
  Filled 2022-06-30: qty 1

## 2022-06-30 MED ORDER — QUETIAPINE FUMARATE 50 MG PO TABS
50.0000 mg | ORAL_TABLET | Freq: Two times a day (BID) | ORAL | Status: DC
  Administered 2022-06-30: 50 mg via ORAL
  Filled 2022-06-30: qty 1

## 2022-06-30 MED ORDER — ENOXAPARIN SODIUM 40 MG/0.4ML IJ SOSY
40.0000 mg | PREFILLED_SYRINGE | INTRAMUSCULAR | Status: DC
  Administered 2022-06-30 – 2022-07-01 (×2): 40 mg via SUBCUTANEOUS
  Filled 2022-06-30 (×2): qty 0.4

## 2022-06-30 MED ORDER — QUETIAPINE FUMARATE 25 MG PO TABS: 12.5000 mg | ORAL_TABLET | Freq: Every day | ORAL | Status: DC

## 2022-06-30 MED ORDER — SODIUM CHLORIDE 0.9 % IV SOLN
1.0000 g | Freq: Once | INTRAVENOUS | Status: AC
  Administered 2022-06-30: 1 g via INTRAVENOUS
  Filled 2022-06-30: qty 10

## 2022-06-30 MED ORDER — SODIUM CHLORIDE 0.9 % IV SOLN: INTRAVENOUS | Status: DC

## 2022-06-30 NOTE — ED Notes (Signed)
Pt's lunch has arrived, pt was sat up by Probation officer. Pt currently eating her lunch.

## 2022-06-30 NOTE — ED Notes (Signed)
RN attempted to give pt medicine, pt snatched medicine cup and began being verbally aggressive towards RN. Writer has been with pt since 0700, today, and has noticed a complete change in patient. Pt has become verbally aggressive, agitated and occassionally whispering to herself about "Marcie Bal". Pt early in the morning has been cooperative, shy and very positive but has been noticed from a decline. Both daughters had been with pt most of day and before leaving, they stated that pt will "sundown almost the entire day. This is how she is during sundowning".

## 2022-06-30 NOTE — ED Notes (Signed)
Pt walked to the BR without any issues

## 2022-06-30 NOTE — ED Notes (Signed)
Pt walked with assistance to the BR. Pt was not able to void, writer encouraged pt to stay on the toilet for a bit longer due to urine specimen needed. Pt couldn't void, pt walked back to stretcher without any problem.

## 2022-06-30 NOTE — Consult Note (Addendum)
[facility] Face-to-Face Psychiatry Consult   Reason for Consult:  Aggressive Behavior and Medication Management.  Referring Physician:  EPD Patient Identification: [name] MRN:  [id number] Principal Diagnosis: Dementia with behavioral disturbance [facility]) Diagnosis:  Principal Problem:   Dementia with behavioral disturbance (HCC)   Total Time spent with patient: 15 minutes  Subjective:   [name] is a 81 y.o. female female that was seen and evaluated face-to-face by this provider and nurse practitioner.  Patient was asked reason for this admission.  She reports "I had a fall".  Denies suicidal or homicidal ideations.  Denies auditory or  visual hallucinations.  She is awake,alert and oriented x3.  Has been pleasant, cooperative during this assessment.  Was reported patient has been medication compliant since her admission.  Was reported patient has a charted history with dementia and has become more aggressive..   Adjustments made to current medication regimen.  Discontinued Abilify 2 mg.  Patient to start Seroquel 12.5 mg twice daily.  NP spoke to patient's daughter Corrie Dandy who was at bedside.  She reports patient has a follow-up appointment with neurology October 13 th. "  I was told that she would go to a geriatric psychiatric facility."  Education provided with dementia with behavior disturbances will follow-up with CSW for additional outpatient resources.  Patient to be cleared by psychiatry.  HPI:  Per admission assessment note:" Patient is an 81 year old female with a past medical history of hypertension and dementia presenting to the emergency department after a fall.  Patient states that she was outside at her carport and got into an argument with her neighbors.  She states that she was trying to move a chair around and ended up tripping and falling on her back.  She states that she did injure her back after a fall a few days ago.  She states that she occasionally uses a cane to get  around.  She denies any numbness or weakness.  She denies any headaches, nausea or vomiting.  She denies any lightheadedness, chest pain or shortness of breath prior to the fall."  Past Psychiatric History:   Risk to Self:   Risk to Others:   Prior Inpatient Therapy:   Prior Outpatient Therapy:    Past Medical History:  Past Medical History:  Diagnosis Date   Arthritis    Chest pain    Chronic back pain    "neurostimulator electrode leads overlie the lower thoracic spinal canal" - per cxr report 07/10/14   Chronic kidney disease    kidney stone   Chronic UTI (urinary tract infection)    Coronary artery disease    NONOBSTRUCTIVE   Depression    Dizziness    GERD (gastroesophageal reflux disease)    Headache    History of kidney stones    Hyperlipidemia    Hypertension    Left foot drop    SINCE 2009 - RELATED TO BACK PROBLEM - WEARS BRACE   PONV (postoperative nausea and vomiting)    Spinal stenosis    Thyroid disease    hypothryoid    Past Surgical History:  Procedure Laterality Date   BACK SURGERY     CARDIAC CATHETERIZATION  02/22/2009   EF 60%   CATARACT EXTRACTION     both eyes   COLONOSCOPY     CYSTOSCOPY     FOR KIDNEY STONE   LUMBAR LAMINECTOMY/DECOMPRESSION MICRODISCECTOMY     TOTAL HIP ARTHROPLASTY Left 07/17/2014   Procedure: LEFT TOTAL HIP ARTHROPLASTY ANTERIOR  APPROACH;  Surgeon: Shelda Pal, MD;  Location: [facility];  Service: Orthopedics;  Laterality: Left;   Family History:  Family History  Problem Relation Age of Onset   Cancer Mother        started in gallbladder then spread to liver   Heart attack Father    Colon cancer Neg Hx    Esophageal cancer Neg Hx    Rectal cancer Neg Hx    Stomach cancer Neg Hx    Family Psychiatric  History:  Social History:  Social History   Substance and Sexual Activity  Alcohol Use No   Alcohol/week: 0.0 standard drinks of alcohol     Social History   Substance and Sexual Activity  Drug Use No     Social History   Socioeconomic History   Marital status: Widowed    Spouse name: Not on file   Number of children: 2   Years of education: 12   Highest education level: High school graduate  Occupational History   Occupation: Retired  Tobacco Use   Smoking status: Never   Smokeless tobacco: Never  Vaping Use   Vaping Use: Never used  Substance and Sexual Activity   Alcohol use: No    Alcohol/week: 0.0 standard drinks of alcohol   Drug use: No   Sexual activity: Not on file  Other Topics Concern   Not on file  Social History Narrative   Lives with her granddaughter.   Left-handed.   No caffeine use.   Social Determinants of Health   Financial Resource Strain: Not on file  Food Insecurity: Not on file  Transportation Needs: Not on file  Physical Activity: Not on file  Stress: Not on file  Social Connections: Not on file   Additional Social History:    Allergies:   Allergies  Allergen Reactions   Nsaids Other (See Comments)    GI upset    Labs:  Results for orders placed or performed during the hospital encounter of 06/29/22 (from the past 48 hour(s))  Comprehensive metabolic panel     Status: Abnormal   Collection Time: 06/29/22  5:08 PM  Result Value Ref Range   Sodium 137 135 - 145 mmol/L   Potassium 3.5 3.5 - 5.1 mmol/L   Chloride 104 98 - 111 mmol/L   CO2 25 22 - 32 mmol/L   Glucose, Bld 144 (H) 70 - 99 mg/dL    Comment: Glucose reference range applies only to samples taken after fasting for at least 8 hours.   BUN 20 8 - 23 mg/dL   Creatinine, Ser 6.44 0.44 - 1.00 mg/dL   Calcium 9.5 8.9 - 03.4 mg/dL   Total Protein 7.0 6.5 - 8.1 g/dL   Albumin 3.9 3.5 - 5.0 g/dL   AST 26 15 - 41 U/L   ALT 23 0 - 44 U/L   Alkaline Phosphatase 108 38 - 126 U/L   Total Bilirubin 0.6 0.3 - 1.2 mg/dL   GFR, Estimated >74 >25 mL/min    Comment: (NOTE) Calculated using the CKD-EPI Creatinine Equation (2021)    Anion gap 8 5 - 15    Comment: Performed at [facility], [address], [place], [place] [address]  CBC with Diff     Status: None   Collection Time: 06/29/22  5:08 PM  Result Value Ref Range   WBC 7.5 4.0 - 10.5 K/uL   RBC 4.26 3.87 - 5.11 MIL/uL   Hemoglobin 13.6 12.0 -  15.0 g/dL   HCT 16.141.1 09.636.0 - 04.546.0 %   MCV 96.5 80.0 - 100.0 fL   MCH 31.9 26.0 - 34.0 pg   MCHC 33.1 30.0 - 36.0 g/dL   RDW 40.912.4 81.111.5 - 91.415.5 %   Platelets 165 150 - 400 K/uL   nRBC 0.0 0.0 - 0.2 %   Neutrophils Relative % 77 %   Neutro Abs 5.8 1.7 - 7.7 K/uL   Lymphocytes Relative 13 %   Lymphs Abs 1.0 0.7 - 4.0 K/uL   Monocytes Relative 6 %   Monocytes Absolute 0.5 0.1 - 1.0 K/uL   Eosinophils Relative 3 %   Eosinophils Absolute 0.2 0.0 - 0.5 K/uL   Basophils Relative 0 %   Basophils Absolute 0.0 0.0 - 0.1 K/uL   Immature Granulocytes 1 %   Abs Immature Granulocytes 0.07 0.00 - 0.07 K/uL    Comment: Performed at [facility], [address] [place], [place] [address]  Ethanol     Status: None   Collection Time: 06/29/22  5:29 PM  Result Value Ref Range   Alcohol, Ethyl (B) <10 <10 mg/dL    Comment: (NOTE) Lowest detectable limit for serum alcohol is 10 mg/dL.  For medical purposes only. Performed at [facility], [address] [place], [place] [address]     Current Facility-Administered Medications  Medication Dose Route Frequency Provider Last Rate Last Admin   acetaminophen (TYLENOL) tablet 650 mg  650 mg Oral Q4H PRN Rexford MausKingsley, Victoria K, DO   650 mg at 06/30/22 1005   aspirin EC tablet 81 mg  81 mg Oral Daily Rexford MausKingsley, Victoria K, DO   81 mg at 06/30/22 1003   cholecalciferol (VITAMIN D3) 25 MCG (1000 UNIT) tablet 5,000 Units  5,000 Units Oral Daily Elayne SnareKingsley, Victoria K, DO   5,000 Units at 06/30/22 1004   ferrous sulfate tablet 325 mg  325 mg Oral Daily Elayne SnareKingsley, Victoria K, DO   325 mg at 06/30/22 1004   isosorbide mononitrate (IMDUR) 24 hr tablet 60 mg  60 mg Oral Daily Kingsley,  Victoria K, DO       levothyroxine (SYNTHROID) tablet 75 mcg  75 mcg Oral Q0600 Elayne SnareKingsley, Victoria K, DO   75 mcg at 06/30/22 0700   lidocaine (LIDODERM) 5 % 1 patch  1 patch Transdermal Q24H Elayne SnareKingsley, Victoria K, DO   1 patch at 06/29/22 1502   LORazepam (ATIVAN) tablet 1 mg  1 mg Oral QPM Elayne SnareKingsley, Victoria K, DO   1 mg at 06/30/22 0004   memantine (NAMENDA) tablet 10 mg  10 mg Oral BID Elayne SnareKingsley, Victoria K, DO   10 mg at 06/30/22 1004   multivitamin with minerals tablet 1 tablet  1 tablet Oral Daily Elayne SnareKingsley, Victoria K, DO   1 tablet at 06/30/22 1004   QUEtiapine (SEROQUEL) tablet 12.5 mg  12.5 mg Oral Daily Oneta RackLewis, Vianna Venezia N, NP       QUEtiapine (SEROQUEL) tablet 12.5 mg  12.5 mg Oral QHS Oneta RackLewis, Merik Mignano N, NP       sertraline (ZOLOFT) tablet 200 mg  200 mg Oral Daily Kingsley, Victoria K, DO   200 mg at 06/30/22 1004   simvastatin (ZOCOR) tablet 40 mg  40 mg Oral QHS Kingsley, Victoria K, DO   40 mg at 06/30/22 0005   Current Outpatient Medications  Medication Sig Dispense Refill   acetaminophen (TYLENOL) 500 MG tablet Take 500 mg by mouth every 6 (six) hours as needed for moderate  pain.     ARIPiprazole (ABILIFY) 2 MG tablet TAKE 1 TABLET BY MOUTH EVERY DAY (Patient taking differently: Take 2 mg by mouth daily.) 90 tablet 2   aspirin 81 MG tablet Take 81 mg by mouth daily.     Cholecalciferol (VITAMIN D) 125 MCG (5000 UT) CAPS Take 5,000 Units by mouth daily.     doxazosin (CARDURA) 8 MG tablet Take 8 mg by mouth daily.     ferrous sulfate 324 MG TBEC Take 324 mg by mouth.     fluticasone (FLONASE) 50 MCG/ACT nasal spray Place 1 spray into both nostrils daily as needed for allergies or rhinitis.     isosorbide mononitrate (IMDUR) 60 MG 24 hr tablet Take 60 mg by mouth daily.     levothyroxine (SYNTHROID) 75 MCG tablet Take 75 mcg by mouth daily.     LORazepam (ATIVAN) 2 MG tablet Take 0.5 tablets (1 mg total) by mouth every evening. (Patient taking differently: Take 1 mg by mouth in the  morning and at bedtime.) 15 tablet 0   meclizine (ANTIVERT) 25 MG tablet Take 1 tablet (25 mg total) by mouth 3 (three) times daily as needed for dizziness. 30 tablet 0   memantine (NAMENDA) 10 MG tablet TAKE 1 TABLET BY MOUTH TWICE A DAY 180 tablet 3   Multiple Vitamin (MULTIVITAMIN) tablet Take 1 tablet by mouth every morning.     nitrofurantoin (MACRODANTIN) 100 MG capsule Take 100 mg by mouth daily.     ondansetron (ZOFRAN-ODT) 8 MG disintegrating tablet Take 1 tablet (8 mg total) by mouth every 8 (eight) hours as needed for nausea or vomiting. 20 tablet 0   QUEtiapine (SEROQUEL) 25 MG tablet Take 1 tablet (25 mg total) by mouth at bedtime. (Patient taking differently: Take 25 mg by mouth 2 (two) times daily.) 90 tablet 3   sertraline (ZOLOFT) 100 MG tablet Take 200 mg by mouth daily.     simvastatin (ZOCOR) 40 MG tablet Take 40 mg by mouth at bedtime.     ciprofloxacin (CIPRO) 250 MG tablet Take 250 mg by mouth 2 (two) times daily. (Patient not taking: Reported on 06/29/2022)     lidocaine (LIDODERM) 5 % Place 1 patch onto the skin daily. Remove & Discard patch within 12 hours or as directed by MD (Patient not taking: Reported on 06/29/2022) 30 patch 0    Musculoskeletal:   Psychiatric Specialty Exam:  Presentation  General Appearance: No data recorded Eye Contact:No data recorded Speech:No data recorded Speech Volume:No data recorded Handedness:No data recorded  Mood and Affect  Mood:No data recorded Affect:No data recorded  Thought Process  Thought Processes:No data recorded Descriptions of Associations:No data recorded Orientation:No data recorded Thought Content:No data recorded History of Schizophrenia/Schizoaffective disorder:No  Duration of Psychotic Symptoms:Less than six months  Hallucinations:No data recorded Ideas of Reference:No data recorded Suicidal Thoughts:No data recorded Homicidal Thoughts:No data recorded  Sensorium  Memory:No data  recorded Judgment:No data recorded Insight:No data recorded  Executive Functions  Concentration:No data recorded Attention Span:No data recorded Recall:No data recorded Fund of Knowledge:No data recorded Language:No data recorded  Psychomotor Activity  Psychomotor Activity:No data recorded  Assets  Assets:No data recorded  Sleep  Sleep:No data recorded  Physical Exam: Physical Exam Vitals and nursing note reviewed.  Cardiovascular:     Rate and Rhythm: Normal rate and regular rhythm.  Neurological:     Mental Status: She is alert and oriented to person, place, and time.  Psychiatric:  Mood and Affect: Mood normal.        Behavior: Behavior normal.        Thought Content: Thought content normal.    Review of Systems  Cardiovascular: Negative.   Psychiatric/Behavioral:  The patient is nervous/anxious.   All other systems reviewed and are negative.  Blood pressure 124/83, pulse 82, temperature 98.7 F (37.1 C), temperature source Oral, resp. rate 18, height 5\' 4"  (1.626 m), weight 52.2 kg, SpO2 91 %. Body mass index is 19.74 kg/m.  Treatment Plan Summary: Daily contact with patient to assess and evaluate symptoms and progress in treatment and Medication management  -[facility] consult for additional outpatient resources for dementia/neurology follow-up -Adjustments made to current medication regimen.  Discontinued Abilify 2 mg -Initiated Seroquel 12.5 mg p.o. twice daily -Continue Namenda 10 mg p.o. twice daily  Disposition: No evidence of imminent risk to self or others at present.   Patient does not meet criteria for psychiatric inpatient admission. Supportive therapy provided about ongoing stressors. Refer to IOP. Discussed crisis plan, support from social network, calling 911, coming to the Emergency Department, and calling Suicide Hotline.  Derrill [place], NP 06/30/2022 10:37 AM

## 2022-06-30 NOTE — ED Notes (Signed)
Pt's breakfast tray has arrived, writer sat pt up. Pt eating her breakfast now.

## 2022-06-30 NOTE — ED Notes (Signed)
MD at bedside. 

## 2022-06-30 NOTE — Progress Notes (Signed)
Transition of Care [facility]) - Emergency Department Mini Assessment   Patient Details  Name: [name] MRN: [id number] Date of Birth: Jan 08, 1941  [facility]) CM/SW Contact:    Kimber Relic, LCSW Phone Number: 06/30/2022, 1:24 PM   Clinical Narrative: TOC was consulted to provide the family with outpatient resources for Dementia and Neurology follow up. This CSW contacted the pt's daughter, [name], who informed that she is not interested in any resources and that her mother is already established with a neurologist. [name] mentioned that her mother "will not eat at home for [place]" and is concerned with her medications and thinking they need to be "bumped up." This CSW informed [name] that TOC will escalate medication concerns to the physician and psychiatric NP. This CSW made Dr. Armandina Gemma, and Ricky Ala, NP, aware via secure chat. Per [name], as of 1:22 PM, Dr.Lawsing has gone to speak with her and informed that the pt may have a uti. [name] apologized for frustration and stated she "is just tired." This CSW did confirm with [name] whether she would like resources for dementia including outpatient services or placement assistance and [name] declined. [name] stated they will continue to care for her in the home and is just wanting to be sure her mother "is eating to stay alive." TOC signing off.    ED Mini Assessment: What brought you to the Emergency Department? 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 [place] [address] Phone: [phone] Fax: [place] [id number] [place], [place] - [place] [address] Phone: [phone] Fax: [place] [address] Phone: [phone] Fax: [phone]

## 2022-06-30 NOTE — ED Provider Notes (Addendum)
Emergency Medicine Observation Re-evaluation Note  [name] is a 81 y.o. female, seen on rounds today.  Pt initially presented to the ED for complaints of Fall Currently, the patient is alert, resting comfortably.  Physical Exam  BP (!) 141/92 (BP Location: Left Arm)   Pulse 66   Temp 98.5 F (36.9 C) (Oral)   Resp 16   Ht 5\' 4"  (1.626 m)   Wt 52.2 kg   SpO2 91%   BMI 19.74 kg/m  Physical Exam General: NAD Cardiac: well perfused Lungs: even and unlabored Psych: no agitation  ED Course / MDM  EKG:EKG Interpretation  Date/Time:  Monday June 29 2022 18:26:30 EDT Ventricular Rate:  72 PR Interval:  152 QRS Duration: 80 QT Interval:  398 QTC Calculation: 435 R Axis:   -16 Text Interpretation: Normal sinus rhythm Normal ECG No significant change since last tracing Confirmed by Leanord Asal ([id number]) on 06/29/2022 6:44:18 PM  I have reviewed the labs performed to date as well as medications administered while in observation.  Recent changes in the last 24 hours include pt with hx of dementia presenting after a fall. Agitated and aggressive, refusing to take home medications, IVC'd by family for this. Per documentation, psychiatry recommend observation and re-evaluation this AM. Pt may need geri-psych placement pending repeat assessment.  Additionally, patient presenting with more agitation in the setting of dementia. On review of her labs, a UA has not yet been performed. Nursing engaged to obtain urine via catheterized specimen.    Plan  Current plan is for Re-evaluation by psychiatry, dispo pending, remains under IVC.    [phone] On repeat assessment, psychiatry states that patient would not be a candidate for geri-psych. Currently engaging with family regarding dispo plan.  80 I spoke with family members bedside. They are NOT comfortable with her going home in her current condition. She has not been eating for several days and she has become more combative with  regards to her dementia. Additionally, the patient should be on Seroquel 50mg  bid, a dose that was recently changed by her Neurologist this past Friday. Med list updated. The patient will likely need further coordination between family and social work for placement in order to obtain a safe disposition.     1605 Urinalysis with moderate leukocytes, 21-50 WBCs, and rare bacteria. The patient complains of back discomfort. Family states that she does get more confused with UTIs and often complains of back discomfort. Will administer Rocephin and consult hospitalist for admission due to altered mental status in the setting of a UTI. Pt would benefit from PT/OT, and potential SNF placement.    Regan Lemming, MD 06/30/22 1607    Regan Lemming, MD 06/30/22 [phone]

## 2022-06-30 NOTE — ED Notes (Signed)
Pt's dinner has arrived, pt currently sleeping. Writer will warm pt's food when pt is awake

## 2022-06-30 NOTE — Progress Notes (Signed)
Pt has been psych cleared per Ricky Ala, NP. This CSW will remove pt from the [facility] shift report. TOC will assist with discharge needs.  Benjaman Kindler, MSW, [facility] 06/30/2022 3:56 PM

## 2022-06-30 NOTE — Plan of Care (Signed)

## 2022-06-30 NOTE — Telephone Encounter (Signed)
Pt's [name]; mother was admitted to the hospital. Doctor here want her take Seroquel 12.5 in am, 12.5 pm.  But I want to do what was recommended by [facility] ;50 mg per day (25 mg am and 25 mg pm). Can not get her to eat in order for her to take the medication. She is being discharged, need someone  Would like a call from the nurse.

## 2022-06-30 NOTE — ED Notes (Signed)
Pt's daughter visiting pt

## 2022-06-30 NOTE — ED Notes (Signed)
Pt's daughter has left 2 bags with patient. A pink bag and a blue bag, that contains her personal belongings due to pt staying for a couple of nights in the hospital. Pt currently sleeping, writer left pt's bags at bedside

## 2022-06-30 NOTE — ED Notes (Signed)
MD talking to family

## 2022-06-30 NOTE — BH Assessment (Signed)
Comprehensive Clinical Assessment (CCA) Note  06/30/2022 [name] [id number]  DISPOSITION: Gave clinical report to Chinwendu Rockney Ghee, NP who recommended Pt be observed and evaluated by psychiatry in the morning. Notified Elayne Snare, DO and Baker Pierini, RN of recommendation via secure message.  The patient demonstrates the following risk factors for suicide: Chronic risk factors for suicide include: medical illness dementia . Acute risk factors for suicide include: loss (financial, interpersonal, professional). Protective factors for this patient include: positive social support, responsibility to others (children, family), hope for the future, and religious beliefs against suicide. Considering these factors, the overall suicide risk at this point appears to be low. Patient is appropriate for outpatient follow up.  Flowsheet Row ED from 06/29/2022 in [facility] from 06/27/2022 in [facility] EMERGENCY [facility]PART[facility] ED from 06/16/2022 in [facility]  C-SSRS RISK CATEGORY No Risk No Risk No Risk      Pt is a 81 year old widowed female who presents unaccompanied to [facility] ED via EMS after falling at home. Pt's daughter, Beverely Pace [phone], petitioned for involuntary commitment. Affidavit and petition states: "Petitioner states: the respondent has been diagnosed with dementia. Refused to take her medication for the last few days. The respondent will not eat, tend to her personal hygiene and does not always sleep period the respondent has become very aggressive, she will become angry and attempt to fight her caretakers. The respondent will become so angry she will throw items, this has caused her to fall four times within the past three weeks. The respondent does not believe she is living in her own home, she will leave the home walking (even though she needs assistance of a walker). The  respondent makes up scenarios in her mind that are not real but she believes they are real."  Pt says she is very upset because there are people in her neighborhood selling and using drugs. She says there is a woman who is in the house where she is staying who is using drugs but she is nice and calls her "Mom." She perseverates on people using drugs and how they bother her. She acknowledges feeling fearful and anxious. She reports crying spells, difficulty concentrating, irritability, poor sleep, and poor appetite. She denies current suicidal ideation or history of suicide attempts. She denies thoughts of harming others. She denies hallucinations. She denies alcohol or other substance use.  Pt says her husband died six years ago. She talks about moving from one house to another but seems uncertain where she lives or who is living in her current household. At some points she talks about living with her daughter. She says she has 11 siblings and they [place] farm. She says she had a good childhood. She says some of her siblings have died. She states she has a conflict with one of her sisters but her story is confused.   TTS spoke with Pt's daughter/petitioner, [name] Sims [phone]. She says Pt was diagnosed with dementia two and a half year ago and previously had no history of mental health problems. She never used alcohol or substances. She says Pt had an episode of aggression previously and was prescribed Seroquel. Ms Cecilio Asper says the medication greatly improved her mother's mental status. Ms Cecilio Asper says, other than some UTIs, Pt has been doing reasonably well for two years. She says one week ago, Pt began crying uncontrollably and insisting that she was not in her  own home. She says Pt repeatedly tried to leave the home at all hours of the day and night. Ms Cecilio Asper states three days ago she became more aggressive. She has become delusional about drug dealers in the house and neighborhood. Pt has been  falling because she is so agitated. She will not take her medication and is not eating. Ms Cecilio Asper says Pt has a lot of family support and they do not know what to do. She says she absolutely does not want her mother admitted to a nursing home, that she and other family members want to care for Pt at home after she is stabilized.   Pt is dressed in hospital gown, alert and oriented to person and place, not date or situation. Pt speaks in a clear tone, at moderate volume and normal pace. Motor behavior appears normal. Eye contact is good and Pt is at times tearful. Pt's mood is anxious and affect is congruent with mood. Thought process is tangential with delusional content. She is pleasant and cooperative.  Chief Complaint:  Chief Complaint  Patient presents with   Fall   Visit Diagnosis: F01.51 Major vascular neurocognitive disorder, Probable, With behavioral disturbance   CCA Screening, Triage and Referral (STR)  Patient Reported Information How did you hear about [place]? Family/Friend  What Is the Reason for Your Visit/Call Today? Pt has diagnosis of dementia and was petitioned for involuntary commitment by daughter who reports is refusing medication, not eating, not caring for hygiene, aggressive, crying uncontrollably, delusional, and frequently falling.  How Long Has This Been Causing You Problems? <Week  What Do You Feel Would Help You the Most Today? Medication(s)   Have You Recently Had Any Thoughts About Hurting Yourself? No  Are You Planning to Commit Suicide/Harm Yourself At This time? No   Have you Recently Had Thoughts About Hurting Someone Karolee Ohs? No  Are You Planning to Harm Someone at This Time? No  Explanation: No data recorded  Have You Used Any Alcohol or Drugs in the Past 24 Hours? No  How Long Ago Did You Use Drugs or Alcohol? No data recorded What Did You Use and How Much? No data recorded  Do You Currently Have a Therapist/Psychiatrist? No  Name of  Therapist/Psychiatrist: No data recorded  Have You Been Recently Discharged From Any Office Practice or Programs? No  Explanation of Discharge From Practice/Program: No data recorded    CCA Screening Triage Referral Assessment Type of Contact: Tele-Assessment  Telemedicine Service Delivery: Telemedicine service delivery: This service was provided via telemedicine using a 2-way, interactive audio and video technology  Is this Initial or Reassessment? Initial Assessment  Date Telepsych consult ordered in [facility]:  06/29/22  Time Telepsych consult ordered in [facility]:  1905  Location of Assessment: [facility] ED  Provider Location: [facility] Assessment Services   Collateral Involvement: Pt's daughter: Beverely Pace [phone]   Does Patient Have a Court Appointed Legal Guardian? No  Legal Guardian Contact Information: No data recorded Copy of Legal Guardianship Form: No data recorded Legal Guardian Notified of Arrival: No data recorded Legal Guardian Notified of Pending Discharge: No data recorded If Minor and Not Living with Parent(s), Who has Custody? NA  Is CPS involved or ever been involved? Never  Is APS involved or ever been involved? Never   Patient Determined To Be At Risk for Harm To Self or Others Based on Review of Patient Reported Information or Presenting Complaint? No  Method: No data recorded Availability of Means: No  data recorded Intent: No data recorded Notification Required: No data recorded Additional Information for Danger to Others Potential: No data recorded Additional Comments for Danger to Others Potential: No data recorded Are There Guns or Other Weapons in Your Home? No data recorded Types of Guns/Weapons: No data recorded Are These Weapons Safely Secured?                            No data recorded Who Could Verify You Are Able To Have These Secured: No data recorded Do You Have any Outstanding Charges, Pending Court Dates, Parole/Probation? No data  recorded Contacted To Inform of Risk of Harm To Self or Others: Family/Significant Other:; Law Enforcement    Does Patient Present under Involuntary Commitment? Yes  IVC Papers Initial File Date: 06/29/22   [place] of Residence: [place]   Patient Currently Receiving the Following Services: Medication Management   Determination of Need: Emergent (2 hours)   Options For Referral: Herbalist; Medication Management     CCA Biopsychosocial Patient Reported Schizophrenia/Schizoaffective Diagnosis in Past: No   Strengths: Pt has good family support   Mental Health Symptoms Depression:   Change in energy/activity; Difficulty Concentrating; Increase/decrease in appetite; Sleep (too much or little); Irritability; Tearfulness   Duration of Depressive symptoms:  Duration of Depressive Symptoms: Less than two weeks   Mania:   Change in energy/activity; Irritability; Recklessness   Anxiety:    Worrying; Tension; Sleep; Restlessness; Irritability; Difficulty concentrating   Psychosis:   Delusions   Duration of Psychotic symptoms:  Duration of Psychotic Symptoms: Less than six months   Trauma:   None   Obsessions:   None   Compulsions:   None   Inattention:   None   Hyperactivity/Impulsivity:   N/A   Oppositional/Defiant Behaviors:   N/A   Emotional Irregularity:   None   Other Mood/Personality Symptoms:   None    Mental Status Exam Appearance and self-care  Stature:   Average   Weight:   Thin   Clothing:   Disheveled   Grooming:   Neglected   Cosmetic use:   None   Posture/gait:   Normal   Motor activity:   Not Remarkable   Sensorium  Attention:   Distractible   Concentration:   Focuses on irrelevancies   Orientation:   X5   Recall/memory:   Defective in Recent   Affect and Mood  Affect:   Anxious   Mood:   Anxious   Relating  Eye contact:   Normal   Facial expression:   Responsive; Anxious    Attitude toward examiner:   Cooperative   Thought and Language  Speech flow:  Normal   Thought content:   Delusions   Preoccupation:   None   Hallucinations:   None   Organization:  No data recorded  [employer] of Knowledge:   Fair   Intelligence:   Average   Abstraction:   Functional   Judgement:   Impaired   Reality Testing:   Distorted   Insight:   Poor   Decision Making:   Impulsive   Social Functioning  Social Maturity:   Impulsive   Social Judgement:   Heedless   Stress  Stressors:   Illness   Coping Ability:   Exhausted; Overwhelmed   Skill Deficits:   Decision making; Responsibility; Self-care; Self-control   Supports:   Family; Friends/Service system     Religion: Religion/Spirituality Are You  A Religious Person?: Yes What is Your Religious Affiliation?: Christian How Might This Affect Treatment?: NA  Leisure/Recreation: Leisure / Recreation Do You Have Hobbies?: Yes Leisure and Hobbies: Pt likes gardening  Exercise/Diet: Exercise/Diet Do You Exercise?: No Have You Gained or Lost A Significant Amount of Weight in the Past Six Months?: No Do You Follow a Special Diet?: No Do You Have Any Trouble Sleeping?: Yes Explanation of Sleeping Difficulties: Pt reports poor sleep   CCA Employment/Education Employment/Work Situation: Employment / Work Nurse, children's Situation: Retired Social research officer, government has Been Impacted by Current Illness: No Has Patient ever Been in Passenger transport manager?: No  Education: Education Is Patient Currently Attending School?: No Last Grade Completed: 12 Did You Nutritional therapist?: No Did You Have An Individualized Education Program (IIEP): No Did You Have Any Difficulty At [employer]?: No Patient's Education Has Been Impacted by Current Illness: No   CCA Family/Childhood History Family and Relationship History: Family history Marital status: Widowed Widowed, when?: 6 years ago Does  patient have children?: Yes How many children?: 2 How is patient's relationship with their children?: daughter, close and supportive  Childhood History:  Childhood History By whom was/is the patient raised?: Both parents Did patient suffer any verbal/emotional/physical/sexual abuse as a child?: No Did patient suffer from severe childhood neglect?: No Has patient ever been sexually abused/assaulted/raped as an adolescent or adult?: No Was the patient ever a victim of a crime or a disaster?: No Witnessed domestic violence?: No Has patient been affected by domestic violence as an adult?: No  Child/Adolescent Assessment:     CCA Substance Use Alcohol/Drug Use: Alcohol / Drug Use Pain Medications: see MAR Prescriptions: see MAR Over the Counter: see MAR History of alcohol / drug use?: No history of alcohol / drug abuse                         ASAM's:  Six Dimensions of Multidimensional Assessment  Dimension 1:  Acute Intoxication and/or Withdrawal Potential:      Dimension 2:  Biomedical Conditions and Complications:      Dimension 3:  Emotional, Behavioral, or Cognitive Conditions and Complications:     Dimension 4:  Readiness to Change:     Dimension 5:  Relapse, Continued use, or Continued Problem Potential:     Dimension 6:  Recovery/Living Environment:     ASAM Severity Score:    ASAM Recommended Level of Treatment:     Substance use Disorder (SUD)    Recommendations for Services/Supports/Treatments:    Discharge Disposition:    DSM5 Diagnoses: Patient Active Problem List   Diagnosis Date Noted   Aggressive behavior 01/27/2021   Dementia with behavioral disturbance ([place]) 01/14/2021   Memory loss 09/17/2020   Small vessel disease, 

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
## 2022-06-30 NOTE — Telephone Encounter (Signed)
Patient is still at ER, will make adjustment of her Seroquel during follow up visit.

## 2022-06-30 NOTE — ED Notes (Signed)
Pt's daughter brought a Optician, dispensing with her denture glue for her dentures. Currently with her at bedside

## 2022-07-01 DIAGNOSIS — R410 Disorientation, unspecified: Secondary | ICD-10-CM | POA: Diagnosis not present

## 2022-07-01 DIAGNOSIS — F03918 Unspecified dementia, unspecified severity, with other behavioral disturbance: Secondary | ICD-10-CM | POA: Diagnosis not present

## 2022-07-01 MED ORDER — QUETIAPINE FUMARATE 25 MG PO TABS
25.0000 mg | ORAL_TABLET | Freq: Every morning | ORAL | Status: DC
  Administered 2022-07-02: 25 mg via ORAL
  Filled 2022-07-01: qty 1

## 2022-07-01 MED ORDER — QUETIAPINE FUMARATE 25 MG PO TABS
37.5000 mg | ORAL_TABLET | Freq: Every day | ORAL | Status: DC
  Administered 2022-07-01: 37.5 mg via ORAL
  Filled 2022-07-01: qty 2

## 2022-07-01 NOTE — Progress Notes (Signed)
PROGRESS NOTE    [name]  ZOX:[id number] DOB: 19-Jul-1941 DOA: 06/29/2022 PCP: Richmond Campbell., PA-C    Brief Narrative:  81 year old female with hypertension, advanced dementia with behavioral disturbances brought to ER with aggressive behavior, argument with neighbor, falling on her house on her back.  Skeletal survey negative.  Difficult to control symptoms.  Initially involuntary committed.  Needed symptom control before going home.  Urine thought to be abnormal.   Assessment & Plan:   Altered mental status in a patient with advanced dementia with behavioral disturbances: No adequate support system at home.  Patient will go back on medications as suggested by her neurologist as following Lorazepam 1 mg in the evening Namenda 10 mg twice a day Seroquel 25 mg twice daily, neurology office asked them to decrease the dose Zoloft 200 mg daily Seen by psychiatry in the ER.  No indication for involuntary commitment.  Patient will need close observation, may need one-to-one for behavior issues. Rescinded IVC paperwork.   2.  Suspected UTI: Less likely the cause.  Received 1 dose of Rocephin.  No urinary symptoms.  Will hold off on further antibiotics until urine cultures available.   3.  hypertension: Blood pressure stable.  Medications.   4.  Chronic medical issues including Hypothyroidism: Resume Synthroid. Hyperlipidemia: Tolerating statin.  Patient is medically stable.  She has needed one-to-one observation, remains confused impulsive and unsafe to be by herself.  Detailed discussion with family. Patient's daughter is arranging additional help at home next 24 hours before she can take her mom home. Unsafe discharge.   DVT prophylaxis: enoxaparin (LOVENOX) injection 40 mg Start: 06/30/22 2200   Code Status: Full code Family Communication: Daughter was at the bedside Disposition Plan: Status is: Observation The patient remains OBS appropriate and will d/c before 2  midnights.     Consultants:  Psychiatry  Procedures:  None  Antimicrobials:  Rocephin 1 dose 10/3   Subjective: Patient seen and examined.  She is pleasant.  Still confused but more composed today.  She was able to recall what she ate for breakfast.  She still thinks she lives at home with her grandmom.  Remains slightly impulsive but overall better. Discussed with patient's daughter that this is probably patient's progressive nature of the dementia. Daughter to arrange more help at home before taking her home. I also suggested patient's daughter to look for additional resources/memory care unit for subsequent institutionalization if needed and unable to take care of her at home.  Objective: Vitals:   06/30/22 2323 06/30/22 2330 07/01/22 0407 07/01/22 0923  BP: 128/74  138/84 (!) 142/95  Pulse: 64  66 88  Resp: Temp: 98.5 F (36.9 C)  98.6 F (37 C) 97.7 F (36.5 C)  TempSrc: Oral  Oral Oral  SpO2: 95%  96% 97%  Weight:  58.4 kg    Height:   (1.626 m)      Intake/Output Summary (Last 24 hours) at 07/01/2022 1143 Last data filed at 07/01/2022 0946 Gross per 24 hour  Intake 984.49 ml  Output --  Net 984.49 ml   Filed Weights   06/29/22 1508 06/30/22 2330  Weight: 52.2 kg 58.4 kg    Examination:  General exam: Appears calm and comfortable  Very well behaving.  Answering appropriately.  Alert and awake.  Oriented to herself and occasionally about the place. Respiratory system: Clear to auscultation. Respiratory effort normal. Cardiovascular system: S1 & S2 heard, RRR. No JVD,  murmurs, rubs, gallops or clicks. No pedal edema. Gastrointestinal system: Abdomen is nondistended, soft and nontender. No organomegaly or masses felt. Normal bowel sounds heard. Central nervous system: Alert and awake.  No focal neurological deficits. Extremities: Symmetric 5 x 5 power.     Data Reviewed: I have personally reviewed following labs and imaging  studies  CBC: Recent Labs  Lab 06/27/22 1939 06/29/22 1708  WBC 9.6 7.5  NEUTROABS  --  5.8  HGB 13.5 13.6  HCT 40.8 41.1  MCV 96.5 96.5  PLT 159 165   Basic Metabolic Panel: Recent Labs  Lab 06/27/22 1939 06/29/22 1708  NA 138 137  K 3.8 3.5  CL 104 104  CO2 23 25  GLUCOSE 108* 144*  BUN 15 20  CREATININE 0.81 0.71  CALCIUM 9.6 9.5   GFR: Estimated Creatinine Clearance: 48.4 mL/min (by C-G formula based on SCr of 0.71 mg/dL). Liver Function Tests: Recent Labs  Lab 06/29/22 1708  AST 26  ALT 23  ALKPHOS 108  BILITOT 0.6  PROT 7.0  ALBUMIN 3.9   No results for input(s): "LIPASE", "AMYLASE" in the last 168 hours. No results for input(s): "AMMONIA" in the last 168 hours. Coagulation Profile: No results for input(s): "INR", "PROTIME" in the last 168 hours. Cardiac Enzymes: No results for input(s): "CKTOTAL", "CKMB", "CKMBINDEX", "TROPONINI" in the last 168 hours. BNP (last 3 results) No results for input(s): "PROBNP" in the last 8760 hours. HbA1C: No results for input(s): "HGBA1C" in the last 72 hours. CBG: No results for input(s): "GLUCAP" in the last 168 hours. Lipid Profile: No results for input(s): "CHOL", "HDL", "LDLCALC", "TRIG", "CHOLHDL", "LDLDIRECT" in the last 72 hours. Thyroid Function Tests: No results for input(s): "TSH", "T4TOTAL", "FREET4", "T3FREE", "THYROIDAB" in the last 72 hours. Anemia Panel: No results for input(s): "VITAMINB12", "FOLATE", "FERRITIN", "TIBC", "IRON", "RETICCTPCT" in the last 72 hours. Sepsis Labs: No results for input(s): "PROCALCITON", "LATICACIDVEN" in the last 168 hours.  No results found for this or any previous visit (from the past 240 hour(s)).       Radiology Studies: CT Thoracic Spine Wo Contrast  Result Date: 06/29/2022 CLINICAL DATA:  Trauma, fall, pain EXAM: CT THORACIC SPINE WITHOUT CONTRAST TECHNIQUE: Multidetector CT images of the thoracic were obtained using the standard protocol without  intravenous contrast. RADIATION DOSE REDUCTION: This exam was performed according to the departmental dose-optimization program which includes automated exposure control, adjustment of the mA and/or kV according to patient size and/or use of iterative reconstruction technique. COMPARISON:  06/27/2022 FINDINGS: Alignment: Alignment of posterior margins of vertebral bodies is essentially unremarkable. Vertebrae: There is decrease in height of bodies of T2, T3, T4, T5, T6 and T8 vertebrae. There is slight worsening of compression in the body of T8 vertebra. Paraspinal and other soft tissues: There is no central spinal stenosis. Disc levels: Beam hardening artifacts caused by neurostimulator lead in thoracic spinal canal limits evaluation of adjacent neural foramina. Rest of the neural foramina are unremarkable. Degenerative changes are noted at the L1-L2 level with encroachment of neural foramina in upper lumbar spine. IMPRESSION: Compression fractures are seen in the bodies of T2, T3, T4, T5, T6 and T8 vertebrae. There is interval worsening of compression in the body of T8 vertebra. Alignment of posterior margins of vertebral bodies has not changed. There is no central spinal stenosis or significant encroachment of neural foramina. If clinically warranted, follow-up MRI may be considered to evaluate the soft tissues in the spinal canal and neural foramina. Electronically Signed  By: Elmer Picker M.D.   On: 06/29/2022 16:33   CT Head Wo Contrast  Result Date: 06/29/2022 CLINICAL DATA:  Trauma, fall EXAM: CT HEAD WITHOUT CONTRAST TECHNIQUE: Contiguous axial images were obtained from the base of the skull through the vertex without intravenous contrast. RADIATION DOSE REDUCTION: This exam was performed according to the departmental dose-optimization program which includes automated exposure control, adjustment of the mA and/or kV according to patient size and/or use of iterative reconstruction technique.  COMPARISON:  06/27/2022 FINDINGS: Brain: No acute intracranial findings are seen. There are no signs of bleeding within the cranium. Cortical sulci are prominent. There is no focal edema or mass effect. Vascular: Scattered arterial calcifications are seen. Skull: No fracture is seen in calvarium. There is subcutaneous contusion/hematoma in the posteromedial left parietal scalp. Sinuses/Orbits: Unremarkable. Other: None. IMPRESSION: No acute intracranial findings are seen in noncontrast CT brain. Atrophy. Electronically Signed   By: Elmer Picker M.D.   On: 06/29/2022 16:05   CT Cervical Spine Wo Contrast  Result Date: 06/29/2022 CLINICAL DATA:  Trauma, fall EXAM: CT CERVICAL SPINE WITHOUT CONTRAST TECHNIQUE: Multidetector CT imaging of the cervical spine was performed without intravenous contrast. Multiplanar CT image reconstructions were also generated. RADIATION DOSE REDUCTION: This exam was performed according to the departmental dose-optimization program which includes automated exposure control, adjustment of the mA and/or kV according to patient size and/or use of iterative reconstruction technique. COMPARISON:  None Available. FINDINGS: Alignment: Alignment of posterior margins of vertebral bodies is unremarkable. Skull base and vertebrae: There is small smooth marginated calcific density adjacent to the tip of spinous process of C6 vertebra, possibly old avulsion. There is linear sclerae density in the soft tissues posterior to the spinous process of C4 vertebra suggesting ligament calcification from previous injury. No definite fracture is seen in cervical spine. There is mild decrease in height of upper endplates of bodies of T2 and T3 vertebrae. This finding will be further evaluated in the CT scan of thoracic spine. Soft tissues and spinal canal: There is no significant central spinal stenosis. Disc levels: There is encroachment of neural foramina at multiple levels, more so at C6-C7 level.  Upper chest: Unremarkable. Other: Thyroid is smaller than usual in size. IMPRESSION: No recent fracture is seen in cervical spine. 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 Electronically Signed   By: Elmer Picker M.D.   On: 06/29/2022 16:02   DG Chest 1 View  Result Date: 06/29/2022 CLINICAL DATA:  Fall EXAM: CHEST  1 VIEW COMPARISON:  06/16/2022 FINDINGS: Stable heart size. Large hiatal hernia. No focal airspace consolidation, pleural effusion, or pneumothorax. No acute bony findings. IMPRESSION: 1. No acute cardiopulmonary findings. 2. Large hiatal hernia. Electronically Signed   By: Davina Poke D.O.   On: 06/29/2022 15:41        Scheduled Meds:  aspirin EC  81 mg Oral Daily   cholecalciferol  5,000 Units Oral Daily   doxazosin  8 mg Oral Daily   enoxaparin (LOVENOX) injection  40 mg Subcutaneous Q24H   ferrous sulfate  325 mg Oral Daily   isosorbide mononitrate  60 mg Oral Daily   levothyroxine  75 mcg Oral Q0600   lidocaine  1 patch Transdermal Q24H   LORazepam  1 mg Oral QPM   memantine  10 mg Oral BID   multivitamin with minerals  1 tablet Oral Daily   QUEtiapine  25 mg Oral BID   sertraline  200 mg Oral Daily   simvastatin  40 mg Oral QHS   Continuous Infusions:  sodium chloride 75 mL/hr at 07/01/22 0940     LOS: 0 days    Time spent: 35 minutes    Barb Merino, MD [facility] Pager [phone]

## 2022-07-01 NOTE — Plan of Care (Signed)

## 2022-07-01 NOTE — Telephone Encounter (Signed)
Pt's daughter [name]) has called stating the hospital has been given 25 mg of the Seroquel in the morning and at night.  Daughter has noticed some difference.  Daughter was recently called from the hospital re: similar behaviors pt displays at home.  Daughter is asking if Dr is willing to increase the dose of the Seroquel for both morning and at night, please call.  Daughter also mentioned the hospital made the suggestion of placing pt in a Geriatric clinic for a week or so to assist pt with walking and taking medication, Daughter would like to discuss this as well.

## 2022-07-01 NOTE — TOC Transition Note (Signed)
Transition of Care [facility]) - CM/SW Discharge Note   Patient Details  Name: SHAMS FILL MRN: [id number] Date of Birth: April 26, 1941  [facility] CM/SW Contact:  Lennart Pall, LCSW Phone Number: 07/01/2022, 2:14 PM   Clinical Narrative:    Alerted today that pt has been psychiatrically cleared and no longer under IVC.  Paperwork signed to rescind IVC by Dr. Sloan Leiter and faxed to [facility] office.  Spoke with daughter who confirms plan for pt to dc back to home with her and she is arranging additional help in the home.  No further TOC needs.   Final next level of care: Home/Self Care Barriers to Discharge: ED Facility/Family Requesting Medication(s) Adjustment   Patient Goals and CMS Choice        Discharge Placement                       Discharge Plan and Services                DME Arranged: N/A DME Agency: NA                  Social Determinants of Health (SDOH) Interventions     Readmission Risk Interventions     No data to display

## 2022-07-01 NOTE — Telephone Encounter (Signed)
Called daughter who stated patient isn't eating, will be discharged tomorrow. She stated dr suggested getting her admitted to geriatric clinic, but needs leg stimulator for foot drop activated first then be admitted for rehab. She asked if seroquel could be increased. I advised as long as she is inpatient Dr Krista Blue can't prescribe or make changes to med doses; she should discuss with nurse at hospital. Advised she talk with SW at hospital before discharge. Discussed ways to manage her when she gets home. Daughter verbalized understanding, appreciation.

## 2022-07-02 DIAGNOSIS — F03918 Unspecified dementia, unspecified severity, with other behavioral disturbance: Secondary | ICD-10-CM | POA: Diagnosis not present

## 2022-07-02 DIAGNOSIS — R410 Disorientation, unspecified: Secondary | ICD-10-CM | POA: Diagnosis not present

## 2022-07-02 LAB — URINE CULTURE: Culture: NO GROWTH

## 2022-07-02 NOTE — Plan of Care (Signed)
  Problem: Education: Goal: Knowledge of General Education information will improve Description: Including pain rating scale, medication(s)/side effects and non-pharmacologic comfort measures Outcome: Progressing   Problem: Health Behavior/Discharge Planning: Goal: Ability to manage health-related needs will improve Outcome: Progressing   Problem: Clinical Measurements: Goal: Ability to maintain clinical measurements within normal limits will improve Outcome: Progressing   Problem: Nutrition: Goal: Adequate nutrition will be maintained Outcome: Progressing   Problem: Coping: Goal: Level of anxiety will decrease Outcome: Progressing   Problem: Elimination: Goal: Will not experience complications related to bowel motility Outcome: Progressing   Problem: Pain Managment: Goal: General experience of comfort will improve Outcome: Progressing   Problem: Safety: Goal: Ability to remain free from injury will improve Outcome: Progressing   

## 2022-07-02 NOTE — Progress Notes (Signed)
[employer] [facility]) Hospital Liaison note:  Notified via Epic workque from Dr. Barb Merino of request for [place] services. Will continue to follow for disposition.  Please call with any outpatient palliative questions or concerns.  Thank you for the opportunity to participate in this patient's care.  Thank you, Lorelee Market, LPN [facility] Hospital Liaison [phone]

## 2022-07-02 NOTE — Progress Notes (Signed)
Provided discharge education/instructions to Pt's daughters, all questions and concerns addressed. Pt not in acute distress, discharged home with belongings.

## 2022-07-02 NOTE — Discharge Summary (Signed)
Physician Discharge Summary  [name] ZOX:[id number] DOB: [birth year] DOA: 06/29/2022  PCP: Richmond Campbell., PA-C  Admit date: 06/29/2022 Discharge date: 07/02/2022  Admitted From: Home Disposition: Home  Recommendations for Outpatient Follow-up:  Follow up with PCP in 1-2 weeks Continue to follow-up with your neurology We will send referral to palliative care, recommend home visits.  Home Health: N/A Equipment/Devices: N/A  Discharge Condition: Fair CODE STATUS: Full code Diet recommendation: Low-salt diet  Discharge summary: 81 year old with hypertension, advanced dementia with behavioral disturbances was brought to the ER with aggressive behavior, arguing with family and neighbors, she fell and hurt her back while getting agitated.  Skeletal survey was negative.  She had difficult to control symptoms.  Initially involuntary committed but cleared by psychiatry.  Urinalysis was thought to be abnormal, was given 1 dose of Rocephin, subsequent urine cultures were negative. Patient was admitted to the hospital, her home medications were resumed, Seroquel dose was increased as recommended by her psychiatrist.  She does not have any other acute medical issues but she has ongoing dementia symptoms with behavioral disturbances.  Extensive discussion, counseling done to family.  Patient is currently medically stable.  She will need 24/7 care, fall precautions.  She is on multiple symptom control medications and managed by neurology.  Patient is going home today with her daughter and she has arranged for around-the-clock care. Dementia progression symptoms discussed, patient's family will need more education and preparedness for worsening symptoms.  They are agreeable to have palliative care consultation at home.  Stable for discharge.   Discharge Diagnoses:  Principal Problem:   Altered mental status Active Problems:   Hypertension   Hyperlipidemia   Coronary artery disease    Dementia with behavioral disturbance [facility])    Discharge Instructions  Discharge Instructions     Amb Referral to Palliative Care   Complete by: As directed    Diet general   Complete by: As directed    Increase activity slowly   Complete by: As directed       Allergies as of 07/02/2022       Reactions   Nsaids Other (See Comments)   GI upset        Medication List     STOP taking these medications    ARIPiprazole 2 MG tablet Commonly known as: ABILIFY       TAKE these medications    acetaminophen 500 MG tablet Commonly known as: TYLENOL Take 500 mg by mouth every 6 (six) hours as needed for moderate pain.   aspirin 81 MG tablet Take 81 mg by mouth daily.   doxazosin 8 MG tablet Commonly known as: CARDURA Take 8 mg by mouth daily.   ferrous sulfate 324 MG Tbec Take 324 mg by mouth.   fluticasone 50 MCG/ACT nasal spray Commonly known as: FLONASE Place 1 spray into both nostrils daily as needed for allergies or rhinitis.   isosorbide mononitrate 60 MG 24 hr tablet Commonly known as: IMDUR Take 60 mg by mouth daily.   levothyroxine 75 MCG tablet Commonly known as: SYNTHROID Take 75 mcg by mouth daily.   lidocaine 5 % Commonly known as: Lidoderm Place 1 patch onto the skin daily. Remove & Discard patch within 12 hours or as directed by MD   LORazepam 2 MG tablet Commonly known as: ATIVAN Take 0.5 tablets (1 mg total) by mouth every evening. What changed: when to take this   meclizine 25 MG tablet Commonly known as: ANTIVERT Take 1  tablet (25 mg total) by mouth 3 (three) times daily as needed for dizziness.   memantine 10 MG tablet Commonly known as: NAMENDA TAKE 1 TABLET BY MOUTH TWICE A DAY   multivitamin tablet Take 1 tablet by mouth every morning.   nitrofurantoin 100 MG capsule Commonly known as: MACRODANTIN Take 100 mg by mouth daily.   ondansetron 8 MG disintegrating tablet Commonly known as: ZOFRAN-ODT Take 1 tablet (8 mg  total) by mouth every 8 (eight) hours as needed for nausea or vomiting.   QUEtiapine 25 MG tablet Commonly known as: SEROQUEL Take 1 tablet (25 mg total) by mouth at bedtime. What changed: when to take this   sertraline 100 MG tablet Commonly known as: ZOLOFT Take 200 mg by mouth daily.   simvastatin 40 MG tablet Commonly known as: ZOCOR Take 40 mg by mouth at bedtime.   Vitamin D 125 MCG (5000 UT) Caps Take 5,000 Units by mouth daily.        Allergies  Allergen Reactions   Nsaids Other (See Comments)    GI upset    Consultations: Psychiatry   Procedures/Studies: CT Thoracic Spine Wo Contrast  Result Date: 06/29/2022 CLINICAL DATA:  Trauma, fall, pain EXAM: CT THORACIC SPINE WITHOUT CONTRAST TECHNIQUE: Multidetector CT images of the thoracic were obtained using the standard protocol without intravenous contrast. RADIATION DOSE REDUCTION: This exam was performed according to the departmental dose-optimization program which includes automated exposure control, adjustment of the mA and/or kV according to patient size and/or use of iterative reconstruction technique. COMPARISON:  06/27/2022 FINDINGS: Alignment: Alignment of posterior margins of vertebral bodies is essentially unremarkable. Vertebrae: There is decrease in height of bodies of T2, T3, T4, T5, T6 and T8 vertebrae. There is slight worsening of compression in the body of T8 vertebra. Paraspinal and other soft tissues: There is no central spinal stenosis. Disc levels: Beam hardening artifacts caused by neurostimulator lead in thoracic spinal canal limits evaluation of adjacent neural foramina. Rest of the neural foramina are unremarkable. Degenerative changes are noted at the L1-L2 level with encroachment of neural foramina in upper lumbar spine. IMPRESSION: Compression fractures are seen in the bodies of T2, T3, T4, T5, T6 and T8 vertebrae. There is interval worsening of compression in the body of T8 vertebra. Alignment of  posterior margins of vertebral bodies has not changed. There is no central spinal stenosis or significant encroachment of neural foramina. If clinically warranted, follow-up MRI may be considered to evaluate the soft tissues in the spinal canal and neural foramina. Electronically Signed   By: Ernie Avena M.D.   On: 06/29/2022 16:33   CT Head Wo Contrast  Result Date: 06/29/2022 CLINICAL DATA:  Trauma, fall EXAM: CT HEAD WITHOUT CONTRAST TECHNIQUE: Contiguous axial images were obtained from the base of the skull through the vertex without intravenous contrast. RADIATION DOSE REDUCTION: This exam was performed according to the departmental dose-optimization program which includes automated exposure control, adjustment of the mA and/or kV according to patient size and/or use of iterative reconstruction technique. COMPARISON:  06/27/2022 FINDINGS: Brain: No acute intracranial findings are seen. There are no signs of bleeding within the cranium. Cortical sulci are prominent. There is no focal edema or mass effect. Vascular: Scattered arterial calcifications are seen. Skull: No fracture is seen in calvarium. There is subcutaneous contusion/hematoma in the posteromedial left parietal scalp. Sinuses/Orbits: Unremarkable. Other: None. IMPRESSION: No acute intracranial findings are seen in noncontrast CT brain. Atrophy. Electronically Signed   By: Harlan Stains.D.  On: 06/29/2022 16:05   CT Cervical Spine Wo Contrast  Result Date: 06/29/2022 CLINICAL DATA:  Trauma, fall EXAM: CT CERVICAL SPINE WITHOUT CONTRAST TECHNIQUE: Multidetector CT imaging of the cervical spine was performed without intravenous contrast. Multiplanar CT image reconstructions were also generated. RADIATION DOSE REDUCTION: This exam was performed according to the departmental dose-optimization program which includes automated exposure control, adjustment of the mA and/or kV according to patient size and/or use of iterative  reconstruction technique. COMPARISON:  None Available. FINDINGS: Alignment: Alignment of posterior margins of vertebral bodies is unremarkable. Skull base and vertebrae: There is small smooth marginated calcific density adjacent to the tip of spinous process of C6 vertebra, possibly old avulsion. There is linear sclerae density in the soft tissues posterior to the spinous process of C4 vertebra suggesting ligament calcification from previous injury. No definite fracture is seen in cervical spine. There is mild decrease in height of upper endplates of bodies of T2 and T3 vertebrae. This finding will be further evaluated in the CT scan of thoracic spine. Soft tissues and spinal canal: There is no significant central spinal stenosis. Disc levels: There is encroachment of neural foramina at multiple levels, more so at C6-C7 level. Upper chest: Unremarkable. Other: Thyroid is smaller than usual in size. IMPRESSION: No recent fracture is seen in cervical spine. 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 Electronically Signed   By: Ernie AvenaPalani  Rathinasamy M.D.   On: 06/29/2022 16:02   DG Chest 1 View  Result Date: 06/29/2022 CLINICAL DATA:  Fall EXAM: CHEST  1 VIEW COMPARISON:  06/16/2022 FINDINGS: Stable heart size. Large hiatal hernia. No focal airspace consolidation, pleural effusion, or pneumothorax. No acute bony findings. IMPRESSION: 1. No acute cardiopulmonary findings. 2. Large hiatal hernia. Electronically Signed   By: Duanne GuessNicholas  Plundo D.O.   On: 06/29/2022 15:41   CT T-SPINE NO CHARGE  Result Date: 06/27/2022 CLINICAL DATA:  Fall EXAM: CT THORACIC AND LUMBAR SPINE WITHOUT CONTRAST TECHNIQUE: Multidetector CT imaging of the thoracic and lumbar spine was performed without contrast. Multiplanar CT image reconstructions were also generated. RADIATION DOSE REDUCTION: This exam was performed according to the departmental dose-optimization program which includes automated exposure control, adjustment of the mA  and/or kV according to patient size and/or use of iterative reconstruction technique. COMPARISON:  11/07/2021 CT chest abdomen pelvis, 05/27/2022 lumbar spine radiographs. FINDINGS: CT THORACIC SPINE FINDINGS Alignment: Mild S shaped curvature of the thoracolumbar spine. No listhesis. Vertebrae: Compared to 11/07/2021, there is mild vertebral body height loss in T3, T4, T7, and T9, of indeterminate acuity but possibly acute. At T3 and T4, there is increased superior endplate depression and mildly increased density with 10% vertebral body height loss. No retropulsion. At T7, there is 30% vertebral body height loss with 2-3 mm retropulsion of the posterosuperior cortex. At T9, there is approximately 20% vertebral body height loss. No retropulsion. Paraspinal and other soft tissues: A spinal cord stimulator is seen entering the spinal canal between T10 and T11 with tip at the T8-T9 level. Please see same-day CT chest abdomen pelvis for additional findings. Disc levels: No significant spinal canal stenosis. CT LUMBAR SPINE FINDINGS Segmentation: 5 lumbar type vertebrae. Alignment: Mild levocurvature.  No significant listhesis. Vertebrae: No acute fracture or suspicious osseous lesion. Endplate degenerative changes at L2-L3, eccentric to the left, and L4-L5, eccentric to the right. Paraspinal and other soft tissues: Please see same-day CT chest abdomen pelvis. Disc levels: T12-L1: No significant disc bulge. No spinal canal stenosis or neural foraminal narrowing.  L1-L2: No significant disc bulge. No spinal canal stenosis or neural foraminal narrowing. L2-L3: Disc height loss with disc osteophyte complex. No significant spinal canal stenosis. Mild left neural foraminal narrowing. L3-L4: Mild disc bulge. No spinal canal stenosis or neural foraminal narrowing. L4-L5: Disc height loss with disc osteophyte complex. Mild right-greater-than-left facet arthropathy. Ligamentum flavum hypertrophy. Mild spinal canal stenosis. Mild  left and moderate right neural foraminal narrowing. L5-S1: No significant disc bulge. No spinal canal stenosis. Mild right neural foraminal narrowing. IMPRESSION: 1. There is mild vertebral body height loss in T3, T4, T7, and T9 compared to 11/07/2021, of indeterminate acuity but possibly acute. 2-3 mm retropulsion at T7 where there is also 30% vertebral body height loss. Correlate with point tenderness and consider MRI if clinically indicated. 2. No acute fracture in the lumbar spine. 3. Multilevel degenerative changes, most prominent at L4-L5 where there is mild spinal canal stenosis, mild left neural foraminal narrowing, and moderate right neural foraminal narrowing. Electronically Signed   By: Wiliam Ke M.D.   On: 06/27/2022 21:53   CT L-SPINE NO CHARGE  Result Date: 06/27/2022 CLINICAL DATA:  Fall EXAM: CT THORACIC AND LUMBAR SPINE WITHOUT CONTRAST TECHNIQUE: Multidetector CT imaging of the thoracic and lumbar spine was performed without contrast. Multiplanar CT image reconstructions were also generated. RADIATION DOSE REDUCTION: This exam was performed according to the departmental dose-optimization program which includes automated exposure control, adjustment of the mA and/or kV according to patient size and/or use of iterative reconstruction technique. COMPARISON:  11/07/2021 CT chest abdomen pelvis, 05/27/2022 lumbar spine radiographs. FINDINGS: CT THORACIC SPINE FINDINGS Alignment: Mild S shaped curvature of the thoracolumbar spine. No listhesis. Vertebrae: Compared to 11/07/2021, there is mild vertebral body height loss in T3, T4, T7, and T9, of indeterminate acuity but possibly acute. At T3 and T4, there is increased superior endplate depression and mildly increased density with 10% vertebral body height loss. No retropulsion. At T7, there is 30% vertebral body height loss with 2-3 mm retropulsion of the posterosuperior cortex. At T9, there is approximately 20% vertebral body height loss. No  retropulsion. Paraspinal and other soft tissues: A spinal cord stimulator is seen entering the spinal canal between T10 and T11 with tip at the T8-T9 level. Please see same-day CT chest abdomen pelvis for additional findings. Disc levels: No significant spinal canal stenosis. CT LUMBAR SPINE FINDINGS Segmentation: 5 lumbar type vertebrae. Alignment: Mild levocurvature.  No significant listhesis. Vertebrae: No acute fracture or suspicious osseous lesion. Endplate degenerative changes at L2-L3, eccentric to the left, and L4-L5, eccentric to the right. Paraspinal and other soft tissues: Please see same-day CT chest abdomen pelvis. Disc levels: T12-L1: No significant disc bulge. No spinal canal stenosis or neural foraminal narrowing. L1-L2: No significant disc bulge. No spinal canal stenosis or neural foraminal narrowing. L2-L3: Disc height loss with disc osteophyte complex. No significant spinal canal stenosis. Mild left neural foraminal narrowing. L3-L4: Mild disc bulge. No spinal canal stenosis or neural foraminal narrowing. L4-L5: Disc height loss with disc osteophyte complex. Mild right-greater-than-left facet arthropathy. Ligamentum flavum hypertrophy. Mild spinal canal stenosis. Mild left and moderate right neural foraminal narrowing. L5-S1: No significant disc bulge. No spinal canal stenosis. Mild right neural foraminal narrowing. IMPRESSION: 1. There is mild vertebral body height loss in T3, T4, T7, and T9 compared to 11/07/2021, of indeterminate acuity but possibly acute. 2-3 mm retropulsion at T7 where there is also 30% vertebral body height loss. Correlate with point tenderness and consider MRI if clinically indicated.  2. No acute fracture in the lumbar spine. 3. Multilevel degenerative changes, most prominent at L4-L5 where there is mild spinal canal stenosis, mild left neural foraminal narrowing, and moderate right neural foraminal narrowing. Electronically Signed   By: Wiliam Ke M.D.   On: 06/27/2022  21:53   CT CHEST ABDOMEN PELVIS W CONTRAST  Result Date: 06/27/2022 CLINICAL DATA:  Recent fall with chest and abdominal pain, initial encounter EXAM: CT CHEST, ABDOMEN, AND PELVIS WITH CONTRAST TECHNIQUE: Multidetector CT imaging of the chest, abdomen and pelvis was performed following the standard protocol during bolus administration of intravenous contrast. RADIATION DOSE REDUCTION: This exam was performed according to the departmental dose-optimization program which includes automated exposure control, adjustment of the mA and/or kV according to patient size and/or use of iterative reconstruction technique. CONTRAST:  75mL OMNIPAQUE IOHEXOL 350 MG/ML SOLN COMPARISON:  None Available. FINDINGS: CT CHEST FINDINGS Cardiovascular: Thoracic aorta and its branches demonstrate atherosclerotic calcifications. No aneurysmal dilatation or dissection is seen. Heart is mildly enlarged in size. No significant coronary calcifications are noted. The pulmonary artery shows a normal branching pattern bilaterally. No large central embolus is seen although timing was not performed for embolus evaluation. Mediastinum/Nodes: Thoracic inlet is within normal limits. No hilar or mediastinal adenopathy is noted. The esophagus is within normal limits. A large sliding-type hiatal hernia is noted. A mild degree of organoaxial volvulus of the stomach is noted. Lungs/Pleura: The lungs are well aerated bilaterally. Mild atelectatic changes are noted in the bases bilaterally. No sizable effusion is seen. No parenchymal nodules are noted. Musculoskeletal: Degenerative changes of the thoracic spine are seen. No acute rib abnormality is noted. Spinal stimulator is noted in the thoracic spine. No acute compression fractures are seen. Chronic appearing fractures of T5, T6 and T8 are seen. CT ABDOMEN PELVIS FINDINGS Hepatobiliary: Fatty infiltration of the liver is noted. The gallbladder is within normal limits. Pancreas: Unremarkable. No  pancreatic ductal dilatation or surrounding inflammatory changes. Spleen: Normal in size without focal abnormality. Adrenals/Urinary Tract: Adrenal glands are within normal limits bilaterally. Kidneys demonstrate a normal enhancement pattern. Upper pole renal cyst is noted on the left measuring 4.6 cm in dimension. This is simple in nature and no further follow-up is recommended. No obstructive changes are seen. The bladder is partially distended. Stomach/Bowel: The appendix is not well visualized. No inflammatory changes to suggest appendicitis are seen. Scattered fecal material is noted throughout the colon. No obstructive or inflammatory changes are noted. Small bowel is within normal limits. Large hiatal hernia is again identified as described above with evidence of mild organoaxial gastric volvulus. Vascular/Lymphatic: Aortic atherosclerosis. No enlarged abdominal or pelvic lymph nodes. Reproductive: Uterus and bilateral adnexa are unremarkable. Other: No abdominal wall hernia or abnormality. No abdominopelvic ascites. Musculoskeletal: Left hip prosthesis is noted. No acute bony abnormality is noted. IMPRESSION: CT of the chest: Large hiatal hernia with mildorganoaxial gastric volvulus. Chronic compression fractures of the thoracic spine are noted. CT of the abdomen and pelvis: Fatty liver. No acute abnormality is noted. Electronically Signed   By: Alcide Clever M.D.   On: 06/27/2022 21:31   DG Hip Unilat W or Wo Pelvis 2-3 Views Right  Result Date: 06/27/2022 CLINICAL DATA:  Fall, right hip pain EXAM: DG HIP (WITH OR WITHOUT PELVIS) 2-3V RIGHT COMPARISON:  None Available. FINDINGS: Osteopenia. No displaced fracture or dislocation of the right hip or pelvis. Status post left hip total arthroplasty. No obvious perihardware fracture or component malpositioning seen in single frontal view. Nonobstructive  pattern of overlying bowel gas. IMPRESSION: 1. No displaced fracture or dislocation of the right hip or  pelvis. Please note that plain radiographs are significantly insensitive for hip and pelvic fracture. 2.  Status post left hip total arthroplasty. Electronically Signed   By: Delanna Ahmadi M.D.   On: 06/27/2022 19:46   CT Head Wo Contrast  Result Date: 06/27/2022 CLINICAL DATA:  Head trauma, minor (Age >= 65y); Neck trauma (Age >= 65y). Pt here from home via [facility]. Pt was walking up stairs in her garage and fell backwards down 2 steps, no LOC. Pt c/o R lower back pain. No blood thinners. Pt's family witnessed all, pt has hematoma to posterior head. 160/76, 93% RA, 73HR EXAM: CT HEAD WITHOUT CONTRAST CT CERVICAL SPINE WITHOUT CONTRAST TECHNIQUE: Multidetector CT imaging of the head and cervical spine was performed following the standard protocol without intravenous contrast. Multiplanar CT image reconstructions of the cervical spine were also generated. RADIATION DOSE REDUCTION: This exam was performed according to the departmental dose-optimization program which includes automated exposure control, adjustment of the mA and/or kV according to patient size and/or use of iterative reconstruction technique. COMPARISON:  CT head and C-spine 06/16/2022 FINDINGS: CT HEAD FINDINGS BRAIN: BRAIN Patchy and confluent areas of decreased attenuation are noted throughout the deep and periventricular white matter of the cerebral hemispheres bilaterally, compatible with chronic microvascular ischemic disease. No evidence of large-territorial acute infarction. No parenchymal hemorrhage. No mass lesion. No extra-axial collection. No mass effect or midline shift. No hydrocephalus. Basilar cisterns are patent. Vascular: No hyperdense vessel. Skull: No acute fracture or focal lesion. Sinuses/Orbits: Paranasal sinuses and mastoid air cells are clear. The orbits are unremarkable. Other: Left occipital scalp 8 mm hematoma formation. CT CERVICAL SPINE FINDINGS Alignment: Normal. Skull base and vertebrae: Degenerative changes at C1-C2  and C6-C7 levels. No associated severe osseous central canal or neural foraminal stenosis. No acute fracture. No aggressive appearing focal osseous lesion or focal pathologic process. Soft tissues and spinal canal: No prevertebral fluid or swelling. No visible canal hematoma. Upper chest: Biapical pleural/pulmonary scarring. 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 IMPRESSION: 1. No acute intracranial abnormality. 2. No acute displaced fracture or traumatic listhesis of the cervical spine. 3. Left occipital scalp 8 mm hematoma. Electronically Signed   By: Iven Finn M.D.   On: 06/27/2022 19:12   CT Cervical Spine Wo Contrast  Result Date: 06/27/2022 CLINICAL DATA:  Head trauma, minor (Age >= 65y); Neck trauma (Age >= 65y). Pt here from home via [facility]. Pt was walking up stairs in her garage and fell backwards down 2 steps, no LOC. Pt c/o R lower back pain. No blood thinners. Pt's family witnessed all, pt has hematoma to posterior head. 160/76, 93% RA, 73HR EXAM: CT HEAD WITHOUT CONTRAST CT CERVICAL SPINE WITHOUT CONTRAST TECHNIQUE: Multidetector CT imaging of the head and cervical spine was performed following the standard protocol without intravenous contrast. Multiplanar CT image reconstructions of the cervical spine were also generated. RADIATION DOSE REDUCTION: This exam was performed according to the departmental dose-optimization program which includes automated exposure control, adjustment of the mA and/or kV according to patient size and/or use of iterative reconstruction technique. COMPARISON:  CT head and C-spine 06/16/2022 FINDINGS: CT HEAD FINDINGS BRAIN: BRAIN Patchy and confluent areas of decreased attenuation are noted throughout the deep and periventricular white matter of the cerebral hemispheres bilaterally, compatible with chronic microvascular ischemic disease. No evidence of large-territorial acute infarction. No parenchymal hemorrhage. No mass  lesion. No  extra-axial collection. No mass effect or midline shift. No hydrocephalus. Basilar cisterns are patent. Vascular: No hyperdense vessel. Skull: No acute fracture or focal lesion. Sinuses/Orbits: Paranasal sinuses and mastoid air cells are clear. The orbits are unremarkable. Other: Left occipital scalp 8 mm hematoma formation. CT CERVICAL SPINE FINDINGS Alignment: Normal. Skull base and vertebrae: Degenerative changes at C1-C2 and C6-C7 levels. No associated severe osseous central canal or neural foraminal stenosis. No acute fracture. No aggressive appearing focal osseous lesion or focal pathologic process. Soft tissues and spinal canal: No prevertebral fluid or swelling. No visible canal hematoma. Upper chest: Biapical pleural/pulmonary scarring. 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 IMPRESSION: 1. No acute intracranial abnormality. 2. No acute displaced fracture or traumatic listhesis of the cervical spine. 3. Left occipital scalp 8 mm hematoma. Electronically Signed   By: Tish Frederickson M.D.   On: 06/27/2022 19:12   DG Lumbar Spine Complete  Result Date: 06/27/2022 CLINICAL DATA:  Low back pain after fall EXAM: LUMBAR SPINE - COMPLETE 4+ VIEW COMPARISON:  Radiographs 05/27/2022 FINDINGS: No evidence of acute fracture or traumatic malalignment in the lumbar spine noting decreased sensitivity due to demineralization. Multilevel spondylosis and facet arthropathy. Moderate to advanced disc space height loss at L2-L3, L4-L5, and L5-S1. Spinal cord stimulator. IMPRESSION: No evidence of acute fracture or traumatic malalignment. Multilevel spondylosis and disc space height loss. Electronically Signed   By: Minerva Fester M.D.   On: 06/27/2022 19:05   CT HEAD WO CONTRAST ( )  Result Date: 06/16/2022 CLINICAL DATA:  Trauma, fall EXAM: CT HEAD WITHOUT CONTRAST TECHNIQUE: Contiguous axial images were obtained from the base of the skull through the vertex without intravenous  contrast. RADIATION DOSE REDUCTION: This exam was performed according to the departmental dose-optimization program which includes automated exposure control, adjustment of the mA and/or kV according to patient size and/or use of iterative reconstruction technique. COMPARISON:  06/10/2022 FINDINGS: Brain: No acute intracranial findings are seen. There are no signs of bleeding within the cranium. Ventricles are not dilated. Cortical sulci are prominent. There is decreased density in periventricular white matter. There is possible small old lacunar infarct in left basal ganglia. Vascular: Scattered arterial calcifications are seen. Skull: Unremarkable. Sinuses/Orbits: There is mucosal thickening in ethmoid sinus. Other: None. IMPRESSION: No acute intracranial findings are seen in noncontrast CT brain. Atrophy. Small-vessel disease. Electronically Signed   By: Ernie Avena M.D.   On: 06/16/2022 19:02   DG Chest 2 View  Result Date: 06/16/2022 CLINICAL DATA:  Chest pain EXAM: CHEST - 2 VIEW COMPARISON:  06/10/2022 FINDINGS: Transverse diameter of heart is increased. Thoracic aorta is tortuous and ectatic. There are no signs of pulmonary edema or focal pulmonary consolidation. There is fixed hiatal hernia. Linear densities in medial left lower lung fields may suggest subsegmental atelectasis. There is no pleural effusion or pneumothorax. There is pain control lead in thoracic spinal canal. IMPRESSION: There are no signs of pulmonary edema or focal pulmonary consolidation. Small linear densities in medial left lower lung fields suggest subsegmental atelectasis. Fixed hiatal hernia. Electronically Signed   By: Ernie Avena M.D.   On: 06/16/2022 18:10   CT Cervical Spine Wo Contrast  Result Date: 06/10/2022 CLINICAL DATA:  Status post fall. EXAM: CT CERVICAL SPINE WITHOUT CONTRAST TECHNIQUE: Multidetector CT imaging of the cervical spine was performed without intravenous contrast. Multiplanar CT image  reconstructions were also generated. RADIATION DOSE REDUCTION: This exam was performed according to the departmental dose-optimization program  which includes automated exposure control, adjustment of the mA and/or kV according to patient size and/or use of iterative reconstruction technique. COMPARISON:  None Available. FINDINGS: Alignment: Normal. Skull base and vertebrae: No acute fracture. No primary bone lesion or focal pathologic process. Soft tissues and spinal canal: No prevertebral fluid or swelling. No visible canal hematoma. Disc levels: Marked severity endplate sclerosis is seen at the level of C6-C7. Marked severity narrowing of the anterior atlantoaxial articulation is seen with marked severity intervertebral disc space narrowing at the level of C6-C7. Bilateral mild-to-moderate severity multilevel facet joint hypertrophy is noted. Upper chest: There is mild biapical scarring and/or atelectasis. Other: None. IMPRESSION: 1. No acute fracture or subluxation of the cervical spine. 2. Marked severity degenerative changes at the level of C6-C7. Electronically Signed   By: Aram Candela M.D.   On: 06/10/2022 23:12   CT Head Wo Contrast  Result Date: 06/10/2022 CLINICAL DATA:  Status post fall. EXAM: CT HEAD WITHOUT CONTRAST TECHNIQUE: Contiguous axial images were obtained from the base of the skull through the vertex without intravenous contrast. RADIATION DOSE REDUCTION: This exam was performed according to the departmental dose-optimization program which includes automated exposure control, adjustment of the mA and/or kV according to patient size and/or use of iterative reconstruction technique. COMPARISON:  November 07, 2021 FINDINGS: Brain: There is mild cerebral atrophy with widening of the extra-axial spaces and ventricular dilatation. There are areas of decreased attenuation within the white matter tracts of the supratentorial brain, consistent with microvascular disease changes. Vascular: No  hyperdense vessel or unexpected calcification. Skull: Normal. Negative for fracture or focal lesion. Sinuses/Orbits: No acute finding. Other: None. IMPRESSION: Generalized cerebral atrophy and chronic white matter small vessel ischemic changes without evidence of an acute intracranial abnormality. Electronically Signed   By: Aram Candela M.D.   On: 06/10/2022 23:11   DG Chest Portable 1 View  Result Date: 06/10/2022 CLINICAL DATA:  Status post fall. EXAM: PORTABLE CHEST 1 VIEW COMPARISON:  None Available. FINDINGS: The cardiac silhouette is mildly enlarged and unchanged in size. Low lung volumes are noted. Mild atelectasis is seen within the bilateral lung bases. There is no evidence of an acute infiltrate, pleural effusion or pneumothorax. A stable moderate sized hiatal hernia is seen. Stable spinal stimulator wire positioning is noted. The visualized skeletal structures are unremarkable. IMPRESSION: 1. Stable cardiomegaly with low lung volumes and mild bibasilar atelectasis. 2. Stable moderate sized hiatal hernia. Electronically Signed   By: Aram Candela M.D.   On: 06/10/2022 23:10   DG Pelvis Portable  Result Date: 06/10/2022 CLINICAL DATA:  Status post fall. EXAM: PORTABLE PELVIS 1-2 VIEWS COMPARISON:  July 17, 2014 FINDINGS: There is no evidence of pelvic fracture or diastasis. 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 No pelvic bone lesions are seen. A radiopaque stimulator and associated stimulator wire seen overlying the right sacroiliac joint. IMPRESSION: 1. No acute osseous abnormality. 2. 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 Electronically Signed   By: Aram Candela M.D.   On: 06/10/2022 23:08   (Echo, Carotid, EGD, Colonoscopy, ERCP)    Subjective: Patient seen and examined.  Still somewhat confused but very interactive, mostly appropriate.  Eager to  go home.  No other overnight events except episodic impulsiveness.  2 daughters at the bedside.   Discharge Exam: Vitals:   07/02/22 0651 07/02/22 0918  BP: (!) 154/77 (!) 173/83  Pulse:  68 77  Resp: 16 17  Temp: 98.1 F (36.7 C) 98.2 F (36.8 C)  SpO2: 99% 93%   Vitals:   07/01/22 1850 07/01/22 2359 07/02/22 0651 07/02/22 0918  BP: (!) 151/89 (!) 127/91 (!) 154/77 (!) 173/83  Pulse: 79 66 68 77  Resp: Temp: 97.7 F (36.5 C) 98 F (36.7 C) 98.1 F (36.7 C) 98.2 F (36.8 C)  TempSrc: Oral Oral Oral Oral  SpO2: 95% 99% 99% 93%  Weight:      Height:        General: Pt is alert, awake, not in acute distress Cardiovascular: RRR, S1/S2 +, no rubs, no gallops Respiratory: CTA bilaterally, no wheezing, no rhonchi Abdominal: Soft, NT, ND, bowel sounds + Extremities: no edema, no cyanosis Patient is oriented to herself and her family, she is not oriented to time place and situation.  Denies any harmful thoughts.    The results of significant diagnostics from this hospitalization (including imaging, microbiology, ancillary and laboratory) are listed below for reference.     Microbiology: Recent Results (from the past 240 hour(s))  Urine Culture     Status: None   Collection Time: 06/30/22  3:22 PM   Specimen: In/Out Cath Urine  Result Value Ref Range Status   Specimen Description   Final    IN/OUT CATH URINE Performed at [facility], [address] [place], [place] [address]    Special Requests   Final    NONE Performed at [facility], [address], [place], [place] [address]    Culture   Final    NO GROWTH Performed at [facility], [address], [place], [place] [address]    Report Status 07/02/2022 FINAL  Final     Labs: BNP (last 3 results) No results for input(s): "BNP" in the last 8760 hours. Basic Metabolic Panel: Recent Labs  Lab 06/27/22 1939 06/29/22 1708  NA 138 137  K 3.8 3.5  CL  104 104  CO2 23 25  GLUCOSE 108* 144*  BUN 15 20  CREATININE 0.81 0.71  CALCIUM 9.6 9.5   Liver Function Tests: Recent Labs  Lab 06/29/22 1708  AST 26  ALT 23  ALKPHOS 108  BILITOT 0.6  PROT 7.0  ALBUMIN 3.9   No results for input(s): "LIPASE", "AMYLASE" in the last 168 hours. No results for input(s): "AMMONIA" in the last 168 hours. CBC: Recent Labs  Lab 06/27/22 1939 06/29/22 1708  WBC 9.6 7.5  NEUTROABS  --  5.8  HGB 13.5 13.6  HCT 40.8 41.1  MCV 96.5 96.5  PLT 159 165   Cardiac Enzymes: No results for input(s): "CKTOTAL", "CKMB", "CKMBINDEX", "TROPONINI" in the last 168 hours. BNP: Invalid input(s): "POCBNP" CBG: No results for input(s): "GLUCAP" in the last 168 hours. D-Dimer No results for input(s): "DDIMER" in the last 72 hours. Hgb A1c No results for input(s): "HGBA1C" in the last 72 hours. Lipid Profile No results for input(s): "CHOL", "HDL", "LDLCALC", "TRIG", "CHOLHDL", "LDLDIRECT" in the last 72 hours. Thyroid function studies No results for input(s): "TSH", "T4TOTAL", "T3FREE", "THYROIDAB" in the last 72 hours.  Invalid input(s): "FREET3" Anemia work up No results for input(s): "VITAMINB12", "FOLATE", "FERRITIN", "TIBC", "IRON", "RETICCTPCT" in the last 72 hours. Urinalysis    Component Value Date/Time   COLORURINE YELLOW 06/29/2022 1522   APPEARANCEUR HAZY (A) 06/29/2022 1522   LABSPEC 1.025 06/29/2022 1522   PHURINE 5.0 06/29/2022 1522  GLUCOSEU NEGATIVE 06/29/2022 1522   HGBUR NEGATIVE 06/29/2022 1522   BILIRUBINUR NEGATIVE 06/29/2022 1522   KETONESUR NEGATIVE 06/29/2022 1522   PROTEINUR NEGATIVE 06/29/2022 1522   UROBILINOGEN 1.0 07/10/2014 0915   NITRITE NEGATIVE 06/29/2022 1522   LEUKOCYTESUR MODERATE (A) 06/29/2022 1522   Sepsis Labs Recent Labs  Lab 06/27/22 1939 06/29/22 1708  WBC 9.6 7.5   Microbiology Recent Results (from the past 240 hour(s))  Urine Culture     Status: None   Collection Time: 06/30/22  3:22 PM    Specimen: In/Out Cath Urine  Result Value Ref Range Status   Specimen Description   Final    IN/OUT CATH URINE Performed at [facility], [address], [place], [place] [address]    Special Requests   Final    NONE Performed at [facility], [address], [place], [place] [address]    Culture   Final    NO GROWTH Performed at [facility], [address] [place], [place] [address]    Report Status 07/02/2022 FINAL  Final     Time coordinating discharge: 32 minutes  SIGNED:   Dorcas Carrow, MD  [facility] 07/02/2022, 11:19 AM

## 2022-07-03 ENCOUNTER — Telehealth: Payer: Self-pay

## 2022-07-03 NOTE — Telephone Encounter (Signed)
Attempted to contact patient's daughter [name] to schedule a Palliative Care consult appointment. No answer left a message to return call.  

## 2022-07-07 ENCOUNTER — Emergency Department (HOSPITAL_COMMUNITY): Payer: Medicare Other

## 2022-07-07 ENCOUNTER — Telehealth: Payer: Self-pay

## 2022-07-07 ENCOUNTER — Emergency Department (HOSPITAL_COMMUNITY)
Admission: EM | Admit: 2022-07-07 | Discharge: 2022-07-08 | Disposition: A | Discharge status: Home / Self Care | Payer: Medicare Other | Attending: Emergency Medicine | Admitting: Emergency Medicine

## 2022-07-07 DIAGNOSIS — Z7982 Long term (current) use of aspirin: Secondary | ICD-10-CM | POA: Diagnosis not present

## 2022-07-07 DIAGNOSIS — M4854XA Collapsed vertebra, not elsewhere classified, thoracic region, initial encounter for fracture: Secondary | ICD-10-CM

## 2022-07-07 DIAGNOSIS — I10 Essential (primary) hypertension: Secondary | ICD-10-CM | POA: Diagnosis not present

## 2022-07-07 DIAGNOSIS — M8448XA Pathological fracture, other site, initial encounter for fracture: Secondary | ICD-10-CM | POA: Insufficient documentation

## 2022-07-07 DIAGNOSIS — F03B3 Unspecified dementia, moderate, with mood disturbance: Secondary | ICD-10-CM | POA: Insufficient documentation

## 2022-07-07 DIAGNOSIS — R4182 Altered mental status, unspecified: Secondary | ICD-10-CM | POA: Diagnosis present

## 2022-07-07 DIAGNOSIS — D649 Anemia, unspecified: Secondary | ICD-10-CM | POA: Insufficient documentation

## 2022-07-07 DIAGNOSIS — I2510 Atherosclerotic heart disease of native coronary artery without angina pectoris: Secondary | ICD-10-CM | POA: Diagnosis not present

## 2022-07-07 LAB — CBC WITH DIFFERENTIAL/PLATELET
Abs Immature Granulocytes: 0.11 10*3/uL — ABNORMAL HIGH (ref 0.00–0.07)
Basophils Absolute: 0 10*3/uL (ref 0.0–0.1)
Basophils Relative: 0 %
Eosinophils Absolute: 0.2 10*3/uL (ref 0.0–0.5)
Eosinophils Relative: 5 %
HCT: 30.5 % — ABNORMAL LOW (ref 36.0–46.0)
Hemoglobin: 10 g/dL — ABNORMAL LOW (ref 12.0–15.0)
Immature Granulocytes: 2 %
Lymphocytes Relative: 27 %
Lymphs Abs: 1.4 10*3/uL (ref 0.7–4.0)
MCH: 32.5 pg (ref 26.0–34.0)
MCHC: 32.8 g/dL (ref 30.0–36.0)
MCV: 99 fL (ref 80.0–100.0)
Monocytes Absolute: 0.6 10*3/uL (ref 0.1–1.0)
Monocytes Relative: 11 %
Neutro Abs: 2.9 10*3/uL (ref 1.7–7.7)
Neutrophils Relative %: 55 %
Platelets: 191 10*3/uL (ref 150–400)
RBC: 3.08 MIL/uL — ABNORMAL LOW (ref 3.87–5.11)
RDW: 12.8 % (ref 11.5–15.5)
WBC: 5.3 10*3/uL (ref 4.0–10.5)
nRBC: 0 % (ref 0.0–0.2)

## 2022-07-07 LAB — URINALYSIS, ROUTINE W REFLEX MICROSCOPIC
Glucose, UA: NEGATIVE mg/dL
Hgb urine dipstick: NEGATIVE
Ketones, ur: NEGATIVE mg/dL
Leukocytes,Ua: NEGATIVE
Nitrite: NEGATIVE
Protein, ur: NEGATIVE mg/dL
Specific Gravity, Urine: 1.031 — ABNORMAL HIGH (ref 1.005–1.030)
pH: 5 (ref 5.0–8.0)

## 2022-07-07 NOTE — ED Triage Notes (Signed)
Pt bib gcems from home for altered mental status. Per ems, pt took night dose of Seroquel and ativan and family found her unresponsive approx 10 minutes later. Per family, pt has been urinating more today and has hx of uti's with maintenance antibiotics. Initially hypotensive at 88 systolic. 565ml NS given PTA.   HR 55, BP 114/64

## 2022-07-07 NOTE — Telephone Encounter (Signed)
Attempted to contact patient's daughter [name] to schedule a Palliative Care consult appointment. No answer left a message to return call.

## 2022-07-07 NOTE — ED Provider Notes (Signed)
[facility] EMERGENCY DEPARTMENT Provider Note   CSN: [id number] Arrival date & time: 07/07/22  2243     History  Chief Complaint  Patient presents with   Altered Mental Status    [name] is a 81 y.o. female.  HPI   Patient with medical history including hypertension, CAD, frequent UTIs, dementia presents with complaints of altered mental status.  HPI was collected by daughter who is at bedside she states that today while patient was eating dinner she became pale, became weak and having difficulty breathing.  She states this happened after patient was given her dose of Ativan as well as Seroquel, there was falls, no slurring of her words,  no chest pain, no stomach pain no nausea or vomiting.  She states that the patient since being discharged from the hospital, patient  has been a bit more sleepy since upping her Seroquel, she states that her urine was slightly cloudy she has been urinating more frequently, she concern for possible UTI.  There is been no other complaints  I have reviewed patient's chart Has been seen multiple times for falls, she was most recently discharged from the hospital on 10/05, she was admitted for altered mental status as well as behavioral disturbances, she was seen by psych who cleared her but they increased her Seroquel from 20 mg once daily to 20 mg twice daily, she is also given Ativan 0.5 given 3 times daily, they had a consultation with palliative who has spoke with the patient's daughter to help with outpatient care.  Home Medications Prior to Admission medications   Medication Sig Start Date End Date Taking? Authorizing Provider  acetaminophen (TYLENOL) 500 MG tablet Take 500 mg by mouth every 6 (six) hours as needed for moderate pain.    [provider]  aspirin 81 MG tablet Take 81 mg by mouth daily.    [provider]  Cholecalciferol (VITAMIN D) 125 MCG (5000 UT) CAPS Take 5,000 Units by mouth daily.     [provider]  doxazosin (CARDURA) 8 MG tablet Take 8 mg by mouth daily.    [provider]  ferrous sulfate 324 MG TBEC Take 324 mg by mouth.    [provider]  fluticasone (FLONASE) 50 MCG/ACT nasal spray Place 1 spray into both nostrils daily as needed for allergies or rhinitis.    [provider]  isosorbide mononitrate (IMDUR) 60 MG 24 hr tablet Take 60 mg by mouth daily.    [provider]  levothyroxine (SYNTHROID) 75 MCG tablet Take 75 mcg by mouth daily. 12/18/20   [provider]  lidocaine (LIDODERM) 5 % Place 1 patch onto the skin daily. Remove & Discard patch within 12 hours or as directed by MD Patient not taking: Reported on 06/29/2022 11/07/21   Couture, Cortni S, PA-C  LORazepam (ATIVAN) 2 MG tablet Take 0.5 tablets (1 mg total) by mouth every evening. Patient taking differently: Take 1 mg by mouth in the morning and at bedtime. 06/26/22   Dohmeier, Asencion Partridge, MD  meclizine (ANTIVERT) 25 MG tablet Take 1 tablet (25 mg total) by mouth 3 (three) times daily as needed for dizziness. [id number]   Delora Fuel, MD  memantine (NAMENDA) 10 MG tablet TAKE 1 TABLET BY MOUTH TWICE A DAY 05/04/22   Marcial Pacas, MD  Multiple Vitamin (MULTIVITAMIN) tablet Take 1 tablet by mouth every morning.    [provider]  nitrofurantoin (MACRODANTIN) 100 MG capsule Take 100 mg by  mouth daily. 05/12/22   [provider]  ondansetron (ZOFRAN-ODT) 8 MG disintegrating tablet Take 1 tablet (8 mg total) by mouth every 8 (eight) hours as needed for nausea or vomiting. [id number]   Delora Fuel, MD  QUEtiapine (SEROQUEL) 25 MG tablet Take 1 tablet (25 mg total) by mouth at bedtime. Patient taking differently: Take 25 mg by mouth 2 (two) times daily. 06/26/22   Dohmeier, Asencion Partridge, MD  sertraline (ZOLOFT) 100 MG tablet Take 200 mg by mouth daily.    [provider]  simvastatin (ZOCOR) 40 MG tablet Take 40 mg by mouth at bedtime. 10/31/20   [provider]      Allergies    Nsaids    Review of Systems   Review of Systems  Unable to perform ROS: Dementia    Physical Exam Updated Vital Signs BP 127/68   Pulse (!) 48   Temp 97.9 F (36.6 C) (Oral)   Resp 12   Ht 5\' 4"  (1.626 m)   Wt 58.4 kg   SpO2 94%   BMI 22.10 kg/m  Physical Exam Vitals and nursing note reviewed.  Constitutional:      General: She is not in acute distress.    Appearance: She is not ill-appearing.     Comments: Patient was asleep but arousable maintaining oral airway no evidence of acute distress hemodynamically stable  HENT:     Head: Normocephalic and atraumatic.     Comments: There is no deformity of the head present no raccoon eyes or battle sign noted.    Nose: No congestion.     Mouth/Throat:     Mouth: Mucous membranes are moist.     Pharynx: Oropharynx is clear.     Comments: No trismus no torticollis no oral trauma Eyes:     Extraocular Movements: Extraocular movements intact.     Conjunctiva/sclera: Conjunctivae normal.     Pupils: Pupils are equal, round, and reactive to light.  Cardiovascular:     Rate and Rhythm: Normal rate and regular rhythm.     Pulses: Normal pulses.     Heart sounds: No murmur heard.    No friction rub. No gallop.  Pulmonary:     Effort: No respiratory distress.     Breath sounds: No wheezing, rhonchi or rales.  Abdominal:     Palpations: Abdomen is soft.     Tenderness: There is no abdominal tenderness. There is no right CVA tenderness or left CVA tenderness.  Musculoskeletal:     Comments: Patient noted tenderness along her cervical and thoracic spine without crepitus or deformities noted, no lumbar tenderness, no pelvis instability no leg shortening.  All compartments are soft in the upper and lower extremities.  Skin:    General: Skin is warm and dry.  Neurological:     Mental Status: She is alert.     Comments: Patient was somnolent on my exam but arousable, she had purposeful movements,  there is no evidence of unilateral weakness, no facial asymmetry  Psychiatric:        Mood and Affect: Mood normal.     ED Results / Procedures / Treatments   Labs (all labs ordered are listed, but only abnormal results are displayed) Labs Reviewed  BASIC METABOLIC PANEL - Abnormal; Notable for the following components:      Result Value   Calcium 8.6 (*)    All other components within normal limits  CBC WITH DIFFERENTIAL/PLATELET - Abnormal; Notable for the following components:  RBC 3.08 (*)    Hemoglobin 10.0 (*)    HCT 30.5 (*)    Abs Immature Granulocytes 0.11 (*)    All other components within normal limits  URINALYSIS, ROUTINE W REFLEX MICROSCOPIC - Abnormal; Notable for the following components:   Color, Urine AMBER (*)    APPearance HAZY (*)    Specific Gravity, Urine 1.031 (*)    Bilirubin Urine SMALL (*)    All other components within normal limits  TROPONIN I (HIGH SENSITIVITY)    EKG EKG Interpretation  Date/Time:  Tuesday July 07 2022 22:44:48 EDT Ventricular Rate:  56 PR Interval:  170 QRS Duration: 90 QT Interval:  465 QTC Calculation: 449 R Axis:   -7 Text Interpretation: Sinus rhythm Abnormal R-wave progression, early transition No significant change since last tracing Confirmed by Dorie Rank [phone]) on 07/07/2022 10:51:48 PM  Radiology CT Thoracic Spine Wo Contrast  Result Date: 07/08/2022 CLINICAL DATA:  Found unresponsive EXAM: CT THORACIC SPINE WITHOUT CONTRAST TECHNIQUE: Multidetector CT images of the thoracic were obtained using the standard protocol without intravenous contrast. RADIATION DOSE REDUCTION: This exam was performed according to the departmental dose-optimization program which includes automated exposure control, adjustment of the mA and/or kV according to patient size and/or use of iterative reconstruction technique. COMPARISON:  06/29/2022 and 06/27/2022 FINDINGS: Alignment: Normal Vertebrae: Wedge compression fractures at T6 and  T8 with 25-50% height loss. These are unchanged since 06/29/2022. Unchanged mild multilevel height loss in the upper thoracic spine. Paraspinal and other soft tissues: There is a spinal stimulator that terminates at the T7 level. Calcific aortic atherosclerosis. Large hiatal hernia. Disc levels: No spinal canal stenosis. IMPRESSION: 1. Wedge compression fractures at T6 and T8 with 25-50% height loss, subacute and unchanged 2. Large hiatal hernia. Aortic Atherosclerosis (ICD10-I70.0). Electronically Signed   By: Ulyses Jarred M.D.   On: 07/08/2022 01:14   CT Head Wo Contrast  Result Date: 07/08/2022 CLINICAL DATA:  Altered mental status EXAM: CT HEAD WITHOUT CONTRAST CT CERVICAL SPINE WITHOUT CONTRAST TECHNIQUE: Multidetector CT imaging of the head and cervical spine was performed following the standard protocol without intravenous contrast. Multiplanar CT image reconstructions of the cervical spine were also generated. RADIATION DOSE REDUCTION: This exam was performed according to the departmental dose-optimization program which includes automated exposure control, adjustment of the mA and/or kV according to patient size and/or use of iterative reconstruction technique. COMPARISON:  06/29/2022 FINDINGS: CT HEAD FINDINGS Brain: No evidence of acute infarction, hemorrhage, hydrocephalus, extra-axial collection or mass lesion/mass effect. Subcortical white matter and periventricular small vessel ischemic changes. Vascular: No hyperdense vessel or unexpected calcification. Skull: Normal. Negative for fracture or focal lesion. Sinuses/Orbits: The visualized paranasal sinuses are essentially clear. The mastoid air cells are unopacified. Other: None. CT CERVICAL SPINE FINDINGS Alignment: Normal cervical lordosis. Skull base and vertebrae: No acute fracture. No primary bone lesion or focal pathologic process. Soft tissues and spinal canal: No prevertebral fluid or swelling. No visible canal hematoma. Disc levels: Mild  degenerative changes at C6-7. Spinal canal is patent. Upper chest: Mild biapical pleural-parenchymal scarring. Other: Visualized thyroid is unremarkable. IMPRESSION: No acute intracranial abnormality. Small vessel ischemic changes. No traumatic injury to the cervical spine. Electronically Signed   By: Julian Hy M.D.   On: 07/08/2022 00:34   CT Cervical Spine Wo Contrast  Result Date: 07/08/2022 CLINICAL DATA:  Altered mental status EXAM: CT HEAD WITHOUT CONTRAST CT CERVICAL SPINE WITHOUT CONTRAST TECHNIQUE: Multidetector CT imaging of the head and cervical spine was  performed following the standard protocol without intravenous contrast. Multiplanar CT image reconstructions of the cervical spine were also generated. RADIATION DOSE REDUCTION: This exam was performed according to the departmental dose-optimization program which includes automated exposure control, adjustment of the mA and/or kV according to patient size and/or use of iterative reconstruction technique. COMPARISON:  06/29/2022 FINDINGS: CT HEAD FINDINGS Brain: No evidence of acute infarction, hemorrhage, hydrocephalus, extra-axial collection or mass lesion/mass effect. Subcortical white matter and periventricular small vessel ischemic changes. Vascular: No hyperdense vessel or unexpected calcification. Skull: Normal. Negative for fracture or focal lesion. Sinuses/Orbits: The visualized paranasal sinuses are essentially clear. The mastoid air cells are unopacified. Other: None. CT CERVICAL SPINE FINDINGS Alignment: Normal cervical lordosis. Skull base and vertebrae: No acute fracture. No primary bone lesion or focal pathologic process. Soft tissues and spinal canal: No prevertebral fluid or swelling. No visible canal hematoma. Disc levels: Mild degenerative changes at C6-7. Spinal canal is patent. Upper chest: Mild biapical pleural-parenchymal scarring. Other: Visualized thyroid is unremarkable. IMPRESSION: No acute intracranial abnormality.  Small vessel ischemic changes. No traumatic injury to the cervical spine. Electronically Signed   By: Julian Hy M.D.   On: 07/08/2022 00:34   DG Chest Portable 1 View  Result Date: 07/07/2022 CLINICAL DATA:  Fall EXAM: PORTABLE CHEST 1 VIEW COMPARISON:  Radiograph 06/29/2022. CT 06/27/2022 FINDINGS: Low lung volumes. Prominent heart size likely accentuated by low lung volumes. Known retrocardiac hiatal hernia. No pneumothorax, pleural effusion or acute airspace disease. Aortic atherosclerosis. Spinal stimulator in place. In limited assessment, no acute osseous findings. IMPRESSION: 1. Low lung volumes without acute abnormality. 2. Known retrocardiac hiatal hernia. Electronically Signed   By: Keith Rake M.D.   On: 07/07/2022 23:49   DG Pelvis Portable  Result Date: 07/07/2022 CLINICAL DATA:  Fall. EXAM: PORTABLE PELVIS 1-2 VIEWS COMPARISON:  06/10/2022 FINDINGS: Cortical margins of the pelvis and right hip are intact. No acute fracture. Left hip arthroplasty without periprosthetic fracture or lucency. Intact pubic rami. Pubic symphysis and sacroiliac joints are congruent. Right-sided stimulator battery pack in place. IMPRESSION: No acute fracture.  Intact left hip arthroplasty. Electronically Signed   By: Keith Rake M.D.   On: 07/07/2022 23:48    Procedures Procedures    Medications Ordered in ED Medications - No data to display  ED Course/ Medical Decision Making/ A&P                           Medical Decision Making Amount and/or Complexity of Data Reviewed Labs: ordered. Radiology: ordered.   This patient presents to the ED for concern of altered mental status, this involves an extensive number of treatment options, and is a complaint that carries with it a high risk of complications and morbidity.  The differential diagnosis includes sepsis, intracranial head bleed, metabolic derailments,    Additional history obtained:  Additional history obtained from  daughter at bedside External records from outside source obtained and reviewed including recent discharge summary   Co morbidities that complicate the patient evaluation  Dementia  Social Determinants of Health:  N/A    Lab Tests:  I Ordered, and personally interpreted labs.  The pertinent results include: CBC shows normocytic anemia hemoglobin 10, BMP unremarkable, UA unremarkable, troponin is negative. UA is unremarkable,   Imaging Studies ordered:  I ordered imaging studies including chest x-ray, pelvis x-ray, CT head thoracic and C-spine I independently visualized and interpreted imaging which showed x-rays are negative CT imaging  is negative for acute findings. I agree with the radiologist interpretation   Cardiac Monitoring:  The patient was maintained on a cardiac monitor.  I personally viewed and interpreted the cardiac monitored which showed an underlying rhythm of: EKG without signs of ischemia   Medicines ordered and prescription drug management:  I ordered medication including N/A I have reviewed the patients home medicines and have made adjustments as needed  Critical Interventions:  N/A   Reevaluation: Presents with altered mental status, she had benign physical exam, I suspect her somnolence is secondary due to polypharmacy, I did elicit some pain on her spine during my physical exam, and she is somnolent, I will obtain CT imaging of the head C-spine thoracic spine and obtain basic lab work-up.  Patient is reassessed resting comfortably, having no complaints, spoke with daughter who is at bedside she is agreement plan discharge at this time.   Consultations Obtained:  N/A    Test Considered:  N/A    Rule out Suspicion for intracranial bleed or mass is low at this time no acute finding on CT imaging.  I do not CVA as she has no focal deficit on my exam.  I have low suspicion for sepsis as she is nontoxic-appearing vital signs are reassuring no  leukocytosis.  Low suspicion for intra-abdominal abnormality as abdomen soft nontender nonsurgical abdomen.  I have low suspicion for spinal cord abnormality that she is moving her upper and lower without difficulty no urinary or bowel incontinence, it is noted that she has a compression fracture at T6 and T8 but after further review these have been present since February they appear to be unchanged.    Dispostion and problem list  After consideration of the diagnostic results and the patients response to treatment, I feel that the patent would benefit from discharge.  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            Final Clinical Impression(s) / ED Diagnoses Final diagnoses:  Nontraumatic compression fracture of T6 vertebra, initial encounter ([place])  Moderate dementia with mood disturbance, unspecified dementia type [facility])    Rx / DC Orders ED Discharge Orders     None         Marcello Fennel, PA-C 07/08/22 0506    Palumbo, April, MD 07/08/22 [phone]

## 2022-07-08 ENCOUNTER — Telehealth: Payer: Self-pay

## 2022-07-08 ENCOUNTER — Emergency Department (HOSPITAL_COMMUNITY): Payer: Medicare Other

## 2022-07-08 LAB — BASIC METABOLIC PANEL
Anion gap: 5 (ref 5–15)
BUN: 18 mg/dL (ref 8–23)
CO2: 27 mmol/L (ref 22–32)
Calcium: 8.6 mg/dL — ABNORMAL LOW (ref 8.9–10.3)
Chloride: 108 mmol/L (ref 98–111)
Creatinine, Ser: 0.88 mg/dL (ref 0.44–1.00)
GFR, Estimated: >60 mL/min (ref >60)
Glucose, Bld: 99 mg/dL (ref 70–99)
Potassium: 3.8 mmol/L (ref 3.5–5.1)
Sodium: 140 mmol/L (ref 135–145)

## 2022-07-08 LAB — TROPONIN I (HIGH SENSITIVITY): Troponin I (High Sensitivity): 5 ng/L (ref <18)

## 2022-07-08 NOTE — ED Notes (Signed)
[facility] CALLED  °

## 2022-07-08 NOTE — ED Notes (Signed)
Patient verbalizes understanding of d/c instructions. Opportunities for questions and answers were provided. Pt d/c from ED and transferred back home via [place]. Per previous RN, pt's daughter will be home to let pt and [facility] in.

## 2022-07-08 NOTE — Telephone Encounter (Signed)
Patient does not meet new medical criteria for Palliative Care services. Referral has been closed. Referring provider advised. 

## 2022-07-08 NOTE — Discharge Instructions (Addendum)
I suspect patient's drowsiness is from the combination of Ativan and Seroquel, I would recommend continue with the Seroquel and only use Ativan if patient is significantly agitated as she is medications combined can make her very sleepy.  It is known that patient has compression fractures in the thoracic spine may follow-up with neurosurgery as needed  Please follow-up with your neurologist for further management of her dementia.  Come back to the emergency department if you develop chest pain, shortness of breath, severe abdominal pain, uncontrolled nausea, vomiting, diarrhea.

## 2022-07-11 ENCOUNTER — Other Ambulatory Visit (HOSPITAL_COMMUNITY): Payer: Self-pay

## 2022-07-13 ENCOUNTER — Ambulatory Visit: Payer: Medicare Other | Admitting: Neurology

## 2022-07-13 ENCOUNTER — Encounter: Payer: Self-pay | Admitting: Neurology

## 2022-07-13 ENCOUNTER — Other Ambulatory Visit: Payer: Self-pay | Admitting: Neurology

## 2022-07-13 VITALS — BP 135/70 | HR 85 | Ht 64.0 in | Wt 128.0 lb

## 2022-07-13 DIAGNOSIS — R269 Unspecified abnormalities of gait and mobility: Secondary | ICD-10-CM | POA: Diagnosis not present

## 2022-07-13 DIAGNOSIS — F03918 Unspecified dementia, unspecified severity, with other behavioral disturbance: Secondary | ICD-10-CM

## 2022-07-13 DIAGNOSIS — M21372 Foot drop, left foot: Secondary | ICD-10-CM

## 2022-07-13 MED ORDER — PANTOPRAZOLE SODIUM 20 MG PO TBEC: 20.0000 mg | DELAYED_RELEASE_TABLET | Freq: Every day | ORAL | 6 refills | Status: DC | PRN

## 2022-07-13 NOTE — Progress Notes (Signed)
ASSESSMENT AND PLAN 81 y.o. year old female    Dementia with agitation  CT head without contrast in February 2023 showed generalized atrophy, small vessel disease, not MRI candidate due to spinal stimulator  Recent hospital admission for increased agitation, fall, CT head showed no acute abnormality,  Now on Zoloft 200 mg daily, higher dose of Seroquel 50 mg daily, previously was on lorazepam from primary care physician, daughter reported patient has difficulty breathing when combining 25 of Seroquel with the lorazepam 2 mg in the morning (reviewed registry, she is getting lorazepam 47 tablets for 30 days, also tramadol as needed),  I have suggested her daughter to give her lorazepam only as needed, 0.5 to 1 mg, Seroquel 25 mg every night, extra 1 tablet if needed for agitation  Abdominal pain,  CT of abdomen pelvis on June 27, 2022 showed large hiatal hernia  Protonix 20 mg as needed   Gait abnormality, left foot drop, at risk for fall  From residual deficit previous left lumbar radiculopathy,  Using walker  Return to clinic in 6 months with nurse practitioner    DIAGNOSTIC DATA (LABS, IMAGING, TESTING) - I reviewed patient records, labs, notes, testing and imaging myself where available. CT head without contrast November 07, 2021, small vessel disease, no acute abnormality, mild generalized atrophy  CT abdomen chest, large hiatal hernia/intrathoracic stomach, aortic atherosclerosis   History of present illness: [name] is a 81 year old female, seen in request by her primary care physician Dr. Redmond Pulling, Josph Macho for evaluation of sudden onset dizziness, gait abnormality, she is accompanied by her daughter Langley Gauss, granddaughter Caryl Pina are at today's clinical visit.  Initial evaluation was on May 18, 2018.   She had a past medical history of hyperlipidemia, hypertension, depression, her husband passed away in 2016-09-15, she now lives with her granddaughter, she had a  history of left lumbar radiculopathy, status decompression surgery in the past, with residual left foot drop, gait abnormality,   On May 11, 2018, she woke up early morning around 6 AM using bathroom, noticed unsteady gait, vertigo, room was spinning, nauseous, she was able to manage to use the bathroom, lying back to sleep again, few hours later, when she tried to get up again, symptoms still present, but had mild improvement, she has been symptomatic since his onset, few days later, when she went out shopping with her sister on May 14, 2018, she suddenly felt nauseous, dizziness, has to sit out waiting for her sister, ambulance was called, she was evaluated at emergency room,   I personally reviewed CTA head and neck on May 14, 2018, 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   Echocardiogram on June 22, 2018 showed ejection fraction 55 to 60%, wall motion was normal,   Laboratory evaluation August 2019, negative troponin, CMP showed elevated glucose 117, showed hemoglobin of 11.7,   She continues to have left foot drop, unsteady gait   UPDATE Sep 17 2020: She is accompanied by her daughter at today's clinical visit, continue to have left foot  drop, mild unsteady gait, wearing a left ankle brace, remain active at church, going out with her friends regularly, still driving, but daughter noticed that she has slow worsening memory loss,   Again personally reviewed CT scan without contrast in October 2019, no acute  abnormality, mild supratentorium small vessel disease.   UPDATE January 14 2021: She is tearful during today's interview, slow worsening memory loss, significant frustration, daughter reported sometimes she becomes so combative, hitting things, she became very frustrated, loses her medicine, lose her keys, blame her family members, which is a change from her baseline, she has always been a capable sweet person   She fell steps, had acute CT head of the brain without contrast in February 2022, no acute abnormality, mild supratentorium small vessel disease   Laboratory evaluation in 2022 showed normal negative vitamin D, TSH, B12, lipid panel, CBC mild anemia hemoglobin of 11.4,, CMP, Falling, combative, hitting, loosing things, loosing her medication, keys, blame other come to house, sweet person, hard on her.   She was given a trial of Aricept in March 2022, did not help her, daughter reported seems to make things worse, now has stopped taking it   She was given Ativan 0.5 mg twice daily as needed for agitation, which seems to help,  UPDATE January 15 2022: She lives with one of her daughters, is accompanied by [name] at today's visit, she has been doing very well over the past few months, taking Abilify 2 mg in the morning, Seroquel 25 mg every night, sleeps well, enjoying gardening work, attends church activity regularly,  She continues to have occasional tearing spells, no longer has significant agitation, gait abnormality due to left foot drop, not consistent wearing her left AFO  She fell on November 07, 2021, presented to emergency room, had a CT head, no acute abnormality, CT abdomen showed large hiatal hernia/intrathoracic stomach  UPDATE Jul 13 2022: Hospital admission June 29, 2022, for aggressive behavior, arguing with the family, following fall on her back while getting agitated, Seroquel dosage was increased to 25 mg twice a day, Zoloft 100 mg 2 tablets daily, no longer  Abilify  Personally reviewed CT head no acute intracranial abnormality, no traumatic injury to cervical spine, multiple mild vertebral body compression fracture in the body of T2, T3, T4-T5-T6 T8, worsening compression in the body of T8,  UA reported as hazy, but a culture was negative, negative for alcohol, normal CBC, BMP showed no significant abnormality   Personally reviewed CT of the chest: Large hiatal hernia      PHYSICAL EXAM  Vitals:   01/15/22 1030  BP: 108/65  Pulse: 74  Weight: 137 lb 8 oz (62.4 kg)  Height: 5\' 4"  (1.626 m)   Body mass index is 23.6 kg/m.    01/15/2022   10:35 AM 01/14/2021    7:45 AM  Montreal Cognitive Assessment   Visuospatial/ Executive (0/5) 3 2  Naming (0/3) 3 3  Attention: Read list of digits (0/2) 0 1  Attention: Read list of letters (0/1) 0 1  Attention: Serial 7 subtraction starting at 100 (0/3) 1 1  Language: Repeat phrase (0/2) 2 2  Language : Fluency (0/1) 0 0  Abstraction (0/2) 0 1  Delayed Recall (0/5) 0 0  Orientation (0/6) 3 6  Total 12 17  Adjusted Score (based on education) 13 17     Neurological examination  Mentation: Fragile tired looking elderly female, depend on her daughter for history,  PHYSICAL EXAMNIATION:  Gen:  NAD, conversant, well nourised, well groomed                     Cardiovascular: Regular rate rhythm, no peripheral edema, warm, nontender. Eyes: Conjunctivae clear without exudates or hemorrhage Neck: Supple, no carotid bruits. Pulmonary: Clear to auscultation bilaterally   NEUROLOGICAL EXAM:  MENTAL STATUS: Speech/cognition: Awake, cooperative on examination CRANIAL NERVES: CN II: Visual fields are full to confrontation.  Pupils are round equal and briskly reactive to light. CN III, IV, VI: extraocular movement are normal. No ptosis. CN V: Facial sensation is intact to pinprick in all 3 divisions bilaterally. Corneal responses are intact.  CN VII: Face is symmetric with normal eye closure and  smile. CN VIII: Hearing is normal to casual conversation CN IX, X: Palate elevates symmetrically. Phonation is normal. CN XI: Head turning and shoulder shrug are intact   MOTOR: Profound left ankle dorsiflexion weakness  REFLEXES: Hyperreflexia    COORDINATION: No truncal or limb dysmetria  GAIT/STANCE: Need push-up to get up from seated position, left foot drop, unsteady   REVIEW OF SYSTEMS: Out of a complete 14 system review of symptoms, the patient complains only of the following symptoms, and all other reviewed systems are negative.  See HPI  ALLERGIES: Allergies  Allergen Reactions   Nsaids Other (See Comments)    GI upset    HOME MEDICATIONS: Outpatient Medications Prior to Visit  Medication Sig Dispense Refill   acetaminophen (TYLENOL) 500 MG tablet Take 500 mg by mouth every 6 (six) hours as needed for moderate pain.     aspirin 81 MG tablet Take 81 mg by mouth daily.     Cholecalciferol (VITAMIN D) 125 MCG (5000 UT) CAPS Take 5,000 Units by mouth daily.     doxazosin (CARDURA) 8 MG tablet Take 8 mg by mouth daily.     ferrous sulfate 324 MG TBEC Take 324 mg by mouth.     fluticasone (FLONASE) 50 MCG/ACT nasal spray Place 1 spray into both nostrils daily as needed for allergies or rhinitis.     isosorbide mononitrate (IMDUR) 60 MG 24 hr tablet Take 60 mg by mouth daily.     levothyroxine (SYNTHROID) 75 MCG tablet Take 75 mcg by mouth daily.     lidocaine (LIDODERM) 5 % Place 1 patch onto the skin daily. Remove & Discard patch within 12 hours or as directed by MD 30 patch 0   LORazepam (ATIVAN) 2 MG tablet Take 0.5 tablets (1 mg total) by mouth every evening. (Patient taking differently: Take 1 mg by mouth in the morning and at bedtime.) 15 tablet 0   meclizine (ANTIVERT) 25 MG tablet Take 1 tablet (25 mg total) by mouth 3 (three) times daily as needed for dizziness. 30 tablet 0   memantine (NAMENDA) 10 MG tablet TAKE 1 TABLET BY MOUTH TWICE A DAY 180 tablet 3    Multiple Vitamin (MULTIVITAMIN) tablet Take 1 tablet by mouth every morning.     nitrofurantoin (MACRODANTIN) 100 MG capsule Take 100 mg by mouth daily.     ondansetron (ZOFRAN-ODT) 8 MG disintegrating tablet Take 1 tablet (8 mg total) by mouth every 8 (eight) hours as needed for nausea or vomiting. 20 tablet 0   QUEtiapine (SEROQUEL) 25 MG tablet Take 1 tablet (25 mg total) by mouth at bedtime. (Patient taking differently: Take 25 mg by mouth 2 (two) times daily.) 90 tablet 3   sertraline (ZOLOFT) 100 MG tablet Take 200 mg by mouth daily.  simvastatin (ZOCOR) 40 MG tablet Take 40 mg by mouth at bedtime.     No facility-administered medications prior to visit.    PAST MEDICAL HISTORY: Past Medical History:  Diagnosis Date   Arthritis    Chest pain    Chronic back pain    "neurostimulator electrode leads overlie the lower thoracic spinal canal" - per cxr report 07/10/14   Chronic kidney disease    kidney stone   Chronic UTI (urinary tract infection)    Coronary artery disease    NONOBSTRUCTIVE   Depression    Dizziness    GERD (gastroesophageal reflux disease)    Headache    History of kidney stones    Hyperlipidemia    Hypertension    Left foot drop    SINCE 2009 - RELATED TO BACK PROBLEM - WEARS BRACE   PONV (postoperative nausea and vomiting)    Spinal stenosis    Thyroid disease    hypothryoid    PAST SURGICAL HISTORY: Past Surgical History:  Procedure Laterality Date   BACK SURGERY     CARDIAC CATHETERIZATION  02/22/2009   EF 60%   CATARACT EXTRACTION     both eyes   COLONOSCOPY     CYSTOSCOPY     FOR KIDNEY STONE   LUMBAR LAMINECTOMY/DECOMPRESSION MICRODISCECTOMY     TOTAL HIP ARTHROPLASTY Left 07/17/2014   Procedure: LEFT TOTAL HIP ARTHROPLASTY ANTERIOR APPROACH;  Surgeon: Shelda Pal, MD;  Location: [facility];  Service: Orthopedics;  Laterality: Left;    FAMILY HISTORY: Family History  Problem Relation Age of Onset   Cancer Mother        started in  gallbladder then spread to liver   Heart attack Father    Colon cancer Neg Hx    Esophageal cancer Neg Hx    Rectal cancer Neg Hx    Stomach cancer Neg Hx     SOCIAL HISTORY: Social History   Socioeconomic History   Marital status: Widowed    Spouse name: Not on file   Number of children: 2   Years of education: 12   Highest education level: High school graduate  Occupational History   Occupation: Retired  Tobacco Use   Smoking status: Never   Smokeless tobacco: Never  Vaping Use   Vaping Use: Never used  Substance and Sexual Activity   Alcohol use: No    Alcohol/week: 0.0 standard drinks of alcohol   Drug use: No   Sexual activity: Not on file  Other Topics Concern   Not on file  Social History Narrative   Lives with her granddaughter.   Left-handed.   No caffeine use.   Social Determinants of Health   Financial Resource Strain: Not on file  Food Insecurity: Not on file  Transportation Needs: Not on file  Physical Activity: Not on file  Stress: Not on file  Social Connections: Not on file  Intimate Partner Violence: Not on file      Levert Feinstein, M.D. Ph.D.  [facility] [address] [place], [place] [address] Phone: [phone] Fax:      [phone]

## 2022-07-14 ENCOUNTER — Telehealth: Payer: Self-pay | Admitting: Neurology

## 2022-07-14 NOTE — Telephone Encounter (Signed)
I called patient's daughter, Langley Gauss, per [facility].  I advised her to contact PCP for then referral for hospice care.  Patient's daughter reports that she already spoke with PCP and her PCP deferred to Dr. Krista Blue.  However, she will call PCP office back and explain this to them.

## 2022-07-14 NOTE — Telephone Encounter (Signed)
Pt's daughter, Langley Gauss Cummins(on DPR) requesting a referral to Hospice for Dementia. Would like a call from the nurse

## 2022-07-14 NOTE — Telephone Encounter (Signed)
Please let patient's family know, is usually primary care coordinate hospice care and its referred

## 2022-07-20 NOTE — Telephone Encounter (Signed)
[name] is calling from hospice care. Stated daughter want Dr. Krista Blue to be the hospice attend Provider. [name] said she need for Dr. Krista Blue to certify terminal illness. Do Dr. Krista Blue want to do her own comfort order or would she like hospice provider to do these orders. [name] is requesting a call back from the nurse or provider.

## 2022-07-21 NOTE — Telephone Encounter (Signed)
I [place] back and spoke with [place]. I advised to defer attending physician orders/needs to hospice provider.   She verbalized understanding and appreciation for the call.

## 2022-10-31 ENCOUNTER — Other Ambulatory Visit: Payer: Self-pay | Admitting: Neurology

## 2022-12-25 ENCOUNTER — Telehealth: Payer: Self-pay | Admitting: Neurology

## 2022-12-25 NOTE — Telephone Encounter (Signed)
Pt's daughter called stating that she is needing to discuss the pt's  QUEtiapine (SEROQUEL) 25 MG tablet Rx  She states it was written for 1 pill but the pt is supposed to be taking 1 1/2. Please advise.

## 2022-12-28 NOTE — Telephone Encounter (Signed)
Called daughter Leeanne Mannan. To inform her of Dr. Rhea Belton orders. Daughter verbalized understanding and appreciates the call back.

## 2023-01-13 ENCOUNTER — Ambulatory Visit: Payer: Medicare Other | Admitting: Neurology

## 2023-01-13 ENCOUNTER — Encounter: Payer: Self-pay | Admitting: Neurology

## 2023-01-13 VITALS — BP 125/69 | HR 71 | Ht 64.0 in | Wt 128.1 lb

## 2023-01-13 DIAGNOSIS — F03918 Unspecified dementia, unspecified severity, with other behavioral disturbance: Secondary | ICD-10-CM | POA: Diagnosis not present

## 2023-01-13 MED ORDER — MEMANTINE HCL 10 MG PO TABS: 10.0000 mg | ORAL_TABLET | Freq: Two times a day (BID) | ORAL | 1 refills | Status: DC

## 2023-01-13 MED ORDER — QUETIAPINE FUMARATE 25 MG PO TABS: ORAL_TABLET | ORAL | 1 refills | Status: DC

## 2023-01-13 NOTE — Patient Instructions (Signed)
Continue current medications  PCP can refill going forward

## 2023-01-13 NOTE — Progress Notes (Signed)
ASSESSMENT AND PLAN 82 y.o. year old female    1.  Dementia with agitation -Doing overall well, stable, [facility] 17/30 today, no report of significant behavioral trouble or agitation -Continue Seroquel 25 mg, 1.5 tablets at bedtime, also on Ativan as needed for anxiety from PCP, recommended limiting use, Seroquel has provided the most improvement in mood stability -Continue Namenda 10 mg twice a day -CT head without contrast in February 2023 showed generalized atrophy, small vessel disease, not MRI candidate due to spinal stimulator -Refills provided for 6 months, future refills can come from PCP, will transition care back to primary care, return here for new issues  Meds ordered this encounter  Medications   QUEtiapine (SEROQUEL) 25 MG tablet    Sig: Take 1.5 tablets at bedtime    Dispense:  135 tablet    Refill:  1   memantine (NAMENDA) 10 MG tablet    Sig: Take 1 tablet (10 mg total) by mouth 2 (two) times daily.    Dispense:  180 tablet    Refill:  1     DIAGNOSTIC DATA (LABS, IMAGING, TESTING) - I reviewed patient records, labs, notes, testing and imaging myself where available. CT head without contrast November 07, 2021, small vessel disease, no acute abnormality, mild generalized atrophy  CT abdomen chest, large hiatal hernia/intrathoracic stomach, aortic atherosclerosis   History of present illness: [name] is a 82 year old female, seen in request by her primary care physician Dr. Andrey Campanile, Merlyn Albert for evaluation of sudden onset dizziness, gait abnormality, she is accompanied by her daughter [name], granddaughter Morrie Sheldon are at today's clinical visit.  Initial evaluation was on May 18, 2018.   She had a past medical history of hyperlipidemia, hypertension, depression, her husband passed away in December 19, 2017she now lives with her granddaughter, she had a history of left lumbar radiculopathy, status decompression surgery in the past, with residual left foot drop, gait  abnormality,   On May 11, 2018, she woke up early morning around 6 AM using bathroom, noticed unsteady gait, vertigo, room was spinning, nauseous, she was able to manage to use the bathroom, lying back to sleep again, few hours later, when she tried to get up again, symptoms still present, but had mild improvement, she has been symptomatic since his onset, few days later, when she went out shopping with her sister on May 14, 2018, she suddenly felt nauseous, dizziness, has to sit out waiting for her sister, ambulance was called, she was evaluated at emergency room,   I personally reviewed CTA head and neck on May 14, 2018, 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   Echocardiogram on June 22, 2018 showed ejection fraction 55 to 60%, wall motion was normal,   Laboratory evaluation August 2019, negative troponin, CMP showed elevated glucose 117, showed hemoglobin of 11.7,   She continues to have left foot drop, unsteady gait   UPDATE Sep 17 2020: She is accompanied by her daughter at today's clinical visit, continue to have left foot drop, mild unsteady gait, wearing a  left ankle brace, remain active at church, going out with her friends regularly, still driving, but daughter noticed that she has slow worsening memory loss,   Again personally reviewed CT scan without contrast in October 2019, no acute abnormality, mild supratentorium small vessel disease.   UPDATE January 14 2021: She is tearful during today's interview,  slow worsening memory loss, significant frustration, daughter reported sometimes she becomes so combative, hitting things, she became very frustrated, loses her medicine, lose her keys, blame her family members, which is a change from her baseline, she has always been a capable sweet person   She fell steps, had acute CT head of the brain without contrast in February 2022, no acute abnormality, mild supratentorium small vessel disease   Laboratory evaluation in 2022 showed normal negative vitamin D, TSH, B12, lipid panel, CBC mild anemia hemoglobin of 11.4,, CMP, Falling, combative, hitting, loosing things, loosing her medication, keys, blame other come to house, sweet person, hard on her.   She was given a trial of Aricept in March 2022, did not help her, daughter reported seems to make things worse, now has stopped taking it   She was given Ativan 0.5 mg twice daily as needed for agitation, which seems to help,  UPDATE January 15 2022: She lives with one of her daughters, is accompanied by [name] at today's visit, she has been doing very well over the past few months, taking Abilify 2 mg in the morning, Seroquel 25 mg every night, sleeps well, enjoying gardening work, attends church activity regularly,  She continues to have occasional tearing spells, no longer has significant agitation, gait abnormality due to left foot drop, not consistent wearing her left AFO  She fell on November 07, 2021, presented to emergency room, had a CT head, no acute abnormality, CT abdomen showed large hiatal hernia/intrathoracic stomach  UPDATE Jul 13 2022: Hospital admission June 29, 2022, for aggressive behavior, arguing with the family, following fall on her back while getting agitated, Seroquel dosage was increased to 25 mg twice a day, Zoloft 100 mg 2 tablets daily, no longer Abilify  Personally reviewed CT head no acute intracranial abnormality, no traumatic injury to cervical spine, multiple mild vertebral  body compression fracture in the body of T2, T3, T4-T5-T6 T8, worsening compression in the body of T8,  UA reported as hazy, but a culture was negative, negative for alcohol, normal CBC, BMP showed no significant abnormality  Personally reviewed CT of the chest: Large hiatal hernia   Update January 13, 2023 SS:  [facility] 17/30, with daughter Corrie Dandy. Daughter lives with her. Things are going well. She is on Seroquel, takes 25 mg 1.5 tablets at bedtime. Agitation is better, sleeping well. Getting around okay, no falls, only uses walker if she goes out. Moving around during the day, cleans, picks up, does laundry. Has lost 3 bottles of Ativan, hiding worry someone may come in to take it. PCP gives Ativan PRN for anxiety. On Namenda 10 mg BID.    PHYSICAL EXAM  Vitals:   01/15/22 1030  BP: 108/65  Pulse: 74  Weight: 137 lb 8 oz (62.4 kg)  Height:  (1.626 m)   Body mass index is 23.6 kg/m.    01/15/2022   10:35 AM 01/14/2021    7:45 AM  Montreal Cognitive Assessment   Visuospatial/ Executive (0/5) 3 2  Naming (0/3) 3 3  Attention: Read list of digits (0/2) 0 1  Attention: Read list of letters (0/1) 0 1  Attention: Serial 7 subtraction starting at 100 (0/3) 1 1  Language: Repeat phrase (0/2) 2 2  Language : Fluency (0/1) 0 0  Abstraction (0/2) 0 1  Delayed Recall (0/5) 0 0  Orientation (0/6) 3 6  Total 12 17  Adjusted Score (based on education) 13 17    Physical Exam  General: The patient is alert and cooperative at the time of the examination.  Skin: No significant peripheral edema is noted.  Neurologic Exam  Mental status: The patient is alert, cooperative, very pleasant.  Relies on her daughter for history.  Cranial nerves: Facial symmetry is present. Speech is normal, no aphasia or dysarthria is noted. Extraocular movements are full. Visual fields are full.  Motor: The patient has good strength in all 4 extremities.  Left foot drop.  Sensory examination: Soft touch  sensation is symmetric on the face, arms, and legs.  Coordination: The patient has good finger-nose-finger and heel-to-shin bilaterally.  Gait and station: The patient has a normal wide-based cautious gait.  Uses 2 wheeled walker in the hallway  Reflexes: Deep tendon reflexes are symmetric.  REVIEW OF SYSTEMS: Out of a complete 14 system review of symptoms, the patient complains only of the following symptoms, and all other reviewed systems are negative.  See HPI  ALLERGIES: Allergies  Allergen Reactions   Nsaids Other (See Comments)    GI upset  other    HOME MEDICATIONS: Outpatient Medications Prior to Visit  Medication Sig Dispense Refill   acetaminophen (TYLENOL) 500 MG tablet Take 500 mg by mouth every 6 (six) hours as needed for moderate pain.     aspirin 81 MG tablet Take 81 mg by mouth daily.     Cholecalciferol (VITAMIN D) 125 MCG (5000 UT) CAPS Take 5,000 Units by mouth daily.     doxazosin (CARDURA) 8 MG tablet Take 8 mg by mouth daily.     ferrous sulfate 324 MG TBEC Take 324 mg by mouth.     fluticasone (FLONASE) 50 MCG/ACT nasal spray Place 1 spray into both nostrils daily as needed for allergies or rhinitis.     isosorbide mononitrate (IMDUR) 60 MG 24 hr tablet Take 60 mg by mouth daily.     levothyroxine (SYNTHROID) 75 MCG tablet Take 75 mcg by mouth daily.     lidocaine (LIDODERM) 5 % Place 1 patch onto the skin daily. Remove & Discard patch within 12 hours or as directed by MD 30 patch 0   LORazepam (ATIVAN) 2 MG tablet Take 0.5 tablets (1 mg total) by mouth every evening. (Patient taking differently: Take 1 mg by mouth in the morning and at bedtime.) 15 tablet 0   meclizine (ANTIVERT) 25 MG tablet Take 1 tablet (25 mg total) by mouth 3 (three) times daily as needed for dizziness. 30 tablet 0   memantine (NAMENDA) 10 MG tablet TAKE 1 TABLET BY MOUTH TWICE A DAY 180 tablet 3   Multiple Vitamin (MULTIVITAMIN) tablet Take 1 tablet by mouth every morning.      nitrofurantoin (MACRODANTIN) 100 MG capsule Take 100 mg by mouth daily.     ondansetron (ZOFRAN-ODT) 8 MG disintegrating tablet Take 1 tablet (8 mg total) by mouth every 8 (eight) hours as needed for nausea or vomiting. 20 tablet 0   QUEtiapine (SEROQUEL) 25 MG tablet Take 1 tablet (25 mg total) by mouth at bedtime. (Patient taking differently: Take 25 mg by mouth 2 (two) times daily.) 90 tablet 3   sertraline (ZOLOFT) 100 MG tablet  Take 200 mg by mouth daily.     simvastatin (ZOCOR) 40 MG tablet Take 40 mg by mouth at bedtime.     pantoprazole (PROTONIX) 20 MG tablet Take 1 tablet (20 mg total) by mouth daily as needed. (Patient not taking: Reported on 01/13/2023) 30 tablet 6   No facility-administered medications prior to visit.    PAST MEDICAL HISTORY: Past Medical History:  Diagnosis Date   Arthritis    Chest pain    Chronic back pain    "neurostimulator electrode leads overlie the lower thoracic spinal canal" - per cxr report 07/10/14   Chronic kidney disease    kidney stone   Chronic UTI (urinary tract infection)    Coronary artery disease    NONOBSTRUCTIVE   Depression    Dizziness    GERD (gastroesophageal reflux disease)    Headache    History of kidney stones    Hyperlipidemia    Hypertension    Left foot drop    SINCE 2009 - RELATED TO BACK PROBLEM - WEARS BRACE   PONV (postoperative nausea and vomiting)    Spinal stenosis    Thyroid disease    hypothryoid    PAST SURGICAL HISTORY: Past Surgical History:  Procedure Laterality Date   BACK SURGERY     CARDIAC CATHETERIZATION  02/22/2009   EF 60%   CATARACT EXTRACTION     both eyes   COLONOSCOPY     CYSTOSCOPY     FOR KIDNEY STONE   LUMBAR LAMINECTOMY/DECOMPRESSION MICRODISCECTOMY     TOTAL HIP ARTHROPLASTY Left 07/17/2014   Procedure: LEFT TOTAL HIP ARTHROPLASTY ANTERIOR APPROACH;  Surgeon: Shelda Pal, MD;  Location: [facility];  Service: Orthopedics;  Laterality: Left;    FAMILY HISTORY: Family History   Problem Relation Age of Onset   Cancer Mother        started in gallbladder then spread to liver   Heart attack Father    Colon cancer Neg Hx    Esophageal cancer Neg Hx    Rectal cancer Neg Hx    Stomach cancer Neg Hx     SOCIAL HISTORY: Social History   Socioeconomic History   Marital status: Widowed    Spouse name: Not on file   Number of children: 2   Years of education: 12   Highest education level: High school graduate  Occupational History   Occupation: Retired  Tobacco Use   Smoking status: Never   Smokeless tobacco: Never  Vaping Use   Vaping Use: Never used  Substance and Sexual Activity   Alcohol use: No    Alcohol/week: 0.0 standard drinks of alcohol   Drug use: No   Sexual activity: Not Currently  Other Topics Concern   Not on file  Social History Narrative   Lives with her granddaughter.   Left-handed.   No caffeine use.   Social Determinants of Health   Financial Resource Strain: Not on file  Food Insecurity: Not on file  Transportation Needs: Not on file  Physical Activity: Not on file  Stress: Not on file  Social Connections: Not on file  Intimate Partner Violence: Not on file      Margie Ege, Edrick Oh, DNP  [facility] [address] [place], [place] [address] [phone]

## 2023-04-28 ENCOUNTER — Telehealth: Payer: Self-pay | Admitting: Neurology

## 2023-04-28 MED ORDER — QUETIAPINE FUMARATE 25 MG PO TABS: ORAL_TABLET | ORAL | 1 refills | Status: DC

## 2023-04-28 MED ORDER — LORAZEPAM 2 MG PO TABS: 1.0000 mg | ORAL_TABLET | Freq: Every evening | ORAL | 3 refills | Status: DC

## 2023-04-28 NOTE — Telephone Encounter (Signed)
Requested Prescriptions   Pending Prescriptions Disp Refills   LORazepam (ATIVAN) 2 MG tablet 15 tablet 0    Sig: Take 0.5 tablets (1 mg total) by mouth every evening.   QUEtiapine (SEROQUEL) 25 MG tablet 135 tablet 1    Sig: Take 1.5 tablets at bedtime  Pt stated that she has misplaced medication are we able to early fill? Dispenses   Dispensed Days Supply Quantity Provider Pharmacy  LORAZEPAM 2 MG TABLET 04/12/2023 31 47 each Richmond Campbell., PA-C [facility] [phone] - S...  LORAZEPAM 2 MG TABLET 03/11/2023 31 47 each Richmond Campbell., PA-C [facility] [phone] - S...  LORAZEPAM 2 MG TABLET 02/10/2023 31 47 each Richmond Campbell., PA-C [facility] [phone] - S...  LORAZEPAM 2 MG TABLET 01/12/2023 30 47 each Richmond Campbell., PA-C [facility] [phone] - S...  LORAZEPAM 2 MG TABLET 12/09/2022 30 47 each Richmond Campbell., PA-C [facility] [phone] - S...  LORAZEPAM 2 MG TABLET 07/14/2022 30 47 each Richmond Campbell., PA-C [facility] [phone] - S...  LORAZEPAM 2 MG TABLET 07/05/2022 30 15 each Dohmeier, Porfirio Mylar, MD [facility] [phone] - S...  LORAZEPAM 2 MG TABLET 06/08/2022 30 47 each Richmond Campbell., PA-C [facility] [phone] - S...  LORAZEPAM 2 MG TABLET 05/11/2022 30 47 each Richmond Campbell., PA-C [facility] [phone] - S...    Dispenses    Dispensed Days Supply Quantity Provider Pharmacy  QUETIAPINE FUMARATE 25 MG TAB 04/22/2023 9 15 each Glean Salvo, NP [facility] [phone] - S...  QUETIAPINE FUMARATE 25 MG TAB 04/12/2023 13 20 each Glean Salvo, NP [facility] [phone] - S...  QUETIAPINE FUMARATE 25 MG TAB 04/03/2023 13 20 each Glean Salvo, NP [facility] [phone] - S...  QUETIAPINE FUMARATE 25 MG TAB 03/29/2023 13 20 each Glean Salvo, NP [facility] [phone] - S...  QUETIAPINE FUMARATE 25 MG TAB 03/22/2023 13 20 each Glean Salvo, NP [facility] [phone] - S...  QUETIAPINE FUMARATE 25 MG TAB 03/09/2023 13 20 each Glean Salvo, NP [facility] [phone] - S...  QUETIAPINE  FUMARATE 25 MG TAB 02/25/2023 13 20 each Glean Salvo, NP [facility] [phone] - S...  QUETIAPINE FUMARATE 25 MG TAB 01/13/2023 90 135 each Glean Salvo, NP [facility] [phone] - S...  QUETIAPINE FUMARATE 25 MG TAB 01/05/2023 15 15 each Dohmeier, Porfirio Mylar, MD [facility] [phone] - S...  QUETIAPINE FUMARATE 25 MG TAB 12/25/2022 15 15 each Dohmeier, Porfirio Mylar, MD [facility] [phone] - S...  QUETIAPINE FUMARATE 25 MG TAB 12/16/2022 15 15 each Dohmeier, Porfirio Mylar, MD [facility] [phone] - S...  QUETIAPINE FUMARATE 25 MG TAB 11/08/2022 90 90 each Dohmeier, Porfirio Mylar, MD [facility] [phone] - S...  QUETIAPINE FUMARATE 25 MG TAB 08/04/2022 90 180 each Dohmeier, Porfirio Mylar, MD [facility] [phone] - S...  QUETIAPINE FUMARATE 25 MG TAB 07/29/2022 20 20 each Dohmeier, Porfirio Mylar, MD [facility] [phone] - S...  QUETIAPINE FUMARATE 25 MG TAB 06/26/2022 90 90 each Dohmeier, Porfirio Mylar, MD [facility] [phone] - S...  QUETIAPINE FUMARATE 25 MG TAB 06/23/2022 20 20 each Levert Feinstein, MD [facility] [phone] - S...  QUETIAPINE FUMARATE 25 MG TAB 06/19/2022 10 10 each Levert Feinstein, MD [facility] [phone] - S...  QUETIAPINE FUMARATE 25 MG TAB 06/17/2022 1 1 each Levert Feinstein, MD [facility] [phone] - S...  QUETIAPINE FUMARATE 25 MG TAB 06/12/2022 10 10 each Levert Feinstein, MD [facility] [phone] - S...  QUETIAPINE FUMARATE 25 MG TAB 06/07/2022 10 10 each Levert Feinstein, MD [facility] [phone] - S...  QUETIAPINE FUMARATE 25 MG TAB 05/30/2022 10 10 each Levert Feinstein,  MD [facility] [phone] - S.Marland KitchenMarland Kitchen

## 2023-04-28 NOTE — Telephone Encounter (Signed)
Daughter reports pt has misplaced the LORazepam (ATIVAN) 2 MG tablet and daughter has no idea as to where it is.  Regarding the QUEtiapine (SEROQUEL) 25 MG tablet she states the pill is so small she has to break it in 1/2  and give to pt.  She would like a call to discuss this with RN

## 2023-04-28 NOTE — Telephone Encounter (Signed)
Meds ordered this encounter  Medications   LORazepam (ATIVAN) 2 MG tablet    Sig: Take 0.5 tablets (1 mg total) by mouth every evening.    Dispense:  15 tablet    Refill:  3   QUEtiapine (SEROQUEL) 25 MG tablet    Sig: Take 1.5 tablets at bedtime    Dispense:  135 tablet    Refill:  1

## 2023-04-29 NOTE — Telephone Encounter (Signed)
Called and spoke to pt daughter and we sent new rx. She stated that we needed to call the pharmacy to verify ok to send

## 2023-04-29 NOTE — Telephone Encounter (Signed)
Called and lvm for pharmacy stating okay to fill early

## 2023-04-29 NOTE — Telephone Encounter (Signed)
Daughter has called to report that the pharmacy informed her that there is no fill date on the Rx

## 2023-05-24 ENCOUNTER — Other Ambulatory Visit: Payer: Self-pay | Admitting: Neurology

## 2023-05-24 NOTE — Telephone Encounter (Signed)
[name], daughter of pt called and LVM stating that the pt's PCP called in an Rx for the pt's  LORazepam (ATIVAN) 2 MG tablet and she states that Dr. Terrace Arabia is needing to call the pharmacy to give the ok for the PCP to fill the Rx.

## 2023-05-24 NOTE — Telephone Encounter (Signed)
Requested Prescriptions   Pending Prescriptions Disp Refills   LORazepam (ATIVAN) 2 MG tablet 15 tablet 3    Sig: Take 0.5 tablets (1 mg total) by mouth every evening.   PHONE ROOM: PLEASE KINDLY INFORM THE PT THAT WE CANNOT AUTHORIZE ANYONE TO FILL UNTIL 05/30/23 THEY WOULD BE FILLING EARLY AS WE SEE THEY PICKED UP 30 DAY SUPPLY ON 04/29/23 Last seen 01/13/23, next appt scheduled 07/20/23  Dispenses   Dispensed Days Supply Quantity Provider Pharmacy  LORAZEPAM 2 MG TABLET 04/29/2023 30 15 each Levert Feinstein, MD [facility] [phone] - S...  LORAZEPAM 2 MG TABLET 04/12/2023 31 47 each Richmond Campbell., PA-C [facility] [phone] - S...  LORAZEPAM 2 MG TABLET 03/11/2023 31 47 each Richmond Campbell., PA-C [facility] [phone] - S...  LORAZEPAM 2 MG TABLET 02/10/2023 31 47 each Richmond Campbell., PA-C [facility] [phone] - S...  LORAZEPAM 2 MG TABLET 01/12/2023 30 47 each Richmond Campbell., PA-C [facility] [phone] - S...  LORAZEPAM 2 MG TABLET 12/09/2022 30 47 each Richmond Campbell., PA-C [facility] [phone] - S...  LORAZEPAM 2 MG TABLET 07/14/2022 30 47 each Richmond Campbell., PA-C [facility] [phone] - S...  LORAZEPAM 2 MG TABLET 07/05/2022 30 15 each Dohmeier, Porfirio Mylar, MD [facility] [phone] - S...  LORAZEPAM 2 MG TABLET 06/08/2022 30 47 each Richmond Campbell., PA-C [facility] [phone] - S.Marland KitchenMarland Kitchen

## 2023-05-24 NOTE — Addendum Note (Signed)
Addended by: Danne Harbor on: 05/24/2023 03:21 PM   Modules accepted: Orders

## 2023-05-24 NOTE — Telephone Encounter (Signed)
Total: 27  Private Pay: 3  Showing 1-15 of 27 Items View    of 2  Filled  Written  Sold  ID  Drug  QTY  Days  Prescriber  RX #  Dispenser  Refill  Daily Dose*  Pymt Type  PMP   04/29/2023 04/28/2023  1 Lorazepam 2 Mg Tablet 15.00 30 Roney Jaffe [id number] Nor (3235) 0/3 1.00 LME Private Pay [place]  04/12/2023 04/12/2023  1 Lorazepam 2 Mg Tablet 47.00 31 Kr Kap [id number] Nor (3235) 0/1 3.03 LME Medicare [place]  03/11/2023 02/10/2023  1 Lorazepam 2 Mg Tablet 47.00 31 Kr Kap [id number] Nor (3235) 1/1 3.03 LME Medicare [place]  02/26/2023 02/26/2023  1 Tramadol Hcl 50 Mg Tablet 15.00 5 Da Bro [id number] Nor (3235) 0/0 30.00 MME Medicare [place]  02/10/2023 02/10/2023  1 Lorazepam 2 Mg Tablet 47.00 31 Kr Kap [id number] Nor (3235) 0/1 3.03 LME Medicare [place]  01/12/2023 12/09/2022  1 Lorazepam 2 Mg Tablet 47.00 30 Kr Kap [id number] Nor (3235) 1/1 3.13 LME Medicare [place]  12/09/2022 12/09/2022  1 Lorazepam 2 Mg Tablet 47.00 30 Kr Kap [id number] Nor (3235) 0/1 3.13 LME Medicare [place]  07/14/2022 07/13/2022  1 Lorazepam 2 Mg Tablet 47.00 30 Kr Kap [id number] Nor (3235) 0/1 3.13 LME Medicare [place]  07/05/2022 06/26/2022  1 Lorazepam 2 Mg Tablet 15.00 30 Ca Doh [id number] Nor (3235) 0/0 1.00 LME Medicare [place]   Lorazepam was filled by her PCP multiple times in the past.  Family should contact PCP for refill

## 2023-06-16 MED ORDER — LORAZEPAM 2 MG PO TABS: 1.0000 mg | ORAL_TABLET | Freq: Every evening | ORAL | 3 refills | Status: DC

## 2023-06-21 ENCOUNTER — Telehealth: Payer: Self-pay

## 2023-06-21 NOTE — Telephone Encounter (Signed)
I spoke with [name], patients daughter, to try and schedule a sooner follow up appointment. During that phone call, [name] asked if we have any recommendations regarding support groups in the area. I advised that I was not familiar with any but that I would check with the clinical staff.  Please advise if there are any local support groups you would recommend for the patients daughter  Thanks!

## 2023-06-21 NOTE — Telephone Encounter (Signed)
[employer], Caregiver support group. Good information on the website. Thanks   We were planning to transition back to PCP.

## 2023-06-28 ENCOUNTER — Telehealth: Payer: Self-pay | Admitting: Neurology

## 2023-06-28 MED ORDER — LORAZEPAM 2 MG PO TABS: 2.0000 mg | ORAL_TABLET | Freq: Every evening | ORAL | 3 refills | Status: DC

## 2023-06-28 MED ORDER — QUETIAPINE FUMARATE 25 MG PO TABS: 37.5000 mg | ORAL_TABLET | Freq: Every day | ORAL | 1 refills | Status: DC

## 2023-06-28 NOTE — Telephone Encounter (Signed)
Pt's daughter, Beverely Pace; When I came back this weekend, mother's LORazepam (ATIVAN) 2 MG tablet  was gone. The bottle was empty, do not think she took them. Would like a call back. Could Dr. Terrace Arabia send another refill. Mother thinks some one will come in and take her medication so she hides it. Going to the police to make sure ok for me to lock up mother's medication. Ms. Cecilio Asper will lock up mother's medication going forward. Can not handle her when she can not take her medication. [id number]

## 2023-06-28 NOTE — Telephone Encounter (Signed)
Reviewed, she is getting lorazepam from 2 providers, 62 tablets of 2 mg in August,  15 tablets in September  I have given her 30 tablets at today's prescription,  If current dose of lorazepam does not help her, will not increase her dosage anymore, please advise family to try higher dose of Seroquel  Meds ordered this encounter  Medications   QUEtiapine (SEROQUEL) 25 MG tablet    Sig: Take 1.5 tablets (37.5 mg total) by mouth at bedtime. Take 1.5 tablets at bedtime    Dispense:  135 tablet    Refill:  1   LORazepam (ATIVAN) 2 MG tablet    Sig: Take 1 tablet (2 mg total) by mouth every evening.    Dispense:  30 tablet    Refill:  3     Filled  Written  Sold  ID  Drug  QTY  Days  Prescriber  RX #  Dispenser  Refill  Daily Dose*  Pymt Type  PMP   06/24/2023 04/28/2023  1 Lorazepam 2 Mg Tablet 15.00 30 Roney Jaffe [id number] Nor (3235) 1/3 1.00 LME Private Pay [place]  05/26/2023 04/12/2023  1 Lorazepam 2 Mg Tablet 47.00 31 Kr Kap [id number] Nor (3235) 1/1 3.03 LME Medicare [place]  04/29/2023 04/28/2023  1 Lorazepam 2 Mg Tablet 15.00 30 Roney Jaffe [id number] Nor (3235) 0/3 1.00 LME Private Pay [place]  04/12/2023 04/12/2023  1 Lorazepam 2 Mg Tablet 47.00 31 Kr Kap [id number] Nor (3235) 0/1 3.03 LME Medicare [place]  03/11/2023 02/10/2023  1 Lorazepam 2 Mg Tablet 47.00 31 Kr Kap [id number] Nor (3235) 1/1 3.03 LME Medicare [place]  02/26/2023 02/26/2023  1 Tramadol Hcl 50 Mg Tablet 15.00 5 Da Bro [id number] Nor (3235) 0/0 30.00 MME Medicare [place]  02/10/2023 02/10/2023  1 Lorazepam 2 Mg Tablet 47.00 31 Kr Kap [id number] Nor (3235) 0/1 3.03 LME Medicare [place]  01/12/2023 12/09/2022  1 Lorazepam 2 Mg Tablet 47.00 30 Kr Kap [id number] Nor (3235) 1/1 3.13 LME Medicare [place]  12/09/2022 12/09/2022  1 Lorazepam 2 Mg Tablet 47.00 30 Kr Kap [id number] Nor (3235) 0/1 3.13 LME Medicare [place]  07/14/2022 07/13/2022  1 Lorazepam 2 Mg Tablet 47.00 30 Kr Kap [id number] Nor (3235) 0/1 3.13 LME Medicare [place]  07/05/2022 06/26/2022  1 Lorazepam 2 Mg Tablet 15.00 30 Ca Doh [id number] Nor  (3235) 0/0 1.00 LME Medicare [place]  06/08/2022 05/11/2022  1 Lorazepam 2 Mg Tablet 47.00 30 Kr Kap [id number] Nor (3235) 1/1 3.13 LME Medicare [place]  06/02/2022 06/02/2022  1 Tramadol Hcl 50 Mg Tablet 40.00 10 Ro Gio [id number] Nor (3235) 0/0 40.00 MME Medicare [place]  05/25/2022 05/25/2022  1 Tramadol Hcl 50 Mg Tablet 30.00 7 Jo Mol [id number] Nor (3235) 0/0 42.86 MME Medicare [place]  05/11/2022 05/11/2022  1 Lorazepam 2 Mg Tablet 47.00 30 Kr Kap [id number] Nor (3235) 0/1 3.13 LME Medicare [place]

## 2023-06-28 NOTE — Addendum Note (Signed)
Addended by: Levert Feinstein on: 06/28/2023 02:15 PM   Modules accepted: Orders

## 2023-06-28 NOTE — Telephone Encounter (Signed)
Upon chart review this is the second time medication has been misplaced by the patient on 04/28/23 Seroquel had to be filled early due to misplacing it.

## 2023-06-28 NOTE — Telephone Encounter (Signed)
Called and relayed information to daughter, she reports they are transferring care from that provider to here and are scheduled for follow up on 10.22. She was understanding and needed nothing else at this time

## 2023-06-28 NOTE — Telephone Encounter (Signed)
Meds ordered this encounter  Medications   QUEtiapine (SEROQUEL) 25 MG tablet    Sig: Take 1.5 tablets (37.5 mg total) by mouth at bedtime. Take 1.5 tablets at bedtime    Dispense:  135 tablet    Refill:  1

## 2023-06-28 NOTE — Addendum Note (Signed)
Addended by: Levert Feinstein on: 06/28/2023 02:30 PM   Modules accepted: Orders

## 2023-07-14 ENCOUNTER — Encounter (HOSPITAL_COMMUNITY): Payer: Self-pay | Admitting: Emergency Medicine

## 2023-07-14 ENCOUNTER — Emergency Department (HOSPITAL_COMMUNITY): Payer: Medicare Other

## 2023-07-14 ENCOUNTER — Other Ambulatory Visit: Payer: Self-pay

## 2023-07-14 ENCOUNTER — Emergency Department (HOSPITAL_COMMUNITY)
Admission: EM | Admit: 2023-07-14 | Discharge: 2023-07-14 | Disposition: A | Discharge status: Home / Self Care | Payer: Medicare Other

## 2023-07-14 DIAGNOSIS — I10 Essential (primary) hypertension: Secondary | ICD-10-CM | POA: Insufficient documentation

## 2023-07-14 DIAGNOSIS — R350 Frequency of micturition: Secondary | ICD-10-CM | POA: Insufficient documentation

## 2023-07-14 DIAGNOSIS — R109 Unspecified abdominal pain: Secondary | ICD-10-CM | POA: Insufficient documentation

## 2023-07-14 DIAGNOSIS — Z7982 Long term (current) use of aspirin: Secondary | ICD-10-CM | POA: Insufficient documentation

## 2023-07-14 DIAGNOSIS — R410 Disorientation, unspecified: Secondary | ICD-10-CM | POA: Diagnosis present

## 2023-07-14 DIAGNOSIS — Z79899 Other long term (current) drug therapy: Secondary | ICD-10-CM | POA: Insufficient documentation

## 2023-07-14 DIAGNOSIS — R3915 Urgency of urination: Secondary | ICD-10-CM | POA: Diagnosis not present

## 2023-07-14 DIAGNOSIS — F0390 Unspecified dementia without behavioral disturbance: Secondary | ICD-10-CM | POA: Diagnosis not present

## 2023-07-14 LAB — COMPREHENSIVE METABOLIC PANEL
ALT: 22 U/L (ref 0–44)
AST: 29 U/L (ref 15–41)
Albumin: 4.4 g/dL (ref 3.5–5.0)
Alkaline Phosphatase: 108 U/L (ref 38–126)
Anion gap: 10 (ref 5–15)
BUN: 20 mg/dL (ref 8–23)
CO2: 25 mmol/L (ref 22–32)
Calcium: 9.5 mg/dL (ref 8.9–10.3)
Chloride: 105 mmol/L (ref 98–111)
Creatinine, Ser: 0.71 mg/dL (ref 0.44–1.00)
GFR, Estimated: >60 mL/min (ref >60)
Glucose, Bld: 90 mg/dL (ref 70–99)
Potassium: 4 mmol/L (ref 3.5–5.1)
Sodium: 140 mmol/L (ref 135–145)
Total Bilirubin: 0.8 mg/dL (ref 0.3–1.2)
Total Protein: 7.3 g/dL (ref 6.5–8.1)

## 2023-07-14 LAB — URINALYSIS, ROUTINE W REFLEX MICROSCOPIC
Bacteria, UA: NONE SEEN
Bilirubin Urine: NEGATIVE
Glucose, UA: NEGATIVE mg/dL
Hgb urine dipstick: NEGATIVE
Ketones, ur: NEGATIVE mg/dL
Nitrite: NEGATIVE
Protein, ur: NEGATIVE mg/dL
Specific Gravity, Urine: 1.016 (ref 1.005–1.030)
pH: 5 (ref 5.0–8.0)

## 2023-07-14 LAB — CBC WITH DIFFERENTIAL/PLATELET
Abs Immature Granulocytes: 0.03 10*3/uL (ref 0.00–0.07)
Basophils Absolute: 0 10*3/uL (ref 0.0–0.1)
Basophils Relative: 0 %
Eosinophils Absolute: 0.1 10*3/uL (ref 0.0–0.5)
Eosinophils Relative: 2 %
HCT: 41.1 % (ref 36.0–46.0)
Hemoglobin: 13.3 g/dL (ref 12.0–15.0)
Immature Granulocytes: 1 %
Lymphocytes Relative: 23 %
Lymphs Abs: 1.3 10*3/uL (ref 0.7–4.0)
MCH: 32.5 pg (ref 26.0–34.0)
MCHC: 32.4 g/dL (ref 30.0–36.0)
MCV: 100.5 fL — ABNORMAL HIGH (ref 80.0–100.0)
Monocytes Absolute: 0.2 10*3/uL (ref 0.1–1.0)
Monocytes Relative: 4 %
Neutro Abs: 4.1 10*3/uL (ref 1.7–7.7)
Neutrophils Relative %: 70 %
Platelets: 168 10*3/uL (ref 150–400)
RBC: 4.09 MIL/uL (ref 3.87–5.11)
RDW: 12.1 % (ref 11.5–15.5)
WBC: 5.7 10*3/uL (ref 4.0–10.5)
nRBC: 0 % (ref 0.0–0.2)

## 2023-07-14 MED ORDER — SODIUM CHLORIDE (PF) 0.9 % IJ SOLN
INTRAMUSCULAR | Status: AC
  Filled 2023-07-14: qty 50

## 2023-07-14 MED ORDER — HALOPERIDOL LACTATE 5 MG/ML IJ SOLN
5.0000 mg | Freq: Once | INTRAMUSCULAR | Status: AC
  Administered 2023-07-14: 5 mg via INTRAMUSCULAR
  Filled 2023-07-14: qty 1

## 2023-07-14 MED ORDER — IOHEXOL 300 MG/ML  SOLN
100.0000 mL | Freq: Once | INTRAMUSCULAR | Status: AC | PRN
  Administered 2023-07-14: 100 mL via INTRAVENOUS

## 2023-07-14 MED ORDER — IOHEXOL 350 MG/ML SOLN: 100.0000 mL | Freq: Once | INTRAVENOUS | Status: DC | PRN

## 2023-07-14 NOTE — ED Provider Notes (Addendum)
  Physical Exam  BP (!) 146/78   Pulse 66   Temp 98.6 F (37 C) (Oral)   Resp 17   SpO2 94%   Physical Exam  Procedures  Procedures  ED Course / MDM    Medical Decision Making Amount and/or Complexity of Data Reviewed Labs: ordered. Radiology: ordered.  Risk Prescription drug management.   Assuming care of patient at 4 pm. PT has hx of dementia. She has been c/o abd pain the last few days. Ct ordered and pending.  Ua is neg. Family updated.     Derwood Kaplan, MD 07/14/23 1620  8:33 PM Daughter at the bedside. Results discussed. Advised pcp f/u. Neuro appt coming up.  Agree, likely dementia related changes.  The patient appears reasonably screened and/or stabilized for discharge and I doubt any other medical condition or other [facility] requiring further screening, evaluation, or treatment in the ED at this time prior to discharge.   Results from the ER workup discussed with the patient face to face and all questions answered to the best of my ability. The patient is safe for discharge with strict return precautions.    Derwood Kaplan, MD 07/14/23 2034

## 2023-07-14 NOTE — Discharge Instructions (Signed)
All the results in the ER are normal, labs and imaging. CT head and abdomen and pelvis are reassuring.  We are not sure what is causing your symptoms. The workup in the ER is not complete, and is limited to screening for life threatening and emergent conditions only, so please see a primary care doctor for further evaluation.

## 2023-07-14 NOTE — ED Notes (Signed)
Patient and family are aware that a  urine sample is needed.

## 2023-07-14 NOTE — ED Provider Notes (Signed)
[place] EMERGENCY DEPARTMENT AT [facility] Provider Note   CSN: [id number] Arrival date & time: 07/14/23  1245     History  Chief Complaint  Patient presents with   Altered Mental Status    [name] is a 82 y.o. female.  82 year old female with past medical history of dementia and hypertension presenting to the emergency department today with apparent confusion and agitation at home.  The patient is resting comfortably in the room here.  She is telling me that she has been having some urinary symptoms.  She does report some increased urinary frequency and urgency over the past few days.  She tells me that she was treated with antibiotics a few weeks ago.  She states that her symptoms have worsened over the past few days.  She denies any flank pain.  Denies any nausea or vomiting.  She denies any other symptoms at this time.   Altered Mental Status      Home Medications Prior to Admission medications   Medication Sig Start Date End Date Taking? Authorizing Provider  acetaminophen (TYLENOL) 500 MG tablet Take 500 mg by mouth every 6 (six) hours as needed for moderate pain.    [provider]  aspirin 81 MG tablet Take 81 mg by mouth daily.    [provider]  Cholecalciferol (VITAMIN D) 125 MCG (5000 UT) CAPS Take 5,000 Units by mouth daily.    [provider]  doxazosin (CARDURA) 8 MG tablet Take 8 mg by mouth daily.    [provider]  ferrous sulfate 324 MG TBEC Take 324 mg by mouth.    [provider]  fluticasone (FLONASE) 50 MCG/ACT nasal spray Place 1 spray into both nostrils daily as needed for allergies or rhinitis.    [provider]  isosorbide mononitrate (IMDUR) 60 MG 24 hr tablet Take 60 mg by mouth daily.    [provider]  levothyroxine (SYNTHROID) 75 MCG tablet Take 75 mcg by mouth daily. 12/18/20   [provider]  lidocaine (LIDODERM) 5 % Place 1 patch onto the skin  daily. Remove & Discard patch within 12 hours or as directed by MD 11/07/21   Couture, Cortni S, PA-C  LORazepam (ATIVAN) 2 MG tablet Take 1 tablet (2 mg total) by mouth every evening. 06/28/23   Levert Feinstein, MD  meclizine (ANTIVERT) 25 MG tablet Take 1 tablet (25 mg total) by mouth 3 (three) times daily as needed for dizziness. 06/11/22   Dione Booze, MD  memantine (NAMENDA) 10 MG tablet Take 1 tablet (10 mg total) by mouth 2 (two) times daily. 01/13/23   Glean Salvo, NP  Multiple Vitamin (MULTIVITAMIN) tablet Take 1 tablet by mouth every morning.    [provider]  nitrofurantoin (MACRODANTIN) 100 MG capsule Take 100 mg by mouth daily. 05/12/22   [provider]  ondansetron (ZOFRAN-ODT) 8 MG disintegrating tablet Take 1 tablet (8 mg total) by mouth every 8 (eight) hours as needed for nausea or vomiting. 06/11/22   Dione Booze, MD  QUEtiapine (SEROQUEL) 25 MG tablet Take 1.5 tablets (37.5 mg total) by mouth at bedtime. Take 1.5 tablets at bedtime 06/28/23   Levert Feinstein, MD  sertraline (ZOLOFT) 100 MG tablet Take 200 mg by mouth daily.    [provider]  simvastatin (ZOCOR) 40 MG tablet Take 40 mg by mouth at bedtime. 10/31/20   [provider]      Allergies    Nsaids  Review of Systems   Review of Systems  Genitourinary:  Positive for frequency and urgency.  All other systems reviewed and are negative.   Physical Exam Updated Vital Signs BP (!) 146/78   Pulse 66   Temp 98.6 F (37 C) (Oral)   Resp 17   SpO2 94%  Physical Exam Vitals and nursing note reviewed.   Gen: NAD Eyes: PERRL, EOMI HEENT: no oropharyngeal swelling Neck: trachea midline Resp: clear to auscultation bilaterally Card: RRR, no murmurs, rubs, or gallops Abd: nontender, nondistended, no CVA tenderness Extremities: no calf tenderness, no edema Vascular: 2+ radial pulses bilaterally, 2+ DP pulses bilaterally Neuro: No focal deficits alert and oriented x 2 Skin: no  rashes Psyc: acting appropriately   ED Results / Procedures / Treatments   Labs (all labs ordered are listed, but only abnormal results are displayed) Labs Reviewed  CBC WITH DIFFERENTIAL/PLATELET - Abnormal; Notable for the following components:      Result Value   MCV 100.5 (*)    All other components within normal limits  URINALYSIS, ROUTINE W REFLEX MICROSCOPIC - Abnormal; Notable for the following components:   Leukocytes,Ua TRACE (*)    All other components within normal limits  COMPREHENSIVE METABOLIC PANEL    EKG None  Radiology No results found.  Procedures Procedures    Medications Ordered in ED Medications  iohexol (OMNIPAQUE) 350 MG/ML injection 100 mL (has no administration in time range)  iohexol (OMNIPAQUE) 300 MG/ML solution 100 mL (100 mLs Intravenous Contrast Given 07/14/23 1602)    ED Course/ Medical Decision Making/ A&P                                 Medical Decision Making 82 year old female with past medical history of dementia and hypertension presenting to the emergency department today with apparent increased confusion and urinary symptoms at home.  I will obtain basic labs here as well as urinalysis for further evaluation for recurrent urinary tract infection.  The patient is calm and cooperative here.  I will reevaluate for ultimate disposition.  I do not think that any imaging is warranted this time.  Seems that it could be a behavioral disturbance due to her underlying dementia.  Patient's initial labs are unremarkable.  I did call discuss this with her daughter.  I think this very well may be due to her underlying dementia.  She does report that the patient has been complaining of some flank pain over the past couple of days.  CT scan of her abdomen is ordered as well as a CT head.  If this is negative I think the patient is stable for discharge.  She is signing on to oncoming provider at the time of signout.  Amount and/or Complexity of Data  Reviewed Labs: ordered. Radiology: ordered.  Risk Prescription drug management.           Final Clinical Impression(s) / ED Diagnoses Final diagnoses:  Dementia, unspecified dementia severity, unspecified dementia type, unspecified whether behavioral, psychotic, or mood disturbance or anxiety (HCC)    Rx / DC Orders ED Discharge Orders     None         Durwin Glaze, MD 07/14/23 1637
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
## 2023-07-19 ENCOUNTER — Telehealth: Payer: Self-pay | Admitting: Neurology

## 2023-07-19 NOTE — Telephone Encounter (Signed)
Pt's daughter on DPR [name] called and LVM requesting a call back from the RN to discuss the appt for tomorrow. Please advise.

## 2023-07-19 NOTE — Telephone Encounter (Signed)
Lvm for rc 1ST ATTEMPT

## 2023-07-20 ENCOUNTER — Telehealth: Payer: Self-pay

## 2023-07-20 ENCOUNTER — Ambulatory Visit: Payer: Medicare Other | Admitting: Neurology

## 2023-07-20 ENCOUNTER — Encounter: Payer: Self-pay | Admitting: Neurology

## 2023-07-20 VITALS — BP 122/70 | HR 69 | Ht 64.0 in | Wt 121.0 lb

## 2023-07-20 DIAGNOSIS — R269 Unspecified abnormalities of gait and mobility: Secondary | ICD-10-CM | POA: Diagnosis not present

## 2023-07-20 DIAGNOSIS — F03918 Unspecified dementia, unspecified severity, with other behavioral disturbance: Secondary | ICD-10-CM

## 2023-07-20 MED ORDER — ARIPIPRAZOLE 5 MG PO TABS: 5.0000 mg | ORAL_TABLET | Freq: Every day | ORAL | 11 refills | Status: DC

## 2023-07-20 MED ORDER — QUETIAPINE FUMARATE 25 MG PO TABS: 75.0000 mg | ORAL_TABLET | Freq: Every day | ORAL | 11 refills | Status: DC

## 2023-07-20 MED ORDER — LORAZEPAM 1 MG PO TABS: 1.0000 mg | ORAL_TABLET | Freq: Three times a day (TID) | ORAL | 5 refills | Status: DC | PRN

## 2023-07-20 MED ORDER — BREXPIPRAZOLE 0.25 MG PO TABS: ORAL_TABLET | ORAL | 6 refills | Status: DC

## 2023-07-20 NOTE — Telephone Encounter (Signed)
Prescription for Rexulti faxed to [facility] # [phone]
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
## 2023-07-20 NOTE — Telephone Encounter (Signed)
Returned call to pt daughter who stated during appt they would like to speak privately about pts mental status during appt to provider.

## 2023-08-06 ENCOUNTER — Other Ambulatory Visit: Payer: Self-pay | Admitting: Neurology

## 2023-08-11 ENCOUNTER — Other Ambulatory Visit: Payer: Self-pay | Admitting: Neurology

## 2023-08-18 ENCOUNTER — Telehealth: Payer: Self-pay | Admitting: Neurology

## 2023-08-18 MED ORDER — QUETIAPINE FUMARATE 25 MG PO TABS: ORAL_TABLET | ORAL | 11 refills | Status: DC

## 2023-08-18 NOTE — Telephone Encounter (Signed)
Please let patient's daughter know, since Abilify does not help her, it is okay to stop  I have given her a new prescription with higher dose of Seroquel 25 mg  1 in the morning, up to 3 tablets every night

## 2023-08-18 NOTE — Telephone Encounter (Signed)
Pt's daughter, Beverely Pace (on [place]) have a question about ARIPiprazole (ABILIFY) 5 MG tablet. What the reason for taking her off of Abilify and can she be prescribed Adderall. Would like a call back.

## 2023-08-18 NOTE — Telephone Encounter (Signed)
Returned call to patients daughter [name], she says her moms behavior is worse since starting back the Abilify. She had previously taken it but was taken off because of behavior concerns. She also sates that per discussion with dr. Terrace Arabia at last visit she is going to start giving 1 Seroquel in the morning and 2 at night. She also takes ativan in the morning and evening. Daughter also wants to know if adderall is an option for her mom. Daughter discussed hospice services I reviewed chart and advised to reach out to PCP. Advised I would send information to Dr. Terrace Arabia and follow back up with recommendations. Daughter appreciative of call

## 2023-08-18 NOTE — Telephone Encounter (Signed)
Call to patients daughter Corrie Dandy, no answer. Left message to return call to review Seroquel 25 mg medication. Patient can stop Abilify and take 1 35 mg Seroquel in the morning and (75 mg) Seroquel at bedtime.

## 2023-08-30 NOTE — Telephone Encounter (Signed)
Pt's daughter called and LVM stating that they are in the middle of getting her in to a nursing facility due to just walking out at night and she would like to discuss the pt's medications with RN. Please advise.

## 2023-08-30 NOTE — Telephone Encounter (Signed)
Called and relayed message to daughter about medications who was agreeable and grateful for the call.

## 2023-08-31 NOTE — Telephone Encounter (Signed)
Documentation on 11/20 typo should says 35 mg and should day 25 mg in regards to seroquel.    Spoke with daughter and clarified medication. Daughter reports she never checked my voicemail from 11/20. Advised daughter jolene that patient Seroquel script is 25 mg in the morning and 75 mg in the evening. Also reviewed lorazepam dosing. Daughter states that she has had sudden worsening of behavior and began wandering outside the home. Inquired on UTI symptoms and daughter is unsure but does verbalized that she will either take to the ER or urgent care to rule out.

## 2023-09-02 ENCOUNTER — Telehealth: Payer: Self-pay

## 2023-09-02 DIAGNOSIS — F03918 Unspecified dementia, unspecified severity, with other behavioral disturbance: Secondary | ICD-10-CM
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
## 2023-09-02 NOTE — Telephone Encounter (Signed)
I called and spoke to pharmacy and they stated for a weeks supply it would be $17 but we would have to send a script giving an over ride for the prescription because technically its too early to fill at this time. [facility] pharmacist, says that the patient asks for early refills often, but the provider has to send okay to fill.

## 2023-09-03 NOTE — Telephone Encounter (Signed)
08/17/2023 07/20/2023  1 Lorazepam 1 Mg Tablet 90.00 30 Roney Jaffe [id number] Nor (3235) 1/5 3.00 LME Medicare [place]  07/20/2023 07/20/2023  1 Lorazepam 1 Mg Tablet 90.00 30 Roney Jaffe [id number] Nor (3235) 0/5 3.00 LME Medicare [place]  06/28/2023 06/28/2023  1 Lorazepam 2 Mg Tablet 30.00 30 Roney Jaffe [id number] Nor (3235) 0/3 2.00 LME Medicare [place]  06/24/2023 04/28/2023  1 Lorazepam 2 Mg Tablet 15.00 30 Roney Jaffe [id number] Nor (3235) 1/3 1.00 LME Private Pay [place]  05/26/2023 04/12/2023  1 Lorazepam 2 Mg Tablet 47.00 31 Kr Kap [id number] Nor (3235) 1/1 3.03 LME Medicare [place]  04/29/2023 04/28/2023  1 Lorazepam 2 Mg Tablet 15.00 30 Roney Jaffe [id number] Nor (3235) 0/3 1.00 LME Private Pay [place]  04/12/2023 04/12/2023  1 Lorazepam 2 Mg Tablet 47.00 31 Kr Kap [id number] Nor (3235) 0/1 3.03 LME Medicare [place]  03/11/2023 02/10/2023  1 Lorazepam 2 Mg Tablet 47.00 31 Kr Kap [id number] Nor (3235) 1/1 3.03 LME Medicare [place]  02/26/2023 02/26/2023  1 Tramadol Hcl 50 Mg Tablet 15.00 5 Da Bro [id number] Nor (3235) 0/0 30.00 MME Medicare [place]  02/10/2023 02/10/2023  1 Lorazepam 2 Mg Tablet 47.00 31 Kr Kap [id number] Nor (3235) 0/1 3.03 LME Medicare [place]  01/12/2023 12/09/2022  1 Lorazepam 2 Mg Tablet 47.00 30 Kr Kap [id number] Nor (3235) 1/1 3.13 LME Medicare [place]  12/09/2022 12/09/2022  1 Lorazepam 2 Mg Tablet 47.00 30 Kr Kap [id number] Nor (3235) 0/1 3.13 LME Medicare [place]  07/14/2022 07/13/2022  1 Lorazepam 2 Mg Tablet 47.00 30 Kr Kap [id number] Nor (3235) 0/1 3.13 LME Medicare [place]  07/05/2022 06/26/2022  1 Lorazepam 2 Mg Tablet 15.00 30 Ca Doh [id number] Nor (3235) 0/0 1.00 LME Medicare [place]  06/08/2022 05/11/2022  1 Lorazepam 2 Mg Tablet 47.00 30 Kr Kap [id number] Nor (3235) 1/1 3.13 LME Medicare [place]   Registry reviewed, she is getting frequent lorazepam refill, last refill November 19 90 tablets, before that was about a month ago also 90 tablets  I do not feel comfortable to give her more prescription of lorazepam, she is supposed to take it as needed,  Other option will be higher dose of  Seroquel, or add on Remeron at nighttime

## 2023-09-06 ENCOUNTER — Other Ambulatory Visit: Payer: Self-pay

## 2023-09-06 MED ORDER — REXULTI 0.5 MG PO TABS: 0.5000 mg | ORAL_TABLET | Freq: Every day | ORAL | 5 refills | Status: DC

## 2023-09-06 NOTE — Addendum Note (Signed)
Addended by: Levert Feinstein on: 09/06/2023 09:32 AM   Modules accepted: Orders

## 2023-09-06 NOTE — Telephone Encounter (Signed)
Call to daughter, jolene sims. She states patient is only getting lorazepam 1 tablet in the morning and 0.5 tablet at bedtime. Patient has been more compliant with seroquel and jolene states the 25 mg seroquel in the morning and 75 mg at bedtime has been effective in symptom management. She states they do not need a refill on the lorazepam at this time. When it is needed, the most Dr. Terrace Arabia is willing to give is 1 tablet twice daily as needed for symptom management. [name] does not want to add the rexulti yet and patient has improved since compliant with other medications. She was appreciative for follow up.

## 2023-09-06 NOTE — Telephone Encounter (Signed)
Meds ordered this encounter  Medications   Brexpiprazole (REXULTI) 0.5 MG TABS    Sig: Take 1 tablet (0.5 mg total) by mouth daily.    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 Endocrine metabolic: Hyperglycemia (9% to 20% ), Serum triglycerides above reference range (Short-term use, 5% to 15%; long-term use, 8.5% to 18% ), Weight gain (Short-term use, 2% to 11%; long-term use, 20% to 30% ) Neurologic: Akathisia (1% to 14% ), Extrapyramidal movements, Excluding akathisia (3% to 6% ), Headache (4% to 9% )

## 2023-09-06 NOTE — Addendum Note (Signed)
Addended by: Lenn Cal on: 09/06/2023 10:04 AM   Modules accepted: Orders

## 2023-10-04 NOTE — Telephone Encounter (Signed)
 Patient's daughter, Beverely Pace, left a voicemail this morning at 11:24 asking for a call to discuss patient's Seroquel.  Please call [name] at [phone].

## 2023-10-05 NOTE — Telephone Encounter (Signed)
 Call to daughter jolene, no answer. Left message to return call

## 2023-10-05 NOTE — Telephone Encounter (Signed)
 Call to [name], patients daughter. She states that in the mornings before her seroquel  25mg  and lorazepam  1 mg are given, her mother is very emotional with crying spells and then she returns to baseline and has another crying spell early afternoon. Daughter wants to know if this is typical of disease or could be medications related. Reviewed disease process and advised would send to Dr. Onita for review.

## 2023-10-05 NOTE — Telephone Encounter (Signed)
 Patient with her condition dementia with agitations, tends to have fluctuation,   Based on previous conversation, we decided on Seroquel  up to 25 mg 3 tablets every night for sleep, Xanax as needed  Abilify  5 mg as every morning was tried, did not help her,  stopped taking it,   Options are: If her emotional outburst are transient, she can bounce back to her baseline, it is okay to keep current medications Add on more medications, chart reviewed, she is already on Zoloft  200 mg daily, in the process of prior authorization for Rexulti 

## 2023-10-27 ENCOUNTER — Telehealth: Payer: Self-pay | Admitting: Neurology

## 2023-10-27 DIAGNOSIS — M21372 Foot drop, left foot: Secondary | ICD-10-CM

## 2023-10-27 DIAGNOSIS — F03918 Unspecified dementia, unspecified severity, with other behavioral disturbance: Secondary | ICD-10-CM

## 2023-10-27 DIAGNOSIS — R269 Unspecified abnormalities of gait and mobility: Secondary | ICD-10-CM

## 2023-10-27 MED ORDER — BREXPIPRAZOLE 1 MG PO TABS: ORAL_TABLET | ORAL | 6 refills | Status: DC

## 2023-10-27 MED ORDER — QUETIAPINE FUMARATE 25 MG PO TABS: ORAL_TABLET | ORAL | 2 refills | Status: DC

## 2023-10-27 NOTE — Addendum Note (Signed)
Addended by: Lenn Cal on: 10/27/2023 04:23 PM   Modules accepted: Orders

## 2023-10-27 NOTE — Telephone Encounter (Signed)
Pt's daughter reports pt has gotten a little combative, she'd like to discuss pt being able to get meds at a different time, please call.

## 2023-10-27 NOTE — Telephone Encounter (Signed)
Call to daughter, reviewed increase in seroquel and prescription for rexulti sent in. Advised not stop seroquel or start rexulti until we give clear taper instructions. Rexulti will likely require a PA. Updated seroquel sent to pharmacy.

## 2023-10-27 NOTE — Telephone Encounter (Signed)
Meds ordered this encounter  Medications   brexpiprazole (REXULTI) 1 MG TABS tablet    Sig: 1/ 2 tab at bedtime xone week Then 1 at bedtime xone week Then 2 at bedtime    Dispense:  60 tablet    Refill:  6     We will try above new medications, In the meantime, it is OK to give patient seroqueal 25mg  1/1/3.

## 2023-10-27 NOTE — Addendum Note (Signed)
Addended by: Levert Feinstein on: 10/27/2023 03:16 PM   Modules accepted: Orders

## 2023-10-27 NOTE — Telephone Encounter (Signed)
Call to daughter, she states her mom has been combative for the last week. She takes a 25 mg seroquel in the morning and 3 at bedtime. She has been having to give 1 of the 3 a little earlier to due to the combativeness and the 2 tablets at bedtime aren't as effective. She is asking about adding an additional 25 mg dose to help keep patient calm. Advise I would send to Dr. Terrace Arabia To review

## 2023-11-23 ENCOUNTER — Other Ambulatory Visit: Payer: Self-pay | Admitting: Neurology

## 2023-11-24 ENCOUNTER — Other Ambulatory Visit: Payer: Self-pay | Admitting: Neurology

## 2023-11-24 ENCOUNTER — Other Ambulatory Visit: Payer: Self-pay

## 2023-11-24 NOTE — Telephone Encounter (Signed)
 Pt Last Seen 07/20/2023 Upcoming Appointment 02/09/2024  Seroquel Last Filled 10/27/2023 (30 day Supply) Pt is requesting 90 day Supply Is it okay to [facility] Request?

## 2023-12-18 ENCOUNTER — Encounter (HOSPITAL_BASED_OUTPATIENT_CLINIC_OR_DEPARTMENT_OTHER): Payer: Self-pay | Admitting: Emergency Medicine

## 2023-12-18 ENCOUNTER — Emergency Department (HOSPITAL_BASED_OUTPATIENT_CLINIC_OR_DEPARTMENT_OTHER)
Admission: EM | Admit: 2023-12-18 | Discharge: 2023-12-18 | Disposition: A | Discharge status: Home / Self Care | Source: Home / Self Care | Attending: Emergency Medicine | Admitting: Emergency Medicine

## 2023-12-18 ENCOUNTER — Other Ambulatory Visit: Payer: Self-pay

## 2023-12-18 ENCOUNTER — Emergency Department (HOSPITAL_BASED_OUTPATIENT_CLINIC_OR_DEPARTMENT_OTHER): Admitting: Radiology

## 2023-12-18 ENCOUNTER — Emergency Department (HOSPITAL_BASED_OUTPATIENT_CLINIC_OR_DEPARTMENT_OTHER)

## 2023-12-18 DIAGNOSIS — I10 Essential (primary) hypertension: Secondary | ICD-10-CM | POA: Insufficient documentation

## 2023-12-18 DIAGNOSIS — W19XXXA Unspecified fall, initial encounter: Secondary | ICD-10-CM

## 2023-12-18 DIAGNOSIS — Z7982 Long term (current) use of aspirin: Secondary | ICD-10-CM | POA: Insufficient documentation

## 2023-12-18 DIAGNOSIS — W1839XA Other fall on same level, initial encounter: Secondary | ICD-10-CM | POA: Insufficient documentation

## 2023-12-18 DIAGNOSIS — N390 Urinary tract infection, site not specified: Secondary | ICD-10-CM | POA: Diagnosis not present

## 2023-12-18 DIAGNOSIS — N3000 Acute cystitis without hematuria: Secondary | ICD-10-CM | POA: Insufficient documentation

## 2023-12-18 DIAGNOSIS — R410 Disorientation, unspecified: Secondary | ICD-10-CM | POA: Diagnosis not present

## 2023-12-18 DIAGNOSIS — I2510 Atherosclerotic heart disease of native coronary artery without angina pectoris: Secondary | ICD-10-CM | POA: Insufficient documentation

## 2023-12-18 DIAGNOSIS — R42 Dizziness and giddiness: Secondary | ICD-10-CM | POA: Insufficient documentation

## 2023-12-18 DIAGNOSIS — F0390 Unspecified dementia without behavioral disturbance: Secondary | ICD-10-CM | POA: Insufficient documentation

## 2023-12-18 LAB — CBC WITH DIFFERENTIAL/PLATELET
Abs Immature Granulocytes: 0.04 10*3/uL (ref 0.00–0.07)
Basophils Absolute: 0 10*3/uL (ref 0.0–0.1)
Basophils Relative: 0 %
Eosinophils Absolute: 0.2 10*3/uL (ref 0.0–0.5)
Eosinophils Relative: 3 %
HCT: 31.8 % — ABNORMAL LOW (ref 36.0–46.0)
Hemoglobin: 10.8 g/dL — ABNORMAL LOW (ref 12.0–15.0)
Immature Granulocytes: 1 %
Lymphocytes Relative: 29 %
Lymphs Abs: 1.8 10*3/uL (ref 0.7–4.0)
MCH: 32.3 pg (ref 26.0–34.0)
MCHC: 34 g/dL (ref 30.0–36.0)
MCV: 95.2 fL (ref 80.0–100.0)
Monocytes Absolute: 0.4 10*3/uL (ref 0.1–1.0)
Monocytes Relative: 6 %
Neutro Abs: 3.9 10*3/uL (ref 1.7–7.7)
Neutrophils Relative %: 61 %
Platelets: 132 10*3/uL — ABNORMAL LOW (ref 150–400)
RBC: 3.34 MIL/uL — ABNORMAL LOW (ref 3.87–5.11)
RDW: 12.6 % (ref 11.5–15.5)
WBC: 6.3 10*3/uL (ref 4.0–10.5)
nRBC: 0 % (ref 0.0–0.2)

## 2023-12-18 LAB — COMPREHENSIVE METABOLIC PANEL
ALT: 7 U/L (ref 0–44)
AST: 18 U/L (ref 15–41)
Albumin: 3.8 g/dL (ref 3.5–5.0)
Alkaline Phosphatase: 97 U/L (ref 38–126)
Anion gap: 7 (ref 5–15)
BUN: 18 mg/dL (ref 8–23)
CO2: 25 mmol/L (ref 22–32)
Calcium: 8.6 mg/dL — ABNORMAL LOW (ref 8.9–10.3)
Chloride: 107 mmol/L (ref 98–111)
Creatinine, Ser: 0.8 mg/dL (ref 0.44–1.00)
GFR, Estimated: >60 mL/min (ref >60)
Glucose, Bld: 80 mg/dL (ref 70–99)
Potassium: 3.8 mmol/L (ref 3.5–5.1)
Sodium: 139 mmol/L (ref 135–145)
Total Bilirubin: 0.3 mg/dL (ref 0.0–1.2)
Total Protein: 6.1 g/dL — ABNORMAL LOW (ref 6.5–8.1)

## 2023-12-18 LAB — URINALYSIS, W/ REFLEX TO CULTURE (INFECTION SUSPECTED)
Bilirubin Urine: NEGATIVE
Glucose, UA: NEGATIVE mg/dL
Ketones, ur: NEGATIVE mg/dL
Nitrite: POSITIVE — AB
Specific Gravity, Urine: 1.025 (ref 1.005–1.030)
WBC, UA: >50 WBC/hpf (ref 0–5)
pH: 6 (ref 5.0–8.0)

## 2023-12-18 MED ORDER — CEPHALEXIN 250 MG PO CAPS
500.0000 mg | ORAL_CAPSULE | Freq: Once | ORAL | Status: AC
  Administered 2023-12-18: 500 mg via ORAL
  Filled 2023-12-18: qty 2

## 2023-12-18 MED ORDER — CEPHALEXIN 500 MG PO CAPS
500.0000 mg | ORAL_CAPSULE | Freq: Two times a day (BID) | ORAL | 0 refills | Status: DC
Start: 2023-12-18 — End: 2023-12-26

## 2023-12-18 NOTE — ED Provider Notes (Signed)
 [place] EMERGENCY DEPARTMENT AT [facility] Provider Note   CSN: [id number] Arrival date & time: 12/18/23  1629     History  Chief Complaint  Patient presents with   [name] is a 83 y.o. female.  Patient is a 83 year old female with a past medical history of dementia, hypertension, CAD, and left foot drop presenting to the emergency department with multiple falls.  Patient is here with her daughters who states that she eats fall and are nearly fallen multiple times today.  Her daughter reports that she was complaining of feeling dizzy like she might pass out prior to the falls.  The patient denies any complete loss of consciousness and denies hitting her head.  Her daughter also reports for about the last month she has been complaining of daily headaches.  Sometimes will happen before bed and sometimes will be there when she gets up in the morning and noise completely resolves with Tylenol.  Patient reports that she is having pain in her right hand from the fall today and denies any other pain or injuries.  She denies any vision changes, nausea, vomiting, numbness or weakness.  She denies any recent fevers or illnesses.  Was recently treated for UTI and denies having any current urinary symptoms.  The history is provided by the patient and a relative. History limited by: level 5 caveat for dementia.  Fall       Home Medications Prior to Admission medications   Medication Sig Start Date End Date Taking? Authorizing Provider  cephALEXin (KEFLEX) 500 MG capsule Take 1 capsule (500 mg total) by mouth 2 (two) times daily for 5 days. 12/18/23 12/23/23 Yes Elayne Snare K, DO  acetaminophen (TYLENOL) 500 MG tablet Take 500 mg by mouth every 6 (six) hours as needed for moderate pain.    [provider]  aspirin 81 MG tablet Take 81 mg by mouth daily.    [provider]  brexpiprazole (REXULTI) 0.25 MG TABS tablet One tab daily x one week, Then  2 tabs daily x 2nd week 3 tabs daily x 3rd week, then 4 tabs daily 07/20/23   Levert Feinstein, MD  Brexpiprazole (REXULTI) 0.5 MG TABS Take 1 tablet (0.5 mg total) by mouth daily. 09/06/23   Levert Feinstein, MD  brexpiprazole (REXULTI) 1 MG TABS tablet 1/ 2 tab at bedtime xone week Then 1 at bedtime xone week Then 2 at bedtime 10/27/23   Levert Feinstein, MD  Cholecalciferol (VITAMIN D) 125 MCG (5000 UT) CAPS Take 5,000 Units by mouth daily.    [provider]  doxazosin (CARDURA) 8 MG tablet Take 8 mg by mouth daily.    [provider]  ferrous sulfate 324 MG TBEC Take 324 mg by mouth.    [provider]  fluticasone (FLONASE) 50 MCG/ACT nasal spray Place 1 spray into both nostrils daily as needed for allergies or rhinitis.    [provider]  isosorbide mononitrate (IMDUR) 60 MG 24 hr tablet Take 60 mg by mouth daily.    [provider]  levothyroxine (SYNTHROID) 75 MCG tablet Take 75 mcg by mouth daily. 12/18/20   [provider]  lidocaine (LIDODERM) 5 % Place 1 patch onto the skin daily. Remove & Discard patch within 12 hours or as directed by MD 11/07/21   Couture, Cortni S, PA-C  LORazepam (ATIVAN) 1 MG tablet Take 1 mg by mouth 2 (two) times daily as needed for anxiety.  Levert Feinstein, MD  meclizine (ANTIVERT) 25 MG tablet Take 1 tablet (25 mg total) by mouth 3 (three) times daily as needed for dizziness. 06/11/22   Dione Booze, MD  memantine (NAMENDA) 10 MG tablet TAKE 1 TABLET BY MOUTH TWICE A DAY 08/09/23   Glean Salvo, NP  Multiple Vitamin (MULTIVITAMIN) tablet Take 1 tablet by mouth every morning.    [provider]  nitrofurantoin (MACRODANTIN) 100 MG capsule Take 100 mg by mouth daily. 05/12/22   [provider]  ondansetron (ZOFRAN-ODT) 8 MG disintegrating tablet Take 1 tablet (8 mg total) by mouth every 8 (eight) hours as needed for nausea or vomiting. 06/11/22   Dione Booze, MD  QUEtiapine (SEROQUEL) 25 MG tablet TAKE 1 TABLET  BY MOUTH EACH MORNING, 1 TABLET IN THE AFTERNOON , AND 3 TABLETS AT BEDTIME 11/25/23   Levert Feinstein, MD  sertraline (ZOLOFT) 100 MG tablet Take 200 mg by mouth daily.    [provider]  simvastatin (ZOCOR) 40 MG tablet Take 40 mg by mouth at bedtime. 10/31/20   [provider]      Allergies    Nsaids    Review of Systems   Review of Systems  Physical Exam Updated Vital Signs BP 94/73   Pulse (!) 59   Temp 98.1 F (36.7 C) (Oral)   Resp 19   SpO2 97%  Physical Exam Vitals and nursing note reviewed.  Constitutional:      General: She is not in acute distress.    Appearance: Normal appearance.  HENT:     Head: Normocephalic and atraumatic.     Nose: Nose normal.     Mouth/Throat:     Mouth: Mucous membranes are moist.     Pharynx: Oropharynx is clear.  Eyes:     Extraocular Movements: Extraocular movements intact.     Conjunctiva/sclera: Conjunctivae normal.     Pupils: Pupils are equal, round, and reactive to light.  Neck:     Comments: No midline neck tenderness Cardiovascular:     Rate and Rhythm: Normal rate and regular rhythm.     Heart sounds: Normal heart sounds.  Pulmonary:     Effort: Pulmonary effort is normal.     Breath sounds: Normal breath sounds.  Abdominal:     General: Abdomen is flat.     Palpations: Abdomen is soft.     Tenderness: There is no abdominal tenderness.  Musculoskeletal:        General: Normal range of motion.     Cervical back: Normal range of motion and neck supple.     Comments: No midline back tenderness No bony tenderness to bilateral UE or LE including R hand, no obvious deformity Pelvis stable, non-tender  Skin:    General: Skin is warm and dry.  Neurological:     General: No focal deficit present.     Mental Status: She is alert. Mental status is at baseline.     Cranial Nerves: No cranial nerve deficit.     Sensory: No sensory deficit.     Motor: No weakness.  Psychiatric:        Mood and Affect: Mood  normal.        Behavior: Behavior normal.     ED Results / Procedures / Treatments   Labs (all labs ordered are listed, but only abnormal results are displayed) Labs Reviewed  CBC WITH DIFFERENTIAL/PLATELET - Abnormal; Notable for the following components:      Result Value  RBC 3.34 (*)    Hemoglobin 10.8 (*)    HCT 31.8 (*)    Platelets 132 (*)    All other components within normal limits  COMPREHENSIVE METABOLIC PANEL - Abnormal; Notable for the following components:   Calcium 8.6 (*)    Total Protein 6.1 (*)    All other components within normal limits  URINALYSIS, W/ REFLEX TO CULTURE (INFECTION SUSPECTED) - Abnormal; Notable for the following components:   APPearance HAZY (*)    Hgb urine dipstick TRACE (*)    Protein, ur TRACE (*)    Nitrite POSITIVE (*)    Leukocytes,Ua LARGE (*)    Bacteria, UA FEW (*)    All other components within normal limits  URINE CULTURE    EKG EKG Interpretation Date/Time:  Saturday December 18 2023 17:45:43 EDT Ventricular Rate:  60 PR Interval:  170 QRS Duration:  91 QT Interval:  437 QTC Calculation: 437 R Axis:   -24  Text Interpretation: Sinus rhythm Borderline left axis deviation Low voltage, precordial leads Abnormal R-wave progression, early transition No significant change since last tracing Confirmed by Elayne Snare ([id number]) on 12/18/2023 5:54:01 PM  Radiology CT Head Wo Contrast Result Date: 12/18/2023 CLINICAL DATA:  Head trauma, minor (Age >= 65y) Headache, new onset (Age >= 51y); Neck trauma (Age >= 65y). Fall EXAM: CT HEAD WITHOUT CONTRAST CT CERVICAL SPINE WITHOUT CONTRAST TECHNIQUE: Multidetector CT imaging of the head and cervical spine was performed following the standard protocol without intravenous contrast. Multiplanar CT image reconstructions of the cervical spine were also generated. RADIATION DOSE REDUCTION: This exam was performed according to the departmental dose-optimization program which includes automated  exposure control, adjustment of the mA and/or kV according to patient size and/or use of iterative reconstruction technique. COMPARISON:  CT head and C-spine 07/08/2022, CT head 10 the 1624 FINDINGS: CT HEAD FINDINGS Brain: Patchy and confluent areas of decreased attenuation are noted throughout the deep and periventricular white matter of the cerebral hemispheres bilaterally, compatible with chronic microvascular ischemic disease. No evidence of large-territorial acute infarction. No parenchymal hemorrhage. No mass lesion. No extra-axial collection. No mass effect or midline shift. No hydrocephalus. Basilar cisterns are patent. Vascular: No hyperdense vessel. Skull: No acute fracture or focal lesion. Sinuses/Orbits: Paranasal sinuses and mastoid air cells are clear. Bilateral lens replacement. Otherwise the orbits are unremarkable. Other: None. CT CERVICAL SPINE FINDINGS Alignment: Normal. Skull base and vertebrae: Multilevel degenerative change of the spine most prominent at the C6-C7 level. No associated severe osseous neural foraminal or central canal stenosis. No acute fracture. No aggressive appearing focal osseous lesion or focal pathologic process. Soft tissues and spinal canal: No prevertebral fluid or swelling. No visible canal hematoma. Upper chest: Biapical pleural/pulmonary scarring. Other: Atherosclerotic plaque of the aortic arch and its main branches. IMPRESSION: 1. No acute intracranial abnormality. 2. No acute displaced fracture or traumatic listhesis of the cervical spine. 3.  Aortic Atherosclerosis (ICD10-I70.0). Electronically Signed   By: Tish Frederickson M.D.   On: 12/18/2023 18:15   CT Cervical Spine Wo Contrast Result Date: 12/18/2023 CLINICAL DATA:  Head trauma, minor (Age >= 65y) Headache, new onset (Age >= 51y); Neck trauma (Age >= 65y). Fall EXAM: CT HEAD WITHOUT CONTRAST CT CERVICAL SPINE WITHOUT CONTRAST TECHNIQUE: Multidetector CT imaging of the head and cervical spine was performed  following the standard protocol without intravenous contrast. Multiplanar CT image reconstructions of the cervical spine were also generated. RADIATION DOSE REDUCTION: This exam was performed according to the departmental  dose-optimization program which includes automated exposure control, adjustment of the mA and/or kV according to patient size and/or use of iterative reconstruction technique. COMPARISON:  CT head and C-spine 07/08/2022, CT head 10 the 1624 FINDINGS: CT HEAD FINDINGS Brain: Patchy and confluent areas of decreased attenuation are noted throughout the deep and periventricular white matter of the cerebral hemispheres bilaterally, compatible with chronic microvascular ischemic disease. No evidence of large-territorial acute infarction. No parenchymal hemorrhage. No mass lesion. No extra-axial collection. No mass effect or midline shift. No hydrocephalus. Basilar cisterns are patent. Vascular: No hyperdense vessel. Skull: No acute fracture or focal lesion. Sinuses/Orbits: Paranasal sinuses and mastoid air cells are clear. Bilateral lens replacement. Otherwise the orbits are unremarkable. Other: None. CT CERVICAL SPINE FINDINGS Alignment: Normal. Skull base and vertebrae: Multilevel degenerative change of the spine most prominent at the C6-C7 level. No associated severe osseous neural foraminal or central canal stenosis. No acute fracture. No aggressive appearing focal osseous lesion or focal pathologic process. Soft tissues and spinal canal: No prevertebral fluid or swelling. No visible canal hematoma. Upper chest: Biapical pleural/pulmonary scarring. Other: Atherosclerotic plaque of the aortic arch and its main branches. IMPRESSION: 1. No acute intracranial abnormality. 2. No acute displaced fracture or traumatic listhesis of the cervical spine. 3.  Aortic Atherosclerosis (ICD10-I70.0). Electronically Signed   By: Tish Frederickson M.D.   On: 12/18/2023 18:15   DG Hand Complete Right Result Date:  12/18/2023 CLINICAL DATA:  Chest pain, frequent falls EXAM: RIGHT HAND - COMPLETE 3+ VIEW COMPARISON:  None Available. FINDINGS: There is no evidence of fracture or dislocation. First digit carpometacarpal joint degenerative changes. Ulnocarpal joint degenerative changes. There is no evidence of severe arthropathy or other focal bone abnormality. Soft tissues are unremarkable. IMPRESSION: No acute displaced fracture or dislocation. Electronically Signed   By: Tish Frederickson M.D.   On: 12/18/2023 18:09   DG Chest 1 View Result Date: 12/18/2023 CLINICAL DATA:  Chest pain, frequent falls EXAM: CHEST  1 VIEW COMPARISON:  Chest x-ray 07/07/2022 FINDINGS: Neural stimulator leads overlie the thoracic spine. Large hiatal hernia. The heart and mediastinal contours are unchanged. Atherosclerotic plaque. No focal consolidation. Chronic coarsened interstitial markings with no overt pulmonary edema. No pleural effusion. No pneumothorax. No acute osseous abnormality. IMPRESSION: 1. No active disease. 2. Aortic Atherosclerosis (ICD10-I70.0) and Emphysema (ICD10-J43.9). 3. Large hiatal hernia. Electronically Signed   By: Tish Frederickson M.D.   On: 12/18/2023 18:08    Procedures Procedures    Medications Ordered in ED Medications  cephALEXin (KEFLEX) capsule 500 mg (has no administration in time range)    ED Course/ Medical Decision Making/ A&P Clinical Course as of 12/18/23 1908  Sat Dec 18, 2023  1804 UA concerning for UTI with few bacteria, large leuks and nitrites. Will need to clarify with family which antibiotics she was recently taking. [VK]  1823 No acute traumatic injury on CT and XR imaging. CBC pending.  [VK]  1833 Mild anemia and thrombocytopenia from baseline.  [VK]    Clinical Course User Index [VK] Rexford Maus, DO                                 Medical Decision Making This patient presents to the ED with chief complaint(s) of frequent falls, dizziness with pertinent past medical  history of dementia, CAD, HTN which further complicates the presenting complaint. The complaint involves an extensive differential diagnosis and also carries with  it a high risk of complications and morbidity.    The differential diagnosis includes ICH, mass effect, cervical spine fracture, hand fracture, arrhythmia, anemia, dehydration, electrolyte abnormality, infection, polypharmacy  Additional history obtained: Additional history obtained from family Records reviewed Primary Care Documents and outpatient neurology records  ED Course and Reassessment: On patient's arrival she is hemodynamically stable in no acute distress without focal neurologic deficits.  Patient will have workup including EKG, labs, head and neck CT as well as chest and right hand x-ray to evaluate for traumatic injury and cause of her falls.  She declined any pain control at this time and will be closely reassessed.  Independent labs interpretation:  The following labs were independently interpreted: UA positive for UTI, mild anemia  Independent visualization of imaging: - I independently visualized the following imaging with scope of interpretation limited to determining acute life threatening conditions related to emergency care: CTH/C-spine, CXR, R hand XR, which revealed no acute traumatic injury  Consultation: - Consulted or discussed management/test interpretation w/ external professional: N/A  Consideration for admission or further workup: Patient has no emergent conditions requiring admission or further work-up at this time and is stable for discharge home with primary care follow-up  Social Determinants of health: N/A    Amount and/or Complexity of Data Reviewed Labs: ordered. Radiology: ordered.  Risk Prescription drug management.          Final Clinical Impression(s) / ED Diagnoses Final diagnoses:  Acute cystitis without hematuria  Dizziness  Fall, initial encounter    Rx / DC  Orders ED Discharge Orders          Ordered    cephALEXin (KEFLEX) 500 MG capsule  2 times daily        12/18/23 1906              Rexford Maus, [place] 12/18/23 1908

## 2023-12-18 NOTE — ED Triage Notes (Signed)
 Pt from home via [facility] with reports of multiple falls recently and weakness. Pt c/o left 1st finger pain. Pt denies hitting head. Pt not on blood thinners. Pt ambulatory per EMS.

## 2023-12-18 NOTE — Discharge Instructions (Signed)
 You were seen in the emergency department for your dizziness and falls.  You are in a normal heart rhythm here and your workup showed that you likely still have a urinary tract infection and otherwise did not show any other signs of injury or other complication.  I have given you another course of antibiotics and you should complete this as prescribed.  You should follow-up with your primary doctor in the next few days to have your symptoms rechecked.  You should return to the emergency department if you were having worsening dizziness and pass out, you have fevers despite the antibiotics, develop repetitive vomiting and cannot tolerate the antibiotics, have severe abdominal or back pain or any other new or concerning symptoms.

## 2023-12-20 LAB — URINE CULTURE: Culture: >=100000 — AB

## 2023-12-21 ENCOUNTER — Telehealth: Payer: Self-pay | Admitting: Neurology

## 2023-12-21 ENCOUNTER — Inpatient Hospital Stay (HOSPITAL_COMMUNITY)
Admission: EM | Admit: 2023-12-21 | Discharge: 2023-12-26 | DRG: 690 | Disposition: A | Discharge status: Home Health | Attending: Internal Medicine | Admitting: Internal Medicine

## 2023-12-21 ENCOUNTER — Encounter (HOSPITAL_COMMUNITY): Payer: Self-pay | Admitting: *Deleted

## 2023-12-21 ENCOUNTER — Emergency Department (HOSPITAL_COMMUNITY)

## 2023-12-21 ENCOUNTER — Other Ambulatory Visit: Payer: Self-pay

## 2023-12-21 ENCOUNTER — Telehealth (HOSPITAL_BASED_OUTPATIENT_CLINIC_OR_DEPARTMENT_OTHER): Payer: Self-pay

## 2023-12-21 DIAGNOSIS — D649 Anemia, unspecified: Secondary | ICD-10-CM | POA: Diagnosis present

## 2023-12-21 DIAGNOSIS — I129 Hypertensive chronic kidney disease with stage 1 through stage 4 chronic kidney disease, or unspecified chronic kidney disease: Secondary | ICD-10-CM | POA: Diagnosis present

## 2023-12-21 DIAGNOSIS — R4182 Altered mental status, unspecified: Principal | ICD-10-CM

## 2023-12-21 DIAGNOSIS — D6959 Other secondary thrombocytopenia: Secondary | ICD-10-CM | POA: Diagnosis present

## 2023-12-21 DIAGNOSIS — Z8249 Family history of ischemic heart disease and other diseases of the circulatory system: Secondary | ICD-10-CM | POA: Diagnosis not present

## 2023-12-21 DIAGNOSIS — E785 Hyperlipidemia, unspecified: Secondary | ICD-10-CM | POA: Diagnosis present

## 2023-12-21 DIAGNOSIS — Z87442 Personal history of urinary calculi: Secondary | ICD-10-CM | POA: Diagnosis not present

## 2023-12-21 DIAGNOSIS — B9620 Unspecified Escherichia coli [E. coli] as the cause of diseases classified elsewhere: Secondary | ICD-10-CM | POA: Diagnosis present

## 2023-12-21 DIAGNOSIS — Z7982 Long term (current) use of aspirin: Secondary | ICD-10-CM | POA: Diagnosis not present

## 2023-12-21 DIAGNOSIS — N390 Urinary tract infection, site not specified: Secondary | ICD-10-CM | POA: Diagnosis present

## 2023-12-21 DIAGNOSIS — F03911 Unspecified dementia, unspecified severity, with agitation: Secondary | ICD-10-CM | POA: Diagnosis present

## 2023-12-21 DIAGNOSIS — R531 Weakness: Secondary | ICD-10-CM | POA: Diagnosis present

## 2023-12-21 DIAGNOSIS — K449 Diaphragmatic hernia without obstruction or gangrene: Secondary | ICD-10-CM | POA: Diagnosis present

## 2023-12-21 DIAGNOSIS — R410 Disorientation, unspecified: Secondary | ICD-10-CM | POA: Diagnosis present

## 2023-12-21 DIAGNOSIS — N3000 Acute cystitis without hematuria: Secondary | ICD-10-CM | POA: Diagnosis not present

## 2023-12-21 DIAGNOSIS — Z8673 Personal history of transient ischemic attack (TIA), and cerebral infarction without residual deficits: Secondary | ICD-10-CM | POA: Diagnosis not present

## 2023-12-21 DIAGNOSIS — N189 Chronic kidney disease, unspecified: Secondary | ICD-10-CM | POA: Diagnosis present

## 2023-12-21 DIAGNOSIS — R519 Headache, unspecified: Secondary | ICD-10-CM | POA: Diagnosis present

## 2023-12-21 DIAGNOSIS — F05 Delirium due to known physiological condition: Secondary | ICD-10-CM | POA: Diagnosis present

## 2023-12-21 DIAGNOSIS — F03918 Unspecified dementia, unspecified severity, with other behavioral disturbance: Secondary | ICD-10-CM | POA: Diagnosis present

## 2023-12-21 DIAGNOSIS — Z79890 Hormone replacement therapy (postmenopausal): Secondary | ICD-10-CM | POA: Diagnosis not present

## 2023-12-21 DIAGNOSIS — Z886 Allergy status to analgesic agent status: Secondary | ICD-10-CM

## 2023-12-21 DIAGNOSIS — Z79899 Other long term (current) drug therapy: Secondary | ICD-10-CM

## 2023-12-21 DIAGNOSIS — I10 Essential (primary) hypertension: Secondary | ICD-10-CM | POA: Diagnosis not present

## 2023-12-21 DIAGNOSIS — Z87440 Personal history of urinary (tract) infections: Secondary | ICD-10-CM

## 2023-12-21 DIAGNOSIS — R296 Repeated falls: Secondary | ICD-10-CM | POA: Diagnosis present

## 2023-12-21 DIAGNOSIS — I2510 Atherosclerotic heart disease of native coronary artery without angina pectoris: Secondary | ICD-10-CM | POA: Diagnosis present

## 2023-12-21 DIAGNOSIS — Z96642 Presence of left artificial hip joint: Secondary | ICD-10-CM | POA: Diagnosis present

## 2023-12-21 LAB — URINALYSIS, W/ REFLEX TO CULTURE (INFECTION SUSPECTED)
Bilirubin Urine: NEGATIVE
Glucose, UA: NEGATIVE mg/dL
Hgb urine dipstick: NEGATIVE
Ketones, ur: 5 mg/dL — AB
Nitrite: NEGATIVE
Protein, ur: NEGATIVE mg/dL
Specific Gravity, Urine: 1.019 (ref 1.005–1.030)
pH: 6 (ref 5.0–8.0)

## 2023-12-21 LAB — COMPREHENSIVE METABOLIC PANEL
ALT: 12 U/L (ref 0–44)
AST: 24 U/L (ref 15–41)
Albumin: 3.8 g/dL (ref 3.5–5.0)
Alkaline Phosphatase: 95 U/L (ref 38–126)
Anion gap: 10 (ref 5–15)
BUN: 16 mg/dL (ref 8–23)
CO2: 24 mmol/L (ref 22–32)
Calcium: 9.1 mg/dL (ref 8.9–10.3)
Chloride: 104 mmol/L (ref 98–111)
Creatinine, Ser: 0.85 mg/dL (ref 0.44–1.00)
GFR, Estimated: >60 mL/min (ref >60)
Glucose, Bld: 92 mg/dL (ref 70–99)
Potassium: 3.6 mmol/L (ref 3.5–5.1)
Sodium: 138 mmol/L (ref 135–145)
Total Bilirubin: 0.7 mg/dL (ref 0.0–1.2)
Total Protein: 6.3 g/dL — ABNORMAL LOW (ref 6.5–8.1)

## 2023-12-21 LAB — CBC WITH DIFFERENTIAL/PLATELET
Abs Immature Granulocytes: 0.01 10*3/uL (ref 0.00–0.07)
Basophils Absolute: 0 10*3/uL (ref 0.0–0.1)
Basophils Relative: 0 %
Eosinophils Absolute: 0.1 10*3/uL (ref 0.0–0.5)
Eosinophils Relative: 3 %
HCT: 34.9 % — ABNORMAL LOW (ref 36.0–46.0)
Hemoglobin: 11.8 g/dL — ABNORMAL LOW (ref 12.0–15.0)
Immature Granulocytes: 0 %
Lymphocytes Relative: 25 %
Lymphs Abs: 1.2 10*3/uL (ref 0.7–4.0)
MCH: 32.4 pg (ref 26.0–34.0)
MCHC: 33.8 g/dL (ref 30.0–36.0)
MCV: 95.9 fL (ref 80.0–100.0)
Monocytes Absolute: 0.3 10*3/uL (ref 0.1–1.0)
Monocytes Relative: 6 %
Neutro Abs: 3.1 10*3/uL (ref 1.7–7.7)
Neutrophils Relative %: 66 %
Platelets: 173 10*3/uL (ref 150–400)
RBC: 3.64 MIL/uL — ABNORMAL LOW (ref 3.87–5.11)
RDW: 12.3 % (ref 11.5–15.5)
WBC: 4.7 10*3/uL (ref 4.0–10.5)
nRBC: 0 % (ref 0.0–0.2)

## 2023-12-21 LAB — RESP PANEL BY RT-PCR (RSV, FLU A&B, COVID)  RVPGX2
Influenza A by PCR: NEGATIVE
Influenza B by PCR: NEGATIVE
Resp Syncytial Virus by PCR: NEGATIVE
SARS Coronavirus 2 by RT PCR: NEGATIVE

## 2023-12-21 MED ORDER — ACETAMINOPHEN 500 MG PO TABS
500.0000 mg | ORAL_TABLET | Freq: Four times a day (QID) | ORAL | Status: DC | PRN
  Administered 2023-12-22 – 2023-12-25 (×2): 500 mg via ORAL
  Filled 2023-12-21 (×2): qty 1

## 2023-12-21 MED ORDER — ONDANSETRON HCL 4 MG/2ML IJ SOLN: 4.0000 mg | Freq: Four times a day (QID) | INTRAMUSCULAR | Status: DC | PRN

## 2023-12-21 MED ORDER — IOHEXOL 350 MG/ML SOLN
75.0000 mL | Freq: Once | INTRAVENOUS | Status: AC | PRN
  Administered 2023-12-21: 75 mL via INTRAVENOUS

## 2023-12-21 MED ORDER — SODIUM CHLORIDE 0.9 % IV SOLN
1.0000 g | INTRAVENOUS | Status: DC
  Administered 2023-12-22 – 2023-12-23 (×2): 1 g via INTRAVENOUS
  Filled 2023-12-21: qty 10

## 2023-12-21 MED ORDER — BISACODYL 5 MG PO TBEC: 5.0000 mg | DELAYED_RELEASE_TABLET | Freq: Every day | ORAL | Status: DC | PRN

## 2023-12-21 MED ORDER — SODIUM CHLORIDE 0.9 % IV SOLN
1.0000 g | Freq: Once | INTRAVENOUS | Status: AC
  Administered 2023-12-21: 1 g via INTRAVENOUS
  Filled 2023-12-21: qty 10

## 2023-12-21 MED ORDER — ACETAMINOPHEN 650 MG RE SUPP: 650.0000 mg | Freq: Four times a day (QID) | RECTAL | Status: DC | PRN

## 2023-12-21 MED ORDER — HEPARIN SODIUM (PORCINE) 5000 UNIT/ML IJ SOLN
5000.0000 [IU] | Freq: Three times a day (TID) | INTRAMUSCULAR | Status: DC
  Administered 2023-12-22 – 2023-12-26 (×14): 5000 [IU] via SUBCUTANEOUS
  Filled 2023-12-21 (×14): qty 1

## 2023-12-21 MED ORDER — ONDANSETRON HCL 4 MG PO TABS
4.0000 mg | ORAL_TABLET | Freq: Four times a day (QID) | ORAL | Status: DC | PRN
  Administered 2023-12-25 (×2): 4 mg via ORAL
  Filled 2023-12-21 (×2): qty 1

## 2023-12-21 NOTE — Telephone Encounter (Signed)
 Post ED Visit - Positive Culture Follow-up  Culture report reviewed by antimicrobial stewardship pharmacist: [facility] []  Enzo Bi, Pharm.D. []  Celedonio Miyamoto, Pharm.D., BCPS AQ-ID []  Garvin Fila, Pharm.D., BCPS []  Georgina Pillion, [address].D., BCPS []  [place], [address].D., BCPS, AAHIVP []  Estella Husk, Pharm.D., BCPS, AAHIVP []  Lysle Pearl, PharmD, BCPS []  Phillips Climes, PharmD, BCPS []  Agapito Games, PharmD, BCPS []  Verlan Friends, PharmD []  Mervyn Gay, PharmD, BCPS []  Vinnie Level, PharmD X   Wilmer Floor, PharmD  [facility] []  Len Childs, PharmD []  Greer Pickerel, PharmD []  Adalberto Cole, PharmD []  Perlie Gold, Rph []  Lonell Face) Jean Rosenthal, PharmD []  Earl Many, PharmD []  Junita Push, PharmD []  Dorna Leitz, PharmD []  Terrilee Files, PharmD []  Lynann Beaver, PharmD []  Keturah Barre, PharmD []  Loralee Pacas, PharmD []  Bernadene Person, PharmD   Positive urine culture Treated with Cephalexin, organism sensitive to the same and no further patient follow-up is required at this time. Chart reviewed by Dr Anders Simmonds, DO  "No Changes"  Arvid Right 12/21/2023, 8:16 AM

## 2023-12-21 NOTE — ED Triage Notes (Signed)
 BIB [facility] from home for UTI, seen recently at [facility] but left AMA, h/o dementia, family reports agitation and confusion worse than usual, VSS. BS 109. Afebrile. H/o frequent recurrent UTI. At baseline cognitively. Alert, NAD, calm, tearful, anxious, interactive.

## 2023-12-21 NOTE — Telephone Encounter (Signed)
 Daughter stated pt has had uti and been in and out of hospital but is afraid that she has gotten worse and possibly septic. I advised to go to hospital.

## 2023-12-21 NOTE — ED Provider Notes (Signed)
 [facility] AT [facility] Provider Note   CSN: [id number] Arrival date & time: 12/21/23  1610     History Chief Complaint  Patient presents with   Dysuria    [name] a 83 y.o. female.  Patient with past history significant for [name], [name], [name], [name], [name] at bedside and reports that she has been endorsing UTI type symptoms with increased urinary frequency and urgency as well as some confusion and agitation for about 6 weeks.  Patient was seen at [facility] a few days ago and started on Keflex for suspected UTI.  Patient left AMA.  Patient was previously seen by urology but [name] not followed up with them in about 1 year.  Patient reportedly at cognitive baseline with no altered mental status at this time.   Dysuria      Home Medications Prior to Admission medications   Medication Sig Start Date End Date Taking? Authorizing Provider  acetaminophen (TYLENOL) 500 MG tablet Take 500 mg by mouth every 6 (six) hours as needed for moderate pain.   Yes [provider]  aspirin 81 MG tablet Take 81 mg by mouth in the morning.   Yes [provider]  calcium carbonate (TUMS - DOSED IN MG ELEMENTAL CALCIUM) 500 MG chewable tablet Chew 1 tablet by mouth as needed for indigestion or heartburn.   Yes [provider]  cephALEXin (KEFLEX) 500 MG capsule Take 1 capsule (500 mg total) by mouth 2 (two) times daily for 5 days. 12/18/23 12/23/23 Yes Elayne Snare K, DO  Cholecalciferol (VITAMIN D) 125 MCG (5000 UT) CAPS Take 5,000 Units by mouth daily.   Yes [provider]  doxazosin (CARDURA) 8 MG tablet Take 8 mg by mouth daily.   Yes [provider]  ferrous sulfate 324 MG TBEC Take 324 mg by mouth in the morning.   Yes [provider]  fluticasone (FLONASE) 50 MCG/ACT nasal spray Place 1 spray into both nostrils daily as  needed for allergies or rhinitis.   Yes [provider]  gabapentin (NEURONTIN) 100 MG capsule Take 100 mg by mouth 2 (two) times daily.   Yes [provider]  isosorbide mononitrate (IMDUR) 60 MG 24 hr tablet Take 60 mg by mouth daily.   Yes [provider]  levothyroxine (SYNTHROID) 75 MCG tablet Take 75 mcg by mouth in the morning. 12/18/20  Yes [provider]  LORazepam (ATIVAN) 1 MG tablet Take 1 mg by mouth 2 (two) times daily as needed for anxiety.   Yes Levert Feinstein, MD  Multiple Vitamin (MULTIVITAMIN) tablet Take 1 tablet by mouth every morning.   Yes [provider]  ondansetron (ZOFRAN-ODT) 8 MG disintegrating tablet Take 1 tablet (8 mg total) by mouth every 8 (eight) hours as needed for nausea or vomiting. 06/11/22  Yes Dione Booze, MD  QUEtiapine (SEROQUEL) 25 MG tablet TAKE 1 TABLET BY MOUTH EACH MORNING, 1 TABLET IN THE AFTERNOON , AND 3 TABLETS AT BEDTIME Patient taking differently: Take 25-75 mg by mouth See admin instructions. TAKE 1 TABLET BY MOUTH EACH MORNING, 1 TABLET IN THE AFTERNOON , AND 3 TABLETS AT BEDTIME 11/25/23  Yes Levert Feinstein, MD  sertraline (ZOLOFT) 100 MG tablet Take 200 mg by mouth in the morning.   Yes [provider]  simvastatin (ZOCOR) 40 MG tablet Take 40 mg by mouth at bedtime. 10/31/20  Yes [provider]  trimethoprim (TRIMPEX) 100 MG tablet Take 1 tablet by mouth at bedtime. 11/24/23 02/22/24 Yes [provider]      Allergies    Nsaids    Review of Systems   Review of Systems  Genitourinary:  Positive for dysuria.  All other systems reviewed and are negative.   Physical Exam Updated Vital Signs BP (!) 125/95   Pulse 75   Temp 98.4 F (36.9 C)   Resp 18   Wt 54.9 kg   SpO2 100%   BMI 20.77 kg/m  Physical Exam Vitals and nursing note reviewed.  Constitutional:      General: She [name] not in acute distress.    Appearance: Normal appearance. She [name] well-developed. She [name] not  ill-appearing.  HENT:     Head: Normocephalic and atraumatic.  Eyes:     Conjunctiva/sclera: Conjunctivae normal.  Cardiovascular:     Rate and Rhythm: Normal rate and regular rhythm.     Heart sounds: No murmur heard. Pulmonary:     Effort: Pulmonary effort [name] normal. No respiratory distress.     Breath sounds: Normal breath sounds.  Abdominal:     Palpations: Abdomen [name] soft.     Tenderness: There [name] no abdominal tenderness.  Musculoskeletal:        General: No swelling.     Cervical back: Neck supple.  Skin:    General: Skin [name] warm and dry.     Capillary Refill: Capillary refill takes less than 2 seconds.  Neurological:     Mental Status: She [name] alert.  Psychiatric:        Mood and Affect: Mood [name] anxious.        Speech: Speech [name] tangential.     ED Results / Procedures / Treatments   Labs (all labs ordered are listed, but only abnormal results are displayed) Labs Reviewed  CBC WITH DIFFERENTIAL/PLATELET - Abnormal; Notable for the following components:      Result Value   RBC 3.64 (*)    Hemoglobin 11.8 (*)    HCT 34.9 (*)    All other components within normal limits  COMPREHENSIVE METABOLIC PANEL - Abnormal; Notable for the following components:   Total Protein 6.3 (*)    All other components within normal limits  URINALYSIS, W/ REFLEX TO CULTURE (INFECTION SUSPECTED) - Abnormal; Notable for the following components:   APPearance HAZY (*)    Ketones, ur 5 (*)    Leukocytes,Ua MODERATE (*)    Bacteria, UA RARE (*)    All other components within normal limits  RESP PANEL BY RT-PCR (RSV, FLU A&B, COVID)  RVPGX2  URINE CULTURE  TSH  BASIC METABOLIC PANEL  CBC    EKG None  Radiology CT ABDOMEN PELVIS W CONTRAST Result Date: 12/21/2023 CLINICAL DATA:  Acute abdominal pain EXAM: CT ABDOMEN AND PELVIS WITH CONTRAST TECHNIQUE: Multidetector CT imaging of the abdomen and pelvis was performed using the standard protocol following bolus administration of  intravenous contrast. RADIATION DOSE REDUCTION: This exam was performed according to the departmental dose-optimization program which includes automated exposure control, adjustment of the mA and/or kV according to patient size and/or use of iterative reconstruction technique. CONTRAST:  75mL OMNIPAQUE IOHEXOL 350 MG/ML SOLN COMPARISON:  CT abdomen and pelvis 07/14/2023 FINDINGS: Lower chest: No acute abnormality. Hepatobiliary: Hypodense lesion in the left lobe of the liver measures 1.8 x 2.3 cm and appears unchanged. Hemangioma in the central left lobe of the liver [name] also unchanged measuring 2.1 by 1.5 cm.  No new lesions are identified. Gallbladder and bile ducts are within normal limits. Pancreas: Unremarkable. No pancreatic ductal dilatation or surrounding inflammatory changes. Spleen: Subcentimeter hypodensities in the spleen are too small to characterize and unchanged. The spleen [name] not enlarged. Adrenals/Urinary Tract: There [name] a 4.1 x 4.1 cm left renal cyst this [name] unchanged. Otherwise the adrenal glands and kidneys are within normal limits. There [name] a small amount of air in the bladder. The bladder otherwise appears normal. Stomach/Bowel: Again seen [name] a large hiatal hernia with intrathoracic stomach. Organo-axial volvulus of the stomach again noted. The stomach [name] nondilated. Appendix [name] not seen. No evidence of bowel wall thickening, distention, or inflammatory changes. Vascular/Lymphatic: Aortic atherosclerosis. No enlarged abdominal or pelvic lymph nodes. Reproductive: Lobulated uterine contour again noted likely related to fibroid change. The ovaries are nonenlarged. Other: No abdominal wall hernia or abnormality. No abdominopelvic ascites. Musculoskeletal: Degenerative changes affect the spine. Left hip arthroplasty [name] present. IMPRESSION: 1. Stable large hiatal hernia with intrathoracic stomach and organo-axial volvulus. No evidence for obstruction. 2. Small amount of air in the bladder. Correlate  for recent instrumentation or infection. 3. Stable hepatic hemangioma and indeterminate hypodense lesion in the left lobe of the liver. This can be further evaluated with MRI or ultrasound. 4. Stable left renal cyst. No follow-up imaging recommended. 5. Fibroid uterus. Aortic Atherosclerosis (ICD10-I70.0). Electronically Signed   By: Darliss Cheney M.D.   On: 12/21/2023 20:10    Procedures Procedures    Medications Ordered in ED Medications  cefTRIAXone (ROCEPHIN) 1 g in sodium chloride 0.9 % 100 mL IVPB (1 g Intravenous New Bag/Given 12/21/23 2235)  heparin injection 5,000 Units (has no administration in time range)  acetaminophen (TYLENOL) tablet 500 mg (has no administration in time range)    Or  acetaminophen (TYLENOL) suppository 650 mg (has no administration in time range)  ondansetron (ZOFRAN) tablet 4 mg (has no administration in time range)    Or  ondansetron (ZOFRAN) injection 4 mg (has no administration in time range)  bisacodyl (DULCOLAX) EC tablet 5 mg (has no administration in time range)  cefTRIAXone (ROCEPHIN) 1 g in sodium chloride 0.9 % 100 mL IVPB (has no administration in time range)  iohexol (OMNIPAQUE) 350 MG/ML injection 75 mL (75 mLs Intravenous Contrast Given 12/21/23 1950)    ED Course/ Medical Decision Making/ A&P                                 Medical Decision Making Amount and/or Complexity of Data Reviewed Labs: ordered. Radiology: ordered.  Risk Prescription drug management. Decision regarding hospitalization.   This patient presents to the ED for concern of dysuria, altered mental status.  Differential diagnosis includes UTI, pyelonephritis, urolithiasis, bowel obstruction, urosepsis   Lab Tests:  I Ordered, and personally interpreted labs.  The pertinent results include: CBC with slightly improved hemoglobin, UA consistent with infection with leukocytes and bacteria seen, CMP unremarkable no evidence of AKI, respiratory panel negative for  COVID-19, flu and RSV, urine culture collected and pending   Imaging Studies ordered:  I ordered imaging studies including CT abdomen pelvis I independently visualized and interpreted imaging which showed 1. Stable large hiatal hernia with intrathoracic stomach and organo-axial volvulus. No evidence for obstruction. 2. Small amount of air in the bladder. Correlate for recent instrumentation or infection. 3. Stable hepatic hemangioma and indeterminate hypodense lesion in the left lobe of the liver. This can be  further evaluated with MRI or ultrasound. 4. Stable left renal cyst. No follow-up imaging recommended. 5. Fibroid uterus. I agree with the radiologist interpretation   Medicines ordered and prescription drug management:  I ordered medication including Rocephin for UTI Reevaluation of the patient after these medicines showed that the patient stayed the same I have reviewed the patients home medicines and have made adjustments as needed   Problem List / ED Course:  Patient with past history significant for [name], [name], [name], [name], [name] at bedside that it [name] patient's caretaker reports that she has had increasing agitation and confusion over the last several weeks.  Was initially seen by her primary care provider about 1 month ago initially diagnosed with a UTI and started antibiotics but has not had significant proven symptoms and still endorsing some painful urination.  Was seen in the emergency department 3 days ago and started on Keflex.  No improvement since then with symptoms of confusion, agitation, and urinary discomfort. Physical exam [name] large unremarkable.  Patient able to answer questions and ask however she does not appear to be somewhat confused to time.  Unclear what patient's baseline [name] given her history of [name] but daughter at bedside reports that she [name] notably altered.  No recent fever,  chills or bodyaches as far as family [name] aware.  There are some generalized abdominal tenderness with some guarding present with some tenderness to palpation over the flanks.  With no obvious hematuria, doubtful of urolithiasis but will obtain imaging to rule out any other possible pathology. Workup revealed the patient still has UTI present but not unexpected as patient has only been antibiotics for about 3 days with reported inconsistent use of antibiotics.  White blood count [name] unremarkable and normal at 4.7.  No indications for urosepsis at this time. Rest of lab workup unremarkable.  CT abdomen pelvis negative for any acute findings although there [name] a notable large stable hiatal hernia.  Given family concerns of patient's altered status with increasing agitation, will consult with hospitalist for possible admission. Spoke with Dr. Gasper Sells, hospitalist, who will be admitting patient.  Final Clinical Impression(s) / ED Diagnoses Final diagnoses:  Altered mental status, unspecified altered mental status type  Acute cystitis without hematuria    Rx / DC Orders ED Discharge Orders     None         Smitty Knudsen, PA-C 12/21/23 2250    Terrilee Files, MD 12/22/23 1141

## 2023-12-21 NOTE — ED Notes (Signed)
 PT remains mildy anxious, alert, NAD, calmer, cooperative (more so with family present), EDPA at [facility] assessing and discussing plan.

## 2023-12-21 NOTE — Telephone Encounter (Signed)
 Pt's daughter called stating that they are worried that her UTI is worst than what they think. Daughter would like a call back when RN is available. Please advise.

## 2023-12-22 DIAGNOSIS — N3000 Acute cystitis without hematuria: Secondary | ICD-10-CM | POA: Diagnosis not present

## 2023-12-22 DIAGNOSIS — R410 Disorientation, unspecified: Secondary | ICD-10-CM | POA: Diagnosis not present

## 2023-12-22 DIAGNOSIS — F03918 Unspecified dementia, unspecified severity, with other behavioral disturbance: Secondary | ICD-10-CM | POA: Diagnosis not present

## 2023-12-22 DIAGNOSIS — I10 Essential (primary) hypertension: Secondary | ICD-10-CM

## 2023-12-22 LAB — BASIC METABOLIC PANEL
Anion gap: 12 (ref 5–15)
BUN: 14 mg/dL (ref 8–23)
CO2: 22 mmol/L (ref 22–32)
Calcium: 9.2 mg/dL (ref 8.9–10.3)
Chloride: 102 mmol/L (ref 98–111)
Creatinine, Ser: 0.78 mg/dL (ref 0.44–1.00)
GFR, Estimated: >60 mL/min (ref >60)
Glucose, Bld: 82 mg/dL (ref 70–99)
Potassium: 3.3 mmol/L — ABNORMAL LOW (ref 3.5–5.1)
Sodium: 136 mmol/L (ref 135–145)

## 2023-12-22 LAB — TSH: TSH: 2.616 u[IU]/mL (ref 0.350–4.500)

## 2023-12-22 LAB — CBC
HCT: 33.6 % — ABNORMAL LOW (ref 36.0–46.0)
Hemoglobin: 11.5 g/dL — ABNORMAL LOW (ref 12.0–15.0)
MCH: 31.9 pg (ref 26.0–34.0)
MCHC: 34.2 g/dL (ref 30.0–36.0)
MCV: 93.3 fL (ref 80.0–100.0)
Platelets: 183 10*3/uL (ref 150–400)
RBC: 3.6 MIL/uL — ABNORMAL LOW (ref 3.87–5.11)
RDW: 12.5 % (ref 11.5–15.5)
WBC: 5.6 10*3/uL (ref 4.0–10.5)
nRBC: 0 % (ref 0.0–0.2)

## 2023-12-22 LAB — GLUCOSE, CAPILLARY: Glucose-Capillary: 105 mg/dL — ABNORMAL HIGH (ref 70–99)

## 2023-12-22 MED ORDER — GABAPENTIN 100 MG PO CAPS
100.0000 mg | ORAL_CAPSULE | Freq: Two times a day (BID) | ORAL | Status: DC
  Administered 2023-12-22 – 2023-12-26 (×10): 100 mg via ORAL
  Filled 2023-12-22 (×10): qty 1

## 2023-12-22 MED ORDER — QUETIAPINE FUMARATE 25 MG PO TABS: 25.0000 mg | ORAL_TABLET | Freq: Two times a day (BID) | ORAL | Status: DC

## 2023-12-22 MED ORDER — SIMVASTATIN 20 MG PO TABS
40.0000 mg | ORAL_TABLET | Freq: Every day | ORAL | Status: DC
  Administered 2023-12-22 – 2023-12-25 (×4): 40 mg via ORAL
  Filled 2023-12-22 (×4): qty 2

## 2023-12-22 MED ORDER — LORAZEPAM 1 MG PO TABS
1.0000 mg | ORAL_TABLET | Freq: Two times a day (BID) | ORAL | Status: DC | PRN
  Administered 2023-12-22 – 2023-12-25 (×3): 1 mg via ORAL
  Filled 2023-12-22 (×3): qty 1

## 2023-12-22 MED ORDER — QUETIAPINE FUMARATE 25 MG PO TABS: 25.0000 mg | ORAL_TABLET | ORAL | Status: DC

## 2023-12-22 MED ORDER — QUETIAPINE FUMARATE 25 MG PO TABS: 25.0000 mg | ORAL_TABLET | Freq: Every day | ORAL | Status: DC

## 2023-12-22 MED ORDER — QUETIAPINE FUMARATE 50 MG PO TABS: 75.0000 mg | ORAL_TABLET | Freq: Every day | ORAL | Status: DC

## 2023-12-22 MED ORDER — CALCIUM CARBONATE ANTACID 500 MG PO CHEW
1.0000 | CHEWABLE_TABLET | Freq: Three times a day (TID) | ORAL | Status: DC | PRN
  Administered 2023-12-25: 200 mg via ORAL
  Filled 2023-12-22: qty 1

## 2023-12-22 MED ORDER — VITAMIN D 25 MCG (1000 UNIT) PO TABS
5000.0000 [IU] | ORAL_TABLET | Freq: Every day | ORAL | Status: DC
  Administered 2023-12-22 – 2023-12-26 (×5): 5000 [IU] via ORAL
  Filled 2023-12-22 (×6): qty 5

## 2023-12-22 MED ORDER — POTASSIUM CHLORIDE CRYS ER 20 MEQ PO TBCR
40.0000 meq | EXTENDED_RELEASE_TABLET | Freq: Once | ORAL | Status: AC
  Administered 2023-12-22: 40 meq via ORAL
  Filled 2023-12-22: qty 2

## 2023-12-22 MED ORDER — FERROUS SULFATE 325 (65 FE) MG PO TABS
324.0000 mg | ORAL_TABLET | Freq: Every day | ORAL | Status: DC
  Administered 2023-12-22 – 2023-12-26 (×5): 324 mg via ORAL
  Filled 2023-12-22 (×5): qty 1

## 2023-12-22 MED ORDER — LEVOTHYROXINE SODIUM 75 MCG PO TABS
75.0000 ug | ORAL_TABLET | Freq: Every day | ORAL | Status: DC
  Administered 2023-12-22 – 2023-12-26 (×5): 75 ug via ORAL
  Filled 2023-12-22 (×6): qty 1

## 2023-12-22 MED ORDER — PHENAZOPYRIDINE HCL 100 MG PO TABS
100.0000 mg | ORAL_TABLET | Freq: Once | ORAL | Status: AC
Start: 2023-12-22 — End: 2023-12-22
  Administered 2023-12-22: 100 mg via ORAL
  Filled 2023-12-22: qty 1

## 2023-12-22 MED ORDER — SERTRALINE HCL 100 MG PO TABS
200.0000 mg | ORAL_TABLET | Freq: Every day | ORAL | Status: DC
  Administered 2023-12-22 – 2023-12-26 (×5): 200 mg via ORAL
  Filled 2023-12-22 (×5): qty 2

## 2023-12-22 NOTE — Evaluation (Signed)
 Occupational Therapy Evaluation Patient Details Name: [name] MRN: [id number] DOB: [birth year] Today's Date: 12/22/2023   History of Present Illness   Pt is an 83 y.o. female presenting 3/25 from home with UTI; per chart recently seen at [facility] ED for frequent falls and UTI but left AMA. PMH significant for CAD, HTN, dementia with agitation, frequent UTIs.     Clinical Impressions Pt evaluated s/p the admission list above. At baseline, pt lives at home alone but has 24/7 supervision between daughters and every other day caregiver. Pt receives supervision for ADLs and functional mobility due to impaired cognition and history of falls. Upon evaluation, pt was limited by cognition, ability to attend to task, and decreased safety awareness. Overall, pt completed functional mobility tasks with supervision requiring verbal cues for redirection to task. Based on evaluation, pt will require up to supervision for ADLs. OT will continue following pt acutely to address functional needs with discharge recommendations of HHOT for initial home evaluation to ensure safety and optimize caregiver education.     If plan is discharge home, recommend the following:   A little help with walking and/or transfers;A little help with bathing/dressing/bathroom;Assistance with cooking/housework;Direct supervision/assist for financial management;Direct supervision/assist for medications management;Assist for transportation;Help with stairs or ramp for entrance;Supervision due to cognitive status     Functional Status Assessment   Patient has had a recent decline in their functional status and demonstrates the ability to make significant improvements in function in a reasonable and predictable amount of time.     Equipment Recommendations   None recommended by OT     Recommendations for Other Services         Precautions/Restrictions   Precautions Precautions: Fall Recall of  Precautions/Restrictions: Impaired Restrictions Weight Bearing Restrictions Per Provider Order: No     Mobility Bed Mobility Overal bed mobility: Needs Assistance Bed Mobility: Sit to Supine       Sit to supine: Supervision   General bed mobility comments: supervision provided due to history of falls. No physical assistance required    Transfers Overall transfer level: Needs assistance Equipment used: None Transfers: Sit to/from Stand Sit to Stand: Supervision           General transfer comment: Supervision provided for safety; pt impulsively standing without verbal commands. No LOB observed      Balance Overall balance assessment: History of Falls, Needs assistance Sitting-balance support: No upper extremity supported, Feet supported Sitting balance-Leahy Scale: Good Sitting balance - Comments: static sitting EOB   Standing balance support: No upper extremity supported, During functional activity Standing balance-Leahy Scale: Good Standing balance comment: Pt completed functional mobility and dynamic bending and reaching to adjust pants, while standing unsupported. No LOB observed.                           ADL either performed or assessed with clinical judgement   ADL Overall ADL's : Needs assistance/impaired Eating/Feeding: Set up;Sitting   Grooming: Set up;Standing   Upper Body Bathing: Supervision/ safety;Sitting   Lower Body Bathing: Supervison/ safety;Sit to/from stand;Cueing for safety   Upper Body Dressing : Supervision/safety;Sitting   Lower Body Dressing: Supervision/safety;Sit to/from stand;Cueing for safety   Toilet Transfer: Supervision/safety;Ambulation;Regular Toilet;Cueing for safety   Toileting- Clothing Manipulation and Hygiene: Supervision/safety;Sit to/from stand;Cueing for safety       Functional mobility during ADLs: Supervision/safety;Cueing for safety       Vision Baseline Vision/History: 1 Wears glasses Ability  to  See in Adequate Light: 0 Adequate Patient Visual Report: No change from baseline Vision Assessment?: No apparent visual deficits     Perception Perception: Not tested       Praxis Praxis: Not tested       Pertinent Vitals/Pain Pain Assessment Pain Assessment: No/denies pain     Extremity/Trunk Assessment Upper Extremity Assessment Upper Extremity Assessment: Overall WFL for tasks assessed   Lower Extremity Assessment Lower Extremity Assessment: Defer to PT evaluation       Communication Communication Communication: No apparent difficulties   Cognition Arousal: Alert Behavior During Therapy: WFL for tasks assessed/performed Cognition: History of cognitive impairments             OT - Cognition Comments: Pt daughter present and reports pt cognition is at baseline. Pt impulsive and with decreased safety awareness, easily distracted internally and externally, decreased attention to task                 Following commands: Intact       Cueing  General Comments   Cueing Techniques: Verbal cues  VSS on RA; daughter at bedside   Exercises     Shoulder Instructions      Home Living Family/patient expects to be discharged to:: Private residence Living Arrangements: Alone Available Help at Discharge: Family;Personal care attendant;Available 24 hours/day Type of Home: House                       Home Equipment: Agricultural consultant (2 wheels)   Additional Comments: caregiver every other day and one of her daughters stays the other days and the other daughter stays nights.      Prior Functioning/Environment Prior Level of Function : Needs assist       Physical Assist : Mobility (physical);ADLs (physical) Mobility (physical): Transfers;Gait ADLs (physical): Toileting;Dressing;Bathing;Grooming;IADLs Mobility Comments: Pt completes functional ambulation without use of AD. Supervision provided for safety due to impaired balance and history of  falls ADLs Comments: Pt able to complete ADLs physically, supervision provided for safety due to impaired balance and history of falls. Assistance provided for IADLs due to impaired cognition    OT Problem List: Decreased strength;Impaired balance (sitting and/or standing);Decreased cognition;Decreased safety awareness   OT Treatment/Interventions: Self-care/ADL training;Therapeutic exercise;Therapeutic activities;Cognitive remediation/compensation;Patient/family education;Balance training      OT Goals(Current goals can be found in the care plan section)   Acute Rehab OT Goals Patient Stated Goal: none stated OT Goal Formulation: With patient Time For Goal Achievement: 01/05/24 ADL Goals Additional ADL Goal #1: Pt family will receive caregiver education on dementia progression and fall prevention Additional ADL Goal #2: Pt will attend for 2 minutes to ADL task in minimally distracting environment with MIN cues   OT Frequency:  Min 1X/week    Co-evaluation              AM-PAC OT "6 Clicks" Daily Activity     Outcome Measure Help from another person eating meals?: None Help from another person taking care of personal grooming?: A Little Help from another person toileting, which includes using toliet, bedpan, or urinal?: A Little Help from another person bathing (including washing, rinsing, drying)?: A Little Help from another person to put on and taking off regular upper body clothing?: A Little Help from another person to put on and taking off regular lower body clothing?: A Little 6 Click Score: 19   End of Session Equipment Utilized During Treatment: Gait belt Nurse Communication: Mobility status  Activity  Tolerance: Patient tolerated treatment well Patient left: in bed;with call bell/phone within reach;with family/visitor present  OT Visit Diagnosis: Muscle weakness (generalized) (M62.81);History of falling (Z91.81);Unsteadiness on feet (R26.81)                Time:  2956-2130 OT Time Calculation (min): 20 min Charges:  OT General Charges $OT Visit: 1 Visit OT Evaluation $OT Eval Low Complexity: 1 Low  [address], MOTS  Dillan Lunden 12/22/2023, 5:29 PM

## 2023-12-22 NOTE — Plan of Care (Signed)
  Problem: Coping: Goal: Level of anxiety will decrease Outcome: Not Progressing   Pt experiencing increased level of confusion and anxiety

## 2023-12-22 NOTE — H&P (Signed)
 History and Physical    Patient: [name] GNF:[id number] DOB: [birth year] DOA: 12/21/2023 DOS: the patient was seen and examined on 12/22/2023 PCP: Richmond Campbell., PA-C  Patient coming from: Home  Chief Complaint:  Chief Complaint  Patient presents with   Dysuria   HPI: [name] is a 83 y.o. female with medical history significant for coronary artery disease, hypertension,and dementia with agitation.  Over the last 3 months it appears that the patient's agitation has been a problem at home there have been multiple medication changes trying to address this.  It appears that 2 months ago her Seroquel dose was increased to 1 in the morning 2 in the afternoon and 3 in the evening.  About 1 month ago the patient was diagnosed with a UTI. 2 days ago she was brought into the emergency department for multiple falls with complaints that she felt dizzy.  She had also been suffering from headaches.  Her urinalysis was positive for UTI and the patient was prescribed Keflex.  The urine culture from that time revealed greater than 100,000 E. Coli.  Family feels she has not been taking her antibiotics as prescribed. While I was present the patient said she needed to get up to go to the bathroom so she got up very quickly and sat on the bedside commode but could not pee.  She said she felt like she had to go but nothing was coming out.  The nurse reports this has already happend once this the shift.   Her bladder scan was less than 400. The patient's family has already gone home for the night. My history comes from the ED provider and chart review.  Review of Systems: unable to review all systems due to the inability of the patient to answer questions. Past Medical History:  Diagnosis Date   Arthritis    Chest pain    Chronic back pain    "neurostimulator electrode leads overlie the lower thoracic spinal canal" - per cxr report 07/10/14   Chronic kidney disease    kidney stone   Chronic  UTI (urinary tract infection)    Coronary artery disease    NONOBSTRUCTIVE   Depression    Dizziness    GERD (gastroesophageal reflux disease)    Headache    History of kidney stones    Hyperlipidemia    Hypertension    Left foot drop    SINCE 2009 - RELATED TO BACK PROBLEM - WEARS BRACE   PONV (postoperative nausea and vomiting)    Spinal stenosis    Thyroid disease    hypothryoid   Past Surgical History:  Procedure Laterality Date   BACK SURGERY     CARDIAC CATHETERIZATION  02/22/2009   EF 60%   CATARACT EXTRACTION     both eyes   COLONOSCOPY     CYSTOSCOPY     FOR KIDNEY STONE   LUMBAR LAMINECTOMY/DECOMPRESSION MICRODISCECTOMY     TOTAL HIP ARTHROPLASTY Left 07/17/2014   Procedure: LEFT TOTAL HIP ARTHROPLASTY ANTERIOR APPROACH;  Surgeon: Shelda Pal, MD;  Location: [facility];  Service: Orthopedics;  Laterality: Left;   Social History:  reports that she has never smoked. She has never used smokeless tobacco. She reports that she does not drink alcohol and does not use drugs.  Allergies  Allergen Reactions   Nsaids Other (See Comments)    GI upset  other    Family History  Problem Relation Age of Onset   Cancer Mother  started in gallbladder then spread to liver   Heart attack Father    Colon cancer Neg Hx    Esophageal cancer Neg Hx    Rectal cancer Neg Hx    Stomach cancer Neg Hx     Prior to Admission medications   Medication Sig Start Date End Date Taking? Authorizing Provider  acetaminophen (TYLENOL) 500 MG tablet Take 500 mg by mouth every 6 (six) hours as needed for moderate pain.   Yes [provider]  aspirin 81 MG tablet Take 81 mg by mouth in the morning.   Yes [provider]  calcium carbonate (TUMS - DOSED IN MG ELEMENTAL CALCIUM) 500 MG chewable tablet Chew 1 tablet by mouth as needed for indigestion or heartburn.   Yes [provider]  cephALEXin (KEFLEX) 500 MG capsule Take 1 capsule (500 mg total) by mouth 2  (two) times daily for 5 days. 12/18/23 12/23/23 Yes Elayne Snare K, DO  Cholecalciferol (VITAMIN D) 125 MCG (5000 UT) CAPS Take 5,000 Units by mouth daily.   Yes [provider]  doxazosin (CARDURA) 8 MG tablet Take 8 mg by mouth daily.   Yes [provider]  ferrous sulfate 324 MG TBEC Take 324 mg by mouth in the morning.   Yes [provider]  fluticasone (FLONASE) 50 MCG/ACT nasal spray Place 1 spray into both nostrils daily as needed for allergies or rhinitis.   Yes [provider]  gabapentin (NEURONTIN) 100 MG capsule Take 100 mg by mouth 2 (two) times daily.   Yes [provider]  isosorbide mononitrate (IMDUR) 60 MG 24 hr tablet Take 60 mg by mouth daily.   Yes [provider]  levothyroxine (SYNTHROID) 75 MCG tablet Take 75 mcg by mouth in the morning. 12/18/20  Yes [provider]  LORazepam (ATIVAN) 1 MG tablet Take 1 mg by mouth 2 (two) times daily as needed for anxiety.   Yes Levert Feinstein, MD  Multiple Vitamin (MULTIVITAMIN) tablet Take 1 tablet by mouth every morning.   Yes [provider]  ondansetron (ZOFRAN-ODT) 8 MG disintegrating tablet Take 1 tablet (8 mg total) by mouth every 8 (eight) hours as needed for nausea or vomiting. 06/11/22  Yes Dione Booze, MD  QUEtiapine (SEROQUEL) 25 MG tablet TAKE 1 TABLET BY MOUTH EACH MORNING, 1 TABLET IN THE AFTERNOON , AND 3 TABLETS AT BEDTIME Patient taking differently: Take 25-75 mg by mouth See admin instructions. TAKE 1 TABLET BY MOUTH EACH MORNING, 1 TABLET IN THE AFTERNOON , AND 3 TABLETS AT BEDTIME 11/25/23  Yes Levert Feinstein, MD  sertraline (ZOLOFT) 100 MG tablet Take 200 mg by mouth in the morning.   Yes [provider]  simvastatin (ZOCOR) 40 MG tablet Take 40 mg by mouth at bedtime. 10/31/20  Yes [provider]  trimethoprim (TRIMPEX) 100 MG tablet Take 1 tablet by mouth at bedtime. 11/24/23 02/22/24 Yes [provider]    Physical  Exam: Vitals:   12/21/23 2000 12/21/23 2045 12/21/23 2345 12/22/23 0017  BP: (!) 159/82 (!) 125/95 (!) 149/79 (!) 178/96  Pulse: (!) 134 75 71 78  Resp:  18  18  Temp:  98.3 F (36.8 C)  98.4 F (36.9 C)  TempSrc:    Oral  SpO2: 99% 100% 97% 98%  Weight:       Physical Exam:  General: No acute distress, well developed, well nourished HEENT: Normocephalic, atraumatic, PERRL Cardiovascular: Normal rate and rhythm. Distal pulses intact. Pulmonary: Normal pulmonary  effort, normal breath sounds Gastrointestinal: Nondistended abdomen, soft, non-tender, normoactive bowel sounds, no organomegaly Musculoskeletal:Normal ROM, no lower ext edema Lymphadenopathy: No cervical LAD. Skin: Skin is warm and dry. Neuro: No focal deficits noted, AAOx3. PSYCH: Attentive and cooperative  Data Reviewed:  Results for orders placed or performed during the hospital encounter of 12/21/23 (from the past 24 hours)  CBC with Differential     Status: Abnormal   Collection Time: 12/21/23  5:29 PM  Result Value Ref Range   WBC 4.7 4.0 - 10.5 K/uL   RBC 3.64 (L) 3.87 - 5.11 MIL/uL   Hemoglobin 11.8 (L) 12.0 - 15.0 g/dL   HCT 29.5 (L) 28.4 - 13.2 %   MCV 95.9 80.0 - 100.0 fL   MCH 32.4 26.0 - 34.0 pg   MCHC 33.8 30.0 - 36.0 g/dL   RDW 44.0 10.2 - 72.5 %   Platelets 173 150 - 400 K/uL   nRBC 0.0 0.0 - 0.2 %   Neutrophils Relative % 66 %   Neutro Abs 3.1 1.7 - 7.7 K/uL   Lymphocytes Relative 25 %   Lymphs Abs 1.2 0.7 - 4.0 K/uL   Monocytes Relative 6 %   Monocytes Absolute 0.3 0.1 - 1.0 K/uL   Eosinophils Relative 3 %   Eosinophils Absolute 0.1 0.0 - 0.5 K/uL   Basophils Relative 0 %   Basophils Absolute 0.0 0.0 - 0.1 K/uL   Immature Granulocytes 0 %   Abs Immature Granulocytes 0.01 0.00 - 0.07 K/uL  Comprehensive metabolic panel     Status: Abnormal   Collection Time: 12/21/23  5:29 PM  Result Value Ref Range   Sodium 138 135 - 145 mmol/L   Potassium 3.6 3.5 - 5.1 mmol/L   Chloride 104 98 -  111 mmol/L   CO2 24 22 - 32 mmol/L   Glucose, Bld 92 70 - 99 mg/dL   BUN 16 8 - 23 mg/dL   Creatinine, Ser 3.66 0.44 - 1.00 mg/dL   Calcium 9.1 8.9 - 44.0 mg/dL   Total Protein 6.3 (L) 6.5 - 8.1 g/dL   Albumin 3.8 3.5 - 5.0 g/dL   AST 24 15 - 41 U/L   ALT 12 0 - 44 U/L   Alkaline Phosphatase 95 38 - 126 U/L   Total Bilirubin 0.7 0.0 - 1.2 mg/dL   GFR, Estimated >34 >74 mL/min   Anion gap 10 5 - 15  Resp panel by RT-PCR (RSV, Flu A&B, Covid) Anterior Nasal Swab     Status: None   Collection Time: 12/21/23  5:29 PM   Specimen: Anterior Nasal Swab  Result Value Ref Range   SARS Coronavirus 2 by RT PCR NEGATIVE NEGATIVE   Influenza A by PCR NEGATIVE NEGATIVE   Influenza B by PCR NEGATIVE NEGATIVE   Resp Syncytial Virus by PCR NEGATIVE NEGATIVE  Urinalysis, w/ Reflex to Culture (Infection Suspected) -Urine, Clean Catch     Status: Abnormal   Collection Time: 12/21/23  5:30 PM  Result Value Ref Range   Specimen Source URINE, CLEAN CATCH    Color, Urine YELLOW YELLOW   APPearance HAZY (A) CLEAR   Specific Gravity, Urine 1.019 1.005 - 1.030   pH 6.0 5.0 - 8.0   Glucose, UA NEGATIVE NEGATIVE mg/dL   Hgb urine dipstick NEGATIVE NEGATIVE   Bilirubin Urine NEGATIVE NEGATIVE   Ketones, ur 5 (A) NEGATIVE mg/dL   Protein, ur NEGATIVE NEGATIVE mg/dL   Nitrite NEGATIVE NEGATIVE   Leukocytes,Ua MODERATE (A) NEGATIVE  RBC / HPF 0-5 0 - 5 RBC/hpf   WBC, UA 11-20 0 - 5 WBC/hpf   Bacteria, UA RARE (A) NONE SEEN   Squamous Epithelial / HPF 0-5 0 - 5 /HPF   Mucus PRESENT    Hyaline Casts, UA PRESENT    Ca Oxalate Crys, UA PRESENT     CT of abdomen/ pelvis IMPRESSION: 1. Stable large hiatal hernia with intrathoracic stomach and organo-axial volvulus. No evidence for obstruction. 2. Small amount of air in the bladder. Correlate for recent instrumentation or infection. 3. Stable hepatic hemangioma and indeterminate hypodense lesion in the left lobe of the liver. This can be further  evaluated with MRI or ultrasound. 4. Stable left renal cyst. No follow-up imaging recommended. 5. Fibroid uterus.   Assessment and Plan: Delirium with baseline dementia - I witnessed the patient unable to urinate.   At this time I am worried about the anticholinergic properties of her Seroquel which has ben increased twice over the last few months.  Will hold her Seroquel.  Restart at a lower dose in a few days.  - Will also treat her E. coli UTI with IV Rocephin  2.  UTI - failed outaptient manageemnt - IV Ceftriaxone  3. Weakness and multiple falls - PT/OT     Advance Care Planning:   Code Status: Full Code the patient will be full code by default as she does not have capacity to make that decision.  Consults: none  Family Communication: none  Severity of Illness: The appropriate patient status for this patient is INPATIENT. Inpatient status is judged to be reasonable and necessary in order to provide the required intensity of service to ensure the patient's safety. The patient's presenting symptoms, physical exam findings, and initial radiographic and laboratory data in the context of their chronic comorbidities is felt to place them at high risk for further clinical deterioration. Furthermore, it is not anticipated that the patient will be medically stable for discharge from the hospital within 2 midnights of admission.   * I certify that at the point of admission it is my clinical judgment that the patient will require inpatient hospital care spanning beyond 2 midnights from the point of admission due to high intensity of service, high risk for further deterioration and high frequency of surveillance required.*  Author: Buena Irish, MD 12/22/2023 1:40 AM  For on call review [email or web address].

## 2023-12-22 NOTE — TOC Initial Note (Signed)
 Transition of Care [facility]) - Initial/Assessment Note    Patient Details  Name: [name] MRN: [id number] Date of Birth: 04/13/1941  [facility]) CM/SW Contact:    Kermit Balo, RN Phone Number: 12/22/2023, 1:39 PM  Clinical Narrative:                  Pt is from home alone but has caregiver every other day and one of her daughters stays the other days and the other daughter stays nights.  Daughters manage her medications and provide needed transportation.  Daughter at the bedside says they are waiting on medicaid to increase home services.  TOC following.  Expected Discharge Plan: Home/Self Care Barriers to Discharge: Continued Medical Work up   Patient Goals and CMS Choice            Expected Discharge Plan and Services   Discharge Planning Services: CM Consult   Living arrangements for the past 2 months: Single Family Home                                      Prior Living Arrangements/Services Living arrangements for the past 2 months: Single Family Home Lives with:: Self Patient language and need for interpreter reviewed:: Yes          Care giver support system in place?: Yes (comment) Current home services: DME (cane/ walker/ wheelchair/ shower seat) Criminal Activity/Legal Involvement Pertinent to Current Situation/Hospitalization: No - Comment as needed  Activities of Daily Living   ADL Screening (condition at time of admission) Independently performs ADLs?: Yes (appropriate for developmental age) Is the patient deaf or have difficulty hearing?: No Does the patient have difficulty seeing, even when wearing glasses/contacts?: No Does the patient have difficulty concentrating, remembering, or making decisions?: Yes  Permission Sought/Granted                  Emotional Assessment Appearance:: Appears stated age Attitude/Demeanor/Rapport:  (confused) Affect (typically observed): Calm Orientation: : Oriented to Self    Psych Involvement: No (comment)  Admission diagnosis:  Delirium [R41.0] Acute cystitis without hematuria [N30.00] Altered mental status, unspecified altered mental status type [R41.82] Patient Active Problem List   Diagnosis Date Noted   Delirium 12/21/2023   Altered mental status 06/30/2022   Aggressive behavior 01/27/2021   Dementia with behavioral disturbance (HCC) 01/14/2021   Memory loss 09/17/2020   Small vessel disease, 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 [place] [address] Phone: [phone] Fax: [phone]  [facility] [id number] [place], [place] - [address] [place] [address] Phone: [phone] Fax: [phone]  [facility] [address] [place] [address] Phone: [phone] Fax: [phone]     Social Drivers of Health (SDOH) Social History: SDOH Screenings   Food Insecurity: Patient Unable To Answer (12/22/2023)  Housing: Patient Unable To Answer (12/22/2023)  Transportation Needs: Patient Unable To Answer (12/22/2023)  Utilities: Patient Unable To Answer (12/22/2023)  Social Connections: Unknown (12/22/2023)  Tobacco Use: Low Risk  (12/21/2023)  Recent Concern: Tobacco Use - Medium Risk (11/24/2023)   Received from [facility]   SDOH Interventions:     Readmission Risk Interventions     No data to display

## 2023-12-23 DIAGNOSIS — R410 Disorientation, unspecified: Secondary | ICD-10-CM | POA: Diagnosis not present

## 2023-12-23 LAB — COMPREHENSIVE METABOLIC PANEL WITH GFR
ALT: 13 U/L (ref 0–44)
AST: 21 U/L (ref 15–41)
Albumin: 3.7 g/dL (ref 3.5–5.0)
Alkaline Phosphatase: 93 U/L (ref 38–126)
Anion gap: 8 (ref 5–15)
BUN: 10 mg/dL (ref 8–23)
CO2: 26 mmol/L (ref 22–32)
Calcium: 9.3 mg/dL (ref 8.9–10.3)
Chloride: 103 mmol/L (ref 98–111)
Creatinine, Ser: 0.84 mg/dL (ref 0.44–1.00)
GFR, Estimated: >60 mL/min (ref >60)
Glucose, Bld: 88 mg/dL (ref 70–99)
Potassium: 4.2 mmol/L (ref 3.5–5.1)
Sodium: 137 mmol/L (ref 135–145)
Total Bilirubin: 0.8 mg/dL (ref 0.0–1.2)
Total Protein: 6.5 g/dL (ref 6.5–8.1)

## 2023-12-23 LAB — MAGNESIUM: Magnesium: 2.1 mg/dL (ref 1.7–2.4)

## 2023-12-23 LAB — URINE CULTURE: Culture: NO GROWTH

## 2023-12-23 LAB — PHOSPHORUS: Phosphorus: 3.5 mg/dL (ref 2.5–4.6)

## 2023-12-23 LAB — GLUCOSE, CAPILLARY: Glucose-Capillary: 88 mg/dL (ref 70–99)

## 2023-12-23 LAB — C-REACTIVE PROTEIN: CRP: 0.6 mg/dL (ref <1.0)

## 2023-12-23 NOTE — Progress Notes (Signed)
  Progress Note   Patient: [name]:[id number] DOB: 12/13/1940 DOA: 12/21/2023     2 DOS: the patient was seen and examined on 12/23/2023  Assessment and Plan: Delirium w/ baseline dementia - Mentation improving with IV antibx   E.coli UTI - IV ceftriaxone 1 g daily   Weakness and fall  - PT/OT following  - Vit D3 5000 units PO daily   Subjective: Pt seen and examined at the bedside. Mentation is improving with IV antibx. She is working well with PT/OT.  I suspect she will be ready for discharge in 1 - 2 days.  Physical Exam: Vitals:   12/22/23 1542 12/22/23 1926 12/23/23 0815 12/23/23 1229  BP: 138/82 (!) 169/77 (!) 155/67 122/62  Pulse: 74 76 66 74  Resp:  18  16  Temp: 99.4 F (37.4 C) 98.1 F (36.7 C) 98.5 F (36.9 C) 98.7 F (37.1 C)  TempSrc: Oral Oral Oral Oral  SpO2: 97% 97% 96% 98%  Weight:      Height:       Physical Exam HENT:     Head: Normocephalic.     Mouth/Throat:     Mouth: Mucous membranes are moist.  Cardiovascular:     Rate and Rhythm: Normal rate and regular rhythm.  Pulmonary:     Effort: Pulmonary effort is normal.  Abdominal:     Palpations: Abdomen is soft.  Musculoskeletal:        General: Normal range of motion.     Cervical back: Neck supple.  Skin:    General: Skin is warm.  Neurological:     Mental Status: She is alert. Mental status is at baseline.  Psychiatric:        Mood and Affect: Mood normal.     Disposition: Status is: Inpatient Remains inpatient appropriate because: IV antibx   Planned Discharge Destination: Home    Time spent: 35 minutes  Author: Baron Hamper , MD 12/23/2023 3:33 PM  For on call review [email or web address].

## 2023-12-23 NOTE — Plan of Care (Signed)
  Problem: Health Behavior/Discharge Planning: Goal: Ability to manage health-related needs will improve Outcome: Progressing   Problem: Clinical Measurements: Goal: Ability to maintain clinical measurements within normal limits will improve Outcome: Progressing Goal: Will remain free from infection Outcome: Progressing Goal: Diagnostic test results will improve Outcome: Progressing Goal: Respiratory complications will improve Outcome: Progressing Goal: Cardiovascular complication will be avoided Outcome: Progressing   Problem: Activity: Goal: Risk for activity intolerance will decrease Outcome: Progressing   Problem: Nutrition: Goal: Adequate nutrition will be maintained Outcome: Progressing   Problem: Elimination: Goal: Will not experience complications related to bowel motility Outcome: Progressing Goal: Will not experience complications related to urinary retention Outcome: Progressing   Problem: Pain Managment: Goal: General experience of comfort will improve and/or be controlled Outcome: Progressing   Problem: Skin Integrity: Goal: Risk for impaired skin integrity will decrease Outcome: Progressing

## 2023-12-23 NOTE — Progress Notes (Signed)
 Patient refused vital signs. Explained to the patient about the decision. Notified Dr. Janalyn Shy.

## 2023-12-23 NOTE — Plan of Care (Signed)
  Problem: Clinical Measurements: Goal: Ability to maintain clinical measurements within normal limits will improve Outcome: Progressing   Problem: Activity: Goal: Risk for activity intolerance will decrease Outcome: Progressing   Problem: Nutrition: Goal: Adequate nutrition will be maintained Outcome: Progressing   Problem: Elimination: Goal: Will not experience complications related to bowel motility Outcome: Progressing Goal: Will not experience complications related to urinary retention Outcome: Progressing   Problem: Pain Managment: Goal: General experience of comfort will improve and/or be controlled Outcome: Progressing   Problem: Safety: Goal: Ability to remain free from injury will improve Outcome: Progressing

## 2023-12-23 NOTE — TOC Progression Note (Signed)
 Transition of Care [facility]) - Progression Note    Patient Details  Name: [name] MRN: [id number] Date of Birth: [birth year]  Transition of Care [facility]) CM/SW Contact  Kermit Balo, RN Phone Number: 12/23/2023, 1:25 PM  Clinical Narrative:     Home health choice provided to family. No preference. Home health arranged with [facility]. Information on the AVS.  [facility] will contact them for the first home visit. TOC following.  Expected Discharge Plan: Home w Home Health Services Barriers to Discharge: Continued Medical Work up  Expected Discharge Plan and Services   Discharge Planning Services: CM Consult   Living arrangements for the past 2 months: Single Family Home                           HH Arranged: PT, OT [facility] Agency: [facility] Date [facility] Agency Contacted: 12/23/23   Representative spoke with at [facility] Agency: Amy   Social Determinants of Health (SDOH) Interventions SDOH Screenings   Food Insecurity: Patient Unable To Answer (12/22/2023)  Housing: Patient Unable To Answer (12/22/2023)  Transportation Needs: Patient Unable To Answer (12/22/2023)  Utilities: Patient Unable To Answer (12/22/2023)  Social Connections: Unknown (12/22/2023)  Tobacco Use: Low Risk  (12/21/2023)  Recent Concern: Tobacco Use - Medium Risk (11/24/2023)   Received from [facility]    Readmission Risk Interventions     No data to display

## 2023-12-23 NOTE — Evaluation (Signed)
 Physical Therapy Evaluation Patient Details Name: [name] MRN: [id number] DOB: [birth year] Today's Date: 12/23/2023  History of Present Illness  Pt is an 83 y.o. female presenting 3/25 from home with UTI; per chart recently seen at [facility] ED for frequent falls and UTI but left AMA. PMH significant for CAD, HTN, dementia with agitation, frequent UTIs.  Clinical Impression  Pt is presenting slightly below baseline level of functioning. Pt has assistance at home 24/7 from daughters and caregiver. Pt is currently supervision to CGA for bed mobility, sit to stand and gait. Pt used RW during gait demonstrating ability to complete head turns without LOB and statically stand without UE support without LOB. Due to pt current functional status, home set up and available assistance at home recommending skilled physical therapy services 3x/week in order to address strength, balance and functional mobility to decrease risk for falls, injury and re-hospitalization.           If plan is discharge home, recommend the following: A little help with walking and/or transfers;Assist for transportation;Assistance with cooking/housework     Equipment Recommendations None recommended by PT     Functional Status Assessment Patient has had a recent decline in their functional status and/or demonstrates limited ability to make significant improvements in function in a reasonable and predictable amount of time     Precautions / Restrictions Precautions Precautions: Fall Recall of Precautions/Restrictions: Impaired Restrictions Weight Bearing Restrictions Per Provider Order: No      Mobility  Bed Mobility Overal bed mobility: Needs Assistance Bed Mobility: Sit to Supine       Sit to supine: Supervision   General bed mobility comments: supervision provided due to history of falls. No physical assistance required    Transfers Overall transfer level: Needs assistance Equipment used: Rolling walker  (2 wheels) Transfers: Sit to/from Stand Sit to Stand: Supervision           General transfer comment: 1x verbal cues for hand placement.    Ambulation/Gait Ambulation/Gait assistance: Contact guard assist Gait Distance (Feet): 250 Feet Assistive device: Rolling walker (2 wheels) Gait Pattern/deviations: Steppage, Step-through pattern Gait velocity: decreased Gait velocity interpretation: 1.31 - 2.62 ft/sec, indicative of limited community ambulator   General Gait Details: Drop foot on the L with steppage gait pattern; spoke with daughter who states this is pt baseline.  Stairs Stairs: Yes Stairs assistance: Contact guard assist Stair Management: Two rails, Step to pattern, Forwards Number of Stairs: 2       Balance Overall balance assessment: History of Falls, Needs assistance Sitting-balance support: No upper extremity supported, Feet supported Sitting balance-Leahy Scale: Good Sitting balance - Comments: static sitting EOB and in recliner   Standing balance support: Bilateral upper extremity supported, During functional activity, Reliant on assistive device for balance Standing balance-Leahy Scale: Good Standing balance comment: static standing without an AD no LOB, pt able to perform head turns to look at objects without LOB during gait with RW.         Pertinent Vitals/Pain Pain Assessment Pain Assessment: No/denies pain    Home Living Family/patient expects to be discharged to:: Private residence Living Arrangements: Alone Available Help at Discharge: Family;Personal care attendant;Available 24 hours/day Type of Home: House           Home Equipment: Agricultural consultant (2 wheels) Additional Comments: caregiver every other day and one of her daughters stays the other days and the other daughter stays nights.    Prior Function Prior Level of Function :  Needs assist       Physical Assist : Mobility (physical);ADLs (physical) Mobility (physical):  Transfers;Gait ADLs (physical): Toileting;Dressing;Bathing;Grooming;IADLs Mobility Comments: Pt completes functional ambulation without use of AD. Supervision provided for safety due to impaired balance and history of falls per daughter ADLs Comments: Pt able to complete ADLs physically, supervision provided for safety due to impaired balance and history of falls. Assistance provided for IADLs due to impaired cognition     Extremity/Trunk Assessment   Upper Extremity Assessment Upper Extremity Assessment: Defer to OT evaluation    Lower Extremity Assessment Lower Extremity Assessment: Overall WFL for tasks assessed    Cervical / Trunk Assessment Cervical / Trunk Assessment: Kyphotic  Communication   Communication Communication: No apparent difficulties    Cognition Arousal: Alert   Following commands: Intact       Cueing Cueing Techniques: Verbal cues     General Comments General comments (skin integrity, edema, etc.): No signs/symptoms of cardiac/respirtory distress during session        Assessment/Plan    PT Assessment Patient needs continued PT services  PT Problem List Decreased strength;Decreased mobility;Decreased balance;Decreased safety awareness       PT Treatment Interventions DME instruction;Gait training;Therapeutic exercise;Balance training;Functional mobility training;Therapeutic activities;Patient/family education    PT Goals (Current goals can be found in the Care Plan section)  Acute Rehab PT Goals Patient Stated Goal: Improve mobiltiy PT Goal Formulation: With patient/family Time For Goal Achievement: 01/06/24 Potential to Achieve Goals: Fair    Frequency Min 1X/week        AM-PAC PT "6 Clicks" Mobility  Outcome Measure Help needed turning from your back to your side while in a flat bed without using bedrails?: A Little Help needed moving from lying on your back to sitting on the side of a flat bed without using bedrails?: A Little Help  needed moving to and from a bed to a chair (including a wheelchair)?: A Little Help needed standing up from a chair using your arms (e.g., wheelchair or bedside chair)?: A Little Help needed to walk in hospital room?: A Little Help needed climbing 3-5 steps with a railing? : A Little 6 Click Score: 18    End of Session Equipment Utilized During Treatment: Gait belt Activity Tolerance: Patient tolerated treatment well Patient left: in chair;with chair alarm set;with call bell/phone within reach Nurse Communication: Mobility status PT Visit Diagnosis: Unsteadiness on feet (R26.81)    Time: 1191-4782 PT Time Calculation (min) (ACUTE ONLY): 19 min   Charges:   PT Evaluation $PT Eval Low Complexity: 1 Low   PT General Charges $$ ACUTE PT VISIT: 1 Visit        Harrel Carina, DPT, CLT  Acute Rehabilitation Services Office: [phone] (Secure chat preferred)   Claudia Desanctis 12/23/2023, 11:51 AM

## 2023-12-24 DIAGNOSIS — R410 Disorientation, unspecified: Secondary | ICD-10-CM | POA: Diagnosis not present

## 2023-12-24 LAB — MAGNESIUM: Magnesium: 2.1 mg/dL (ref 1.7–2.4)

## 2023-12-24 LAB — COMPREHENSIVE METABOLIC PANEL WITH GFR
ALT: 15 U/L (ref 0–44)
AST: 25 U/L (ref 15–41)
Albumin: 3.5 g/dL (ref 3.5–5.0)
Alkaline Phosphatase: 87 U/L (ref 38–126)
Anion gap: 6 (ref 5–15)
BUN: 15 mg/dL (ref 8–23)
CO2: 27 mmol/L (ref 22–32)
Calcium: 8.8 mg/dL — ABNORMAL LOW (ref 8.9–10.3)
Chloride: 105 mmol/L (ref 98–111)
Creatinine, Ser: 0.91 mg/dL (ref 0.44–1.00)
GFR, Estimated: >60 mL/min (ref >60)
Glucose, Bld: 88 mg/dL (ref 70–99)
Potassium: 4.1 mmol/L (ref 3.5–5.1)
Sodium: 138 mmol/L (ref 135–145)
Total Bilirubin: 0.7 mg/dL (ref 0.0–1.2)
Total Protein: 6.3 g/dL — ABNORMAL LOW (ref 6.5–8.1)

## 2023-12-24 LAB — GLUCOSE, CAPILLARY
Glucose-Capillary: 115 mg/dL — ABNORMAL HIGH (ref 70–99)
Glucose-Capillary: 109 mg/dL — ABNORMAL HIGH (ref 70–99)

## 2023-12-24 LAB — PHOSPHORUS: Phosphorus: 4.1 mg/dL (ref 2.5–4.6)

## 2023-12-24 LAB — C-REACTIVE PROTEIN: CRP: 0.6 mg/dL (ref <1.0)

## 2023-12-24 MED ORDER — CEFADROXIL 500 MG PO CAPS
1000.0000 mg | ORAL_CAPSULE | Freq: Two times a day (BID) | ORAL | Status: AC
  Administered 2023-12-24 – 2023-12-25 (×4): 1000 mg via ORAL
  Filled 2023-12-24 (×4): qty 2

## 2023-12-24 NOTE — Care Management Important Message (Signed)
 Important Message  Patient Details  Name: [name] MRN: [id number] Date of Birth: [birth year]   Important Message Given:  Yes - Medicare IM     Dorena Bodo 12/24/2023, 2:32 PM

## 2023-12-24 NOTE — Progress Notes (Signed)
 Occupational Therapy Treatment Patient Details Name: [name] MRN: [id number] DOB: Dec 26, 1940 Today's Date: 12/24/2023   History of present illness Pt is an 83 y.o. female presenting 3/25 from home with UTI; per chart recently seen at [facility] ED for frequent falls and UTI but left AMA. PMH significant for CAD, HTN, dementia with agitation, frequent UTIs.   OT comments  Pt remains pleasantly confused and partially oriented to self only. Pt ambulatory in hall and completing ADLs with supervision. Challenged pt with having her recall the location of her room, she demonstrated impaired topographical orientation. OT to follow-up with pt for another session to focus on caregiver education since no family was present today. DC plans remain appropriate for [facility] with direct supervision for ADLs and mobility.       If plan is discharge home, recommend the following:  A little help with walking and/or transfers;A little help with bathing/dressing/bathroom;Assistance with cooking/housework;Direct supervision/assist for financial management;Direct supervision/assist for medications management;Assist for transportation;Help with stairs or ramp for entrance;Supervision due to cognitive status   Equipment Recommendations  None recommended by OT    Recommendations for Other Services      Precautions / Restrictions Precautions Precautions: Fall Recall of Precautions/Restrictions: Impaired Restrictions Weight Bearing Restrictions Per Provider Order: No       Mobility Bed Mobility Overal bed mobility: Needs Assistance Bed Mobility: Sit to Supine, Supine to Sit     Supine to sit: Supervision Sit to supine: Supervision   General bed mobility comments: supervision provided due to history of falls. No physical assistance required    Transfers Overall transfer level: Needs assistance Equipment used: Rolling walker (2 wheels) Transfers: Sit to/from Stand Sit to Stand: Supervision                  Balance Overall balance assessment: History of Falls, Needs assistance Sitting-balance support: No upper extremity supported, Feet supported Sitting balance-Leahy Scale: Good     Standing balance support: Bilateral upper extremity supported, During functional activity, Reliant on assistive device for balance Standing balance-Leahy Scale: Good                             ADL either performed or assessed with clinical judgement   ADL       Grooming: Standing;Brushing hair;Supervision/safety               Lower Body Dressing: Sitting/lateral leans;Supervision/safety               Functional mobility during ADLs: Supervision/safety;Cueing for safety General ADL Comments: Pt instructed to make bed, did well with organization and sequencing of task and maintaining balance while manipulating linens    Extremity/Trunk Assessment              Vision       Perception     Praxis     Communication Communication Communication: No apparent difficulties   Cognition Arousal: Alert Behavior During Therapy: WFL for tasks assessed/performed Cognition: History of cognitive impairments             OT - Cognition Comments: Pt pleasantly confused, incorrect recall of her year of birth and thinks that she is at some electrical facility and came here to apply for a job. Poor topographical orientation and impaired STM.                 Following commands: Intact        Cueing  Cueing Techniques: Verbal cues  Exercises      Shoulder Instructions       General Comments      Pertinent Vitals/ Pain       Pain Assessment Pain Assessment: No/denies pain  Home Living                                          Prior Functioning/Environment              Frequency  Min 1X/week        Progress Toward Goals  OT Goals(current goals can now be found in the care plan section)  Progress towards OT goals:  Progressing toward goals  Acute Rehab OT Goals OT Goal Formulation: With patient Time For Goal Achievement: 01/05/24  Plan      Co-evaluation                 AM-PAC OT "6 Clicks" Daily Activity     Outcome Measure   Help from another person eating meals?: None Help from another person taking care of personal grooming?: A Little Help from another person toileting, which includes using toliet, bedpan, or urinal?: A Little Help from another person bathing (including washing, rinsing, drying)?: A Little Help from another person to put on and taking off regular upper body clothing?: A Little Help from another person to put on and taking off regular lower body clothing?: A Little 6 Click Score: 19    End of Session Equipment Utilized During Treatment: Gait belt  OT Visit Diagnosis: Muscle weakness (generalized) (M62.81);History of falling (Z91.81);Unsteadiness on feet (R26.81)   Activity Tolerance Patient tolerated treatment well   Patient Left in bed;with call bell/phone within reach;with bed alarm set   Nurse Communication Mobility status        Time: 4098-1191 OT Time Calculation (min): 28 min  Charges: OT General Charges $OT Visit: 1 Visit OT Treatments $Therapeutic Activity: 23-37 mins  12/24/2023  AB, OTR/L  Acute Rehabilitation Services  Office: [phone]   Tristan Schroeder 12/24/2023, 4:49 PM

## 2023-12-24 NOTE — Progress Notes (Signed)
 Ok to change ceftriaxone to cefadroxil 1g PO BID to complete a total of 5d per Dr. Nelda Severe.  Ulyses Southward, PharmD, BCIDP, AAHIVP, CPP Infectious Disease Pharmacist 12/24/2023 11:51 AM

## 2023-12-24 NOTE — Progress Notes (Signed)
  Progress Note   Patient: [name] ZOX:[id number] DOB: 1941-08-08 DOA: 12/21/2023     3 DOS: the patient was seen and examined on 12/24/2023    Assessment and Plan: Delirium w/ baseline dementia - Mentation improving with antibx   E.coli UTI - PO duricef 1000 mg PO bid    Weakness and fall  - PT/OT following  - Vit D3 5000 units PO daily   Subjective: Pt seen and examined at the bedside. Mentation is improved. IV antibx changed to PO antibx today. Plan for discharge tmr.  Physical Exam: Vitals:   12/23/23 2346 12/24/23 0325 12/24/23 0816 12/24/23 1150  BP: (!) 145/78 (!) 144/74 (!) 150/77 (!) 127/53  Pulse: 71 (!) 59 64 68  Resp: 18 18 18 18   Temp: 98.3 F (36.8 C) 97.7 F (36.5 C) 97.9 F (36.6 C) 97.9 F (36.6 C)  TempSrc: Oral Oral Axillary Axillary  SpO2: 96% 97% 96% 95%  Weight:      Height:       HENT:     Head: Normocephalic.     Mouth/Throat:     Mouth: Mucous membranes are moist.  Cardiovascular:     Rate and Rhythm: Normal rate and regular rhythm.  Pulmonary:     Effort: Pulmonary effort is normal.  Abdominal:     Palpations: Abdomen is soft.  Musculoskeletal:        General: Normal range of motion.     Cervical back: Neck supple.  Skin:    General: Skin is warm.  Neurological:     Mental Status: She is alert. Mental status is at baseline.  Psychiatric:        Mood and Affect: Mood normal.      Disposition: Status is: Inpatient Remains inpatient appropriate because: antibx switch from IV to PO today  Planned Discharge Destination: Home    Time spent: 35 minutes  Author: Baron Hamper , MD 12/24/2023 12:50 PM  For on call review [email or web address].

## 2023-12-25 DIAGNOSIS — R410 Disorientation, unspecified: Secondary | ICD-10-CM | POA: Diagnosis not present

## 2023-12-25 LAB — COMPREHENSIVE METABOLIC PANEL WITH GFR
ALT: 15 U/L (ref 0–44)
AST: 22 U/L (ref 15–41)
Albumin: 3.5 g/dL (ref 3.5–5.0)
Alkaline Phosphatase: 80 U/L (ref 38–126)
Anion gap: 11 (ref 5–15)
BUN: 15 mg/dL (ref 8–23)
CO2: 23 mmol/L (ref 22–32)
Calcium: 9.3 mg/dL (ref 8.9–10.3)
Chloride: 102 mmol/L (ref 98–111)
Creatinine, Ser: 0.8 mg/dL (ref 0.44–1.00)
GFR, Estimated: >60 mL/min (ref >60)
Glucose, Bld: 94 mg/dL (ref 70–99)
Potassium: 3.8 mmol/L (ref 3.5–5.1)
Sodium: 136 mmol/L (ref 135–145)
Total Bilirubin: 0.7 mg/dL (ref 0.0–1.2)
Total Protein: 6 g/dL — ABNORMAL LOW (ref 6.5–8.1)

## 2023-12-25 LAB — GLUCOSE, CAPILLARY: Glucose-Capillary: 98 mg/dL (ref 70–99)

## 2023-12-25 LAB — MAGNESIUM: Magnesium: 2 mg/dL (ref 1.7–2.4)

## 2023-12-25 LAB — PHOSPHORUS: Phosphorus: 3.2 mg/dL (ref 2.5–4.6)

## 2023-12-25 LAB — C-REACTIVE PROTEIN: CRP: 0.6 mg/dL (ref <1.0)

## 2023-12-25 MED ORDER — QUETIAPINE FUMARATE 25 MG PO TABS
25.0000 mg | ORAL_TABLET | Freq: Once | ORAL | Status: AC
  Administered 2023-12-25: 25 mg via ORAL
  Filled 2023-12-25: qty 1

## 2023-12-25 MED ORDER — QUETIAPINE FUMARATE 50 MG PO TABS
75.0000 mg | ORAL_TABLET | Freq: Every day | ORAL | Status: DC
  Administered 2023-12-25: 75 mg via ORAL
  Filled 2023-12-25: qty 1

## 2023-12-25 MED ORDER — CEFADROXIL 500 MG PO CAPS: 1000.0000 mg | ORAL_CAPSULE | Freq: Two times a day (BID) | ORAL | 0 refills | Status: AC

## 2023-12-25 NOTE — Discharge Summary (Signed)
 Physician Discharge Summary   Patient: [name] MRN: [id number] DOB: 03-29-1941  Admit date:     12/21/2023  Discharge date: 12/26/2023  Discharge Physician: Baron Hamper    PCP: Richmond Campbell., PA-C     Discharge Diagnoses: Principal Problem:   Delirium Active Problems:   Dementia with behavioral disturbance [facility])  Resolved Problems:   * No resolved hospital problems. *  Hospital Course:  83 yo F admitted for delirium (w/ baseline dementia) in the setting of e.coli UTI. Pt also had weakness and falls at home.  Pt completed 3 days of IV ceftriaxone 1 g in the hospital. She was then transitioned to PO duricef 1000 mg PO bid. CRP was wnl. Mentation improved with the aforementioned antibx. Admitting physician held the seroquel on arrival due to the pt's presentation. Seroquel re-started per daughter request on 12/25/2023. Daughter advised they have seroquel at home. Pt will go home with PO antibx (duricef). PT/OT evaluated the pt prior to discharge. Per the documentation the pt was able to ambulate 250 feet.  Thus, pt's daughter have agreed to take the pt home on 12/25/2023 at 305 pm. (Daughter is currently at bedside).   PLEASE NOTE THE OFFICIAL DISCHARGE DATE IS 12/26/2023. ON 12/25/2023 THE PT'S DAUGHTER INITIALLY AGREED TO TAKE THE PT HOME BUT 1.5 HR AFTER AGREEING TO TAKE THE PT SHE SAID THAT SHE WAS NOT TAKING HER HOME.  ON THE MORNING OF 12/26/2023 THE PT'S DAUGHTER SHOWED UP AT THE HOSPITAL AND SAID SHE WAS GOING TO TAKE THE PT TODAY.  THUS, DISCHARGE DATE IS 12/26/2023.   DISCHARGE MEDICATION: Allergies as of 12/25/2023       Reactions   Nsaids Other (See Comments)   GI upset other        Medication List     STOP taking these medications    cephALEXin 500 MG capsule Commonly known as: KEFLEX       TAKE these medications    acetaminophen 500 MG tablet Commonly known as: TYLENOL Take 500 mg by mouth every 6 (six) hours as needed for moderate pain.    aspirin 81 MG tablet Take 81 mg by mouth in the morning.   calcium carbonate 500 MG chewable tablet Commonly known as: TUMS - dosed in mg elemental calcium Chew 1 tablet by mouth as needed for indigestion or heartburn.   cefadroxil 500 MG capsule Commonly known as: DURICEF Take 2 capsules (1,000 mg total) by mouth 2 (two) times daily for 1 day.   doxazosin 8 MG tablet Commonly known as: CARDURA Take 8 mg by mouth daily.   ferrous sulfate 324 MG Tbec Take 324 mg by mouth in the morning.   fluticasone 50 MCG/ACT nasal spray Commonly known as: FLONASE Place 1 spray into both nostrils daily as needed for allergies or rhinitis.   gabapentin 100 MG capsule Commonly known as: NEURONTIN Take 100 mg by mouth 2 (two) times daily.   isosorbide mononitrate 60 MG 24 hr tablet Commonly known as: IMDUR Take 60 mg by mouth daily.   levothyroxine 75 MCG tablet Commonly known as: SYNTHROID Take 75 mcg by mouth in the morning.   LORazepam 1 MG tablet Commonly known as: ATIVAN Take 1 mg by mouth 2 (two) times daily as needed for anxiety.   multivitamin tablet Take 1 tablet by mouth every morning.   ondansetron 8 MG disintegrating tablet Commonly known as: ZOFRAN-ODT Take 1 tablet (8 mg total) by mouth every 8 (eight) hours as needed for  nausea or vomiting.   QUEtiapine 25 MG tablet Commonly known as: SEROQUEL TAKE 1 TABLET BY MOUTH EACH MORNING, 1 TABLET IN THE AFTERNOON , AND 3 TABLETS AT BEDTIME What changed: See the new instructions.   sertraline 100 MG tablet Commonly known as: ZOLOFT Take 200 mg by mouth in the morning.   simvastatin 40 MG tablet Commonly known as: ZOCOR Take 40 mg by mouth at bedtime.   trimethoprim 100 MG tablet Commonly known as: TRIMPEX Take 1 tablet by mouth at bedtime.   Vitamin D 125 MCG (5000 UT) Caps Take 5,000 Units by mouth daily.        Follow-up Information     [facility] Follow up.   Why: [facility]  will contact you for the first home visit Contact information: [address] [place] [address] [phone]                Discharge Exam: Filed Weights   12/21/23 1617  Weight: 54.9 kg   Physical Exam HENT:     Head: Normocephalic.     Mouth/Throat:     Mouth: Mucous membranes are moist.  Cardiovascular:     Rate and Rhythm: Normal rate.  Pulmonary:     Effort: Pulmonary effort is normal.  Abdominal:     Palpations: Abdomen is soft.  Musculoskeletal:        General: Normal range of motion.     Cervical back: Neck supple.  Skin:    General: Skin is warm.  Neurological:     Mental Status: She is alert. Mental status is at baseline.  Psychiatric:        Mood and Affect: Mood normal.      Condition at discharge: fair  The results of significant diagnostics from this hospitalization (including imaging, microbiology, ancillary and laboratory) are listed below for reference.   Imaging Studies: CT ABDOMEN PELVIS W CONTRAST Result Date: 12/21/2023 CLINICAL DATA:  Acute abdominal pain EXAM: CT ABDOMEN AND PELVIS WITH CONTRAST TECHNIQUE: Multidetector CT imaging of the abdomen and pelvis was performed using the standard protocol following bolus administration of intravenous contrast. RADIATION DOSE REDUCTION: This exam was performed according to the departmental dose-optimization program which includes automated exposure control, adjustment of the mA and/or kV according to patient size and/or use of iterative reconstruction technique. CONTRAST:  75mL OMNIPAQUE IOHEXOL 350 MG/ML SOLN COMPARISON:  CT abdomen and pelvis 07/14/2023 FINDINGS: Lower chest: No acute abnormality. Hepatobiliary: Hypodense lesion in the left lobe of the liver measures 1.8 x 2.3 cm and appears unchanged. Hemangioma in the central left lobe of the liver is also unchanged measuring 2.1 by 1.5 cm. No new lesions are identified. Gallbladder and bile ducts are within normal limits. Pancreas:  Unremarkable. No pancreatic ductal dilatation or surrounding inflammatory changes. Spleen: Subcentimeter hypodensities in the spleen are too small to characterize and unchanged. The spleen is not enlarged. Adrenals/Urinary Tract: There is a 4.1 x 4.1 cm left renal cyst this is unchanged. Otherwise the adrenal glands and kidneys are within normal limits. There is a small amount of air in the bladder. The bladder otherwise appears normal. Stomach/Bowel: Again seen is a large hiatal hernia with intrathoracic stomach. Organo-axial volvulus of the stomach again noted. The stomach is nondilated. Appendix is not seen. No evidence of bowel wall thickening, distention, or inflammatory changes. Vascular/Lymphatic: Aortic atherosclerosis. No enlarged abdominal or pelvic lymph nodes. Reproductive: Lobulated uterine contour again noted likely related to fibroid change.  The ovaries are nonenlarged. Other: No abdominal wall hernia or abnormality. No abdominopelvic ascites. Musculoskeletal: Degenerative changes affect the spine. Left hip arthroplasty is present. IMPRESSION: 1. Stable large hiatal hernia with intrathoracic stomach and organo-axial volvulus. No evidence for obstruction. 2. Small amount of air in the bladder. Correlate for recent instrumentation or infection. 3. Stable hepatic hemangioma and indeterminate hypodense lesion in the left lobe of the liver. This can be further evaluated with MRI or ultrasound. 4. Stable left renal cyst. No follow-up imaging recommended. 5. Fibroid uterus. Aortic Atherosclerosis (ICD10-I70.0). Electronically Signed   By: Darliss Cheney M.D.   On: 12/21/2023 20:10   CT Head Wo Contrast Result Date: 12/18/2023 CLINICAL DATA:  Head trauma, minor (Age >= 65y) Headache, new onset (Age >= 51y); Neck trauma (Age >= 65y). Fall EXAM: CT HEAD WITHOUT CONTRAST CT CERVICAL SPINE WITHOUT CONTRAST TECHNIQUE: Multidetector CT imaging of the head and cervical spine was performed following the standard  protocol without intravenous contrast. Multiplanar CT image reconstructions of the cervical spine were also generated. RADIATION DOSE REDUCTION: This exam was performed according to the departmental dose-optimization program which includes automated exposure control, adjustment of the mA and/or kV according to patient size and/or use of iterative reconstruction technique. COMPARISON:  CT head and C-spine 07/08/2022, CT head 10 the 1624 FINDINGS: CT HEAD FINDINGS Brain: Patchy and confluent areas of decreased attenuation are noted throughout the deep and periventricular white matter of the cerebral hemispheres bilaterally, compatible with chronic microvascular ischemic disease. No evidence of large-territorial acute infarction. No parenchymal hemorrhage. No mass lesion. No extra-axial collection. No mass effect or midline shift. No hydrocephalus. Basilar cisterns are patent. Vascular: No hyperdense vessel. Skull: No acute fracture or focal lesion. Sinuses/Orbits: Paranasal sinuses and mastoid air cells are clear. Bilateral lens replacement. Otherwise the orbits are unremarkable. Other: None. CT CERVICAL SPINE FINDINGS Alignment: Normal. Skull base and vertebrae: Multilevel degenerative change of the spine most prominent at the C6-C7 level. No associated severe osseous neural foraminal or central canal stenosis. No acute fracture. No aggressive appearing focal osseous lesion or focal pathologic process. Soft tissues and spinal canal: No prevertebral fluid or swelling. No visible canal hematoma. Upper chest: Biapical pleural/pulmonary scarring. Other: Atherosclerotic plaque of the aortic arch and its main branches. IMPRESSION: 1. No acute intracranial abnormality. 2. No acute displaced fracture or traumatic listhesis of the cervical spine. 3.  Aortic Atherosclerosis (ICD10-I70.0). Electronically Signed   By: Tish Frederickson M.D.   On: 12/18/2023 18:15   CT Cervical Spine Wo Contrast Result Date: 12/18/2023 CLINICAL  DATA:  Head trauma, minor (Age >= 65y) Headache, new onset (Age >= 51y); Neck trauma (Age >= 65y). Fall EXAM: CT HEAD WITHOUT CONTRAST CT CERVICAL SPINE WITHOUT CONTRAST TECHNIQUE: Multidetector CT imaging of the head and cervical spine was performed following the standard protocol without intravenous contrast. Multiplanar CT image reconstructions of the cervical spine were also generated. RADIATION DOSE REDUCTION: This exam was performed according to the departmental dose-optimization program which includes automated exposure control, adjustment of the mA and/or kV according to patient size and/or use of iterative reconstruction technique. COMPARISON:  CT head and C-spine 07/08/2022, CT head 10 the 1624 FINDINGS: CT HEAD FINDINGS Brain: Patchy and confluent areas of decreased attenuation are noted throughout the deep and periventricular white matter of the cerebral hemispheres bilaterally, compatible with chronic microvascular ischemic disease. No evidence of large-territorial acute infarction. No parenchymal hemorrhage. No mass lesion. No extra-axial collection. No mass effect or midline shift. No hydrocephalus. Basilar  cisterns are patent. Vascular: No hyperdense vessel. Skull: No acute fracture or focal lesion. Sinuses/Orbits: Paranasal sinuses and mastoid air cells are clear. Bilateral lens replacement. Otherwise the orbits are unremarkable. Other: None. CT CERVICAL SPINE FINDINGS Alignment: Normal. Skull base and vertebrae: Multilevel degenerative change of the spine most prominent at the C6-C7 level. No associated severe osseous neural foraminal or central canal stenosis. No acute fracture. No aggressive appearing focal osseous lesion or focal pathologic process. Soft tissues and spinal canal: No prevertebral fluid or swelling. No visible canal hematoma. Upper chest: Biapical pleural/pulmonary scarring. Other: Atherosclerotic plaque of the aortic arch and its main branches. IMPRESSION: 1. No acute  intracranial abnormality. 2. No acute displaced fracture or traumatic listhesis of the cervical spine. 3.  Aortic Atherosclerosis (ICD10-I70.0). Electronically Signed   By: Tish Frederickson M.D.   On: 12/18/2023 18:15   DG Hand Complete Right Result Date: 12/18/2023 CLINICAL DATA:  Chest pain, frequent falls EXAM: RIGHT HAND - COMPLETE 3+ VIEW COMPARISON:  None Available. FINDINGS: There is no evidence of fracture or dislocation. First digit carpometacarpal joint degenerative changes. Ulnocarpal joint degenerative changes. There is no evidence of severe arthropathy or other focal bone abnormality. Soft tissues are unremarkable. IMPRESSION: No acute displaced fracture or dislocation. Electronically Signed   By: Tish Frederickson M.D.   On: 12/18/2023 18:09   DG Chest 1 View Result Date: 12/18/2023 CLINICAL DATA:  Chest pain, frequent falls EXAM: CHEST  1 VIEW COMPARISON:  Chest x-ray 07/07/2022 FINDINGS: Neural stimulator leads overlie the thoracic spine. Large hiatal hernia. The heart and mediastinal contours are unchanged. Atherosclerotic plaque. No focal consolidation. Chronic coarsened interstitial markings with no overt pulmonary edema. No pleural effusion. No pneumothorax. No acute osseous abnormality. IMPRESSION: 1. No active disease. 2. Aortic Atherosclerosis (ICD10-I70.0) and Emphysema (ICD10-J43.9). 3. Large hiatal hernia. Electronically Signed   By: Tish Frederickson M.D.   On: 12/18/2023 18:08    Microbiology: Results for orders placed or performed during the hospital encounter of 12/21/23  Resp panel by RT-PCR (RSV, Flu A&B, Covid) Anterior Nasal Swab     Status: None   Collection Time: 12/21/23  5:29 PM   Specimen: Anterior Nasal Swab  Result Value Ref Range Status   SARS Coronavirus 2 by RT PCR NEGATIVE NEGATIVE Final   Influenza A by PCR NEGATIVE NEGATIVE Final   Influenza B by PCR NEGATIVE NEGATIVE Final    Comment: (NOTE) The Xpert Xpress SARS-CoV-2/FLU/RSV plus assay is intended as an  aid in the diagnosis of influenza from Nasopharyngeal swab specimens and should not be used as a sole basis for treatment. Nasal washings and aspirates are unacceptable for Xpert Xpress SARS-CoV-2/FLU/RSV testing.  Fact Sheet for Patients: BloggerCourse.com  Fact Sheet for Healthcare Providers: SeriousBroker.it  This test is not yet approved or cleared by the [place] FDA and has been authorized for detection and/or diagnosis of SARS-CoV-2 by FDA under an Emergency Use Authorization (EUA). This EUA will remain in effect (meaning this test can be used) for the duration of the COVID-19 declaration under Section 564(b)(1) of the Act, 21 U.S.C. section 360bbb-3(b)(1), unless the authorization is terminated or revoked.     Resp Syncytial Virus by PCR NEGATIVE NEGATIVE Final    Comment: (NOTE) Fact Sheet for Patients: BloggerCourse.com  Fact Sheet for Healthcare Providers: SeriousBroker.it  This test is not yet approved or cleared by the [place] FDA and has been authorized for detection and/or diagnosis of SARS-CoV-2 by FDA under an Emergency Use Authorization (EUA). This  EUA will remain in effect (meaning this test can be used) for the duration of the COVID-19 declaration under Section 564(b)(1) of the Act, 21 U.S.C. section 360bbb-3(b)(1), unless the authorization is terminated or revoked.  Performed at [facility], [address] [place], [place] [address]   Urine Culture     Status: None   Collection Time: 12/21/23  5:30 PM   Specimen: Urine, Random  Result Value Ref Range Status   Specimen Description URINE, RANDOM  Final   Special Requests NONE Reflexed from [phone]  Final   Culture   Final    NO GROWTH Performed at [facility], [address] [place], [place] [address]    Report Status 12/23/2023 FINAL  Final    Labs: CBC: Recent Labs   Lab 12/18/23 1740 12/21/23 1729 12/22/23 0531  WBC 6.3 4.7 5.6  NEUTROABS 3.9 3.1  --   HGB 10.8* 11.8* 11.5*  HCT 31.8* 34.9* 33.6*  MCV 95.2 95.9 93.3  PLT 132* 173 183   Basic Metabolic Panel: Recent Labs  Lab 12/21/23 1729 12/22/23 0531 12/23/23 0655 12/24/23 0723 12/25/23 0550  NA 138 136 137 138 136  K 3.6 3.3* 4.2 4.1 3.8  CL 104 102 103 105 102  CO2 24 22 26 27 23   GLUCOSE 92 82 88 88 94  BUN 16 14 10 15 15   CREATININE 0.85 0.78 0.84 0.91 0.80  CALCIUM 9.1 9.2 9.3 8.8* 9.3  MG  --   --  2.1 2.1 2.0  PHOS  --   --  3.5 4.1 3.2   Liver Function Tests: Recent Labs  Lab 12/18/23 1740 12/21/23 1729 12/23/23 0655 12/24/23 0723 12/25/23 0550  AST 18 24 21 25 22   ALT 7 12 13 15 15   ALKPHOS 97 95 93 87 80  BILITOT 0.3 0.7 0.8 0.7 0.7  PROT 6.1* 6.3* 6.5 6.3* 6.0*  ALBUMIN 3.8 3.8 3.7 3.5 3.5   CBG: Recent Labs  Lab 12/22/23 2130 12/23/23 0607 12/24/23 1149 12/24/23 2152 12/25/23 0626  GLUCAP 105* 88 115* 109* 98    Discharge time spent: greater than 30 minutes.  Signed: Baron Hamper , MD [facility] 12/26/2023

## 2023-12-25 NOTE — TOC Transition Note (Signed)
 Transition of Care [facility]) - Discharge Note   Patient Details  Name: [name] MRN: [id number] Date of Birth: 10-27-1940  Transition of Care [facility]) CM/SW Contact:  Lawerance Sabal, RN Phone Number: 12/25/2023, 3:33 PM   Clinical Narrative:     Requested HH order for PT OT from attending and notified [facility] that patient will DC today     Barriers to Discharge: Continued Medical Work up   Patient Goals and CMS Choice            Discharge Placement                       Discharge Plan and Services Additional resources added to the After Visit Summary for     Discharge Planning Services: CM Consult                      HH Arranged: PT, OT [facility] Agency: [facility] Date [facility] Agency Contacted: 12/23/23   Representative spoke with at [facility] Agency: Amy  Social Drivers of Health (SDOH) Interventions SDOH Screenings   Food Insecurity: Patient Unable To Answer (12/22/2023)  Housing: Patient Unable To Answer (12/22/2023)  Transportation Needs: Patient Unable To Answer (12/22/2023)  Utilities: Patient Unable To Answer (12/22/2023)  Social Connections: Unknown (12/22/2023)  Tobacco Use: Low Risk  (12/21/2023)  Recent Concern: Tobacco Use - Medium Risk (11/24/2023)   Received from [facility]     Readmission Risk Interventions     No data to display

## 2023-12-25 NOTE — Plan of Care (Signed)
  Problem: Health Behavior/Discharge Planning: Goal: Ability to manage health-related needs will improve Outcome: Progressing   Problem: Clinical Measurements: Goal: Will remain free from infection Outcome: Progressing Goal: Respiratory complications will improve Outcome: Progressing Goal: Cardiovascular complication will be avoided Outcome: Progressing   Problem: Nutrition: Goal: Adequate nutrition will be maintained Outcome: Progressing   Problem: Pain Managment: Goal: General experience of comfort will improve and/or be controlled Outcome: Progressing   Problem: Safety: Goal: Ability to remain free from injury will improve Outcome: Progressing   Problem: Skin Integrity: Goal: Risk for impaired skin integrity will decrease Outcome: Progressing   Problem: Education: Goal: Knowledge of General Education information will improve Description: Including pain rating scale, medication(s)/side effects and non-pharmacologic comfort measures Outcome: Not Progressing   Problem: Coping: Goal: Level of anxiety will decrease Outcome: Not Progressing

## 2023-12-26 LAB — COMPREHENSIVE METABOLIC PANEL WITH GFR
ALT: 18 U/L (ref 0–44)
AST: 26 U/L (ref 15–41)
Albumin: 3.5 g/dL (ref 3.5–5.0)
Alkaline Phosphatase: 88 U/L (ref 38–126)
Anion gap: 11 (ref 5–15)
BUN: 16 mg/dL (ref 8–23)
CO2: 24 mmol/L (ref 22–32)
Calcium: 9.4 mg/dL (ref 8.9–10.3)
Chloride: 104 mmol/L (ref 98–111)
Creatinine, Ser: 0.84 mg/dL (ref 0.44–1.00)
GFR, Estimated: >60 mL/min (ref >60)
Glucose, Bld: 93 mg/dL (ref 70–99)
Potassium: 4 mmol/L (ref 3.5–5.1)
Sodium: 139 mmol/L (ref 135–145)
Total Bilirubin: 0.5 mg/dL (ref 0.0–1.2)
Total Protein: 6 g/dL — ABNORMAL LOW (ref 6.5–8.1)

## 2023-12-26 LAB — MAGNESIUM: Magnesium: 2.1 mg/dL (ref 1.7–2.4)

## 2023-12-26 LAB — PHOSPHORUS: Phosphorus: 4.2 mg/dL (ref 2.5–4.6)

## 2023-12-26 LAB — C-REACTIVE PROTEIN: CRP: 0.6 mg/dL (ref <1.0)

## 2023-12-26 MED ORDER — QUETIAPINE FUMARATE 25 MG PO TABS
25.0000 mg | ORAL_TABLET | Freq: Once | ORAL | Status: AC
  Administered 2023-12-26: 25 mg via ORAL
  Filled 2023-12-26: qty 1

## 2023-12-26 NOTE — Plan of Care (Signed)

## 2023-12-26 NOTE — Plan of Care (Signed)
 Discharge instructions provided to pt and daughter.    Problem: Education: Goal: Knowledge of General Education information will improve Description: Including pain rating scale, medication(s)/side effects and non-pharmacologic comfort measures Outcome: Adequate for Discharge   Problem: Health Behavior/Discharge Planning: Goal: Ability to manage health-related needs will improve Outcome: Adequate for Discharge   Problem: Clinical Measurements: Goal: Ability to maintain clinical measurements within normal limits will improve Outcome: Adequate for Discharge Goal: Will remain free from infection Outcome: Adequate for Discharge Goal: Diagnostic test results will improve Outcome: Adequate for Discharge Goal: Respiratory complications will improve Outcome: Adequate for Discharge Goal: Cardiovascular complication will be avoided Outcome: Adequate for Discharge   Problem: Activity: Goal: Risk for activity intolerance will decrease Outcome: Adequate for Discharge   Problem: Nutrition: Goal: Adequate nutrition will be maintained Outcome: Adequate for Discharge   Problem: Coping: Goal: Level of anxiety will decrease Outcome: Adequate for Discharge   Problem: Elimination: Goal: Will not experience complications related to bowel motility Outcome: Adequate for Discharge Goal: Will not experience complications related to urinary retention Outcome: Adequate for Discharge   Problem: Pain Managment: Goal: General experience of comfort will improve and/or be controlled Outcome: Adequate for Discharge   Problem: Safety: Goal: Ability to remain free from injury will improve Outcome: Adequate for Discharge   Problem: Skin Integrity: Goal: Risk for impaired skin integrity will decrease Outcome: Adequate for Discharge

## 2024-01-12 ENCOUNTER — Other Ambulatory Visit: Payer: Self-pay | Admitting: Neurology

## 2024-01-12 NOTE — Telephone Encounter (Signed)
 Requested Prescriptions   Pending Prescriptions Disp Refills   LORazepam (ATIVAN) 1 MG tablet [Pharmacy Med Name: LORAZEPAM 1 MG TABLET] 90 tablet     Sig: TAKE 1 TABLET BY MOUTH EVERY 8 HOURS AS NEEDED.   Last seen 07/20/23, next appt 02/09/24  Dispenses   Dispensed Days Supply Quantity Provider Pharmacy  LORAZEPAM 1 MG TABLET 12/13/2023 30 90 each [name], MD [facility] [phone] - S...  LORAZEPAM 1 MG TABLET 11/11/2023 30 90 each [name], MD [facility] [phone] - S...  LORAZEPAM 1 MG TABLET 10/14/2023 30 90 each [name], MD [facility] [phone] - S...  LORAZEPAM 1 MG TABLET 09/16/2023 30 90 each [name], MD [facility] [phone] - S...  LORAZEPAM 1 MG TABLET 08/17/2023 30 90 each [name], MD [facility] [phone] - S...  LORAZEPAM 1 MG TABLET 07/20/2023 30 90 each [name], MD [facility] [phone] - S...  LORAZEPAM 2 MG TABLET 06/28/2023 30 30 each [name], MD [facility] [phone] - S...  LORAZEPAM 2 MG TABLET 06/24/2023 30 15 each [name], MD [facility] [phone] - S...  LORAZEPAM 2 MG TABLET 05/26/2023 31 47 each Lory Rough., PA-C [facility] [phone] - S...  LORAZEPAM 2 MG TABLET 04/29/2023 30 15 each [name], MD [facility] [phone] - S...  LORAZEPAM 2 MG TABLET 04/12/2023 31 47 each Lory Rough., PA-C [facility] [phone] - S...  LORAZEPAM 2 MG TABLET 03/11/2023 31 47 each Lory Rough., PA-C [facility] [phone] - S...  LORAZEPAM 2 MG TABLET 02/10/2023 31 47 each Lory Rough., PA-C [facility] [phone] - S.Aaron AasAaron Aas

## 2024-01-16 ENCOUNTER — Emergency Department (HOSPITAL_COMMUNITY)

## 2024-01-16 ENCOUNTER — Other Ambulatory Visit: Payer: Self-pay

## 2024-01-16 ENCOUNTER — Encounter (HOSPITAL_COMMUNITY): Payer: Self-pay | Admitting: Emergency Medicine

## 2024-01-16 ENCOUNTER — Emergency Department (HOSPITAL_COMMUNITY)
Admission: EM | Admit: 2024-01-16 | Discharge: 2024-01-17 | Disposition: A | Discharge status: Home / Self Care | Attending: Emergency Medicine | Admitting: Emergency Medicine

## 2024-01-16 DIAGNOSIS — I10 Essential (primary) hypertension: Secondary | ICD-10-CM | POA: Diagnosis not present

## 2024-01-16 DIAGNOSIS — R531 Weakness: Secondary | ICD-10-CM | POA: Diagnosis not present

## 2024-01-16 DIAGNOSIS — N3000 Acute cystitis without hematuria: Secondary | ICD-10-CM | POA: Diagnosis not present

## 2024-01-16 DIAGNOSIS — Z7982 Long term (current) use of aspirin: Secondary | ICD-10-CM | POA: Insufficient documentation

## 2024-01-16 DIAGNOSIS — R5383 Other fatigue: Secondary | ICD-10-CM | POA: Diagnosis present

## 2024-01-16 LAB — COMPREHENSIVE METABOLIC PANEL WITH GFR
ALT: 9 U/L (ref 0–44)
AST: 22 U/L (ref 15–41)
Albumin: 3.3 g/dL — ABNORMAL LOW (ref 3.5–5.0)
Alkaline Phosphatase: 72 U/L (ref 38–126)
Anion gap: 11 (ref 5–15)
BUN: 19 mg/dL (ref 8–23)
CO2: 19 mmol/L — ABNORMAL LOW (ref 22–32)
Calcium: 8.5 mg/dL — ABNORMAL LOW (ref 8.9–10.3)
Chloride: 109 mmol/L (ref 98–111)
Creatinine, Ser: 0.9 mg/dL (ref 0.44–1.00)
GFR, Estimated: >60 mL/min (ref >60)
Glucose, Bld: 137 mg/dL — ABNORMAL HIGH (ref 70–99)
Potassium: 3.4 mmol/L — ABNORMAL LOW (ref 3.5–5.1)
Sodium: 139 mmol/L (ref 135–145)
Total Bilirubin: 0.9 mg/dL (ref 0.0–1.2)
Total Protein: 5.8 g/dL — ABNORMAL LOW (ref 6.5–8.1)

## 2024-01-16 LAB — CBC
HCT: 32 % — ABNORMAL LOW (ref 36.0–46.0)
Hemoglobin: 10.5 g/dL — ABNORMAL LOW (ref 12.0–15.0)
MCH: 32.7 pg (ref 26.0–34.0)
MCHC: 32.8 g/dL (ref 30.0–36.0)
MCV: 99.7 fL (ref 80.0–100.0)
Platelets: 179 10*3/uL (ref 150–400)
RBC: 3.21 MIL/uL — ABNORMAL LOW (ref 3.87–5.11)
RDW: 12.1 % (ref 11.5–15.5)
WBC: 6.8 10*3/uL (ref 4.0–10.5)
nRBC: 0 % (ref 0.0–0.2)

## 2024-01-16 LAB — URINALYSIS, ROUTINE W REFLEX MICROSCOPIC
Bilirubin Urine: NEGATIVE
Glucose, UA: NEGATIVE mg/dL
Ketones, ur: NEGATIVE mg/dL
Nitrite: NEGATIVE
Protein, ur: 30 mg/dL — AB
Specific Gravity, Urine: 1.026 (ref 1.005–1.030)
pH: 5 (ref 5.0–8.0)

## 2024-01-16 LAB — TROPONIN I (HIGH SENSITIVITY)
Troponin I (High Sensitivity): 5 ng/L (ref <18)
Troponin I (High Sensitivity): 7 ng/L (ref <18)

## 2024-01-16 LAB — CBG MONITORING, ED: Glucose-Capillary: 135 mg/dL — ABNORMAL HIGH (ref 70–99)

## 2024-01-16 LAB — LIPASE, BLOOD: Lipase: 32 U/L (ref 11–51)

## 2024-01-16 MED ORDER — CEPHALEXIN 250 MG PO CAPS
500.0000 mg | ORAL_CAPSULE | Freq: Once | ORAL | Status: AC
  Administered 2024-01-16: 500 mg via ORAL
  Filled 2024-01-16: qty 2

## 2024-01-16 MED ORDER — CEPHALEXIN 500 MG PO CAPS: 500.0000 mg | ORAL_CAPSULE | Freq: Two times a day (BID) | ORAL | 0 refills | Status: AC

## 2024-01-16 NOTE — ED Provider Notes (Addendum)
 [facility] Provider Note   CSN: [id number] Arrival date & time: 01/16/24  1900     History  Chief Complaint  Patient presents with   Altered Mental Status    [name] is a 83 y.o. female.  Patient here with some general fatigue.  History of hypertension high cholesterol depression chronic UTI.  Possibly for another UTI.  She denies any fevers or chills.  No falls no headache no weakness numbness tingling.  Patient herself feels like she is feeling much better.  She was diagnosed with urinary tract infection 2 weeks ago.  She denies any chest pain shortness of breath abdominal pain nausea vomiting diarrhea.  When I talk to family they did mention she maybe had some epigastric pain a couple days ago not having that now but that will come and go.  She will have headaches daily.  But not have a headache now.  The history is provided by the patient.       Home Medications Prior to Admission medications   Medication Sig Start Date End Date Taking? Authorizing Provider  cephALEXin  (KEFLEX ) 500 MG capsule Take 1 capsule (500 mg total) by mouth 2 (two) times daily for 5 days. 01/16/24 01/21/24 Yes Nancylee Gaines, DO  acetaminophen  (TYLENOL ) 500 MG tablet Take 500 mg by mouth every 6 (six) hours as needed for moderate pain.    [provider]  aspirin  81 MG tablet Take 81 mg by mouth in the morning.    [provider]  calcium  carbonate (TUMS - DOSED IN MG ELEMENTAL CALCIUM ) 500 MG chewable tablet Chew 1 tablet by mouth as needed for indigestion or heartburn.    [provider]  Cholecalciferol  (VITAMIN D ) 125 MCG (5000 UT) CAPS Take 5,000 Units by mouth daily.    [provider]  doxazosin  (CARDURA ) 8 MG tablet Take 8 mg by mouth daily.    [provider]  ferrous sulfate  324 MG TBEC Take 324 mg by mouth in the morning.    [provider]  fluticasone (FLONASE) 50 MCG/ACT nasal spray  Place 1 spray into both nostrils daily as needed for allergies or rhinitis.    [provider]  gabapentin  (NEURONTIN ) 100 MG capsule Take 100 mg by mouth 2 (two) times daily.    [provider]  isosorbide  mononitrate (IMDUR ) 60 MG 24 hr tablet Take 60 mg by mouth daily.    [provider]  levothyroxine  (SYNTHROID ) 75 MCG tablet Take 75 mcg by mouth in the morning. 12/18/20   [provider]  LORazepam  (ATIVAN ) 1 MG tablet TAKE 1 TABLET BY MOUTH EVERY 8 HOURS AS NEEDED. 01/12/24   [name], MD  Multiple Vitamin (MULTIVITAMIN) tablet Take 1 tablet by mouth every morning.    [provider]  ondansetron  (ZOFRAN -ODT) 8 MG disintegrating tablet Take 1 tablet (8 mg total) by mouth every 8 (eight) hours as needed for nausea or vomiting. 06/11/22   [name], MD  QUEtiapine  (SEROQUEL ) 25 MG tablet TAKE 1 TABLET BY MOUTH EACH MORNING, 1 TABLET IN THE AFTERNOON , AND 3 TABLETS AT BEDTIME Patient taking differently: Take 25-75 mg by mouth See admin instructions. TAKE 1 TABLET BY MOUTH EACH MORNING, 1 TABLET IN THE AFTERNOON , AND 3 TABLETS AT BEDTIME 11/25/23   [name], MD  sertraline  (ZOLOFT ) 100 MG tablet Take 200 mg by mouth in the morning.    [provider]  simvastatin  (ZOCOR ) 40 MG  tablet Take 40 mg by mouth at bedtime. 10/31/20   [provider]  trimethoprim (TRIMPEX) 100 MG tablet Take 1 tablet by mouth at bedtime. 11/24/23 02/22/24  [provider]      Allergies    Nsaids    Review of Systems   Review of Systems  Physical Exam Updated Vital Signs BP 136/75   Pulse 67   Temp 98.4 F (36.9 C) (Oral)   Resp 13   Ht 5\' 4"  (1.626 m)   Wt 54.9 kg   SpO2 97%   BMI 20.78 kg/m  Physical Exam Vitals and nursing note reviewed.  Constitutional:      General: She is not in acute distress.    Appearance: She is well-developed. She is not ill-appearing.  HENT:     Head: Normocephalic and atraumatic.     Nose: Nose  normal.     Mouth/Throat:     Mouth: Mucous membranes are moist.  Eyes:     Extraocular Movements: Extraocular movements intact.     Conjunctiva/sclera: Conjunctivae normal.     Pupils: Pupils are equal, round, and reactive to light.  Cardiovascular:     Rate and Rhythm: Normal rate and regular rhythm.     Pulses: Normal pulses.     Heart sounds: Normal heart sounds. No murmur heard. Pulmonary:     Effort: Pulmonary effort is normal. No respiratory distress.     Breath sounds: Normal breath sounds.  Abdominal:     General: Abdomen is flat.     Palpations: Abdomen is soft.     Tenderness: There is no abdominal tenderness.  Musculoskeletal:        General: No swelling.     Cervical back: Normal range of motion and neck supple.  Skin:    General: Skin is warm and dry.     Capillary Refill: Capillary refill takes less than 2 seconds.  Neurological:     General: No focal deficit present.     Mental Status: She is alert and oriented to person, place, and time.     Cranial Nerves: No cranial nerve deficit.     Sensory: No sensory deficit.     Motor: No weakness.     Coordination: Coordination normal.  Psychiatric:        Mood and Affect: Mood normal.     ED Results / Procedures / Treatments   Labs (all labs ordered are listed, but only abnormal results are displayed) Labs Reviewed  COMPREHENSIVE METABOLIC PANEL WITH GFR - Abnormal; Notable for the following components:      Result Value   Potassium 3.4 (*)    CO2 19 (*)    Glucose, Bld 137 (*)    Calcium  8.5 (*)    Total Protein 5.8 (*)    Albumin 3.3 (*)    All other components within normal limits  CBC - Abnormal; Notable for the following components:   RBC 3.21 (*)    Hemoglobin 10.5 (*)    HCT 32.0 (*)    All other components within normal limits  URINALYSIS, ROUTINE W REFLEX MICROSCOPIC - Abnormal; Notable for the following components:   APPearance HAZY (*)    Hgb urine dipstick SMALL (*)    Protein, ur 30 (*)     Leukocytes,Ua MODERATE (*)    Bacteria, UA RARE (*)    All other components within normal limits  CBG MONITORING, ED - Abnormal; Notable for the following components:   Glucose-Capillary 135 (*)  All other components within normal limits  URINE CULTURE  LIPASE, BLOOD  TROPONIN I (HIGH SENSITIVITY)  TROPONIN I (HIGH SENSITIVITY)    EKG EKG Interpretation Date/Time:  Sunday January 16 2024 22:44:40 EDT Ventricular Rate:  68 PR Interval:  166 QRS Duration:  87 QT Interval:  462 QTC Calculation: 492 R Axis:   -29  Text Interpretation: Sinus rhythm Borderline left axis deviation Borderline T wave abnormalities Borderline prolonged QT interval Confirmed by Lowery Rue [phone]) on 01/16/2024 10:46:01 PM  Radiology CT HEAD WO CONTRAST ( ) Result Date: 01/16/2024 CLINICAL DATA:  Altered mental status and weakness. EXAM: CT HEAD WITHOUT CONTRAST TECHNIQUE: Contiguous axial images were obtained from the base of the skull through the vertex without intravenous contrast. RADIATION DOSE REDUCTION: This exam was performed according to the departmental dose-optimization program which includes automated exposure control, adjustment of the mA and/or kV according to patient size and/or use of iterative reconstruction technique. COMPARISON:  December 18, 2023 FINDINGS: Brain: There is generalized cerebral atrophy with widening of the extra-axial spaces and ventricular dilatation. There are areas of decreased attenuation within the white matter tracts of the supratentorial brain, consistent with microvascular disease changes. Vascular: No hyperdense vessel or unexpected calcification. Skull: Normal. Negative for fracture or focal lesion. Sinuses/Orbits: No acute finding. Other: None. IMPRESSION: 1. Generalized cerebral atrophy with chronic white matter small vessel ischemic changes. 2. No acute intracranial abnormality. Electronically Signed   By: Virgle Grime M.D.   On: 01/16/2024 21:44   DG Chest  Portable 1 View Result Date: 01/16/2024 CLINICAL DATA:  Altered mental status. EXAM: PORTABLE CHEST 1 VIEW COMPARISON:  December 18, 2023 FINDINGS: The cardiac silhouette is mildly enlarged and unchanged in size. There is marked severity calcification of the thoracic aorta. Mild, diffuse, chronic appearing increased interstitial lung markings are noted. There is no evidence of an acute infiltrate, pleural effusion or pneumothorax. A large hiatal hernia is seen. Stable spinal stimulator wire positioning is noted. The visualized skeletal structures are unremarkable. IMPRESSION: 1. Stable exam without evidence of acute or active cardiopulmonary disease. Electronically Signed   By: Virgle Grime M.D.   On: 01/16/2024 21:42    Procedures Procedures    Medications Ordered in ED Medications  cephALEXin  (KEFLEX ) capsule 500 mg (has no administration in time range)    ED Course/ Medical Decision Making/ A&P                                 Medical Decision Making Amount and/or Complexity of Data Reviewed Labs: ordered. Radiology: ordered.  Risk Prescription drug management.   [name] is here with some generalized weakness.  She has no fever normal vitals.  Just treated for UTI.  Family concerned maybe for another UTI.  She has been complaining of some upper abdominal pain at times some headaches here recently but she does not have those complaints today.  She denies any cough sputum production.  She actually feeling much better on my evaluation than she did earlier today.  Is not having any chest pain shortness of breath.  She denies any trauma or falls.  Ultimately we will try to rule out urine infection pneumonia will check basic labs to evaluate for any issues with her pancreas her heart or lungs.  Will check CBC CMP lipase troponin EKG chest x-ray urinalysis head CT,trop.  But overall she does appear well.  Have no concern for stroke.  Lab work thus far unremarkable.  No significant  leukocytosis anemia or electrolyte abnormality.  Troponin normal.  Pace normal.  EKG shows sinus rhythm.  No ischemic changes.  Head CT unremarkable.  Chest x-ray with no evidence of pneumonia pneumothorax.    Repeat troponins flat.  No concern for ACS.  Urinalysis possibly with infection.  Will treat with Keflex .  Will send urine culture.  I have no concern for sepsis.  Given her first antibiotic here in the ED.  Understands return precautions.  Discharge.   This chart was dictated using voice recognition software.  Despite best efforts to proofread,  errors can occur which can change the documentation meaning.      Final Clinical Impression(s) / ED Diagnoses Final diagnoses:  Weakness  Acute cystitis without hematuria    Rx / DC Orders ED Discharge Orders          Ordered    cephALEXin  (KEFLEX ) 500 MG capsule  2 times daily        01/16/24 2326              Lowery Rue, DO 01/16/24 2232    Lowery Rue, DO 01/16/24 2327

## 2024-01-16 NOTE — ED Triage Notes (Signed)
 Pt BIB EMS from home with c/o AMS weakness, and agitation. Diagnosed with UTI x 2 weeks ago, family states that she was better until today when symptoms started.

## 2024-01-16 NOTE — Discharge Instructions (Signed)
 Take next dose of antibiotic tomorrow morning.  Follow-up with your primary care doctor.  Return if symptoms worsen.

## 2024-01-18 LAB — URINE CULTURE: Culture: NO GROWTH

## 2024-01-23 ENCOUNTER — Other Ambulatory Visit: Payer: Self-pay

## 2024-01-23 ENCOUNTER — Encounter (HOSPITAL_COMMUNITY): Payer: Self-pay

## 2024-01-23 ENCOUNTER — Emergency Department (HOSPITAL_COMMUNITY)

## 2024-01-23 ENCOUNTER — Emergency Department (HOSPITAL_COMMUNITY)
Admission: EM | Admit: 2024-01-23 | Discharge: 2024-01-23 | Disposition: A | Discharge status: Home / Self Care | Attending: Emergency Medicine | Admitting: Emergency Medicine

## 2024-01-23 DIAGNOSIS — Z96642 Presence of left artificial hip joint: Secondary | ICD-10-CM | POA: Diagnosis not present

## 2024-01-23 DIAGNOSIS — I129 Hypertensive chronic kidney disease with stage 1 through stage 4 chronic kidney disease, or unspecified chronic kidney disease: Secondary | ICD-10-CM | POA: Insufficient documentation

## 2024-01-23 DIAGNOSIS — R079 Chest pain, unspecified: Secondary | ICD-10-CM

## 2024-01-23 DIAGNOSIS — I2510 Atherosclerotic heart disease of native coronary artery without angina pectoris: Secondary | ICD-10-CM | POA: Diagnosis not present

## 2024-01-23 DIAGNOSIS — K449 Diaphragmatic hernia without obstruction or gangrene: Secondary | ICD-10-CM | POA: Diagnosis not present

## 2024-01-23 DIAGNOSIS — F0390 Unspecified dementia without behavioral disturbance: Secondary | ICD-10-CM | POA: Diagnosis not present

## 2024-01-23 DIAGNOSIS — R1013 Epigastric pain: Secondary | ICD-10-CM | POA: Diagnosis present

## 2024-01-23 DIAGNOSIS — K5900 Constipation, unspecified: Secondary | ICD-10-CM

## 2024-01-23 DIAGNOSIS — N189 Chronic kidney disease, unspecified: Secondary | ICD-10-CM | POA: Diagnosis not present

## 2024-01-23 DIAGNOSIS — D1803 Hemangioma of intra-abdominal structures: Secondary | ICD-10-CM

## 2024-01-23 DIAGNOSIS — Z7982 Long term (current) use of aspirin: Secondary | ICD-10-CM | POA: Insufficient documentation

## 2024-01-23 LAB — COMPREHENSIVE METABOLIC PANEL WITH GFR
ALT: 11 U/L (ref 0–44)
AST: 23 U/L (ref 15–41)
Albumin: 3.7 g/dL (ref 3.5–5.0)
Alkaline Phosphatase: 82 U/L (ref 38–126)
Anion gap: 10 (ref 5–15)
BUN: 12 mg/dL (ref 8–23)
CO2: 24 mmol/L (ref 22–32)
Calcium: 8.9 mg/dL (ref 8.9–10.3)
Chloride: 104 mmol/L (ref 98–111)
Creatinine, Ser: 0.93 mg/dL (ref 0.44–1.00)
GFR, Estimated: >60 mL/min (ref >60)
Glucose, Bld: 95 mg/dL (ref 70–99)
Potassium: 3.9 mmol/L (ref 3.5–5.1)
Sodium: 138 mmol/L (ref 135–145)
Total Bilirubin: 0.9 mg/dL (ref 0.0–1.2)
Total Protein: 6.2 g/dL — ABNORMAL LOW (ref 6.5–8.1)

## 2024-01-23 LAB — URINALYSIS, ROUTINE W REFLEX MICROSCOPIC
Bacteria, UA: NONE SEEN
Bilirubin Urine: NEGATIVE
Glucose, UA: NEGATIVE mg/dL
Hgb urine dipstick: NEGATIVE
Ketones, ur: NEGATIVE mg/dL
Nitrite: NEGATIVE
Protein, ur: NEGATIVE mg/dL
Specific Gravity, Urine: 1.008 (ref 1.005–1.030)
pH: 7 (ref 5.0–8.0)

## 2024-01-23 LAB — CBC
HCT: 37.9 % (ref 36.0–46.0)
Hemoglobin: 12.3 g/dL (ref 12.0–15.0)
MCH: 32 pg (ref 26.0–34.0)
MCHC: 32.5 g/dL (ref 30.0–36.0)
MCV: 98.7 fL (ref 80.0–100.0)
Platelets: 169 10*3/uL (ref 150–400)
RBC: 3.84 MIL/uL — ABNORMAL LOW (ref 3.87–5.11)
RDW: 12.4 % (ref 11.5–15.5)
WBC: 5.5 10*3/uL (ref 4.0–10.5)
nRBC: 0 % (ref 0.0–0.2)

## 2024-01-23 LAB — LIPASE, BLOOD: Lipase: 24 U/L (ref 11–51)

## 2024-01-23 LAB — RESP PANEL BY RT-PCR (RSV, FLU A&B, COVID)  RVPGX2
Influenza A by PCR: NEGATIVE
Influenza B by PCR: NEGATIVE
Resp Syncytial Virus by PCR: NEGATIVE
SARS Coronavirus 2 by RT PCR: NEGATIVE

## 2024-01-23 LAB — TROPONIN I (HIGH SENSITIVITY)
Troponin I (High Sensitivity): 4 ng/L (ref <18)
Troponin I (High Sensitivity): 5 ng/L (ref <18)

## 2024-01-23 MED ORDER — IOHEXOL 350 MG/ML SOLN
75.0000 mL | Freq: Once | INTRAVENOUS | Status: AC | PRN
Start: 2024-01-23 — End: 2024-01-23
  Administered 2024-01-23: 75 mL via INTRAVENOUS

## 2024-01-23 MED ORDER — ALUM & MAG HYDROXIDE-SIMETH 200-200-20 MG/5ML PO SUSP
30.0000 mL | Freq: Once | ORAL | Status: AC
  Administered 2024-01-23: 30 mL via ORAL
  Filled 2024-01-23: qty 30

## 2024-01-23 NOTE — ED Notes (Signed)
 Pt/family understood d/c instructions and when to return to the ED. Follow up appointments were reviewed and TOC medications. Pt left via wheelchair to go home w/ family. IV d/c and VS retaken

## 2024-01-23 NOTE — Discharge Instructions (Addendum)
 Please follow up with your primary care doctor within 2-3 days. For constipation we also recommend a diet high in fiber (beans, fruits, vegetables, whole grains). Take Colace 100-200 mg up to three times per day. You may take along with Senokot 1-2 tabs, ingest with full glass of water.  You may also take MiraLAX  1-2 capfuls 1-2 times a day until stools become soft and then slowly decrease the amount of MiraLAX  used.  Maintain fluid intake 6-8 glasses per day. Please increase fibers in your diet. You may also take Milk of Magnesia 30 mL as needed for constipation, you may repeat in 2 hours again if no bowl movement.   It was a pleasure caring for you today in the emergency department.  Please follow up with your pcp and cardiologist  Please return to the emergency department for any worsening or worrisome symptoms.

## 2024-01-23 NOTE — ED Provider Notes (Signed)
 [place] EMERGENCY DEPARTMENT AT [facility] Provider Note  CSN: [id number] Arrival date & time: 01/23/24 1884  Chief Complaint(s) Chest Pain  HPI [name] is a 83 y.o. female with past medical history as below, significant for dementia, CKD, CAD, HLD, HTN who presents to the ED with complaint of upper abdominal pain, lower chest pain over the past month  Reports was having epigastric pain this morning, left upper quadrant abdominal pain.  EMS gave her aspirin  and nitroglycerin.  Symptoms seem to have improved.  She is currently symptomatic.  Reports the symptoms been ongoing for over a month.  Oftentimes improves with eating.  No nausea or vomiting.  No change in bowel or bladder function.  No BRBPR or melena.  No dyspnea.  No falls or injuries.  She was seen in the ER a week ago for UTI, weakness.  She was diagnosed with UTI, she did report some intermittent epigastric pain but was asymptomatic at that time.  Level 5 caveat, dementia.  Here with family members to corroborate story  Past Medical History Past Medical History:  Diagnosis Date   Arthritis    Chest pain    Chronic back pain    "neurostimulator electrode leads overlie the lower thoracic spinal canal" - per cxr report 07/10/14   Chronic kidney disease    kidney stone   Chronic UTI (urinary tract infection)    Coronary artery disease    NONOBSTRUCTIVE   Depression    Dizziness    GERD (gastroesophageal reflux disease)    Headache    History of kidney stones    Hyperlipidemia    Hypertension    Left foot drop    SINCE 2009 - RELATED TO BACK PROBLEM - WEARS BRACE   PONV (postoperative nausea and vomiting)    Spinal stenosis    Thyroid disease    hypothryoid   Patient Active Problem List   Diagnosis Date Noted   Delirium 12/21/2023   Altered mental status 06/30/2022   Aggressive behavior 01/27/2021   Dementia with behavioral disturbance (HCC) 01/14/2021   Memory loss 09/17/2020   Small  vessel disease, 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 Authorizing Provider  acetaminophen  (TYLENOL ) 500 MG tablet Take 500 mg by mouth every 6 (six) hours as needed for moderate pain.    [provider]  aspirin  81 MG tablet Take 81 mg by mouth in the morning.    [provider]  calcium  carbonate (TUMS - DOSED IN MG ELEMENTAL CALCIUM ) 500 MG chewable tablet Chew 1 tablet by mouth as needed for indigestion or heartburn.    [provider]  Cholecalciferol  (VITAMIN D ) 125 MCG (5000 UT) CAPS Take 5,000 Units by mouth daily.    [provider]  doxazosin  (CARDURA ) 8 MG tablet Take 8 mg by mouth daily.    [provider]  ferrous sulfate  324 MG TBEC Take 324 mg by mouth in the morning.    [provider]  fluticasone (FLONASE) 50 MCG/ACT nasal spray Place 1 spray into both nostrils daily as needed for allergies or rhinitis.    [provider]  gabapentin  (NEURONTIN ) 100 MG capsule  Take 100 mg by mouth 2 (two) times daily.    [provider]  isosorbide  mononitrate (IMDUR ) 60 MG 24 hr tablet Take 60 mg by mouth daily.    [provider]  levothyroxine  (SYNTHROID ) 75 MCG tablet Take 75 mcg by mouth in the morning. 12/18/20   [provider]  LORazepam  (ATIVAN ) 1 MG tablet TAKE 1 TABLET BY MOUTH EVERY 8 HOURS AS NEEDED. 01/12/24   [name], MD  Multiple Vitamin (MULTIVITAMIN) tablet Take 1 tablet by mouth every morning.    [provider]  ondansetron  (ZOFRAN -ODT) 8 MG disintegrating tablet Take 1 tablet (8 mg total) by mouth every 8 (eight) hours as needed for nausea or vomiting. 06/11/22   [name], MD   QUEtiapine  (SEROQUEL ) 25 MG tablet TAKE 1 TABLET BY MOUTH EACH MORNING, 1 TABLET IN THE AFTERNOON , AND 3 TABLETS AT BEDTIME Patient taking differently: Take 25-75 mg by mouth See admin instructions. TAKE 1 TABLET BY MOUTH EACH MORNING, 1 TABLET IN THE AFTERNOON , AND 3 TABLETS AT BEDTIME 11/25/23   [name], MD  sertraline  (ZOLOFT ) 100 MG tablet Take 200 mg by mouth in the morning.    [provider]  simvastatin  (ZOCOR ) 40 MG tablet Take 40 mg by mouth at bedtime. 10/31/20   [provider]  trimethoprim (TRIMPEX) 100 MG tablet Take 1 tablet by mouth at bedtime. 11/24/23 02/22/24  [provider]                                                                                                                                    Past Surgical History Past Surgical History:  Procedure Laterality Date   BACK SURGERY     CARDIAC CATHETERIZATION  02/22/2009   EF 60%   CATARACT EXTRACTION     both eyes   COLONOSCOPY     CYSTOSCOPY     FOR KIDNEY STONE   LUMBAR LAMINECTOMY/DECOMPRESSION MICRODISCECTOMY     TOTAL HIP ARTHROPLASTY Left 07/17/2014   Procedure: LEFT TOTAL HIP ARTHROPLASTY ANTERIOR APPROACH;  Surgeon: Bevin Bucks, MD;  Location: [facility];  Service: Orthopedics;  Laterality: Left;   Family History Family History  Problem Relation Age of Onset   Cancer Mother        started in gallbladder then spread to liver   Heart attack Father    Colon cancer Neg Hx    Esophageal cancer Neg Hx    Rectal cancer Neg Hx    Stomach cancer Neg Hx     Social History Social History   Tobacco Use   Smoking status: Never   Smokeless tobacco: Never  Vaping Use   Vaping status: Never Used  Substance Use Topics   Alcohol use: No    Alcohol/week: 0.0 standard drinks of alcohol   Drug use: No   Allergies Nsaids  Review of Systems A thorough review of systems was obtained and all  systems are negative except as noted in the HPI and PMH.   Physical Exam Vital Signs   I have reviewed the triage vital signs BP 127/64   Pulse 64   Temp 98 F (36.7 C)   Resp 12   Ht 5\' 4"  (1.626 m)   Wt 54 kg   SpO2 100%   BMI 20.43 kg/m  Physical Exam Vitals and nursing note reviewed.  Constitutional:      General: She is not in acute distress.    Appearance: Normal appearance.     Comments: Frail  HENT:     Head: Normocephalic and atraumatic.     Right Ear: External ear normal.     Left Ear: External ear normal.     Nose: Nose normal.     Mouth/Throat:     Mouth: Mucous membranes are moist.  Eyes:     General: No scleral icterus.       Right eye: No discharge.        Left eye: No discharge.  Cardiovascular:     Rate and Rhythm: Normal rate and regular rhythm.     Pulses: Normal pulses.     Heart sounds: Normal heart sounds.  Pulmonary:     Effort: Pulmonary effort is normal. No respiratory distress.     Breath sounds: Normal breath sounds. No stridor.  Abdominal:     General: Abdomen is flat. There is no distension.     Palpations: Abdomen is soft.     Tenderness: There is abdominal tenderness.       Comments: Nonperitoneal abdomen  Musculoskeletal:     Cervical back: No rigidity.     Right lower leg: No edema.     Left lower leg: No edema.  Skin:    General: Skin is warm and dry.     Capillary Refill: Capillary refill takes less than 2 seconds.  Neurological:     Mental Status: She is alert.  Psychiatric:        Mood and Affect: Mood normal.        Behavior: Behavior normal. Behavior is cooperative.     ED Results and Treatments Labs (all labs ordered are listed, but only abnormal results are displayed) Labs Reviewed  COMPREHENSIVE METABOLIC PANEL WITH GFR - Abnormal; Notable for the following components:      Result Value   Total Protein 6.2 (*)    All other components within normal limits  CBC - Abnormal; Notable for the following components:   RBC 3.84 (*)    All other components within normal limits  URINALYSIS, ROUTINE W  REFLEX MICROSCOPIC - Abnormal; Notable for the following components:   Color, Urine STRAW (*)    Leukocytes,Ua TRACE (*)    All other components within normal limits  RESP PANEL BY RT-PCR (RSV, FLU A&B, COVID)  RVPGX2  LIPASE, BLOOD  TROPONIN I (HIGH SENSITIVITY)  TROPONIN I (HIGH SENSITIVITY)  Radiology CT ABDOMEN PELVIS W CONTRAST Result Date: 01/23/2024 CLINICAL DATA:  Epigastric pain EXAM: CT ABDOMEN AND PELVIS WITH CONTRAST TECHNIQUE: Multidetector CT imaging of the abdomen and pelvis was performed using the standard protocol following bolus administration of intravenous contrast. RADIATION DOSE REDUCTION: This exam was performed according to the departmental dose-optimization program which includes automated exposure control, adjustment of the mA and/or kV according to patient size and/or use of iterative reconstruction technique. CONTRAST:  75mL OMNIPAQUE  IOHEXOL  350 MG/ML SOLN COMPARISON:  12/21/2023 FINDINGS: Lower chest: No acute pleural or parenchymal lung disease. Stable large hiatal hernia with the majority of the stomach in an intrathoracic location. Hepatobiliary: 2.2 x 1.5 cm indeterminate hypodensity left lobe liver reference image 11/3. Stable hemangioma within the caudate. A biliary duct dilation. The gallbladder is unremarkable. Pancreas: Unremarkable. No pancreatic ductal dilatation or surrounding inflammatory changes. Spleen: Normal in size without focal abnormality. Adrenals/Urinary Tract: Stable 8 mm nonobstructing left renal calculus. No right-sided calculi. No obstructive uropathy within either kidney. The adrenals and bladder are unremarkable. Stomach/Bowel: No bowel obstruction or ileus. Moderate retained stool throughout the colon. No bowel wall thickening or inflammatory change. Large hiatal hernia as described above. Vascular/Lymphatic: Aortic  atherosclerosis. No enlarged abdominal or pelvic lymph nodes. Reproductive: Uterus and bilateral adnexa are unremarkable. Other: No free fluid or free intraperitoneal gas. No abdominal wall hernia. Musculoskeletal: Left hip arthroplasty. Stable spinal stimulator. No acute or destructive bony abnormalities. Reconstructed images demonstrate no additional findings. IMPRESSION: 1. Large hiatal hernia with the majority of the stomach in an intrathoracic location, stable. 2. Stable nonobstructing 8 mm left renal calculus. 3. Moderate retained stool throughout the colon consistent with constipation. No bowel obstruction or ileus. 4. Stable indeterminate hypodensity left lobe liver and hemangioma within the central left lobe/caudate region. 5.  Aortic Atherosclerosis (ICD10-I70.0). Electronically Signed   By: Bobbye Burrow M.D.   On: 01/23/2024 14:54   DG Chest 2 View Result Date: 01/23/2024 CLINICAL DATA:  Chest pain beginning at 8 a.m. today. EXAM: CHEST - 2 VIEW COMPARISON:  One-view chest x-ray 01/16/2024. FINDINGS: The heart is mildly enlarged. Atherosclerotic changes are present at the aortic arch. A large hiatal hernia is again noted. Lungs are clear. No effusion or pneumothorax is present. Dorsal spinal cord stimulator is present. Exaggerated thoracic kyphosis is centered about the T9 fracture. IMPRESSION: 1. Mild cardiomegaly without failure. 2. Large hiatal hernia. 3. No acute cardiopulmonary disease. Electronically Signed   By: Audree Leas M.D.   On: 01/23/2024 11:26    Pertinent labs & imaging results that were available during my care of the patient were reviewed by me and considered in my medical decision making (see MDM for details).  Medications Ordered in ED Medications  alum & mag hydroxide-simeth (MAALOX/MYLANTA) 200-200-20 MG/5ML suspension 30 mL (30 mLs Oral Given 01/23/24 1107)  iohexol  (OMNIPAQUE ) 350 MG/ML injection 75 mL (75 mLs Intravenous Contrast Given 01/23/24 1401)  Procedures Procedures  (including critical care time)  Medical Decision Making / ED Course    Medical Decision Making:    [name] is a 83 y.o. female with past medical history as below, significant for dementia, CKD, CAD, HLD, HTN who presents to the ED with complaint of upper abdominal pain, lower chest pain over the past month. The complaint involves an extensive differential diagnosis and also carries with it a high risk of complications and morbidity.  Serious etiology was considered. Ddx includes but is not limited to: Differential diagnosis includes but is not exclusive to acute cholecystitis, intrathoracic causes for epigastric abdominal pain, gastritis, duodenitis, pancreatitis, small bowel or large bowel obstruction, abdominal aortic aneurysm, hernia, gastritis, etc. Differential includes all life-threatening causes for chest pain. This includes but is not exclusive to acute coronary syndrome, aortic dissection, pulmonary embolism, cardiac tamponade, community-acquired pneumonia, pericarditis, musculoskeletal chest wall pain, etc.   Complete initial physical exam performed, notably the patient was in no distress, no hypoxia, she is HDS.    Reviewed and confirmed nursing documentation for past medical history, family history, social history.  Vital signs reviewed.     Brief summary:  83 year old female history above including dementia, here with upper abdominal pain, lower chest pain for over a month.  Currently asymptomatic. Intermittent pain over the past month, improved with p.o. intake.  No evidence of bleeding reported.  Will check screening labs, get CT abdomen pelvis, chest x-ray, reassess.  Clinical Course as of 01/23/24 1547  Sun Jan 23, 2024  1534 Symptoms resolved, tolerating po. Abd soft/nontender. NSR on telemetry  [SG]  1534 CT w/ hiatal  hernia, likely hemangioma, constipation. [SG]    Clinical Course User Index [SG] Teddi Favors, DO    The patient's chest pain is not suggestive of pulmonary embolus, cardiac ischemia, aortic dissection, pericarditis, myocarditis, pulmonary embolism, pneumothorax, pneumonia, Zoster, or esophageal perforation, or other serious etiology.  Historically not abrupt in onset, tearing or ripping, pulses symmetric. EKG nonspecific for ischemia/infarction. No dysrhythmias, brugada, WPW, prolonged QT noted.   Troponin negative x2. CXR reviewed. Labs without demonstration of acute pathology unless otherwise noted above. HEART Score is 4. Pain seems atypical, likely 2/2 GI source but she does have elev heart score, recommend f/u cardiology.   Constipation instructions provided  Given the extremely low risk of these diagnoses further testing and evaluation for these possibilities does not appear to be indicated at this time. Patient in no distress and overall condition improved here in the ED. Detailed discussions were had with the patient regarding current findings, and need for close f/u with PCP or on call doctor. The patient has been instructed to return immediately if the symptoms worsen in any way for re-evaluation. Patient verbalized understanding and is in agreement with current care plan. All questions answered prior to discharge.               Additional history obtained: -Additional history obtained from family -External records from outside source obtained and reviewed including: Chart review including previous notes, labs, imaging, consultation notes including  Home meds Primary care documentation Prior labs   Lab Tests: -I ordered, reviewed, and interpreted labs.   The pertinent results include:   Labs Reviewed  COMPREHENSIVE METABOLIC PANEL WITH GFR - Abnormal; Notable for the following components:      Result Value   Total Protein 6.2 (*)    All other components within  normal limits  CBC - Abnormal; Notable for the following  components:   RBC 3.84 (*)    All other components within normal limits  URINALYSIS, ROUTINE W REFLEX MICROSCOPIC - Abnormal; Notable for the following components:   Color, Urine STRAW (*)    Leukocytes,Ua TRACE (*)    All other components within normal limits  RESP PANEL BY RT-PCR (RSV, FLU A&B, COVID)  RVPGX2  LIPASE, BLOOD  TROPONIN I (HIGH SENSITIVITY)  TROPONIN I (HIGH SENSITIVITY)    Notable for labs stable  EKG   EKG Interpretation Date/Time:  Sunday January 23 2024 10:18:16 EDT Ventricular Rate:  70 PR Interval:  160 QRS Duration:  82 QT Interval:  518 QTC Calculation: 560 R Axis:   -32  Text Interpretation: Sinus rhythm Left axis deviation Abnormal R-wave progression, early transition Borderline T wave abnormalities Prolonged QT interval no stemi similar prior Confirmed by Russella Courts ([id number]) on 01/23/2024 10:48:04 AM         Imaging Studies ordered: I ordered imaging studies including CTAP CXR I independently visualized the following imaging with scope of interpretation limited to determining acute life threatening conditions related to emergency care; findings noted above I agree with the radiologist interpretation If any imaging was obtained with contrast I closely monitored patient for any possible adverse reaction a/w contrast administration in the emergency department   Medicines ordered and prescription drug management: Meds ordered this encounter  Medications   alum & mag hydroxide-simeth (MAALOX/MYLANTA) 200-200-20 MG/5ML suspension 30 mL   iohexol  (OMNIPAQUE ) 350 MG/ML injection 75 mL    -I have reviewed the patients home medicines and have made adjustments as needed   Consultations Obtained: na   Cardiac Monitoring: The patient was maintained on a cardiac monitor.  I personally viewed and interpreted the cardiac monitored which showed an underlying rhythm of: nsr Continuous pulse oximetry  interpreted by myself, 99% on RA.    Social Determinants of Health:  Diagnosis or treatment significantly limited by social determinants of health: na   Reevaluation: After the interventions noted above, I reevaluated the patient and found that they have resolved  Co morbidities that complicate the patient evaluation  Past Medical History:  Diagnosis Date   Arthritis    Chest pain    Chronic back pain    "neurostimulator electrode leads overlie the lower thoracic spinal canal" - per cxr report 07/10/14   Chronic kidney disease    kidney stone   Chronic UTI (urinary tract infection)    Coronary artery disease    NONOBSTRUCTIVE   Depression    Dizziness    GERD (gastroesophageal reflux disease)    Headache    History of kidney stones    Hyperlipidemia    Hypertension    Left foot drop    SINCE 2009 - RELATED TO BACK PROBLEM - WEARS BRACE   PONV (postoperative nausea and vomiting)    Spinal stenosis    Thyroid disease    hypothryoid      Dispostion: Disposition decision including need for hospitalization was considered, and patient discharged from emergency department.    Final Clinical Impression(s) / ED Diagnoses Final diagnoses:  Constipation, unspecified constipation type  Hiatal hernia  Liver hemangioma  Moderate risk chest pain        Teddi Favors, DO 01/23/24 1548

## 2024-01-23 NOTE — ED Triage Notes (Signed)
 Pt from home for CP started at 0800 on left side, non radiating. EMS gave 324mg  of aspirin  and 0.4 of nitroglycerin. HX of dementia.

## 2024-01-23 NOTE — ED Notes (Signed)
 Patient transported to X-ray

## 2024-02-09 ENCOUNTER — Encounter: Payer: Self-pay | Admitting: Neurology

## 2024-02-09 ENCOUNTER — Ambulatory Visit: Payer: Medicare Other | Admitting: Neurology

## 2024-02-09 NOTE — Progress Notes (Deleted)
 ASSESSMENT AND PLAN 83 y.o. year old female    Dementia with agitation  Agitation continues to be a problem  Discussed with her daughter, decided to add on an Abilify  5 mg every morning, Ativan  1 mg as needed, Seroquel  25 mg 2 to 3 tablets at nighttime,   Will also try Rexulit if approved by her insurance  Return To Clinic With NP In 6 Months      DIAGNOSTIC DATA (LABS, IMAGING, TESTING) - I reviewed patient records, labs, notes, testing and imaging myself where available. CT head without contrast November 07, 2021, small vessel disease, no acute abnormality, mild generalized atrophy  CT abdomen chest, large hiatal hernia/intrathoracic stomach, aortic atherosclerosis   History of present illness: [name] is a 83 year old female, seen in request by her primary care physician Dr. Elvan Hamel, Aron Lard for evaluation of sudden onset dizziness, gait abnormality, she is accompanied by her daughter [name], granddaughter [name] are at today's clinical visit.  Initial evaluation was on May 18, 2018.   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, there was no associated hemorrhage, or acute abnormality,  Over the years, she developed worsening agitations,  Aricept in March 2022 did not help her, made agitation worse, tolerating Namenda  10 mg twice a day  She was given titrating dose of Seroquel , Ativan  as needed for agitation, based on impression of 2023, while she was taking Abilify , she seems to be  doing okay,  Multiple hospital admissions for worsening agitations, sometimes associated with UTI  She lives with her daughter [name], reported worsening agitations, now on Seroquel  25 mg 2 tablets every night, no longer on aimovig 5, every time she goes to hospital, they are adjustment of medications, she is also on Zoloft  200 mg every day, daughter was not sure about the benefit, lorazepam  2 mg tablet 2 tablets in 1 day in divided dose,  Daughter reported that her agitation can be so bad, crying in the morning, threw her cane at her daughter  Update Feb 09, 2024 SS: Last visit with Dr. Gracie Lav, added Abilify  5 mg, continue Ativan  1 mg as needed, Seroquel  50 to 75 mg at bedtime, also try to get approval for Rexulti .  Behavior worsening with Abilify , it was stopped.  Admitted in March 2025 for UTI back in the ER 01/16/2024 for UTI.  Constipation 01/23/2024.   PHYSICAL EXAM  Vitals:   01/15/22 1030  BP: 108/65  Pulse: 74  Weight: 137 lb 8 oz (62.4 kg)  Height: 5\' 4"  (1.626 m)   Body mass index is 23.6 kg/m.    01/15/2022   10:35 AM 01/14/2021    7:45 AM  Montreal Cognitive Assessment   Visuospatial/ Executive (0/5) 3 2  Naming (0/3) 3 3  Attention: Read list of digits (0/2) 0 1  Attention: Read list of letters (0/1) 0 1  Attention: Serial 7 subtraction starting at 100 (0/3) 1 1  Language: Repeat phrase (0/2) 2 2  Language : Fluency (0/1) 0 0  Abstraction (  0/2) 0 1  Delayed Recall (0/5) 0 0  Orientation (0/6) 3 6  Total 12 17  Adjusted Score (based on education) 13 17    Physical Exam  General: The patient is alert and cooperative at the time of the examination.  Skin: No significant peripheral edema is noted.  Neurologic Exam  Mental status: The patient is alert, cooperative, very pleasant.  Relies on her daughter for history.  Cranial nerves: Facial symmetry is present. Speech is normal, no aphasia or dysarthria is noted. Extraocular movements are full. Visual fields are  full.  Motor: Left foot drop, could not resolve left ankle against gravity, no other muscle weakness  Sensory examination: Soft touch sensation is symmetric on the face, arms, and legs.  Coordination: The patient has good finger-nose-finger and heel-to-shin bilaterally.  Gait and station: Left foot drop, cultures  Reflexes: Deep tendon reflexes are symmetric.  REVIEW OF SYSTEMS: Out of a complete 14 system review of symptoms, the patient complains only of the following symptoms, and all other reviewed systems are negative.  See HPI  ALLERGIES: Allergies  Allergen Reactions   Nsaids Other (See Comments)    GI upset  other    HOME MEDICATIONS: Outpatient Medications Prior to Visit  Medication Sig Dispense Refill   acetaminophen  (TYLENOL ) 500 MG tablet Take 500 mg by mouth every 6 (six) hours as needed for moderate pain.     aspirin  81 MG tablet Take 81 mg by mouth in the morning.     calcium  carbonate (TUMS - DOSED IN MG ELEMENTAL CALCIUM ) 500 MG chewable tablet Chew 1 tablet by mouth as needed for indigestion or heartburn.     Cholecalciferol  (VITAMIN D ) 125 MCG (5000 UT) CAPS Take 5,000 Units by mouth daily.     doxazosin  (CARDURA ) 8 MG tablet Take 8 mg by mouth daily.     ferrous sulfate  324 MG TBEC Take 324 mg by mouth in the morning.     fluticasone (FLONASE) 50 MCG/ACT nasal spray Place 1 spray into both nostrils daily as needed for allergies or rhinitis.     gabapentin  (NEURONTIN ) 100 MG capsule Take 100 mg by mouth 2 (two) times daily.     isosorbide  mononitrate (IMDUR ) 60 MG 24 hr tablet Take 60 mg by mouth daily.     levothyroxine  (SYNTHROID ) 75 MCG tablet Take 75 mcg by mouth in the morning.     LORazepam  (ATIVAN ) 1 MG tablet TAKE 1 TABLET BY MOUTH EVERY 8 HOURS AS NEEDED. 90 tablet 3   Multiple Vitamin (MULTIVITAMIN) tablet Take 1 tablet by mouth every morning.     ondansetron  (ZOFRAN -ODT) 8 MG disintegrating tablet Take 1 tablet (8 mg total) by mouth every 8 (eight)  hours as needed for nausea or vomiting. 20 tablet 0   QUEtiapine  (SEROQUEL ) 25 MG tablet TAKE 1 TABLET BY MOUTH EACH MORNING, 1 TABLET IN THE AFTERNOON , AND 3 TABLETS AT BEDTIME (Patient taking differently: Take 25-75 mg by mouth See admin instructions. TAKE 1 TABLET BY MOUTH EACH MORNING, 1 TABLET IN THE AFTERNOON , AND 3 TABLETS AT BEDTIME) 450 tablet 1   sertraline  (ZOLOFT ) 100 MG tablet Take 200 mg by mouth in the morning.     simvastatin  (ZOCOR ) 40 MG tablet Take 40 mg by mouth at bedtime.     trimethoprim (TRIMPEX) 100 MG tablet Take 1 tablet by mouth at bedtime.     No facility-administered medications prior to visit.    PAST MEDICAL HISTORY: Past Medical History:  Diagnosis Date   Arthritis    Chest pain    Chronic back pain    "neurostimulator electrode leads overlie the lower thoracic spinal canal" - per cxr report 07/10/14   Chronic kidney disease    kidney stone   Chronic UTI (urinary tract infection)    Coronary artery disease    NONOBSTRUCTIVE   Depression    Dizziness    GERD (gastroesophageal reflux disease)    Headache    History of kidney stones    Hyperlipidemia    Hypertension    Left foot drop    SINCE 2009 - RELATED TO BACK PROBLEM - WEARS BRACE   PONV (postoperative nausea and vomiting)    Spinal stenosis    Thyroid disease    hypothryoid    PAST SURGICAL HISTORY: Past Surgical History:  Procedure Laterality Date   BACK SURGERY     CARDIAC CATHETERIZATION  02/22/2009   EF 60%   CATARACT EXTRACTION     both eyes   COLONOSCOPY     CYSTOSCOPY     FOR KIDNEY STONE   LUMBAR LAMINECTOMY/DECOMPRESSION MICRODISCECTOMY     TOTAL HIP ARTHROPLASTY Left 07/17/2014   Procedure: LEFT TOTAL HIP ARTHROPLASTY ANTERIOR APPROACH;  Surgeon: Bevin Bucks, MD;  Location: [facility];  Service: Orthopedics;  Laterality: Left;    FAMILY HISTORY: Family History  Problem Relation Age of Onset   Cancer Mother        started in gallbladder then spread to liver   Heart  attack Father    Colon cancer Neg Hx    Esophageal cancer Neg Hx    Rectal cancer Neg Hx    Stomach cancer Neg Hx     SOCIAL HISTORY: Social History   Socioeconomic History   Marital status: Widowed    Spouse name: Not on file   Number of children: 2   Years of education: 12   Highest education level: High school graduate  Occupational History   Occupation: Retired  Tobacco Use   Smoking status: Never   Smokeless tobacco: Never  Vaping Use   Vaping status: Never Used  Substance and Sexual Activity   Alcohol use: No    Alcohol/week: 0.0 standard drinks of alcohol   Drug use: No   Sexual activity: Not Currently  Other Topics Concern   Not on file  Social History Narrative   Lives with her granddaughter.   Left-handed.   No caffeine use.   Social Drivers of Corporate investment banker Strain: Not on file  Food Insecurity: Patient Unable To Answer (12/22/2023)   Hunger Vital Sign    Worried About Running Out of Food in the Last Year: Patient unable to answer    Ran Out of Food in the Last Year: Patient unable to answer  Transportation Needs: Patient Unable To Answer (12/22/2023)   PRAPARE - Transportation    Lack of Transportation (Medical): Patient unable to answer    Lack of Transportation (Non-Medical): Patient unable to answer  Physical Activity: Not on file  Stress: Not on file  Social Connections: Unknown (12/22/2023)   Social Connection and Isolation Panel [NHANES]    Frequency of Communication with Friends and Family: More than three times a week    Frequency of Social Gatherings with Friends and Family: More than three times a week    Attends Religious Services: More than 4 times per year    Active Member of Clubs or Organizations: Patient unable to answer  Attends Banker Meetings: Patient unable to answer    Marital Status: Married  Intimate Partner Violence: Patient Unable To Answer (12/22/2023)   Humiliation, Afraid, Rape, and Kick  questionnaire    Fear of Current or Ex-Partner: Patient unable to answer    Emotionally Abused: Patient unable to answer    Physically Abused: Patient unable to answer    Sexually Abused: Patient unable to answer    Cortland Ding, DNP  [facility] [address], [address] [place], [place] [address] [phone]

## 2024-02-14 ENCOUNTER — Telehealth: Payer: Self-pay | Admitting: Neurology

## 2024-02-14 NOTE — Telephone Encounter (Signed)
 R/s missed appointment

## 2024-02-15 ENCOUNTER — Other Ambulatory Visit: Payer: Self-pay

## 2024-02-15 ENCOUNTER — Emergency Department (HOSPITAL_COMMUNITY)

## 2024-02-15 ENCOUNTER — Encounter: Payer: Self-pay | Admitting: Neurology

## 2024-02-15 ENCOUNTER — Emergency Department (HOSPITAL_COMMUNITY)
Admission: EM | Admit: 2024-02-15 | Discharge: 2024-02-16 | Disposition: A | Discharge status: Home / Self Care | Attending: Emergency Medicine | Admitting: Emergency Medicine

## 2024-02-15 ENCOUNTER — Encounter (HOSPITAL_COMMUNITY): Payer: Self-pay

## 2024-02-15 ENCOUNTER — Ambulatory Visit: Admitting: Neurology

## 2024-02-15 VITALS — BP 91/57 | HR 65 | Resp 16 | Ht 64.0 in

## 2024-02-15 DIAGNOSIS — Z79899 Other long term (current) drug therapy: Secondary | ICD-10-CM | POA: Insufficient documentation

## 2024-02-15 DIAGNOSIS — W19XXXA Unspecified fall, initial encounter: Secondary | ICD-10-CM | POA: Insufficient documentation

## 2024-02-15 DIAGNOSIS — Z7982 Long term (current) use of aspirin: Secondary | ICD-10-CM | POA: Diagnosis not present

## 2024-02-15 DIAGNOSIS — F0390 Unspecified dementia without behavioral disturbance: Secondary | ICD-10-CM | POA: Diagnosis not present

## 2024-02-15 DIAGNOSIS — R451 Restlessness and agitation: Secondary | ICD-10-CM | POA: Insufficient documentation

## 2024-02-15 DIAGNOSIS — R531 Weakness: Secondary | ICD-10-CM | POA: Diagnosis present

## 2024-02-15 DIAGNOSIS — R410 Disorientation, unspecified: Secondary | ICD-10-CM | POA: Diagnosis not present

## 2024-02-15 DIAGNOSIS — F03918 Unspecified dementia, unspecified severity, with other behavioral disturbance: Secondary | ICD-10-CM | POA: Diagnosis not present

## 2024-02-15 DIAGNOSIS — I10 Essential (primary) hypertension: Secondary | ICD-10-CM | POA: Insufficient documentation

## 2024-02-15 LAB — CBC WITH DIFFERENTIAL/PLATELET
Abs Immature Granulocytes: 0.02 10*3/uL (ref 0.00–0.07)
Basophils Absolute: 0 10*3/uL (ref 0.0–0.1)
Basophils Relative: 0 %
Eosinophils Absolute: 0.1 10*3/uL (ref 0.0–0.5)
Eosinophils Relative: 2 %
HCT: 32.6 % — ABNORMAL LOW (ref 36.0–46.0)
Hemoglobin: 10.7 g/dL — ABNORMAL LOW (ref 12.0–15.0)
Immature Granulocytes: 0 %
Lymphocytes Relative: 35 %
Lymphs Abs: 1.6 10*3/uL (ref 0.7–4.0)
MCH: 32.7 pg (ref 26.0–34.0)
MCHC: 32.8 g/dL (ref 30.0–36.0)
MCV: 99.7 fL (ref 80.0–100.0)
Monocytes Absolute: 0.4 10*3/uL (ref 0.1–1.0)
Monocytes Relative: 8 %
Neutro Abs: 2.6 10*3/uL (ref 1.7–7.7)
Neutrophils Relative %: 55 %
Platelets: 144 10*3/uL — ABNORMAL LOW (ref 150–400)
RBC: 3.27 MIL/uL — ABNORMAL LOW (ref 3.87–5.11)
RDW: 12.5 % (ref 11.5–15.5)
WBC: 4.7 10*3/uL (ref 4.0–10.5)
nRBC: 0 % (ref 0.0–0.2)

## 2024-02-15 LAB — URINALYSIS, W/ REFLEX TO CULTURE (INFECTION SUSPECTED)
Bilirubin Urine: NEGATIVE
Glucose, UA: NEGATIVE mg/dL
Hgb urine dipstick: NEGATIVE
Ketones, ur: NEGATIVE mg/dL
Nitrite: NEGATIVE
Protein, ur: NEGATIVE mg/dL
Specific Gravity, Urine: >1.03 — ABNORMAL HIGH (ref 1.005–1.030)
pH: 5.5 (ref 5.0–8.0)

## 2024-02-15 LAB — COMPREHENSIVE METABOLIC PANEL WITH GFR
ALT: 15 U/L (ref 0–44)
AST: 23 U/L (ref 15–41)
Albumin: 3.6 g/dL (ref 3.5–5.0)
Alkaline Phosphatase: 85 U/L (ref 38–126)
Anion gap: 9 (ref 5–15)
BUN: 21 mg/dL (ref 8–23)
CO2: 25 mmol/L (ref 22–32)
Calcium: 9.1 mg/dL (ref 8.9–10.3)
Chloride: 107 mmol/L (ref 98–111)
Creatinine, Ser: 0.88 mg/dL (ref 0.44–1.00)
GFR, Estimated: >60 mL/min (ref >60)
Glucose, Bld: 86 mg/dL (ref 70–99)
Potassium: 3.8 mmol/L (ref 3.5–5.1)
Sodium: 141 mmol/L (ref 135–145)
Total Bilirubin: 0.5 mg/dL (ref 0.0–1.2)
Total Protein: 5.5 g/dL — ABNORMAL LOW (ref 6.5–8.1)

## 2024-02-15 MED ORDER — REXULTI 0.5 MG PO TABS: 0.5000 mg | ORAL_TABLET | Freq: Every day | ORAL | 5 refills | Status: DC

## 2024-02-15 NOTE — ED Provider Notes (Signed)
 [place] EMERGENCY DEPARTMENT AT [facility] Provider Note   CSN: [id number] Arrival date & time: 02/15/24  2051     History  Chief Complaint  Patient presents with   Fall    No Blood Thinners    [name] is a 83 y.o. female.  With a history of dementia, hypertension, recurrent UTIs who presents to the ED after fall.  Patient lives with her daughter who reports that she heard her fall while she was in the other room.  She appears to have "slid" downwards off of the furniture.  Unable to get up under her own power.  Daughter voices concern for increased confusion and generalized weakness over today.  She states that the patient was more agitated than usual this morning she gave her a small dose of Ativan .  This may have contributed to her confusion and generalized weakness.  Patient unable to provide additional clinical history at this time secondary to clinical condition but has no systemic complaints at this time.  Denies headache chest pain neck pain shortness of breath or pain in extremities   Fall       Home Medications Prior to Admission medications   Medication Sig Start Date End Date Taking? Authorizing Provider  acetaminophen  (TYLENOL ) 500 MG tablet Take 500 mg by mouth every 6 (six) hours as needed for moderate pain.    [provider]  aspirin  81 MG tablet Take 81 mg by mouth in the morning.    [provider]  Brexpiprazole  (REXULTI ) 0.5 MG TABS Take 1 tablet (0.5 mg total) by mouth at bedtime. 02/15/24   Wess Hammed, NP  calcium  carbonate (TUMS - DOSED IN MG ELEMENTAL CALCIUM ) 500 MG chewable tablet Chew 1 tablet by mouth as needed for indigestion or heartburn.    [provider]  Cholecalciferol  (VITAMIN D ) 125 MCG (5000 UT) CAPS Take 5,000 Units by mouth daily.    [provider]  doxazosin  (CARDURA ) 8 MG tablet Take 8 mg by mouth daily.    [provider]  ferrous sulfate  324 MG TBEC Take 324 mg by  mouth in the morning.    [provider]  fluticasone (FLONASE) 50 MCG/ACT nasal spray Place 1 spray into both nostrils daily as needed for allergies or rhinitis.    [provider]  gabapentin  (NEURONTIN ) 100 MG capsule Take 100 mg by mouth 2 (two) times daily.    [provider]  isosorbide  mononitrate (IMDUR ) 60 MG 24 hr tablet Take 60 mg by mouth daily.    [provider]  levothyroxine  (SYNTHROID ) 75 MCG tablet Take 75 mcg by mouth in the morning. 12/18/20   [provider]  LORazepam  (ATIVAN ) 1 MG tablet TAKE 1 TABLET BY MOUTH EVERY 8 HOURS AS NEEDED. 01/12/24   [name], MD  Multiple Vitamin (MULTIVITAMIN) tablet Take 1 tablet by mouth every morning.    [provider]  ondansetron  (ZOFRAN -ODT) 8 MG disintegrating tablet Take 1 tablet (8 mg total) by mouth every 8 (eight) hours as needed for nausea or vomiting. 06/11/22   [name], MD  QUEtiapine  (SEROQUEL ) 25 MG tablet TAKE 1 TABLET BY MOUTH EACH MORNING, 1 TABLET IN THE AFTERNOON , AND 3 TABLETS AT BEDTIME Patient taking differently: Take 25-75 mg by mouth See admin instructions. TAKE 1 TABLET BY MOUTH EACH MORNING, 1 TABLET IN THE AFTERNOON , AND 3 TABLETS AT BEDTIME 11/25/23   [name], MD  sertraline  (ZOLOFT ) 100 MG tablet Take  200 mg by mouth in the morning.    [provider]  simvastatin  (ZOCOR ) 40 MG tablet Take 40 mg by mouth at bedtime. 10/31/20   [provider]  trimethoprim (TRIMPEX) 100 MG tablet Take 1 tablet by mouth at bedtime. 11/24/23 02/22/24  [provider]      Allergies    Nsaids    Review of Systems   Review of Systems  Physical Exam Updated Vital Signs BP (!) 166/68   Pulse (!) 58   Temp 98.9 F (37.2 C) (Oral)   Resp 14   Ht 5\' 4"  (1.626 m)   Wt 54 kg   SpO2 100%   BMI 20.43 kg/m  Physical Exam Vitals and nursing note reviewed.  Constitutional:      Comments: Frail  HENT:     Head: Normocephalic and atraumatic.   Eyes:     Pupils: Pupils are equal, round, and reactive to light.  Cardiovascular:     Rate and Rhythm: Normal rate and regular rhythm.  Pulmonary:     Effort: Pulmonary effort is normal.     Breath sounds: Normal breath sounds.  Abdominal:     Palpations: Abdomen is soft.     Tenderness: There is no abdominal tenderness.  Musculoskeletal:     Comments: C-collar in place, exam deferred, no midline tenderness step-off deformity of thoracic or lumbar spine 5 out of 5 motor strength bilateral upper and lower extremities Pelvis stable to rocking compression  Skin:    General: Skin is warm and dry.  Neurological:     General: No focal deficit present.     Mental Status: She is alert.     Sensory: No sensory deficit.     Motor: No weakness.     Comments: Oriented to self only  Psychiatric:        Mood and Affect: Mood normal.     ED Results / Procedures / Treatments   Labs (all labs ordered are listed, but only abnormal results are displayed) Labs Reviewed  COMPREHENSIVE METABOLIC PANEL WITH GFR - Abnormal; Notable for the following components:      Result Value   Total Protein 5.5 (*)    All other components within normal limits  CBC WITH DIFFERENTIAL/PLATELET - Abnormal; Notable for the following components:   RBC 3.27 (*)    Hemoglobin 10.7 (*)    HCT 32.6 (*)    Platelets 144 (*)    All other components within normal limits  URINALYSIS, W/ REFLEX TO CULTURE (INFECTION SUSPECTED)    EKG EKG Interpretation Date/Time:  Tuesday Feb 15 2024 22:33:13 EDT Ventricular Rate:  54 PR Interval:  168 QRS Duration:  86 QT Interval:  474 QTC Calculation: 449 R Axis:   -34  Text Interpretation: Sinus bradycardia Left axis deviation Abnormal ECG When compared with ECG of 23-Jan-2024 10:18, PREVIOUS ECG IS PRESENT Confirmed by Rafael Bun [phone]) on 02/15/2024 11:18:00 PM  Radiology DG Chest Portable 1 View Result Date: 02/15/2024 CLINICAL DATA:  Fall EXAM: PORTABLE CHEST 1  VIEW COMPARISON:  01/23/2024 FINDINGS: Cardiomegaly. No acute airspace disease, pleural effusion or pneumothorax. Aortic atherosclerosis. Large retrocardiac opacity, consistent with hiatal hernia IMPRESSION: No active disease. Cardiomegaly. Large hiatal hernia. Electronically Signed   By: Esmeralda Hedge M.D.   On: 02/15/2024 22:34   CT Cervical Spine Wo Contrast Result Date: 02/15/2024 CLINICAL DATA:  Neck trauma (Age >= 65y).  Fall. EXAM: CT CERVICAL SPINE WITHOUT CONTRAST TECHNIQUE: Multidetector CT imaging of the cervical  spine was performed without intravenous contrast. Multiplanar CT image reconstructions were also generated. RADIATION DOSE REDUCTION: This exam was performed according to the departmental dose-optimization program which includes automated exposure control, adjustment of the mA and/or kV according to patient size and/or use of iterative reconstruction technique. COMPARISON:  12/18/2023 FINDINGS: Alignment: Normal Skull base and vertebrae: No acute fracture. No primary bone lesion or focal pathologic process. Soft tissues and spinal canal: No prevertebral fluid or swelling. No visible canal hematoma. Disc levels: Disc space narrowing and spurring at C6-7. Mild bilateral degenerative facet disease. No visible focal disc herniation. Upper chest: Biapical scarring. Other: None IMPRESSION: Degenerative disc and facet disease.  No acute bony abnormality. Electronically Signed   By: Janeece Mechanic M.D.   On: 02/15/2024 22:21   CT Head Wo Contrast Result Date: 02/15/2024 CLINICAL DATA:  Fall.  Head trauma EXAM: CT HEAD WITHOUT CONTRAST TECHNIQUE: Contiguous axial images were obtained from the base of the skull through the vertex without intravenous contrast. RADIATION DOSE REDUCTION: This exam was performed according to the departmental dose-optimization program which includes automated exposure control, adjustment of the mA and/or kV according to patient size and/or use of iterative reconstruction  technique. COMPARISON:  01/16/2024 FINDINGS: Brain: There is atrophy and chronic small vessel disease changes. No acute intracranial abnormality. Specifically, no hemorrhage, hydrocephalus, mass lesion, acute infarction, or significant intracranial injury. Patchy chronic small vessel disease throughout the deep white matter. Vascular: No hyperdense vessel or unexpected calcification. Skull: No acute calvarial abnormality. Sinuses/Orbits: No acute findings Other: None IMPRESSION: No acute intracranial abnormality. Electronically Signed   By: Janeece Mechanic M.D.   On: 02/15/2024 22:19    Procedures Procedures    Medications Ordered in ED Medications - No data to display  ED Course/ Medical Decision Making/ A&P Clinical Course as of 02/15/24 2318  Tue Feb 15, 2024  2316 No traumatic findings.  EKG without evidence of dysrhythmia.  Chest x-ray looks clear no pneumonia.  Electrolytes renal function at baseline.  Hemoglobin also appears to be at baseline for her.  Awaiting UA  I, Rafael Bun DO, am transitioning care of this patient to the oncoming provider pending urinalysis reevaluation and disposition [MP]    Clinical Course User Index [MP] Sallyanne Creamer, DO                                 Medical Decision Making 83 year old female with history as above presenting after fall at home.  Has exhibited signs of generalized weakness and has been more agitated and confused over the last 24 hours.  Was given a small dose of p.o. Ativan  early this morning which she does not normally take at home.  Had a fall at home but no evidence of trauma.  C-collar in place.  No anticoagulation but given history of dementia and her advanced age will obtain CT head C-spine to look for traumatic injury.  No focal neurologic deficits and she is back to her mental baseline.  Low suspicion for TIA/CVA.  Daughter also voices concern for another UTI as source of her lethargy and weakness earlier.  We will obtain  laboratory workup to look for infectious causes including UA and chest x-ray to evaluate for UTI and pneumonia respectively.  Suspect if she remains at her baseline, vital signs look okay and ED workup is unremarkable she may be able to go home under the care of her daughter  Amount and/or Complexity of Data Reviewed Labs: ordered. Radiology: ordered.           Final Clinical Impression(s) / ED Diagnoses Final diagnoses:  Fall, initial encounter    Rx / DC Orders ED Discharge Orders     None         Sallyanne Creamer, DO 02/15/24 2318

## 2024-02-15 NOTE — ED Triage Notes (Addendum)
 Patient BIB EMS after a witnessed fall at home. Patient family reports patient had a fall but did not hit her head and had no LOC. Family reports patient is not on blood thinners. Patient c/o pain in the lower back and shoulders. Patient in C-Collar on arrival. According to EMS family concerned patient has UTI. Reports patietn seems more confused and agitated that usual. Patient with hx of dementia. Alert to self on arrival. Following commands approprioately.

## 2024-02-15 NOTE — Discharge Instructions (Signed)
 [name] was seen in the emergency department after a fall CAT scan of her head and neck and chest x-ray looked okay Her blood work also looked okay Her urine test did not show evidence of a UTI Her temporary weakness may have been caused by Ativan  earlier this morning She should follow-up with her primary care doctor within 1 week for reevaluation Return to the emergency department for repeated falls, severe weakness or any concerns

## 2024-02-15 NOTE — ED Provider Notes (Signed)
 Care assumed from Dr. Ranelle Buys, patient with transient confusion pending UA. Plan is for discharge if negative.  Urinalysis shows no evidence of infection.  I am discharging her to return to her skilled nursing facility.  It is noted that she had received benzodiazepines earlier in the day and that may have accounted for her altered mentation.   [name], MD 02/15/24 [phone]

## 2024-02-15 NOTE — Progress Notes (Signed)
 ASSESSMENT AND PLAN 83 y.o. year old female    Dementia with agitation  - Continues with agitation despite Ativan  1 mg as needed, Seroquel  100 mg daily -Start Rexulti  0.5 mg at bedtime, once we get approval, will titrate up while weaning Seroquel   - Continue Ativan  1 mg as needed, try to give 1/2 tablet, when taking regularly feels makes mood worse  - Previously tried and failed: Abilify  - Referral to geriatric psychiatry - Given information on adult day programs in the community. Daughters are pursuing memory unit placement -BP on lower end today, monitor at home, follow up with PCP, she did take an Ativan  today, 1st time in awhile  - Follow-up in 6 months   Meds ordered this encounter  Medications   Brexpiprazole  (REXULTI ) 0.5 MG TABS    Sig: Take 1 tablet (0.5 mg total) by mouth at bedtime.    Dispense:  30 tablet    Refill:  5   DIAGNOSTIC DATA (LABS, IMAGING, TESTING) - I reviewed patient records, labs, notes, testing and imaging myself where available. CT head without contrast November 07, 2021, small vessel disease, no acute abnormality, mild generalized atrophy  CT abdomen chest, large hiatal hernia/intrathoracic stomach, aortic atherosclerosis   History of present illness: [name] is a 83 year old female, seen in request by her primary care physician Dr. Elvan Hamel, Aron Lard for evaluation of sudden onset dizziness, gait abnormality, she is accompanied by her daughter [name], granddaughter [name] are at today's clinical visit.  Initial evaluation was on May 18, 2018.   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, there was no associated hemorrhage, or acute abnormality,  Over the years, she developed worsening agitations,  Aricept in March 2022 did not help her, made agitation worse, tolerating Namenda  10 mg twice a day  She was given titrating dose of Seroquel , Ativan  as needed for agitation, based on impression of 2023, while she was taking Abilify , she seems to be doing okay,  Multiple hospital admissions for worsening agitations, sometimes associated with UTI  She lives with her daughter [name], reported worsening agitations, now on Seroquel  25 mg 2 tablets every night, no longer on aimovig 5, every time she goes to hospital, they are adjustment of medications, she is also on Zoloft  200 mg every day, daughter was not sure about the benefit, lorazepam  2 mg tablet 2 tablets in 1 day in divided dose,  Daughter reported that her agitation can be so bad, crying in the morning, threw her cane at her daughter  Update Feb 09, 2024 SS: Last visit with Dr. Gracie Lav, added Abilify  5 mg, continue Ativan  1 mg as needed, Seroquel  50 to 75 mg at bedtime, also try to get approval for Rexulti .  Behavior worsening with Abilify , it was stopped.  Admitted in March 2025 for UTI back in the ER 01/16/2024 for UTI.  Constipation 01/23/2024. For last 2 months daughter stopped giving he Ativan , felt it made her angrier. This morning, was agitated today was given Ativan  is  great today. At home dealing with anger, agitation, hits her daughter, yells. Working on placement at nursing home. Needs assistance with showering, can feed herself, go to the bathroom, will clean/vacuum, matches socks. Sleeps really well at night.   PHYSICAL EXAM  Vitals:   01/15/22 1030  BP: 108/65  Pulse: 74  Weight: 137 lb 8 oz (62.4 kg)  Height: 5\' 4"  (1.626 m)   Body mass index is 23.6 kg/m.    01/15/2022   10:35 AM  01/14/2021    7:45 AM  Montreal Cognitive Assessment   Visuospatial/ Executive (0/5) 3 2  Naming (0/3) 3 3  Attention: Read list of digits (0/2) 0 1  Attention: Read list of letters (0/1) 0 1  Attention: Serial 7 subtraction starting at 100 (0/3) 1 1  Language: Repeat phrase (0/2) 2 2  Language : Fluency (0/1) 0 0  Abstraction (0/2) 0 1  Delayed Recall (0/5) 0 0  Orientation (0/6) 3 6  Total 12 17  Adjusted Score (based on education) 13 17    Physical Exam  General: The patient is alert, cooperative.  Sitting quietly in wheelchair. Appears interested in visit, but doesn't interject. Thin elderly female.   Skin: No significant peripheral edema is noted.  Neurologic Exam  Mental status: The patient is alert, cooperative, smiles Relies on her daughter for history. Verbal response is limited, difficult to understand, uses incorrect words   Cranial nerves: Facial symmetry is present. Speech is normal, no aphasia or dysarthria is noted. Extraocular movements are full. Visual fields are full.  Motor: Left foot drop, otherwise good muscle strength  Sensory examination: Soft touch sensation is symmetric on the face, arms, and legs.  Coordination: Mild difficulty performing these exam commands, better with finger-nose-finger and heel-to-shin  Gait and station: Left foot drop, unsteady, forward leaning   REVIEW OF SYSTEMS: Out of a complete 14 system review of symptoms, the patient complains only of the following symptoms, and all other reviewed systems are negative.  See HPI  ALLERGIES: Allergies  Allergen Reactions   Nsaids Other (See Comments)    GI upset  other    HOME MEDICATIONS: Outpatient Medications Prior to Visit  Medication Sig Dispense Refill   acetaminophen  (TYLENOL ) 500 MG tablet Take 500 mg by mouth every 6 (six) hours as needed for moderate pain.     aspirin  81 MG tablet Take 81 mg by mouth in the morning.     calcium  carbonate (TUMS - DOSED IN MG ELEMENTAL  CALCIUM ) 500 MG chewable tablet Chew 1 tablet by mouth as needed for indigestion or heartburn.     Cholecalciferol  (VITAMIN D ) 125 MCG (5000 UT) CAPS Take 5,000 Units by mouth daily.     doxazosin  (CARDURA ) 8 MG tablet Take 8 mg by mouth daily.     ferrous sulfate  324 MG TBEC Take 324 mg by mouth in the morning.     fluticasone (FLONASE) 50 MCG/ACT nasal spray Place 1 spray into both nostrils daily as needed for allergies or rhinitis.     gabapentin  (NEURONTIN ) 100 MG capsule Take 100 mg by mouth 2 (two) times daily.     isosorbide  mononitrate (IMDUR ) 60 MG 24 hr tablet Take 60 mg by mouth daily.     levothyroxine  (SYNTHROID ) 75 MCG tablet Take 75 mcg by mouth in the morning.     LORazepam  (ATIVAN ) 1 MG tablet TAKE 1 TABLET BY MOUTH EVERY 8 HOURS AS NEEDED. 90 tablet 3   Multiple Vitamin (MULTIVITAMIN) tablet Take  1 tablet by mouth every morning.     ondansetron  (ZOFRAN -ODT) 8 MG disintegrating tablet Take 1 tablet (8 mg total) by mouth every 8 (eight) hours as needed for nausea or vomiting. 20 tablet 0   QUEtiapine  (SEROQUEL ) 25 MG tablet TAKE 1 TABLET BY MOUTH EACH MORNING, 1 TABLET IN THE AFTERNOON , AND 3 TABLETS AT BEDTIME (Patient taking differently: Take 25-75 mg by mouth See admin instructions. TAKE 1 TABLET BY MOUTH EACH MORNING, 1 TABLET IN THE AFTERNOON , AND 3 TABLETS AT BEDTIME) 450 tablet 1   sertraline  (ZOLOFT ) 100 MG tablet Take 200 mg by mouth in the morning.     simvastatin  (ZOCOR ) 40 MG tablet Take 40 mg by mouth at bedtime.     trimethoprim (TRIMPEX) 100 MG tablet Take 1 tablet by mouth at bedtime.     No facility-administered medications prior to visit.    PAST MEDICAL HISTORY: Past Medical History:  Diagnosis Date   Arthritis    Chest pain    Chronic back pain    "neurostimulator electrode leads overlie the lower thoracic spinal canal" - per cxr report 07/10/14   Chronic kidney disease    kidney stone   Chronic UTI (urinary tract infection)    Coronary artery  disease    NONOBSTRUCTIVE   Depression    Dizziness    GERD (gastroesophageal reflux disease)    Headache    History of kidney stones    Hyperlipidemia    Hypertension    Left foot drop    SINCE 2009 - RELATED TO BACK PROBLEM - WEARS BRACE   PONV (postoperative nausea and vomiting)    Spinal stenosis    Thyroid disease    hypothryoid    PAST SURGICAL HISTORY: Past Surgical History:  Procedure Laterality Date   BACK SURGERY     CARDIAC CATHETERIZATION  02/22/2009   EF 60%   CATARACT EXTRACTION     both eyes   COLONOSCOPY     CYSTOSCOPY     FOR KIDNEY STONE   LUMBAR LAMINECTOMY/DECOMPRESSION MICRODISCECTOMY     TOTAL HIP ARTHROPLASTY Left 07/17/2014   Procedure: LEFT TOTAL HIP ARTHROPLASTY ANTERIOR APPROACH;  Surgeon: Bevin Bucks, MD;  Location: [facility];  Service: Orthopedics;  Laterality: Left;    FAMILY HISTORY: Family History  Problem Relation Age of Onset   Cancer Mother        started in gallbladder then spread to liver   Heart attack Father    Colon cancer Neg Hx    Esophageal cancer Neg Hx    Rectal cancer Neg Hx    Stomach cancer Neg Hx     SOCIAL HISTORY: Social History   Socioeconomic History   Marital status: Widowed    Spouse name: Not on file   Number of children: 2   Years of education: 12   Highest education level: High school graduate  Occupational History   Occupation: Retired  Tobacco Use   Smoking status: Never   Smokeless tobacco: Never  Vaping Use   Vaping status: Never Used  Substance and Sexual Activity   Alcohol use: No    Alcohol/week: 0.0 standard drinks of alcohol   Drug use: No   Sexual activity: Not Currently  Other Topics Concern   Not on file  Social History Narrative   Lives with her granddaughter.   Left-handed.   No caffeine use.   Social Drivers of Corporate investment banker Strain: Not on file  Food Insecurity:  Patient Unable To Answer (12/22/2023)   Hunger Vital Sign    Worried About Running Out of Food  in the Last Year: Patient unable to answer    Ran Out of Food in the Last Year: Patient unable to answer  Transportation Needs: Patient Unable To Answer (12/22/2023)   PRAPARE - Transportation    Lack of Transportation (Medical): Patient unable to answer    Lack of Transportation (Non-Medical): Patient unable to answer  Physical Activity: Not on file  Stress: Not on file  Social Connections: Unknown (12/22/2023)   Social Connection and Isolation Panel [NHANES]    Frequency of Communication with Friends and Family: More than three times a week    Frequency of Social Gatherings with Friends and Family: More than three times a week    Attends Religious Services: More than 4 times per year    Active Member of [employer] or Organizations: Patient unable to answer    Attends Banker Meetings: Patient unable to answer    Marital Status: Married  Catering manager Violence: Patient Unable To Answer (12/22/2023)   Humiliation, Afraid, Rape, and Kick questionnaire    Fear of Current or Ex-Partner: Patient unable to answer    Emotionally Abused: Patient unable to answer    Physically Abused: Patient unable to answer    Sexually Abused: Patient unable to answer   Cortland Ding, DNP  [facility] [address] [place], [place] [address] [phone]

## 2024-02-15 NOTE — Patient Instructions (Signed)
 For now, continue current medications.  We will try to get Rexulti  approved.  Please reach out to me before you start Rexulti  so I can tell you how to wean off the Seroquel .  Both medications are antipsychotics.  Please review information on adult day programs.  I will place a referral to geriatric psychiatry.  Monitor blood pressure at home, on the lower end.  If continues to be low, follow-up with primary care.

## 2024-02-16 ENCOUNTER — Telehealth: Payer: Self-pay | Admitting: Neurology

## 2024-02-16 NOTE — Telephone Encounter (Signed)
 Referral for psychiatry fax to [facility]. Phone: [phone], Fax: [phone]

## 2024-02-16 NOTE — Progress Notes (Signed)
 Chart reviewed, agree above plan ?

## 2024-02-17 ENCOUNTER — Telehealth: Payer: Self-pay | Admitting: Neurology

## 2024-02-17 ENCOUNTER — Other Ambulatory Visit: Payer: Self-pay | Admitting: Neurology

## 2024-02-17 NOTE — Telephone Encounter (Signed)
 Called to [facility] pharmacy, medication list reconciled regarding seroquil.  Called to [facility], pharmacy closed.

## 2024-02-22 ENCOUNTER — Emergency Department (HOSPITAL_COMMUNITY)

## 2024-02-22 ENCOUNTER — Other Ambulatory Visit: Payer: Self-pay

## 2024-02-22 ENCOUNTER — Encounter (HOSPITAL_COMMUNITY): Payer: Self-pay | Admitting: Emergency Medicine

## 2024-02-22 ENCOUNTER — Emergency Department (HOSPITAL_COMMUNITY): Admission: EM | Admit: 2024-02-22 | Discharge: 2024-02-22 | Disposition: A | Discharge status: Home / Self Care

## 2024-02-22 ENCOUNTER — Telehealth: Payer: Self-pay | Admitting: Neurology

## 2024-02-22 DIAGNOSIS — D649 Anemia, unspecified: Secondary | ICD-10-CM | POA: Diagnosis not present

## 2024-02-22 DIAGNOSIS — W1830XA Fall on same level, unspecified, initial encounter: Secondary | ICD-10-CM | POA: Insufficient documentation

## 2024-02-22 DIAGNOSIS — F0390 Unspecified dementia without behavioral disturbance: Secondary | ICD-10-CM | POA: Insufficient documentation

## 2024-02-22 DIAGNOSIS — Z7982 Long term (current) use of aspirin: Secondary | ICD-10-CM | POA: Insufficient documentation

## 2024-02-22 DIAGNOSIS — M545 Low back pain, unspecified: Secondary | ICD-10-CM | POA: Diagnosis present

## 2024-02-22 DIAGNOSIS — R296 Repeated falls: Secondary | ICD-10-CM | POA: Diagnosis not present

## 2024-02-22 DIAGNOSIS — W19XXXA Unspecified fall, initial encounter: Secondary | ICD-10-CM

## 2024-02-22 LAB — TROPONIN I (HIGH SENSITIVITY): Troponin I (High Sensitivity): 5 ng/L (ref <18)

## 2024-02-22 LAB — URINALYSIS, ROUTINE W REFLEX MICROSCOPIC
Bilirubin Urine: NEGATIVE
Glucose, UA: NEGATIVE mg/dL
Hgb urine dipstick: NEGATIVE
Ketones, ur: NEGATIVE mg/dL
Nitrite: NEGATIVE
Protein, ur: 30 mg/dL — AB
Specific Gravity, Urine: 1.024 (ref 1.005–1.030)
pH: 5 (ref 5.0–8.0)

## 2024-02-22 LAB — COMPREHENSIVE METABOLIC PANEL WITH GFR
ALT: 11 U/L (ref 0–44)
AST: 20 U/L (ref 15–41)
Albumin: 3.7 g/dL (ref 3.5–5.0)
Alkaline Phosphatase: 81 U/L (ref 38–126)
Anion gap: 7 (ref 5–15)
BUN: 22 mg/dL (ref 8–23)
CO2: 24 mmol/L (ref 22–32)
Calcium: 9 mg/dL (ref 8.9–10.3)
Chloride: 108 mmol/L (ref 98–111)
Creatinine, Ser: 0.95 mg/dL (ref 0.44–1.00)
GFR, Estimated: >60 mL/min (ref >60)
Glucose, Bld: 85 mg/dL (ref 70–99)
Potassium: 4 mmol/L (ref 3.5–5.1)
Sodium: 139 mmol/L (ref 135–145)
Total Bilirubin: 0.4 mg/dL (ref 0.0–1.2)
Total Protein: 5.9 g/dL — ABNORMAL LOW (ref 6.5–8.1)

## 2024-02-22 LAB — CBC
HCT: 34.1 % — ABNORMAL LOW (ref 36.0–46.0)
Hemoglobin: 11.2 g/dL — ABNORMAL LOW (ref 12.0–15.0)
MCH: 32.4 pg (ref 26.0–34.0)
MCHC: 32.8 g/dL (ref 30.0–36.0)
MCV: 98.6 fL (ref 80.0–100.0)
Platelets: 157 10*3/uL (ref 150–400)
RBC: 3.46 MIL/uL — ABNORMAL LOW (ref 3.87–5.11)
RDW: 12.4 % (ref 11.5–15.5)
WBC: 5.8 10*3/uL (ref 4.0–10.5)
nRBC: 0 % (ref 0.0–0.2)

## 2024-02-22 MED ORDER — ACETAMINOPHEN 500 MG PO TABS
1000.0000 mg | ORAL_TABLET | Freq: Once | ORAL | Status: AC
  Administered 2024-02-22: 1000 mg via ORAL
  Filled 2024-02-22: qty 2

## 2024-02-22 MED ORDER — LIDOCAINE 5 % EX PTCH
1.0000 | MEDICATED_PATCH | Freq: Once | CUTANEOUS | Status: DC
  Administered 2024-02-22: 1 via TRANSDERMAL
  Filled 2024-02-22: qty 1

## 2024-02-22 MED ORDER — NITROFURANTOIN MONOHYD MACRO 100 MG PO CAPS: 100.0000 mg | ORAL_CAPSULE | Freq: Two times a day (BID) | ORAL | 0 refills | Status: AC

## 2024-02-22 NOTE — ED Provider Notes (Signed)
 [place] EMERGENCY DEPARTMENT AT [facility] Provider Note   CSN: [id number] Arrival date & time: 02/22/24  1829     History  Chief Complaint  Patient presents with   [name] is a 83 y.o. female.  This is a 83 year old female presenting emergency department for falls today.  Several falls today.  Not on blood thinner.  Unclear if mechanical versus other.  When daughter checked on patient she was alert, but was slow to respond.  Patient is complaining of low back pain.  Daughter at bedside notes that is chronic and largely unchanged.   Fall       Home Medications Prior to Admission medications   Medication Sig Start Date End Date Taking? Authorizing Provider  acetaminophen  (TYLENOL ) 500 MG tablet Take 500 mg by mouth every 6 (six) hours as needed for moderate pain.    [provider]  aspirin  81 MG tablet Take 81 mg by mouth in the morning.    [provider]  Brexpiprazole  (REXULTI ) 0.5 MG TABS Take 1 tablet (0.5 mg total) by mouth at bedtime. 02/15/24   Wess Hammed, NP  calcium  carbonate (TUMS - DOSED IN MG ELEMENTAL CALCIUM ) 500 MG chewable tablet Chew 1 tablet by mouth as needed for indigestion or heartburn.    [provider]  Cholecalciferol  (VITAMIN D ) 125 MCG (5000 UT) CAPS Take 5,000 Units by mouth daily.    [provider]  doxazosin  (CARDURA ) 8 MG tablet Take 8 mg by mouth daily.    [provider]  ferrous sulfate  324 MG TBEC Take 324 mg by mouth in the morning.    [provider]  fluticasone (FLONASE) 50 MCG/ACT nasal spray Place 1 spray into both nostrils daily as needed for allergies or rhinitis.    [provider]  gabapentin  (NEURONTIN ) 100 MG capsule Take 100 mg by mouth 2 (two) times daily.    [provider]  isosorbide  mononitrate (IMDUR ) 60 MG 24 hr tablet Take 60 mg by mouth daily.    [provider]  levothyroxine  (SYNTHROID ) 75 MCG tablet Take  75 mcg by mouth in the morning. 12/18/20   [provider]  LORazepam  (ATIVAN ) 1 MG tablet TAKE 1 TABLET BY MOUTH EVERY 8 HOURS AS NEEDED. 01/12/24   [name], MD  Multiple Vitamin (MULTIVITAMIN) tablet Take 1 tablet by mouth every morning.    [provider]  ondansetron  (ZOFRAN -ODT) 8 MG disintegrating tablet Take 1 tablet (8 mg total) by mouth every 8 (eight) hours as needed for nausea or vomiting. 06/11/22   [name], MD  QUEtiapine  (SEROQUEL ) 25 MG tablet TAKE 1 TABLET BY MOUTH EACH MORNING, 1 TABLET IN THE AFTERNOON , AND 3 TABLETS AT BEDTIME Patient taking differently: Take 25-75 mg by mouth See admin instructions. TAKE 1 TABLET BY MOUTH EACH MORNING, 1 TABLET IN THE AFTERNOON , AND 3 TABLETS AT BEDTIME 11/25/23   [name], MD  sertraline  (ZOLOFT ) 100 MG tablet Take 200 mg by mouth in the morning.    [provider]  simvastatin  (ZOCOR ) 40 MG tablet Take 40 mg by mouth at bedtime. 10/31/20   [provider]  trimethoprim (TRIMPEX) 100 MG tablet Take 1 tablet by mouth at bedtime. 11/24/23 02/22/24  [provider]      Allergies    Nsaids    Review of Systems   Review of Systems  Physical Exam Updated Vital Signs BP 137/85 (BP Location: Right  Arm)   Pulse 91   Temp 99.1 F (37.3 C) (Oral)   Resp 18   Wt 55 kg   SpO2 100%   BMI 20.81 kg/m  Physical Exam Vitals and nursing note reviewed.  Constitutional:      General: She is not in acute distress.    Appearance: She is not toxic-appearing.  HENT:     Head: Normocephalic.     Nose: Nose normal.  Eyes:     Conjunctiva/sclera: Conjunctivae normal.  Cardiovascular:     Rate and Rhythm: Normal rate.  Pulmonary:     Effort: Pulmonary effort is normal.     Breath sounds: Normal breath sounds.  Abdominal:     General: Abdomen is flat. There is no distension.     Palpations: Abdomen is soft.     Tenderness: There is no abdominal tenderness. There is no right CVA tenderness or  guarding.  Musculoskeletal:        General: No tenderness. Normal range of motion.     Comments: 5 out of 5 plantarflexion, dorsiflexion, normal sensation in lower extremities.  Chest wall stable nontender.  Pelvis stable nontender.  No bony tenderness to the upper extremities.  Skin:    General: Skin is warm.     Capillary Refill: Capillary refill takes less than 2 seconds.  Neurological:     Mental Status: She is alert. Mental status is at baseline.  Psychiatric:        Mood and Affect: Mood normal.        Behavior: Behavior normal.     ED Results / Procedures / Treatments   Labs (all labs ordered are listed, but only abnormal results are displayed) Labs Reviewed  CBC - Abnormal; Notable for the following components:      Result Value   RBC 3.46 (*)    Hemoglobin 11.2 (*)    HCT 34.1 (*)    All other components within normal limits  COMPREHENSIVE METABOLIC PANEL WITH GFR - Abnormal; Notable for the following components:   Total Protein 5.9 (*)    All other components within normal limits  URINALYSIS, ROUTINE W REFLEX MICROSCOPIC - Abnormal; Notable for the following components:   APPearance CLOUDY (*)    Protein, ur 30 (*)    Leukocytes,Ua MODERATE (*)    Bacteria, UA RARE (*)    All other components within normal limits  TROPONIN I (HIGH SENSITIVITY)    EKG None  Radiology DG Chest 1 View Result Date: 02/22/2024 CLINICAL DATA:  106001 Syncope 106001 falls. EXAM: CHEST  1 VIEW COMPARISON:  Chest x-ray 02/15/2024, CT chest 06/27/2021 FINDINGS: The heart and mediastinal contours are unchanged. Atherosclerotic plaque. Biapical pleural/pulmonary scarring. No focal consolidation. No pulmonary edema. No pleural effusion. No pneumothorax. No acute osseous abnormality. Neural stimulator leads overlying the mid to lower thoracic level. IMPRESSION: 1. No active disease. 2.  Aortic Atherosclerosis (ICD10-I70.0). Electronically Signed   By: Morgane  Naveau M.D.   On: 02/22/2024 20:18    DG Lumbar Spine 2-3 Views Result Date: 02/22/2024 CLINICAL DATA:  low back pain; recent falls EXAM: LUMBAR SPINE - 2-3 VIEW COMPARISON:  X-ray lumbar spine 06/27/2022 FINDINGS: Limited evaluation due to overlapping osseous structures and overlying soft tissues. Multilevel moderate severe degenerative changes of the spine. Multilevel mild to severe intervertebral disc space narrowing. There is no evidence of lumbar spine fracture. Alignment is grossly unremarkable. Chronic vertebral body height loss of the T11-T12 levels. Atherosclerotic plaque. Left hip arthroplasty partially visualized. Right  flank neural stimulator with leads coursing cranially along the thoracic spine. IMPRESSION: 1. No acute displaced fracture or traumatic listhesis of the lumbar spine. Limited evaluation due to overlapping osseous structures and overlying soft tissues. 2.  Aortic Atherosclerosis (ICD10-I70.0). Electronically Signed   By: Morgane  Naveau M.D.   On: 02/22/2024 20:16   CT Head Wo Contrast Result Date: 02/22/2024 CLINICAL DATA:  Head trauma, multiple pin can occult falls at home. EXAM: CT HEAD WITHOUT CONTRAST TECHNIQUE: Contiguous axial images were obtained from the base of the skull through the vertex without intravenous contrast. RADIATION DOSE REDUCTION: This exam was performed according to the departmental dose-optimization program which includes automated exposure control, adjustment of the mA and/or kV according to patient size and/or use of iterative reconstruction technique. COMPARISON:  02/15/2024. FINDINGS: Brain: No acute intracranial hemorrhage. No CT evidence of acute infarct. Nonspecific hypoattenuation in the periventricular and subcortical white matter favored to reflect chronic microvascular ischemic changes. No edema, mass effect, or midline shift. The basilar cisterns are patent. Ventricles: The ventricles are normal. Vascular: No hyperdense vessel or unexpected calcification. Skull: No acute or  aggressive finding. Chronic appearing deformity of the left nasal bone. Orbits: Orbits are symmetric. Sinuses: Mild mucosal thickening in the ethmoid sinuses. Other: Mastoid air cells are clear. IMPRESSION: No CT evidence of acute intracranial abnormality. Mild chronic microvascular ischemic changes. Chronic appearing left nasal bone deformity. Electronically Signed   By: Denny Flack M.D.   On: 02/22/2024 20:03    Procedures Procedures    Medications Ordered in ED Medications  lidocaine  (LIDODERM ) 5 % 1 patch (1 patch Transdermal Patch Applied 02/22/24 2040)  acetaminophen  (TYLENOL ) tablet 1,000 mg (1,000 mg Oral Given 02/22/24 2039)    ED Course/ Medical Decision Making/ A&P                                 Medical Decision Making This is an 83 year old female presenting emergency department after multiple falls today.  She is afebrile nontachycardic, maintaining oxygen  saturation on room air.  Hemodynamically stable.  Reassuring exam with no localizing deficits.  Daughter at bedside states that patient is at her baseline mentation.  Patient is a poor historian, unclear if she passed out or mechanical fall.  EKG normal sinus rhythm without ST segment changes to get ischemia.  Troponin negative.  ACS unlikely.  No leukocytosis, fever or tachycardia to suggest infectious process.  Does have a minor anemia.  No overt sources of blood loss.  Normal kidney function.  No transaminitis to suggest hepatobiliary disease.  Does appear to have some evidence of urinary tract infection.  CT head without intracranial hemorrhage.  Lumbar spine without fracture.  Chest x-ray without pneumonia pneumothorax.  Shared decision making with daughter at bedside for admission for near syncope/cardiac workup.  Offered admission, but patient would like to follow-up outpatient with cardiology referral.  Will give antibiotics for urinary tract infection.  Stable for discharge at this time.  Amount and/or Complexity of Data  Reviewed Labs: ordered. Radiology: ordered.  Risk OTC drugs. Prescription drug management.         Final Clinical Impression(s) / ED Diagnoses Final diagnoses:  None    Rx / DC Orders ED Discharge Orders     None         Rolinda Climes, DO 02/22/24 2311

## 2024-02-22 NOTE — ED Notes (Signed)
 Patient transported to CT

## 2024-02-22 NOTE — Telephone Encounter (Signed)
 Pt daughter called in regards to pt medication was to  much and they are trying to fine a way to calm her down . Daughter has stated mother continues to fall and is not sure if the medication is working  . Pt daughter want to speak to nurse to see if medication can get changed or what could she do to help Pt.

## 2024-02-22 NOTE — Telephone Encounter (Signed)
 Call to daughter, no answer. Left message to return call

## 2024-02-22 NOTE — ED Triage Notes (Signed)
 Patient BIB [facility] from home c/o multiple mechanical falls at home with with bilateral leg pain.  Patient lives at home with daughter, history of dementia and falls, per daughter, patient is at baseline.  Patient's daughter denies head injury and LOC.  Patient has history of chronic pain.  120/70 66 HR 98% RA 89 CBG

## 2024-02-22 NOTE — Discharge Instructions (Addendum)
 Please follow-up with your primary doctor.  We are referring you to cardiology.  They should call to schedule an appointment.  You may also follow-up with your primary doctor.  We are prescribing antibiotics for urinary tract infection.  Please take them as prescribed for the full course.

## 2024-02-26 ENCOUNTER — Emergency Department (HOSPITAL_BASED_OUTPATIENT_CLINIC_OR_DEPARTMENT_OTHER)

## 2024-02-26 ENCOUNTER — Emergency Department (HOSPITAL_BASED_OUTPATIENT_CLINIC_OR_DEPARTMENT_OTHER): Admission: EM | Admit: 2024-02-26 | Discharge: 2024-02-26 | Disposition: A | Discharge status: Home / Self Care

## 2024-02-26 ENCOUNTER — Encounter (HOSPITAL_BASED_OUTPATIENT_CLINIC_OR_DEPARTMENT_OTHER): Payer: Self-pay | Admitting: Emergency Medicine

## 2024-02-26 ENCOUNTER — Other Ambulatory Visit: Payer: Self-pay

## 2024-02-26 DIAGNOSIS — M25511 Pain in right shoulder: Secondary | ICD-10-CM | POA: Insufficient documentation

## 2024-02-26 DIAGNOSIS — W01198A Fall on same level from slipping, tripping and stumbling with subsequent striking against other object, initial encounter: Secondary | ICD-10-CM | POA: Insufficient documentation

## 2024-02-26 DIAGNOSIS — I672 Cerebral atherosclerosis: Secondary | ICD-10-CM | POA: Insufficient documentation

## 2024-02-26 DIAGNOSIS — N189 Chronic kidney disease, unspecified: Secondary | ICD-10-CM | POA: Insufficient documentation

## 2024-02-26 DIAGNOSIS — Z7982 Long term (current) use of aspirin: Secondary | ICD-10-CM | POA: Insufficient documentation

## 2024-02-26 DIAGNOSIS — I129 Hypertensive chronic kidney disease with stage 1 through stage 4 chronic kidney disease, or unspecified chronic kidney disease: Secondary | ICD-10-CM | POA: Insufficient documentation

## 2024-02-26 DIAGNOSIS — S0003XA Contusion of scalp, initial encounter: Secondary | ICD-10-CM | POA: Insufficient documentation

## 2024-02-26 HISTORY — DX: Unspecified dementia, unspecified severity, without behavioral disturbance, psychotic disturbance, mood disturbance, and anxiety: F03.90

## 2024-02-26 MED ORDER — DICLOFENAC SODIUM 1 % EX GEL: 2.0000 g | Freq: Four times a day (QID) | CUTANEOUS | 1 refills | Status: DC

## 2024-02-26 MED ORDER — ACETAMINOPHEN 500 MG PO TABS
1000.0000 mg | ORAL_TABLET | Freq: Once | ORAL | Status: AC
  Administered 2024-02-26: 1000 mg via ORAL
  Filled 2024-02-26: qty 2

## 2024-02-26 NOTE — ED Provider Notes (Signed)
 [place] EMERGENCY DEPARTMENT AT [facility] Provider Note   CSN: [id number] Arrival date & time: 02/26/24  9562     History  No chief complaint on file.   [name] is a 83 y.o. female.  83 year old female with past medical history of hypertension, hyperlipidemia, chronic kidney disease presenting to the emergency department today after a fall yesterday.  The patient states that she tripped and fell in her garage yesterday.  She thinks that she may have hit her head but did not lose consciousness and did not hit it hard.  She is not on any blood thinners.  She states that she is having pain mostly over her right shoulder blade and right lateral chest wall.  Denies any difficulty breathing.  She denies any pain in her extremities.  She came to the ER today for further evaluation regarding this.        Home Medications Prior to Admission medications   Medication Sig Start Date End Date Taking? Authorizing Provider  diclofenac Sodium (VOLTAREN) 1 % GEL Apply 2 g topically 4 (four) times daily. 02/26/24  Yes Carin Charleston, MD  acetaminophen  (TYLENOL ) 500 MG tablet Take 500 mg by mouth every 6 (six) hours as needed for moderate pain.    [provider]  aspirin  81 MG tablet Take 81 mg by mouth in the morning.    [provider]  Brexpiprazole  (REXULTI ) 0.5 MG TABS Take 1 tablet (0.5 mg total) by mouth at bedtime. 02/15/24   Wess Hammed, NP  calcium  carbonate (TUMS - DOSED IN MG ELEMENTAL CALCIUM ) 500 MG chewable tablet Chew 1 tablet by mouth as needed for indigestion or heartburn.    [provider]  Cholecalciferol  (VITAMIN D ) 125 MCG (5000 UT) CAPS Take 5,000 Units by mouth daily.    [provider]  doxazosin  (CARDURA ) 8 MG tablet Take 8 mg by mouth daily.    [provider]  ferrous sulfate  324 MG TBEC Take 324 mg by mouth in the morning.    [provider]  fluticasone (FLONASE) 50 MCG/ACT nasal spray Place 1  spray into both nostrils daily as needed for allergies or rhinitis.    [provider]  gabapentin  (NEURONTIN ) 100 MG capsule Take 100 mg by mouth 2 (two) times daily.    [provider]  isosorbide  mononitrate (IMDUR ) 60 MG 24 hr tablet Take 60 mg by mouth daily.    [provider]  levothyroxine  (SYNTHROID ) 75 MCG tablet Take 75 mcg by mouth in the morning. 12/18/20   [provider]  LORazepam  (ATIVAN ) 1 MG tablet TAKE 1 TABLET BY MOUTH EVERY 8 HOURS AS NEEDED. 01/12/24   [name], MD  Multiple Vitamin (MULTIVITAMIN) tablet Take 1 tablet by mouth every morning.    [provider]  nitrofurantoin , macrocrystal-monohydrate, (MACROBID ) 100 MG capsule Take 1 capsule (100 mg total) by mouth 2 (two) times daily for 5 days. 02/22/24 02/27/24  Rolinda Climes, DO  ondansetron  (ZOFRAN -ODT) 8 MG disintegrating tablet Take 1 tablet (8 mg total) by mouth every 8 (eight) hours as needed for nausea or vomiting. 06/11/22   [name], MD  QUEtiapine  (SEROQUEL ) 25 MG tablet TAKE 1 TABLET BY MOUTH EACH MORNING, 1 TABLET IN THE AFTERNOON , AND 3 TABLETS AT BEDTIME Patient taking differently: Take 25-75 mg by mouth See admin instructions. TAKE 1 TABLET BY MOUTH EACH MORNING, 1 TABLET IN THE AFTERNOON , AND 3 TABLETS AT BEDTIME 11/25/23   Gracie Lav,  Murvin Arthurs, MD  sertraline  (ZOLOFT ) 100 MG tablet Take 200 mg by mouth in the morning.    [provider]  simvastatin  (ZOCOR ) 40 MG tablet Take 40 mg by mouth at bedtime. 10/31/20   [provider]      Allergies    Nsaids    Review of Systems   Review of Systems  Musculoskeletal:  Positive for arthralgias. Negative for neck pain.  All other systems reviewed and are negative.   Physical Exam Updated Vital Signs BP 121/68 (BP Location: Right Arm)   Pulse 64   Temp 97.7 F (36.5 C) (Oral)   Resp 16   SpO2 96%  Physical Exam Vitals and nursing note reviewed.   Gen: NAD Eyes: PERRL, EOMI HEENT: no  oropharyngeal swelling Neck: trachea midline Resp: clear to auscultation bilaterally, the patient is tender over the right lateral chest wall with no crepitus or obvious deformities or bruising noted Card: RRR, no murmurs, rubs, or gallops Abd: nontender, nondistended Extremities: no calf tenderness, no edema MSK: The patient is tender over the right scapula with no obvious bruising or deformity noted, the remainder of the extremities are atraumatic Vascular: 2+ radial pulses bilaterally, 2+ DP pulses bilaterally Skin: no rashes Psyc: acting appropriately    ED Results / Procedures / Treatments   Labs (all labs ordered are listed, but only abnormal results are displayed) Labs Reviewed - No data to display  EKG None  Radiology DG Chest Portable 1 View Result Date: 02/26/2024 CLINICAL DATA:  RIGHT shoulder pain status post fall yesterday EXAM: PORTABLE CHEST - 1 VIEW COMPARISON:  02/22/2024 FINDINGS: Cardiomediastinal silhouette and pulmonary vasculature are within normal limits. Mild hazy opacity present at the RIGHT lung base. Lungs otherwise clear. Neurostimulator leads seen terminating in the midthoracic spine. IMPRESSION: Mild hazy RIGHT basilar opacity favored to be atelectasis, rather than pneumonia. Electronically Signed   By: Elester Grim M.D.   On: 02/26/2024 11:08   DG Shoulder Right Result Date: 02/26/2024 CLINICAL DATA:  RIGHT shoulder pain status post fall yesterday EXAM: RIGHT SHOULDER - 2+ VIEW COMPARISON:  None available FINDINGS: No fracture, dislocation, or soft tissue abnormality. IMPRESSION: No acute radiographic abnormality of the RIGHT shoulder. Electronically Signed   By: Elester Grim M.D.   On: 02/26/2024 11:05   CT Head Wo Contrast Result Date: 02/26/2024 CLINICAL DATA:  83 year old female status post fall yesterday and parotid striking head. EXAM: CT HEAD WITHOUT CONTRAST TECHNIQUE: Contiguous axial images were obtained from the base of the skull through the  vertex without intravenous contrast. RADIATION DOSE REDUCTION: This exam was performed according to the departmental dose-optimization program which includes automated exposure control, adjustment of the mA and/or kV according to patient size and/or use of iterative reconstruction technique. COMPARISON:  Head CT 02/22/2024. FINDINGS: Brain: Cerebral volume is stable and normal for age. No midline shift, ventriculomegaly, mass effect, evidence of mass lesion, intracranial hemorrhage or evidence of cortically based acute infarction. Stable gray-white differentiation, more moderate patchy cerebral white matter hypodensity. Vascular: No suspicious intracranial vascular hyperdensity. Calcified atherosclerosis at the skull base. Skull: Appears stable and intact. No acute osseous abnormality identified. Sinuses/Orbits: Visualized paranasal sinuses and mastoids are stable and well aerated. Other: Right posterosuperior convexity scalp soft tissue swelling compatible with hematoma/contusion. Underlying calvarium appears intact. No scalp soft tissue gas. Stable orbits soft tissues. IMPRESSION: 1. Right posterosuperior convexity scalp hematoma/contusion. No skull fracture. 2. No acute intracranial abnormality. 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 Electronically Signed  By: Marlise Simpers M.D.   On: 02/26/2024 10:38    Procedures Procedures    Medications Ordered in ED Medications  acetaminophen  (TYLENOL ) tablet 1,000 mg (1,000 mg Oral Given 02/26/24 1031)    ED Course/ Medical Decision Making/ A&P                                 Medical Decision Making 83 year old female with past medical history of chronic kidney disease, hypertension, and hyperlipidemia presenting to the emergency department today with predominant pain over her right scapula and chest wall after a mechanical fall at home yesterday.  I will further evaluate the patient here with a chest x-ray as well as a shoulder x-ray to  evaluate for acute fracture or pneumothorax.  Will obtain a CT scan of her head given her age.  She is denying any pain in the center of her back or neck.  Does not have any tenderness here on exam so I do not think that imaging of these is warranted.  I will give the patient Tylenol  for her pain and reevaluate for ultimate disposition.  The patient's CT scan and x-rays are reassuring.  She is discharged with return precautions.  Amount and/or Complexity of Data Reviewed Radiology: ordered.  Risk OTC drugs.           Final Clinical Impression(s) / ED Diagnoses Final diagnoses:  Acute pain of right shoulder    Rx / DC Orders ED Discharge Orders          Ordered    diclofenac Sodium (VOLTAREN) 1 % GEL  4 times daily        02/26/24 1119              Carin Charleston, MD 02/26/24 1120

## 2024-02-26 NOTE — Discharge Instructions (Signed)
 Your workup today was reassuring.  Use the Voltaren gel as needed for pain.  You may also take 1000 mg of Tylenol  every 6 hours.  Please follow-up with your doctor and return to the ER for worsening symptoms.

## 2024-02-26 NOTE — ED Triage Notes (Signed)
 Pt fell in her garage yesterday, "roll", not a hard fall onto the floor. Fell on her left side, hit head on floor. No LOC or confusion. Right side back pain today.

## 2024-02-26 NOTE — ED Notes (Signed)
 DC paperwork given and verbally understood.

## 2024-02-28 ENCOUNTER — Telehealth: Payer: Self-pay | Admitting: Neurology

## 2024-02-28 DIAGNOSIS — F03918 Unspecified dementia, unspecified severity, with other behavioral disturbance: Secondary | ICD-10-CM

## 2024-02-28 NOTE — Telephone Encounter (Signed)
 Pt's daughter called requesting to speak to the nurse regarding the pt's QUEtiapine  (SEROQUEL ) 25 MG tablet and the frequent falls she has been having. Daughter did not want to go into much more details she only want to speak to the nurse.  Please advise.

## 2024-02-28 NOTE — Telephone Encounter (Signed)
 Call to daughter, no answer. Left message to call back

## 2024-02-29 NOTE — Telephone Encounter (Signed)
 Referral for psychiatry fax to [facility]. Phone: [phone], Fax: [phone]

## 2024-02-29 NOTE — Addendum Note (Signed)
 Addended by: Wess Hammed on: 02/29/2024 04:33 PM   Modules accepted: Orders

## 2024-02-29 NOTE — Addendum Note (Signed)
 Addended by: Norina Cowper L on: 02/29/2024 10:14 AM   Modules accepted: Orders

## 2024-02-29 NOTE — Telephone Encounter (Addendum)
 I called the patient's daughter. Having difficulty keeping patient asleep at night. She is falling often, Ativan  makes her legs weak. ER visits 5/20, 5/27, 5/31 for falls. Recently had UTI, treated with antibiotic. Rexulti  was too expensive. I referred her to geriatric psych, referral was sent to [facility] in [place]. I called them, reports they don't take her insurance, recommend we try [facility] or [facility]. They are still trying to get her to nursing home. Advised to try melatonin 5 mg at bedtime for sleep difficulty, avoid Ativan .  Advised on high dose Seroquel , antipsychotics.  Blackbox warning.  Also on Zoloft  200 mg daily.  Limited medication option.I will resend orders for psych.

## 2024-02-29 NOTE — Telephone Encounter (Signed)
 Pt returned phone call, would like a call back.

## 2024-02-29 NOTE — Telephone Encounter (Signed)
 Call to daughter [name], she states [name] is having multiple falls and not sleeping at night and can't keep her still. She has had multiple ER visits and antibiotic for UTI. The sleeping has been an issue before the UTI per daughter. They could not afford rexulti . Patient is taking 0.5 mg ativan  as needed because family thinks it makes her legs weaker. She is taking 25 mg seroquel  in the morning, 50 mg in the afternoon and 75 mg at bedtime. Family needs something to help her sleep as she is up all night. Advised I would send to sarah for recommendations

## 2024-03-13 NOTE — Telephone Encounter (Signed)
 Called Pt  no Answer LVM TO CALL OFFICE BACK

## 2024-03-13 NOTE — Telephone Encounter (Signed)
 Faxed Received  on 03-11-2024 from  [facility]  in regards to trying to reach Pt but was unsuccessful.    Advised office to give pt a call to get scheduled   Office hours  Monday-Friday  8 am -5 pm   Callback # [phone]

## 2024-03-14 ENCOUNTER — Ambulatory Visit: Admitting: Podiatry

## 2024-04-18 ENCOUNTER — Other Ambulatory Visit: Payer: Self-pay | Admitting: Neurology

## 2024-05-18 ENCOUNTER — Ambulatory Visit: Admitting: Podiatry

## 2024-05-25 ENCOUNTER — Ambulatory Visit: Admitting: Podiatry

## 2024-06-01 ENCOUNTER — Other Ambulatory Visit: Payer: Self-pay | Admitting: Neurology

## 2024-06-01 NOTE — Telephone Encounter (Signed)
 Requested Prescriptions   Pending Prescriptions Disp Refills   LORazepam  (ATIVAN ) 1 MG tablet [Pharmacy Med Name: LORAZEPAM  1 MG TABLET] 90 tablet     Sig: TAKE 1 TABLET BY MOUTH EVERY 8 HOURS AS NEEDED   Last seen 02/15/24 Next appt 08/21/24 Dispenses   Dispensed Days Supply Quantity Provider Pharmacy  LORAZEPAM  1 MG TABLET 04/30/2024 30 90 each Onita Duos, MD [facility] [phone] - S...  LORAZEPAM  1 MG TABLET 03/16/2024 30 90 each Onita Duos, MD [facility] [phone] - S...  LORAZEPAM  1 MG TABLET 02/14/2024 30 90 each Onita Duos, MD [facility] [phone] - S...  LORAZEPAM  1 MG TABLET 01/12/2024 30 90 each Onita Duos, MD [facility] [phone] - S...  LORAZEPAM  1 MG TABLET 12/13/2023 30 90 each Onita Duos, MD [facility] [phone] - S...  LORAZEPAM  1 MG TABLET 11/11/2023 30 90 each Onita Duos, MD [facility] [phone] - S...  LORAZEPAM  1 MG TABLET 10/14/2023 30 90 each Onita Duos, MD [facility] [phone] - S...  LORAZEPAM  1 MG TABLET 09/16/2023 30 90 each Onita Duos, MD [facility] [phone] - S...  LORAZEPAM  1 MG TABLET 08/17/2023 30 90 each Onita Duos, MD [facility] [phone] - S...  LORAZEPAM  1 MG TABLET 07/20/2023 30 90 each Onita Duos, MD [facility] [phone] - S...  LORAZEPAM  2 MG TABLET 06/28/2023 30 30 each Onita Duos, MD [facility] [phone] - S...  LORAZEPAM  2 MG TABLET 06/24/2023 30 15 each Onita Duos, MD [facility] [phone] - [place]

## 2024-06-15 ENCOUNTER — Telehealth: Payer: Self-pay | Admitting: Neurology

## 2024-06-15 ENCOUNTER — Other Ambulatory Visit (HOSPITAL_BASED_OUTPATIENT_CLINIC_OR_DEPARTMENT_OTHER): Payer: Self-pay

## 2024-06-15 NOTE — Telephone Encounter (Signed)
 1st attempt unable to leave msg by hf 06/15/24

## 2024-06-15 NOTE — Telephone Encounter (Signed)
 Add to virtual visit at end of day to discuss with her daughter

## 2024-06-15 NOTE — Telephone Encounter (Signed)
 RETURNED CALL TO [name] who stated that she has progressed to being very angry, running around, combative, not sleeping, frequent uti. Pt daughter stated that she has stopped giving ativan  because she gets more angry and will get more combative. Pt daughter wanted to ask if xanax or clonazepam would be more efficient as oppose to ativan . The daughter would also want to know if they could give CBD gummies and wanted to know if that could calm her down. I stated that I would ask provider.

## 2024-06-15 NOTE — Telephone Encounter (Signed)
 Called and was able to schedule for an in office visit as they aren't set up on mychart and the pts daughter voiced gratitude and understanding of all discussed

## 2024-06-15 NOTE — Telephone Encounter (Signed)
 Pt's daughter reports pt has not been able to sleep for about 3 nights, they no longer give pt LORazepam  (ATIVAN ) 0.5 MG tablet because of how it makes pt feel, please call daughter(on DPR)

## 2024-06-19 ENCOUNTER — Ambulatory Visit: Admitting: Neurology

## 2024-06-19 ENCOUNTER — Encounter: Payer: Self-pay | Admitting: Neurology

## 2024-06-26 NOTE — Telephone Encounter (Signed)
 Pt's daughter is asking for a call from RN to see if there is anything that can offered for an earlier appointment.  Phone rep looked to see if an earlier appointment was available, there currently is not. Phone rep informed daughter pt is on wait list, she is still asking for a call from CMA

## 2024-06-26 NOTE — Telephone Encounter (Signed)
 Returned call to daughter who stated that she needed an in office appt sooner. Offered tomorrow for online appt and sent link to activate mychart per daughter request. Daughter advised to contact us  if they are unable to set up and we can switch to a video appt. Daughter voiced gratitude and understanding

## 2024-06-26 NOTE — Telephone Encounter (Signed)
 Returned call to daughter and stated that the pt really needs to be seen in the office and we don't have any upcoming appts and would have to place her on the cancellation list. Pt voiced gratitude and understanding

## 2024-06-26 NOTE — Progress Notes (Signed)
 SW reviewed the chart and see hospice referral entered 06/20/2024 with instructions to call back clinic if they don't hear from anyone in the next few days.   Rolin Lesches CHESS SW PH: [phone] Fax: [phone] [email or web address]

## 2024-06-30 NOTE — Progress Notes (Signed)
 SW fu call to Jaylene at [phone]. Got VM and left a message for patient's daughter asking for VM confirmation of whether hospice has been in touch with them.   Rolin Lesches CHESS SW PH: [phone] Fax: [phone] [email or web address]

## 2024-07-10 ENCOUNTER — Inpatient Hospital Stay (HOSPITAL_COMMUNITY)
Admission: EM | Admit: 2024-07-10 | Discharge: 2024-08-03 | DRG: 884 | Disposition: A | Discharge status: Home Health | Attending: Family Medicine | Admitting: Family Medicine

## 2024-07-10 ENCOUNTER — Encounter (HOSPITAL_COMMUNITY): Payer: Self-pay | Admitting: Internal Medicine

## 2024-07-10 ENCOUNTER — Emergency Department (HOSPITAL_COMMUNITY)

## 2024-07-10 DIAGNOSIS — S40011A Contusion of right shoulder, initial encounter: Secondary | ICD-10-CM | POA: Diagnosis present

## 2024-07-10 DIAGNOSIS — Z96642 Presence of left artificial hip joint: Secondary | ICD-10-CM | POA: Diagnosis present

## 2024-07-10 DIAGNOSIS — S5011XA Contusion of right forearm, initial encounter: Secondary | ICD-10-CM | POA: Diagnosis present

## 2024-07-10 DIAGNOSIS — Z8659 Personal history of other mental and behavioral disorders: Secondary | ICD-10-CM

## 2024-07-10 DIAGNOSIS — R4689 Other symptoms and signs involving appearance and behavior: Principal | ICD-10-CM

## 2024-07-10 DIAGNOSIS — E7849 Other hyperlipidemia: Secondary | ICD-10-CM

## 2024-07-10 DIAGNOSIS — S40012A Contusion of left shoulder, initial encounter: Secondary | ICD-10-CM | POA: Diagnosis present

## 2024-07-10 DIAGNOSIS — Z9181 History of falling: Secondary | ICD-10-CM

## 2024-07-10 DIAGNOSIS — S0083XA Contusion of other part of head, initial encounter: Secondary | ICD-10-CM | POA: Diagnosis present

## 2024-07-10 DIAGNOSIS — Z781 Physical restraint status: Secondary | ICD-10-CM

## 2024-07-10 DIAGNOSIS — K449 Diaphragmatic hernia without obstruction or gangrene: Secondary | ICD-10-CM | POA: Diagnosis present

## 2024-07-10 DIAGNOSIS — R4182 Altered mental status, unspecified: Secondary | ICD-10-CM | POA: Diagnosis present

## 2024-07-10 DIAGNOSIS — R296 Repeated falls: Secondary | ICD-10-CM | POA: Diagnosis present

## 2024-07-10 DIAGNOSIS — R2681 Unsteadiness on feet: Secondary | ICD-10-CM | POA: Diagnosis present

## 2024-07-10 DIAGNOSIS — R410 Disorientation, unspecified: Secondary | ICD-10-CM | POA: Diagnosis not present

## 2024-07-10 DIAGNOSIS — R269 Unspecified abnormalities of gait and mobility: Secondary | ICD-10-CM | POA: Diagnosis not present

## 2024-07-10 DIAGNOSIS — M21372 Foot drop, left foot: Secondary | ICD-10-CM | POA: Diagnosis present

## 2024-07-10 DIAGNOSIS — Z87440 Personal history of urinary (tract) infections: Secondary | ICD-10-CM

## 2024-07-10 DIAGNOSIS — Z9842 Cataract extraction status, left eye: Secondary | ICD-10-CM

## 2024-07-10 DIAGNOSIS — I2510 Atherosclerotic heart disease of native coronary artery without angina pectoris: Secondary | ICD-10-CM | POA: Diagnosis present

## 2024-07-10 DIAGNOSIS — Z66 Do not resuscitate: Secondary | ICD-10-CM | POA: Diagnosis present

## 2024-07-10 DIAGNOSIS — G9341 Metabolic encephalopathy: Secondary | ICD-10-CM | POA: Diagnosis present

## 2024-07-10 DIAGNOSIS — N200 Calculus of kidney: Secondary | ICD-10-CM | POA: Diagnosis present

## 2024-07-10 DIAGNOSIS — Z888 Allergy status to other drugs, medicaments and biological substances status: Secondary | ICD-10-CM

## 2024-07-10 DIAGNOSIS — Z9682 Presence of neurostimulator: Secondary | ICD-10-CM

## 2024-07-10 DIAGNOSIS — Z87442 Personal history of urinary calculi: Secondary | ICD-10-CM

## 2024-07-10 DIAGNOSIS — Z8673 Personal history of transient ischemic attack (TIA), and cerebral infarction without residual deficits: Secondary | ICD-10-CM

## 2024-07-10 DIAGNOSIS — E785 Hyperlipidemia, unspecified: Secondary | ICD-10-CM | POA: Diagnosis present

## 2024-07-10 DIAGNOSIS — F03918 Unspecified dementia, unspecified severity, with other behavioral disturbance: Secondary | ICD-10-CM | POA: Diagnosis not present

## 2024-07-10 DIAGNOSIS — Z79890 Hormone replacement therapy (postmenopausal): Secondary | ICD-10-CM

## 2024-07-10 DIAGNOSIS — Z79899 Other long term (current) drug therapy: Secondary | ICD-10-CM

## 2024-07-10 DIAGNOSIS — Z751 Person awaiting admission to adequate facility elsewhere: Secondary | ICD-10-CM

## 2024-07-10 DIAGNOSIS — W06XXXA Fall from bed, initial encounter: Secondary | ICD-10-CM | POA: Diagnosis present

## 2024-07-10 DIAGNOSIS — F0153 Vascular dementia, unspecified severity, with mood disturbance: Secondary | ICD-10-CM | POA: Diagnosis not present

## 2024-07-10 DIAGNOSIS — Z7982 Long term (current) use of aspirin: Secondary | ICD-10-CM

## 2024-07-10 DIAGNOSIS — F01518 Vascular dementia, unspecified severity, with other behavioral disturbance: Secondary | ICD-10-CM | POA: Diagnosis present

## 2024-07-10 DIAGNOSIS — Z515 Encounter for palliative care: Secondary | ICD-10-CM

## 2024-07-10 DIAGNOSIS — Z8249 Family history of ischemic heart disease and other diseases of the circulatory system: Secondary | ICD-10-CM

## 2024-07-10 DIAGNOSIS — Z9841 Cataract extraction status, right eye: Secondary | ICD-10-CM

## 2024-07-10 DIAGNOSIS — I129 Hypertensive chronic kidney disease with stage 1 through stage 4 chronic kidney disease, or unspecified chronic kidney disease: Secondary | ICD-10-CM | POA: Diagnosis present

## 2024-07-10 DIAGNOSIS — G8929 Other chronic pain: Secondary | ICD-10-CM | POA: Diagnosis present

## 2024-07-10 LAB — URINALYSIS, W/ REFLEX TO CULTURE (INFECTION SUSPECTED)
Bacteria, UA: NONE SEEN
Bilirubin Urine: NEGATIVE
Glucose, UA: NEGATIVE mg/dL
Hgb urine dipstick: NEGATIVE
Ketones, ur: NEGATIVE mg/dL
Leukocytes,Ua: NEGATIVE
Nitrite: NEGATIVE
Protein, ur: NEGATIVE mg/dL
Specific Gravity, Urine: 1.024 (ref 1.005–1.030)
pH: 5 (ref 5.0–8.0)

## 2024-07-10 LAB — COMPREHENSIVE METABOLIC PANEL WITH GFR
ALT: 19 U/L (ref 0–44)
AST: 26 U/L (ref 15–41)
Albumin: 3.8 g/dL (ref 3.5–5.0)
Alkaline Phosphatase: 110 U/L (ref 38–126)
Anion gap: 9 (ref 5–15)
BUN: 20 mg/dL (ref 8–23)
CO2: 26 mmol/L (ref 22–32)
Calcium: 9.2 mg/dL (ref 8.9–10.3)
Chloride: 105 mmol/L (ref 98–111)
Creatinine, Ser: 0.76 mg/dL (ref 0.44–1.00)
GFR, Estimated: >60 mL/min (ref >60)
Glucose, Bld: 91 mg/dL (ref 70–99)
Potassium: 3.9 mmol/L (ref 3.5–5.1)
Sodium: 140 mmol/L (ref 135–145)
Total Bilirubin: 0.5 mg/dL (ref 0.0–1.2)
Total Protein: 6.3 g/dL — ABNORMAL LOW (ref 6.5–8.1)

## 2024-07-10 LAB — CBC WITH DIFFERENTIAL/PLATELET
Abs Immature Granulocytes: 0.02 K/uL (ref 0.00–0.07)
Basophils Absolute: 0 K/uL (ref 0.0–0.1)
Basophils Relative: 0 %
Eosinophils Absolute: 0.1 K/uL (ref 0.0–0.5)
Eosinophils Relative: 2 %
HCT: 36 % (ref 36.0–46.0)
Hemoglobin: 11.9 g/dL — ABNORMAL LOW (ref 12.0–15.0)
Immature Granulocytes: 0 %
Lymphocytes Relative: 29 %
Lymphs Abs: 1.6 K/uL (ref 0.7–4.0)
MCH: 32.6 pg (ref 26.0–34.0)
MCHC: 33.1 g/dL (ref 30.0–36.0)
MCV: 98.6 fL (ref 80.0–100.0)
Monocytes Absolute: 0.4 K/uL (ref 0.1–1.0)
Monocytes Relative: 7 %
Neutro Abs: 3.5 K/uL (ref 1.7–7.7)
Neutrophils Relative %: 62 %
Platelets: 158 K/uL (ref 150–400)
RBC: 3.65 MIL/uL — ABNORMAL LOW (ref 3.87–5.11)
RDW: 12.2 % (ref 11.5–15.5)
WBC: 5.6 K/uL (ref 4.0–10.5)
nRBC: 0 % (ref 0.0–0.2)

## 2024-07-10 LAB — LIPASE, BLOOD: Lipase: 31 U/L (ref 11–51)

## 2024-07-10 MED ORDER — SIMVASTATIN 20 MG PO TABS
40.0000 mg | ORAL_TABLET | Freq: Every day | ORAL | Status: DC
  Administered 2024-07-11 – 2024-08-02 (×19): 40 mg via ORAL
  Filled 2024-07-10 (×21): qty 2

## 2024-07-10 MED ORDER — ACETAMINOPHEN 650 MG RE SUPP: 650.0000 mg | Freq: Four times a day (QID) | RECTAL | Status: DC | PRN

## 2024-07-10 MED ORDER — LORAZEPAM 1 MG PO TABS: 0.5000 mg | ORAL_TABLET | Freq: Four times a day (QID) | ORAL | Status: DC | PRN

## 2024-07-10 MED ORDER — SODIUM CHLORIDE 0.9% FLUSH: 3.0000 mL | INTRAVENOUS | Status: DC | PRN

## 2024-07-10 MED ORDER — SERTRALINE HCL 100 MG PO TABS
200.0000 mg | ORAL_TABLET | Freq: Every day | ORAL | Status: DC
  Administered 2024-07-11 – 2024-08-03 (×20): 200 mg via ORAL
  Filled 2024-07-10 (×23): qty 2

## 2024-07-10 MED ORDER — ASPIRIN 81 MG PO CHEW
81.0000 mg | CHEWABLE_TABLET | Freq: Every day | ORAL | Status: DC
  Administered 2024-07-11 – 2024-08-03 (×20): 81 mg via ORAL
  Filled 2024-07-10 (×23): qty 1

## 2024-07-10 MED ORDER — ISOSORBIDE MONONITRATE ER 60 MG PO TB24
60.0000 mg | ORAL_TABLET | Freq: Every day | ORAL | Status: DC
Start: 2024-07-11 — End: 2024-08-03
  Administered 2024-07-11 – 2024-08-03 (×20): 60 mg via ORAL
  Filled 2024-07-10 (×23): qty 1

## 2024-07-10 MED ORDER — ONDANSETRON HCL 4 MG/2ML IJ SOLN: 4.0000 mg | Freq: Four times a day (QID) | INTRAMUSCULAR | Status: DC | PRN

## 2024-07-10 MED ORDER — SODIUM CHLORIDE 0.9 % IV SOLN: 250.0000 mL | INTRAVENOUS | Status: AC | PRN

## 2024-07-10 MED ORDER — SODIUM CHLORIDE 0.9% FLUSH
3.0000 mL | Freq: Two times a day (BID) | INTRAVENOUS | Status: DC
  Administered 2024-07-20 – 2024-07-21 (×2): 3 mL via INTRAVENOUS

## 2024-07-10 MED ORDER — TRAZODONE HCL 50 MG PO TABS
100.0000 mg | ORAL_TABLET | Freq: Every evening | ORAL | Status: DC | PRN
  Administered 2024-07-11 – 2024-07-20 (×6): 100 mg via ORAL
  Filled 2024-07-10 (×6): qty 2

## 2024-07-10 MED ORDER — LEVOTHYROXINE SODIUM 75 MCG PO TABS
75.0000 ug | ORAL_TABLET | Freq: Every day | ORAL | Status: DC
Start: 2024-07-11 — End: 2024-08-03
  Administered 2024-07-11 – 2024-08-03 (×15): 75 ug via ORAL
  Filled 2024-07-10 (×18): qty 1

## 2024-07-10 MED ORDER — ONDANSETRON HCL 4 MG PO TABS
4.0000 mg | ORAL_TABLET | Freq: Four times a day (QID) | ORAL | Status: DC | PRN
  Administered 2024-07-21 – 2024-08-02 (×3): 4 mg via ORAL
  Filled 2024-07-10 (×3): qty 1

## 2024-07-10 MED ORDER — SODIUM CHLORIDE 0.9% FLUSH
3.0000 mL | Freq: Two times a day (BID) | INTRAVENOUS | Status: DC
  Administered 2024-07-11 – 2024-07-21 (×5): 3 mL via INTRAVENOUS

## 2024-07-10 MED ORDER — IOHEXOL 350 MG/ML SOLN
75.0000 mL | Freq: Once | INTRAVENOUS | Status: AC | PRN
  Administered 2024-07-10: 75 mL via INTRAVENOUS

## 2024-07-10 MED ORDER — QUETIAPINE FUMARATE 25 MG PO TABS
25.0000 mg | ORAL_TABLET | Freq: Three times a day (TID) | ORAL | Status: DC
Start: 2024-07-10 — End: 2024-07-11
  Administered 2024-07-11 (×2): 25 mg via ORAL
  Filled 2024-07-10 (×2): qty 1

## 2024-07-10 MED ORDER — ACETAMINOPHEN 325 MG PO TABS
650.0000 mg | ORAL_TABLET | Freq: Four times a day (QID) | ORAL | Status: DC | PRN
  Administered 2024-07-11: 650 mg via ORAL
  Filled 2024-07-10: qty 2

## 2024-07-10 NOTE — H&P (Signed)
 History and Physical    [name] FMW:[id number] DOB: 15-Jun-1941 DOA: 07/10/2024  PCP: Debrah Josette ORN., PA-C   Patient coming from: Home   Chief Complaint: UTI symptom and unsteadiness ED TRIAGE note:  Pt [facility] from home for UTI symptoms including burning sensation with hx of same.  PT has had repeated falls over the last few days d/t unsteadiness. Hx of dementia but relatively oriented per EMS.    128/palp, HR 68 RR 18 96% CBG 150      HPI:  [name] is a 83 y.o. female with medical history significant of dementia with behavioral disturbance (on Seroquel  100 mg daily, Ativan  as needed), TIA, essential hypertension, hyperlipidemia, chronic depression, left lumbar radiculopathy status post decompression of the left foot drop, and chronic gait abnormality presented emergency department with complaining of increasing confusion and increased urination.  Daughter at the bedside provided the story that patient is more confused over the course of 1 week also irritable and more aggressive as well. Patient doing an appropriate things like putting ice cream in the water.  Daughter reported she acted confused like this when she has a UTI in the past. Patient denies any fever, chill nausea, vomiting and UTI symptoms.  However daughter noticed dark/concentrated urine.  Daughter also reported that patient fell out of the bed 4 days ago unsure if the patient hit the head.  History of frequent falls from unsteady gait.  Daughter at the bedside reported that patient has dementia diagnosed 5 years ago and over the time it has been progressively getting worse however it has been never bad like this.  For last 1 week patient has extensive agitation confusion and aggressiveness and with fluctuating cognition.  Patient gets extremely agitated towards people.  Patient follows neurology with [facility] and behavioral health outpatient currently ongoing process for placement or possible  hospice. Neurology also referred patient to geriatric psychiatry for evaluation for stabilization of mood disorder.  Per chart review of the psychiatry note from 6/25 recommended to avoid Ativan , melatonin at bedtime, continue Seroquel  and Zoloft .   ED Course:  At presentation to ED patient found borderline hypertensive otherwise hemodynamically stable. CBC unremarkable. CMP unremarkable.  UA unremarkable.  CT head no acute intracranial abnormality. CT abdomen pelvis unremarkable.  1.2 cm left lower pole nonobstructing calculus and hydrocephalus unchanged.  Large hiatal hernia. CT cervical spine unremarkable.  Daughter has been informed by ED physician that patient does not have a UTI however her daughter is adamant not to taking her home as she is concerned that patient has acute changes of mental status and confusion.  However per chart review of neurology note patient has dementia with behavior disturbance which is not new.  Hospitalist has been consulted for management of altered mental status in terms of confusion and agitation.  Significant labs in the ED: Lab Orders         Urine Culture         Urinalysis, w/ Reflex to Culture (Infection Suspected) -Urine, Clean Catch         CBC with Differential         Comprehensive metabolic panel         Lipase, blood         CBC         Comprehensive metabolic panel       Review of Systems:  Review of Systems  Unable to perform ROS: Dementia    Past Medical History:  Diagnosis  Date   Arthritis    Chest pain    Chronic back pain    neurostimulator electrode leads overlie the lower thoracic spinal canal - per cxr report 07/10/14   Chronic kidney disease    kidney stone   Chronic UTI (urinary tract infection)    Coronary artery disease    NONOBSTRUCTIVE   Dementia (HCC)    Depression    Dizziness    GERD (gastroesophageal reflux disease)    Headache    History of kidney stones    Hyperlipidemia    Hypertension     Left foot drop    SINCE 2009 - RELATED TO BACK PROBLEM - WEARS BRACE   PONV (postoperative nausea and vomiting)    Spinal stenosis    Thyroid disease    hypothryoid    Past Surgical History:  Procedure Laterality Date   BACK SURGERY     CARDIAC CATHETERIZATION  02/22/2009   EF 60%   CATARACT EXTRACTION     both eyes   COLONOSCOPY     CYSTOSCOPY     FOR KIDNEY STONE   LUMBAR LAMINECTOMY/DECOMPRESSION MICRODISCECTOMY     TOTAL HIP ARTHROPLASTY Left 07/17/2014   Procedure: LEFT TOTAL HIP ARTHROPLASTY ANTERIOR APPROACH;  Surgeon: Donnice JONETTA Car, MD;  Location: [facility];  Service: Orthopedics;  Laterality: Left;     reports that she has never smoked. She has never used smokeless tobacco. She reports that she does not drink alcohol and does not use drugs.  Allergies  Allergen Reactions   Nsaids Other (See Comments)    GI upset  other    Family History  Problem Relation Age of Onset   Cancer Mother        started in gallbladder then spread to liver   Heart attack Father    Colon cancer Neg Hx    Esophageal cancer Neg Hx    Rectal cancer Neg Hx    Stomach cancer Neg Hx     Prior to Admission medications   Medication Sig Start Date End Date Taking? Authorizing Provider  acetaminophen  (TYLENOL ) 500 MG tablet Take 500 mg by mouth every 6 (six) hours as needed for moderate pain.    [provider]  aspirin  81 MG tablet Take 81 mg by mouth in the morning.    [provider]  calcium  carbonate (TUMS - DOSED IN MG ELEMENTAL CALCIUM ) 500 MG chewable tablet Chew 1 tablet by mouth as needed for indigestion or heartburn.    [provider]  Cholecalciferol  (VITAMIN D ) 125 MCG (5000 UT) CAPS Take 5,000 Units by mouth daily.    [provider]  diclofenac  Sodium (VOLTAREN ) 1 % GEL Apply 2 g topically 4 (four) times daily. 02/26/24   Ula Prentice SAUNDERS, MD  doxazosin  (CARDURA ) 8 MG tablet Take 8 mg by mouth daily.    [provider]  ferrous sulfate   324 MG TBEC Take 324 mg by mouth in the morning.    [provider]  fluticasone (FLONASE) 50 MCG/ACT nasal spray Place 1 spray into both nostrils daily as needed for allergies or rhinitis.    [provider]  gabapentin  (NEURONTIN ) 100 MG capsule Take 100 mg by mouth 2 (two) times daily.    [provider]  isosorbide  mononitrate (IMDUR ) 60 MG 24 hr tablet Take 60 mg by mouth daily.    [provider]  levothyroxine  (SYNTHROID ) 75 MCG tablet Take 75 mcg by mouth in the morning. 12/18/20   [provider]  LORazepam  (ATIVAN ) 0.5 MG tablet Take 1 tablet (0.5 mg total) by mouth every 12 (twelve) hours as needed. 06/01/24   Onita Duos, MD  Multiple Vitamin (MULTIVITAMIN) tablet Take 1 tablet by mouth every morning.    [provider]  ondansetron  (ZOFRAN -ODT) 8 MG disintegrating tablet Take 1 tablet (8 mg total) by mouth every 8 (eight) hours as needed for nausea or vomiting. 06/11/22   Raford Lenis, MD  QUEtiapine  (SEROQUEL ) 25 MG tablet TAKE 1 TABLET BY MOUTH EACH MORNING, 1 TABLET IN THE AFTERNOON , AND 3 TABLETS AT BEDTIME 04/18/24   Onita Duos, MD  sertraline  (ZOLOFT ) 100 MG tablet Take 200 mg by mouth in the morning.    [provider]  simvastatin  (ZOCOR ) 40 MG tablet Take 40 mg by mouth at bedtime. 10/31/20   [provider]     Physical Exam: Vitals:   07/10/24 1652 07/10/24 2052 07/10/24 2226  BP: (!) 165/100 (!) 175/88   Pulse: 70 62   Resp: 18 16   Temp: 98.5 F (36.9 C)  98 F (36.7 C)  SpO2: 96% 95%     Physical Exam Vitals and nursing note reviewed.  Constitutional:      Appearance: She is not ill-appearing.     Comments: Agitated and confused  Eyes:     Pupils: Pupils are equal, round, and reactive to light.  Cardiovascular:     Rate and Rhythm: Normal rate and regular rhythm.  Musculoskeletal:     Cervical back: Neck supple.  Psychiatric:        Attention and Perception: She does not perceive visual  hallucinations.        Mood and Affect: Mood is elated. Affect is not inappropriate.        Speech: Speech normal.        Behavior: Behavior is agitated, aggressive and hyperactive.        Thought Content: Thought content is not paranoid or delusional.        Cognition and Memory: Cognition is impaired.        Judgment: Judgment is impulsive and inappropriate.      Labs on Admission: I have personally reviewed following labs and imaging studies  CBC: Recent Labs  Lab 07/10/24 2015  WBC 5.6  NEUTROABS 3.5  HGB 11.9*  HCT 36.0  MCV 98.6  PLT 158   Basic Metabolic Panel: Recent Labs  Lab 07/10/24 2015  NA 140  K 3.9  CL 105  CO2 26  GLUCOSE 91  BUN 20  CREATININE 0.76  CALCIUM  9.2   GFR: CrCl cannot be calculated (Unknown ideal weight.). Liver Function Tests: Recent Labs  Lab 07/10/24 2015  AST 26  ALT 19  ALKPHOS 110  BILITOT 0.5  PROT 6.3*  ALBUMIN 3.8   Recent Labs  Lab 07/10/24 2015  LIPASE 31   No results for input(s): AMMONIA in the last 168 hours. Coagulation Profile: No results for input(s): INR, PROTIME in the last 168 hours. Cardiac Enzymes: No results for input(s): CKTOTAL, CKMB, CKMBINDEX, TROPONINI, TROPONINIHS in the last 168 hours. BNP (last 3 results) No results for input(s): BNP in the last 8760 hours. HbA1C: No results for input(s): HGBA1C in the last 72 hours. CBG: No results for input(s): GLUCAP in the last 168 hours. Lipid Profile: No results for input(s): CHOL, HDL, LDLCALC, TRIG, CHOLHDL, LDLDIRECT in the last 72 hours. Thyroid Function Tests: No results for input(s): TSH, T4TOTAL, FREET4, T3FREE, THYROIDAB in the last  72 hours. Anemia Panel: No results for input(s): VITAMINB12, FOLATE, FERRITIN, TIBC, IRON, RETICCTPCT in the last 72 hours. Urine analysis:    Component Value Date/Time   COLORURINE YELLOW 07/10/2024 1728   APPEARANCEUR CLEAR 07/10/2024 1728   LABSPEC  1.024 07/10/2024 1728   PHURINE 5.0 07/10/2024 1728   GLUCOSEU NEGATIVE 07/10/2024 1728   HGBUR NEGATIVE 07/10/2024 1728   BILIRUBINUR NEGATIVE 07/10/2024 1728   KETONESUR NEGATIVE 07/10/2024 1728   PROTEINUR NEGATIVE 07/10/2024 1728   UROBILINOGEN 1.0 07/10/2014 0915   NITRITE NEGATIVE 07/10/2024 1728   LEUKOCYTESUR NEGATIVE 07/10/2024 1728    Radiological Exams on Admission: I have personally reviewed images CT Cervical Spine Wo Contrast Result Date: 07/10/2024 EXAM: CT CERVICAL SPINE WITHOUT CONTRAST 07/10/2024 09:33:28 PM TECHNIQUE: CT of the cervical spine was performed without the administration of intravenous contrast. Multiplanar reformatted images are provided for review. Automated exposure control, iterative reconstruction, and/or weight based adjustment of the mA/kV was utilized to reduce the radiation dose to as low as reasonably achievable. COMPARISON: 02/15/2024 CLINICAL HISTORY: Neck trauma (Age >= 65y). Pt [facility]b [facility] from home for UTI symptoms including burning sensation with hx of same. PT has had repeated falls over the last few days d/t unsteadiness. Hx of dementia but relatively oriented per EMS. FINDINGS: CERVICAL SPINE: BONES AND ALIGNMENT: No acute fracture or traumatic malalignment. DEGENERATIVE CHANGES: Mild degenerative changes at C6-C7. SOFT TISSUES: No prevertebral soft tissue swelling. IMPRESSION: 1. No acute abnormality of the cervical spine. Electronically signed by: Pinkie Pebbles MD 07/10/2024 09:50 PM EDT RP Workstation: [id number]   CT Head Wo Contrast Result Date: 07/10/2024 EXAM: CT HEAD WITHOUT CONTRAST 07/10/2024 09:33:28 PM TECHNIQUE: CT of the head was performed without the administration of intravenous contrast. Automated exposure control, iterative reconstruction, and/or weight based adjustment of the mA/kV was utilized to reduce the radiation dose to as low as reasonably achievable. COMPARISON: None available. CLINICAL HISTORY: Head trauma, minor (Age  >= 65y). Pt [facility]b [facility] from home for UTI symptoms including burning sensation with hx of same. PT has had repeated falls over the last few days d/t unsteadiness. Hx of dementia but relatively oriented per EMS. FINDINGS: BRAIN AND VENTRICLES: No acute hemorrhage. No evidence of acute infarct. No hydrocephalus. No extra-axial collection. No mass effect or midline shift. Global cortical atrophy. Subcortical and periventricular small vessel ischemic changes. Mild intracranial atherosclerosis. ORBITS: No acute abnormality. SINUSES: No acute abnormality. SOFT TISSUES AND SKULL: No acute soft tissue abnormality. No skull fracture. IMPRESSION: 1. No acute intracranial abnormality. Electronically signed by: Pinkie Pebbles MD 07/10/2024 09:48 PM EDT RP Workstation: [id number]   CT ABDOMEN PELVIS W CONTRAST Result Date: 07/10/2024 EXAM: CT ABDOMEN AND PELVIS WITH CONTRAST 07/10/2024 09:33:28 PM TECHNIQUE: CT of the abdomen and pelvis was performed with the administration of 75 mL of iohexol  (OMNIPAQUE ) 350 MG/ML injection. Multiplanar reformatted images are provided for review. Automated exposure control, iterative reconstruction, and/or weight-based adjustment of the mA/kV was utilized to reduce the radiation dose to as low as reasonably achievable. COMPARISON: 01/23/2024 CLINICAL HISTORY: Bowel obstruction suspected. Pt BIb [facility] from home for UTI symptoms including burning sensation with hx of same. PT has had repeated falls over the last few days d/t unsteadiness. Hx of dementia but relatively oriented per EMS. FINDINGS: LOWER CHEST: Large hiatal hernia/inverted intrathoracic stomach. LIVER: The liver is unremarkable. GALLBLADDER AND BILE DUCTS: Gallbladder is unremarkable. No biliary ductal dilatation. SPLEEN: No acute abnormality. PANCREAS: No acute abnormality. ADRENAL GLANDS: No acute abnormality. KIDNEYS, URETERS AND BLADDER: 4.3  cm simple left upper pole renal cyst (image 22), benign no follow-up is recommended.  12 mm nonobstructing left upper pole renal calculus (image 26), unchanged. No stones in the ureters. No hydronephrosis. No perinephric or periureteral stranding. Urinary bladder is unremarkable. GI AND BOWEL: The stomach is inverted intrathoracically due to a large hiatal hernia. Mild to moderate left colonic stool burden. There is no bowel obstruction. PERITONEUM AND RETROPERITONEUM: No ascites. No free air. VASCULATURE: Aorta is normal in caliber. Atherosclerotic calcifications of the abdominal aorta and branch vessels, although patent. LYMPH NODES: No lymphadenopathy. REPRODUCTIVE ORGANS: The uterus is grossly unremarkable. BONES AND SOFT TISSUES: Mild degenerative changes of the lumbar spine. Thoracic spine stimulator, incompletely visualized. No focal soft tissue abnormality. LIMITATIONS/ARTIFACTS: Motion degraded images. IMPRESSION: 1. No acute findings. 2. 12 mm nonobstructing left upper pole renal calculus, unchanged. No hydronephrosis. 3. Large hiatal hernia. Electronically signed by: Pinkie Pebbles MD 07/10/2024 09:45 PM EDT RP Workstation: [id number]   Assessment/Plan: Principal Problem:   Dementia with behavioral disturbance (HCC) Active Problems:   Altered mental status   Hyperlipidemia   Left foot drop   Gait abnormality   History of dementia   History of TIA (transient ischemic attack)    Assessment and Plan: Dementia with behavioral disturbance Altered mental status-in terms of confusion agitation worsening dementia UTI ruled out -Presented to emergency department accompanied by patient daughter as patient has more confusion as compared to her baseline and she was under impression was patient might have a UTI which is provoking worsening dementia however in the ED lab work showed no evidence of UTI.  Patient is hemodynamically stable - Per chart review patient has been working with outpatient behavioral health, neurology and referred to geriatric neuropsychiatry pending  evaluation. - Concern for worsening dementia with behavioral disturbance - Continue current home medication Seroquel  25 mg twice daily, Zoloft  200 mg daily and trazodone at bedtime.   Per chart review of the neurology note recommended to avoid Ativan  as it will worsen the dementia. - Consulted inpatient psychiatry for medication adjustment. -While in the ED patient started having significant agitation gave Geodon 20 mg once.  Holding Haldol  will see if patient has any improvement with Geodon or not. -Implemented Software engineer.   Essential hypertension -Continue Imdur    Hyperlipidemia -Continue Zocor    History of TIA -Continue aspirin  and Zocor   History of left-sided foot drop History of unsteady gait History of frequent fall -History of frequent fall and unsteady gait from left-sided foot drop - Patient's daughter reported the 4 nights ago patient has a fall from the bed. - CT head, CT cervical spine, CT abdomen pelvis unremarkable. - Continue fall precaution. -Consulted inpatient PT and OT   DVT prophylaxis:  SCDs and TED hose.  Will avoid pharmacological DVT prophylaxis as history of frequent falls with dementia. Code Status:  Full Code Diet: Heart healthy diet Family Communication:   Family was present at bedside, at the time of interview. Opportunity was given to ask question and all questions were answered satisfactorily.  Disposition Plan:    Consults: Psychiatry Admission status:   Observation, Med-Surg  Severity of Illness: The appropriate patient status for this patient is OBSERVATION. Observation status is judged to be reasonable and necessary in order to provide the required intensity of service to ensure the patient's safety. The patient's presenting symptoms, physical exam findings, and initial radiographic and laboratory data in the context of their medical condition is felt to place them at decreased risk for further clinical  deterioration. Furthermore, it is  anticipated that the patient will be medically stable for discharge from the hospital within 2 midnights of admission.     Lai Hendriks, MD [facility]  How to contact the [facility] Attending or Consulting provider 7A - 7P or covering provider during after hours 7P -7A, for this patient.  Check the care team in [facility] and look for a) attending/consulting [facility] provider listed and b) the [facility] team listed Log into [email or web address] and use [facility]'s universal password to access. If you do not have the password, please contact the hospital operator. Locate the [facility] provider you are looking for under [facility] and page to a number that you can be directly reached. If you still have difficulty reaching the provider, please page the [facility] (Director on Call) for the Hospitalists listed on amion for assistance.  07/11/2024, 12:49 AM

## 2024-07-10 NOTE — ED Triage Notes (Signed)
 Pt [facility] from home for UTI symptoms including burning sensation with hx of same.  PT has had repeated falls over the last few days d/t unsteadiness. Hx of dementia but relatively oriented per EMS.   128/palp, HR 68 RR 18 96% CBG 150

## 2024-07-10 NOTE — ED Notes (Addendum)
 Pt told doc she doesn't remember multiple falls.  Oriented to self at this time.

## 2024-07-10 NOTE — H&P (Incomplete)
 History and Physical    [name] FMW:[id number] DOB: 1941-02-15 DOA: 07/10/2024  PCP: Debrah Josette ORN., PA-C   Patient coming from: Home   Chief Complaint: UTI symptom and unsteadiness ED TRIAGE note:  Pt BIb [facility] from home for UTI symptoms including burning sensation with hx of same.  PT has had repeated falls over the last few days d/t unsteadiness. Hx of dementia but relatively oriented per EMS.    128/palp, HR 68 RR 18 96% CBG 150      HPI:  [name] is a 83 y.o. female with medical history significant of dementia with behavioral disturbance (on Seroquel  100 mg daily, Ativan  as needed), TIA, essential hypertension, hyperlipidemia, chronic depression, left lumbar radiculopathy status post decompression of the left foot drop, and chronic gait abnormality presented emergency department with complaining of increasing confusion and increased urination.  Daughter at the bedside provided the story that patient is more confused over the course of 1 week also irritable and more aggressive as well. Patient doing an appropriate things like putting ice cream in the water.  Daughter reported she acted confused like this when she has a UTI in the past. Patient denies any fever, chill nausea, vomiting and UTI symptoms.  However daughter noticed dark/concentrated urine.  Daughter also reported that patient fell out of the bed 4 days ago unsure if the patient hit the head.  History of frequent falls from unsteady gait.  Patient follows neurology with [facility] and behavioral health outpatient currently ongoing process for placement or possible hospice. Neurology also referred patient to geriatric psychiatry for evaluation for stabilization of mood disorder.  Per chart review of the psychiatry note from 6/25 recommended to avoid Ativan  melatonin at bedtime, Seroquel  and Zoloft .   ED Course:  At presentation to ED patient found borderline hypertensive otherwise hemodynamically  stable. CBC unremarkable. CMP unremarkable.  UA unremarkable.  CT head no acute intracranial abnormality. CT abdomen pelvis unremarkable.  1.2 cm left lower pole nonobstructing calculus and hydrocephalus unchanged.  Large hiatal hernia. CT cervical spine unremarkable.  Daughter has been informed by ED physician that patient does not have a UTI however her daughter is adamant not to taking her home as she is concerned that patient has acute changes of mental status and confusion.  However per chart review of neurology note patient has dementia with behavior disturbance which is not new.  Hospitalist has been consulted for management of altered mental status in terms of confusion and agitation.  Significant labs in the ED: Lab Orders         Urine Culture         Urinalysis, w/ Reflex to Culture (Infection Suspected) -Urine, Clean Catch         CBC with Differential         Comprehensive metabolic panel         Lipase, blood         CBC         Comprehensive metabolic panel       Review of Systems:  ROS  Past Medical History:  Diagnosis Date  . Arthritis   . Chest pain   . Chronic back pain    neurostimulator electrode leads overlie the lower thoracic spinal canal - per cxr report 07/10/14  . Chronic kidney disease    kidney stone  . Chronic UTI (urinary tract infection)   . Coronary artery disease    NONOBSTRUCTIVE  . Dementia (HCC)   .  Depression   . Dizziness   . GERD (gastroesophageal reflux disease)   . Headache   . History of kidney stones   . Hyperlipidemia   . Hypertension   . Left foot drop    SINCE 2009 - RELATED TO BACK PROBLEM - WEARS BRACE  . PONV (postoperative nausea and vomiting)   . Spinal stenosis   . Thyroid disease    hypothryoid    Past Surgical History:  Procedure Laterality Date  . BACK SURGERY    . CARDIAC CATHETERIZATION  02/22/2009   EF 60%  . CATARACT EXTRACTION     both eyes  . COLONOSCOPY    . CYSTOSCOPY     FOR KIDNEY STONE   . LUMBAR LAMINECTOMY/DECOMPRESSION MICRODISCECTOMY    . TOTAL HIP ARTHROPLASTY Left 07/17/2014   Procedure: LEFT TOTAL HIP ARTHROPLASTY ANTERIOR APPROACH;  Surgeon: Donnice JONETTA Car, MD;  Location: [facility];  Service: Orthopedics;  Laterality: Left;     reports that she has never smoked. She has never used smokeless tobacco. She reports that she does not drink alcohol and does not use drugs.  Allergies  Allergen Reactions  . Nsaids Other (See Comments)    GI upset  other    Family History  Problem Relation Age of Onset  . Cancer Mother        started in gallbladder then spread to liver  . Heart attack Father   . Colon cancer Neg Hx   . Esophageal cancer Neg Hx   . Rectal cancer Neg Hx   . Stomach cancer Neg Hx     Prior to Admission medications   Medication Sig Start Date End Date Taking? Authorizing Provider  acetaminophen  (TYLENOL ) 500 MG tablet Take 500 mg by mouth every 6 (six) hours as needed for moderate pain.    [provider]  aspirin  81 MG tablet Take 81 mg by mouth in the morning.    [provider]  calcium  carbonate (TUMS - DOSED IN MG ELEMENTAL CALCIUM ) 500 MG chewable tablet Chew 1 tablet by mouth as needed for indigestion or heartburn.    [provider]  Cholecalciferol  (VITAMIN D ) 125 MCG (5000 UT) CAPS Take 5,000 Units by mouth daily.    [provider]  diclofenac  Sodium (VOLTAREN ) 1 % GEL Apply 2 g topically 4 (four) times daily. 02/26/24   Ula Prentice SAUNDERS, MD  doxazosin  (CARDURA ) 8 MG tablet Take 8 mg by mouth daily.    [provider]  ferrous sulfate  324 MG TBEC Take 324 mg by mouth in the morning.    [provider]  fluticasone (FLONASE) 50 MCG/ACT nasal spray Place 1 spray into both nostrils daily as needed for allergies or rhinitis.    [provider]  gabapentin  (NEURONTIN ) 100 MG capsule Take 100 mg by mouth 2 (two) times daily.    [provider]  isosorbide  mononitrate (IMDUR ) 60 MG  24 hr tablet Take 60 mg by mouth daily.    [provider]  levothyroxine  (SYNTHROID ) 75 MCG tablet Take 75 mcg by mouth in the morning. 12/18/20   [provider]  LORazepam  (ATIVAN ) 0.5 MG tablet Take 1 tablet (0.5 mg total) by mouth every 12 (twelve) hours as needed. 06/01/24   Onita Duos, MD  Multiple Vitamin (MULTIVITAMIN) tablet Take 1 tablet by mouth every morning.    [provider]  ondansetron  (ZOFRAN -ODT) 8 MG disintegrating tablet Take 1 tablet (8 mg total) by mouth every 8 (eight) hours as  needed for nausea or vomiting. 06/11/22   Raford Lenis, MD  QUEtiapine  (SEROQUEL ) 25 MG tablet TAKE 1 TABLET BY MOUTH EACH MORNING, 1 TABLET IN THE AFTERNOON , AND 3 TABLETS AT BEDTIME 04/18/24   Onita Duos, MD  sertraline  (ZOLOFT ) 100 MG tablet Take 200 mg by mouth in the morning.    [provider]  simvastatin  (ZOCOR ) 40 MG tablet Take 40 mg by mouth at bedtime. 10/31/20   [provider]     Physical Exam: Vitals:   07/10/24 1652 07/10/24 2052 07/10/24 2226  BP: (!) 165/100 (!) 175/88   Pulse: 70 62   Resp: 18 16   Temp: 98.5 F (36.9 C)  98 F (36.7 C)  SpO2: 96% 95%     Physical Exam   Labs on Admission: I have personally reviewed following labs and imaging studies  CBC: Recent Labs  Lab 07/10/24 2015  WBC 5.6  NEUTROABS 3.5  HGB 11.9*  HCT 36.0  MCV 98.6  PLT 158   Basic Metabolic Panel: Recent Labs  Lab 07/10/24 2015  NA 140  K 3.9  CL 105  CO2 26  GLUCOSE 91  BUN 20  CREATININE 0.76  CALCIUM  9.2   GFR: CrCl cannot be calculated (Unknown ideal weight.). Liver Function Tests: Recent Labs  Lab 07/10/24 2015  AST 26  ALT 19  ALKPHOS 110  BILITOT 0.5  PROT 6.3*  ALBUMIN 3.8   Recent Labs  Lab 07/10/24 2015  LIPASE 31   No results for input(s): AMMONIA in the last 168 hours. Coagulation Profile: No results for input(s): INR, PROTIME in the last 168 hours. Cardiac Enzymes: No results for input(s):  CKTOTAL, CKMB, CKMBINDEX, TROPONINI, TROPONINIHS in the last 168 hours. BNP (last 3 results) No results for input(s): BNP in the last 8760 hours. HbA1C: No results for input(s): HGBA1C in the last 72 hours. CBG: No results for input(s): GLUCAP in the last 168 hours. Lipid Profile: No results for input(s): CHOL, HDL, LDLCALC, TRIG, CHOLHDL, LDLDIRECT in the last 72 hours. Thyroid Function Tests: No results for input(s): TSH, T4TOTAL, FREET4, T3FREE, THYROIDAB in the last 72 hours. Anemia Panel: No results for input(s): VITAMINB12, FOLATE, FERRITIN, TIBC, IRON, RETICCTPCT in the last 72 hours. Urine analysis:    Component Value Date/Time   COLORURINE YELLOW 07/10/2024 1728   APPEARANCEUR CLEAR 07/10/2024 1728   LABSPEC 1.024 07/10/2024 1728   PHURINE 5.0 07/10/2024 1728   GLUCOSEU NEGATIVE 07/10/2024 1728   HGBUR NEGATIVE 07/10/2024 1728   BILIRUBINUR NEGATIVE 07/10/2024 1728   KETONESUR NEGATIVE 07/10/2024 1728   PROTEINUR NEGATIVE 07/10/2024 1728   UROBILINOGEN 1.0 07/10/2014 0915   NITRITE NEGATIVE 07/10/2024 1728   LEUKOCYTESUR NEGATIVE 07/10/2024 1728    Radiological Exams on Admission: I have personally reviewed images CT Cervical Spine Wo Contrast Result Date: 07/10/2024 EXAM: CT CERVICAL SPINE WITHOUT CONTRAST 07/10/2024 09:33:28 PM TECHNIQUE: CT of the cervical spine was performed without the administration of intravenous contrast. Multiplanar reformatted images are provided for review. Automated exposure control, iterative reconstruction, and/or weight based adjustment of the mA/kV was utilized to reduce the radiation dose to as low as reasonably achievable. COMPARISON: 02/15/2024 CLINICAL HISTORY: Neck trauma (Age >= 65y). Pt [facility]b [facility] from home for UTI symptoms including burning sensation with hx of same. PT has had repeated falls over the last few days d/t unsteadiness. Hx of dementia but relatively oriented per EMS.  FINDINGS: CERVICAL SPINE: BONES AND ALIGNMENT: No acute fracture or traumatic malalignment. DEGENERATIVE CHANGES: Mild  degenerative changes at C6-C7. SOFT TISSUES: No prevertebral soft tissue swelling. IMPRESSION: 1. No acute abnormality of the cervical spine. Electronically signed by: Pinkie Pebbles MD 07/10/2024 09:50 PM EDT RP Workstation: [id number]   CT Head Wo Contrast Result Date: 07/10/2024 EXAM: CT HEAD WITHOUT CONTRAST 07/10/2024 09:33:28 PM TECHNIQUE: CT of the head was performed without the administration of intravenous contrast. Automated exposure control, iterative reconstruction, and/or weight based adjustment of the mA/kV was utilized to reduce the radiation dose to as low as reasonably achievable. COMPARISON: None available. CLINICAL HISTORY: Head trauma, minor (Age >= 65y). Pt BIb [facility] from home for UTI symptoms including burning sensation with hx of same. PT has had repeated falls over the last few days d/t unsteadiness. Hx of dementia but relatively oriented per EMS. FINDINGS: BRAIN AND VENTRICLES: No acute hemorrhage. No evidence of acute infarct. No hydrocephalus. No extra-axial collection. No mass effect or midline shift. Global cortical atrophy. Subcortical and periventricular small vessel ischemic changes. Mild intracranial atherosclerosis. ORBITS: No acute abnormality. SINUSES: No acute abnormality. SOFT TISSUES AND SKULL: No acute soft tissue abnormality. No skull fracture. IMPRESSION: 1. No acute intracranial abnormality. Electronically signed by: Pinkie Pebbles MD 07/10/2024 09:48 PM EDT RP Workstation: [id number]   CT ABDOMEN PELVIS W CONTRAST Result Date: 07/10/2024 EXAM: CT ABDOMEN AND PELVIS WITH CONTRAST 07/10/2024 09:33:28 PM TECHNIQUE: CT of the abdomen and pelvis was performed with the administration of 75 mL of iohexol  (OMNIPAQUE ) 350 MG/ML injection. Multiplanar reformatted images are provided for review. Automated exposure control, iterative reconstruction, and/or  weight-based adjustment of the mA/kV was utilized to reduce the radiation dose to as low as reasonably achievable. COMPARISON: 01/23/2024 CLINICAL HISTORY: Bowel obstruction suspected. Pt BIb [facility] from home for UTI symptoms including burning sensation with hx of same. PT has had repeated falls over the last few days d/t unsteadiness. Hx of dementia but relatively oriented per EMS. FINDINGS: LOWER CHEST: Large hiatal hernia/inverted intrathoracic stomach. LIVER: The liver is unremarkable. GALLBLADDER AND BILE DUCTS: Gallbladder is unremarkable. No biliary ductal dilatation. SPLEEN: No acute abnormality. PANCREAS: No acute abnormality. ADRENAL GLANDS: No acute abnormality. KIDNEYS, URETERS AND BLADDER: 4.3 cm simple left upper pole renal cyst (image 22), benign no follow-up is recommended. 12 mm nonobstructing left upper pole renal calculus (image 26), unchanged. No stones in the ureters. No hydronephrosis. No perinephric or periureteral stranding. Urinary bladder is unremarkable. GI AND BOWEL: The stomach is inverted intrathoracically due to a large hiatal hernia. Mild to moderate left colonic stool burden. There is no bowel obstruction. PERITONEUM AND RETROPERITONEUM: No ascites. No free air. VASCULATURE: Aorta is normal in caliber. Atherosclerotic calcifications of the abdominal aorta and branch vessels, although patent. LYMPH NODES: No lymphadenopathy. REPRODUCTIVE ORGANS: The uterus is grossly unremarkable. BONES AND SOFT TISSUES: Mild degenerative changes of the lumbar spine. Thoracic spine stimulator, incompletely visualized. No focal soft tissue abnormality. LIMITATIONS/ARTIFACTS: Motion degraded images. IMPRESSION: 1. No acute findings. 2. 12 mm nonobstructing left upper pole renal calculus, unchanged. No hydronephrosis. 3. Large hiatal hernia. Electronically signed by: Pinkie Pebbles MD 07/10/2024 09:45 PM EDT RP Workstation: [id number]     EKG: My personal interpretation of EKG shows:  ***    Assessment/Plan: Principal Problem:   Dementia with behavioral disturbance (HCC) Active Problems:   Altered mental status   Hyperlipidemia   Left foot drop   Gait abnormality   History of dementia   History of TIA (transient ischemic attack)    Assessment and Plan: Dementia with behavioral disturbance Altered mental status-in terms  of confusion agitation worsening dementia UTI ruled out -Presented to emergency department accompanied by patient daughter as patient has more confusion as compared to her baseline and she was under impression was patient might have a UTI which is provoking worsening dementia however in the ED lab work showed no evidence of UTI.  Patient is hemodynamically stable - Per chart review patient has been working with outpatient behavioral health, neurology and referred to geriatric neuropsychiatry pending evaluation. - Concern for worsening dementia with behavioral disturbance - Continue current home medication Seroquel  25 mg twice daily, Zoloft  200 mg daily and trazodone at bedtime.  Per chart review of the neurology note recommended to avoid Ativan  as it will worsen the dementia. - Consulted inpatient psychiatry for medication adjustment.  Essential hypertension -Continue Imdur    Hyperlipidemia -Continue Zocor    History of TIA -Continue aspirin  and Zocor   History of left-sided foot drop History of unsteady gait History of frequent fall -History of frequent fall and unsteady gait from left-sided foot drop - Patient's daughter reported the 4 nights ago patient has a fall from the bed. - CT head, CT cervical spine, CT abdomen pelvis unremarkable. - Continue fall precaution. -Consulted inpatient PT and OT   DVT prophylaxis:  SCDs and TED hose.  Will avoid pharmacological DVT prophylaxis as history of frequent falls with dementia. Code Status:  Full Code Diet: Heart healthy diet Family Communication:  *** Family was present at bedside, at  the time of interview.  Opportunity was given to ask question and all questions were answered satisfactorily.  Disposition Plan:  ***  Consults:  ***  Admission status:   Observation, Med-Surg  Severity of Illness: The appropriate patient status for this patient is OBSERVATION. Observation status is judged to be reasonable and necessary in order to provide the required intensity of service to ensure the patient's safety. The patient's presenting symptoms, physical exam findings, and initial radiographic and laboratory data in the context of their medical condition is felt to place them at decreased risk for further clinical deterioration. Furthermore, it is anticipated that the patient will be medically stable for discharge from the hospital within 2 midnights of admission.     Brook Mall, MD [facility]  How to contact the [facility] Attending or Consulting provider 7A - 7P or covering provider during after hours 7P -7A, for this patient.  Check the care team in [facility] and look for a) attending/consulting [facility] provider listed and b) the [facility] team listed Log into [email or web address] and use [place]'s universal password to access. If you do not have the password, please contact the hospital operator. Locate the [facility] provider you are looking for under [facility] and page to a number that you can be directly reached. If you still have difficulty reaching the provider, please page the [facility] (Director on Call) for the Hospitalists listed on amion for assistance.  07/10/2024, 11:45 PM

## 2024-07-10 NOTE — ED Provider Triage Note (Signed)
 Emergency Medicine Provider Triage Evaluation Note  [name] , a 83 y.o. female  was evaluated in triage.  Pt complains of back pain, states that she has chronic back pain and back she has had back surgery and has signs of scars on her exam that are well-healed.  She has a spinal stimulator just to the right of the midline.  She states she has had back pain for several days, according to the paramedic she has had frequent falls over the last few days because of feeling like she is unsteady on her feet.  She complains of having mild dysuria but no fevers no vomiting no diarrhea no shortness of breath.  Review of Systems  Positive: Back pain, dysuria Negative: Swelling, fever, numbness, weakness  Physical Exam  BP (!) 165/100 (BP Location: Left Arm)   Pulse 70   Temp 98.5 F (36.9 C)   Resp 18   SpO2 96%  Gen:   Awake, no distress answers questions appropriately, evidently has mild dementia and does not remember the falls Resp:  Normal effort clear lungs, clear heart sounds, soft abdomen MSK:   Moves extremities without difficulty, in fact he is able to lift both legs off of the stretcher without any difficulty, normal strength and sensation bilaterally.  Normal grips bilaterally.  Normal mental status    Medical Decision Making  Medically screening exam initiated at 5:27 PM.  Appropriate orders placed.  [name] was informed that the remainder of the evaluation will be completed by another provider, this initial triage assessment does not replace that evaluation, and the importance of remaining in the ED until their evaluation is complete.  The patient does not appear to be in any distress but due to her unsteadiness she does not need to go back to the waiting room in case she falls.  No focal neurologic deficits no fever not ill-appearing, urinalysis ordered   Cleotilde Rogue, MD 07/10/24 1729

## 2024-07-10 NOTE — ED Provider Notes (Signed)
 [place] EMERGENCY DEPARTMENT AT [facility] Provider Note   CSN: [id number] Arrival date & time: 07/10/24  1650     Patient presents with: No chief complaint on file.   [name] is a 83 y.o. female with a past medical history significant for hypertension, hyperlipidemia, CAD, history of TIA, history of dementia, and history of left foot drop who presents to the ED due to confusion and increased urination.  Daughter at bedside provided history.  Daughter notes patient has been more confused over the past week.  Patient also has been irritable and more aggressive.  Daughter notes that patient has been doing things she does not normally do like putting her ice cream in water.  Daughter notes that patient has acted like this in the past when she had a UTI.  No fever or chills.  Patient notes she is having some upper abdominal pain.  Denies nausea, vomiting, diarrhea.  Denies chest pain and shortness of breath. Daughter has noticed some increased urinary frequency and darker urine. Daughter also notes patient fell out of bed 4 days ago. Unsure if she hit her head. Has frequent falls. Not on any blood thinners.   History obtained from patient and past medical records. No interpreter used during encounter.       Prior to Admission medications   Medication Sig Start Date End Date Taking? Authorizing Provider  acetaminophen  (TYLENOL ) 500 MG tablet Take 500 mg by mouth every 6 (six) hours as needed for moderate pain.    [provider]  aspirin  81 MG tablet Take 81 mg by mouth in the morning.    [provider]  calcium  carbonate (TUMS - DOSED IN MG ELEMENTAL CALCIUM ) 500 MG chewable tablet Chew 1 tablet by mouth as needed for indigestion or heartburn.    [provider]  Cholecalciferol  (VITAMIN D ) 125 MCG (5000 UT) CAPS Take 5,000 Units by mouth daily.    [provider]  diclofenac  Sodium (VOLTAREN ) 1 % GEL Apply 2 g topically 4 (four) times  daily. 02/26/24   Ula Prentice SAUNDERS, MD  doxazosin  (CARDURA ) 8 MG tablet Take 8 mg by mouth daily.    [provider]  ferrous sulfate  324 MG TBEC Take 324 mg by mouth in the morning.    [provider]  fluticasone (FLONASE) 50 MCG/ACT nasal spray Place 1 spray into both nostrils daily as needed for allergies or rhinitis.    [provider]  gabapentin  (NEURONTIN ) 100 MG capsule Take 100 mg by mouth 2 (two) times daily.    [provider]  isosorbide  mononitrate (IMDUR ) 60 MG 24 hr tablet Take 60 mg by mouth daily.    [provider]  levothyroxine  (SYNTHROID ) 75 MCG tablet Take 75 mcg by mouth in the morning. 12/18/20   [provider]  LORazepam  (ATIVAN ) 0.5 MG tablet Take 1 tablet (0.5 mg total) by mouth every 12 (twelve) hours as needed. 06/01/24   Onita Duos, MD  Multiple Vitamin (MULTIVITAMIN) tablet Take 1 tablet by mouth every morning.    [provider]  ondansetron  (ZOFRAN -ODT) 8 MG disintegrating tablet Take 1 tablet (8 mg total) by mouth every 8 (eight) hours as needed for nausea or vomiting. 06/11/22   Raford Lenis, MD  QUEtiapine  (SEROQUEL ) 25 MG tablet TAKE 1 TABLET BY MOUTH EACH MORNING, 1 TABLET IN THE AFTERNOON , AND 3 TABLETS AT BEDTIME 04/18/24   Onita Duos, MD  sertraline  (ZOLOFT ) 100 MG tablet Take 200 mg  by mouth in the morning.    [provider]  simvastatin  (ZOCOR ) 40 MG tablet Take 40 mg by mouth at bedtime. 10/31/20   [provider]    Allergies: Nsaids    Review of Systems  Constitutional:  Negative for chills and fever.  Respiratory:  Negative for shortness of breath.   Cardiovascular:  Negative for chest pain.  Gastrointestinal:  Positive for abdominal pain. Negative for diarrhea, nausea and vomiting.  Genitourinary:  Positive for frequency. Negative for dysuria.    Updated Vital Signs BP (!) 175/88   Pulse 62   Temp 98 F (36.7 C)   Resp 16   SpO2 95%   Physical Exam Vitals and  nursing note reviewed.  Constitutional:      General: She is not in acute distress.    Appearance: She is not ill-appearing.  HENT:     Head: Normocephalic.  Eyes:     Pupils: Pupils are equal, round, and reactive to light.  Cardiovascular:     Rate and Rhythm: Normal rate and regular rhythm.     Pulses: Normal pulses.     Heart sounds: Normal heart sounds. No murmur heard.    No friction rub. No gallop.  Pulmonary:     Effort: Pulmonary effort is normal.     Breath sounds: Normal breath sounds.  Abdominal:     General: Abdomen is flat. There is no distension.     Palpations: Abdomen is soft.     Tenderness: There is no abdominal tenderness. There is no guarding or rebound.  Musculoskeletal:        General: Normal range of motion.     Cervical back: Neck supple.  Skin:    General: Skin is warm and dry.  Neurological:     General: No focal deficit present.     Mental Status: She is alert.     Comments: Speech is clear, able to follow commands CN III-XII intact Normal strength in upper and lower extremities bilaterally including dorsiflexion and plantar flexion, strong and equal grip strength Sensation grossly intact throughout Moves extremities without ataxia, coordination intact No pronator drift  Psychiatric:        Mood and Affect: Mood normal.        Behavior: Behavior normal.     (all labs ordered are listed, but only abnormal results are displayed) Labs Reviewed  CBC WITH DIFFERENTIAL/PLATELET - Abnormal; Notable for the following components:      Result Value   RBC 3.65 (*)    Hemoglobin 11.9 (*)    All other components within normal limits  COMPREHENSIVE METABOLIC PANEL WITH GFR - Abnormal; Notable for the following components:   Total Protein 6.3 (*)    All other components within normal limits  URINE CULTURE  URINALYSIS, W/ REFLEX TO CULTURE (INFECTION SUSPECTED)  LIPASE, BLOOD  CBC  COMPREHENSIVE METABOLIC PANEL WITH GFR    EKG: EKG  Interpretation Date/Time:  Monday July 10 2024 20:37:34 EDT Ventricular Rate:  61 PR Interval:  174 QRS Duration:  86 QT Interval:  436 QTC Calculation: 438 R Axis:   -25  Text Interpretation: Normal sinus rhythm Minimal voltage criteria for LVH, may be normal variant ( R in aVL ) Nonspecific T wave abnormality Abnormal ECG When compared with ECG of 15-Feb-2024 22:33, No significant change was found Confirmed by Raford Lenis ([id number]) on 07/10/2024 11:18:33 PM  Radiology: CT Cervical Spine Wo Contrast Result Date: 07/10/2024 EXAM: CT CERVICAL SPINE WITHOUT CONTRAST 07/10/2024  09:33:28 PM TECHNIQUE: CT of the cervical spine was performed without the administration of intravenous contrast. Multiplanar reformatted images are provided for review. Automated exposure control, iterative reconstruction, and/or weight based adjustment of the mA/kV was utilized to reduce the radiation dose to as low as reasonably achievable. COMPARISON: 02/15/2024 CLINICAL HISTORY: Neck trauma (Age >= 65y). Pt BIb [facility] from home for UTI symptoms including burning sensation with hx of same. PT has had repeated falls over the last few days d/t unsteadiness. Hx of dementia but relatively oriented per EMS. FINDINGS: CERVICAL SPINE: BONES AND ALIGNMENT: No acute fracture or traumatic malalignment. DEGENERATIVE CHANGES: Mild degenerative changes at C6-C7. SOFT TISSUES: No prevertebral soft tissue swelling. IMPRESSION: 1. No acute abnormality of the cervical spine. Electronically signed by: Pinkie Pebbles MD 07/10/2024 09:50 PM EDT RP Workstation: [id number]   CT Head Wo Contrast Result Date: 07/10/2024 EXAM: CT HEAD WITHOUT CONTRAST 07/10/2024 09:33:28 PM TECHNIQUE: CT of the head was performed without the administration of intravenous contrast. Automated exposure control, iterative reconstruction, and/or weight based adjustment of the mA/kV was utilized to reduce the radiation dose to as low as reasonably achievable.  COMPARISON: None available. CLINICAL HISTORY: Head trauma, minor (Age >= 65y). Pt BIb [facility] from home for UTI symptoms including burning sensation with hx of same. PT has had repeated falls over the last few days d/t unsteadiness. Hx of dementia but relatively oriented per EMS. FINDINGS: BRAIN AND VENTRICLES: No acute hemorrhage. No evidence of acute infarct. No hydrocephalus. No extra-axial collection. No mass effect or midline shift. Global cortical atrophy. Subcortical and periventricular small vessel ischemic changes. Mild intracranial atherosclerosis. ORBITS: No acute abnormality. SINUSES: No acute abnormality. SOFT TISSUES AND SKULL: No acute soft tissue abnormality. No skull fracture. IMPRESSION: 1. No acute intracranial abnormality. Electronically signed by: Pinkie Pebbles MD 07/10/2024 09:48 PM EDT RP Workstation: [id number]   CT ABDOMEN PELVIS W CONTRAST Result Date: 07/10/2024 EXAM: CT ABDOMEN AND PELVIS WITH CONTRAST 07/10/2024 09:33:28 PM TECHNIQUE: CT of the abdomen and pelvis was performed with the administration of 75 mL of iohexol  (OMNIPAQUE ) 350 MG/ML injection. Multiplanar reformatted images are provided for review. Automated exposure control, iterative reconstruction, and/or weight-based adjustment of the mA/kV was utilized to reduce the radiation dose to as low as reasonably achievable. COMPARISON: 01/23/2024 CLINICAL HISTORY: Bowel obstruction suspected. Pt BIb [facility] from home for UTI symptoms including burning sensation with hx of same. PT has had repeated falls over the last few days d/t unsteadiness. Hx of dementia but relatively oriented per EMS. FINDINGS: LOWER CHEST: Large hiatal hernia/inverted intrathoracic stomach. LIVER: The liver is unremarkable. GALLBLADDER AND BILE DUCTS: Gallbladder is unremarkable. No biliary ductal dilatation. SPLEEN: No acute abnormality. PANCREAS: No acute abnormality. ADRENAL GLANDS: No acute abnormality. KIDNEYS, URETERS AND BLADDER: 4.3 cm simple left  upper pole renal cyst (image 22), benign no follow-up is recommended. 12 mm nonobstructing left upper pole renal calculus (image 26), unchanged. No stones in the ureters. No hydronephrosis. No perinephric or periureteral stranding. Urinary bladder is unremarkable. GI AND BOWEL: The stomach is inverted intrathoracically due to a large hiatal hernia. Mild to moderate left colonic stool burden. There is no bowel obstruction. PERITONEUM AND RETROPERITONEUM: No ascites. No free air. VASCULATURE: Aorta is normal in caliber. Atherosclerotic calcifications of the abdominal aorta and branch vessels, although patent. LYMPH NODES: No lymphadenopathy. REPRODUCTIVE ORGANS: The uterus is grossly unremarkable. BONES AND SOFT TISSUES: Mild degenerative changes of the lumbar spine. Thoracic spine stimulator, incompletely visualized. No focal soft tissue abnormality. LIMITATIONS/ARTIFACTS: Motion  degraded images. IMPRESSION: 1. No acute findings. 2. 12 mm nonobstructing left upper pole renal calculus, unchanged. No hydronephrosis. 3. Large hiatal hernia. Electronically signed by: Pinkie Pebbles MD 07/10/2024 09:45 PM EDT RP Workstation: [id number]     Procedures   Medications Ordered in the ED  aspirin  chewable tablet 81 mg (has no administration in time range)  simvastatin  (ZOCOR ) tablet 40 mg (has no administration in time range)  QUEtiapine  (SEROQUEL ) tablet 25 mg (has no administration in time range)  sertraline  (ZOLOFT ) tablet 200 mg (has no administration in time range)  levothyroxine  (SYNTHROID ) tablet 75 mcg (has no administration in time range)  isosorbide  mononitrate (IMDUR ) 24 hr tablet 60 mg (has no administration in time range)  sodium chloride  flush (NS) 0.9 % injection 3 mL (has no administration in time range)  sodium chloride  flush (NS) 0.9 % injection 3 mL (has no administration in time range)  sodium chloride  flush (NS) 0.9 % injection 3 mL (has no administration in time range)  0.9 %  sodium  chloride infusion (has no administration in time range)  acetaminophen  (TYLENOL ) tablet 650 mg (has no administration in time range)    Or  acetaminophen  (TYLENOL ) suppository 650 mg (has no administration in time range)  ondansetron  (ZOFRAN ) tablet 4 mg (has no administration in time range)    Or  ondansetron  (ZOFRAN ) injection 4 mg (has no administration in time range)  traZODone (DESYREL) tablet 100 mg (has no administration in time range)  iohexol  (OMNIPAQUE ) 350 MG/ML injection 75 mL (75 mLs Intravenous Contrast Given 07/10/24 2134)                                    Medical Decision Making Amount and/or Complexity of Data Reviewed Independent Historian: caregiver    Details: Daughter at bedside provided history External Data Reviewed: notes. Labs: ordered. Decision-making details documented in ED Course. Radiology: ordered and independent interpretation performed. Decision-making details documented in ED Course. ECG/medicine tests: ordered and independent interpretation performed. Decision-making details documented in ED Course.  Risk Prescription drug management. Decision regarding hospitalization.   This patient presents to the ED for concern of AMS, this involves an extensive number of treatment options, and is a complaint that carries with it a high risk of complications and morbidity.  The differential diagnosis includes UTI, electrolyte abnormalities, CVA, infection, etc  83 year old female presents to the ED due to increased urinary frequency and confusion.  Daughter at bedside notes she is acting similar to when she has had a UTI in the past.  Patient also admits to some upper abdominal pain.  No nausea, vomiting, or diarrhea.  Had a fall 4 days ago out of bed.  Upon arrival patient afebrile, not tachycardic or hypoxic.  Patient well-appearing on exam.  Normal neurological exam without any neurological deficits. Low suspicion for CVA. Abdomen soft, nondistended with some  upper abdominal tenderness.  Routine labs ordered.  UA to rule out UTI.  CT head given recent fall and change in behavior.  CT abdomen due to tenderness on exam.  CBC with no leukocytosis.  Hemoglobin 11.9.  CMP with normal renal function.  No major electrolyte derangements.  Lipase normal.  Low suspicion for pancreatitis.  UA unremarkable.  No evidence of infection.  CT head personally reviewed and interpreted which is negative for any acute abnormalities.  CT cervical spine negative.  CT abdomen negative for any acute abnormalities.  Does  demonstrate a 12 mm nonobstructing left upper pole renal calculus which is unchanged from previous imaging.  No hydronephrosis. EKG NSR.   I had a long discussion with daughter at bedside about reassuring workup.  Daughter notes she does not feel comfortable patient returning home at this point due to altered mental status.  Per daughter, patient has not returned to baseline.  Will discuss with hospitalist for admission for altered mental status.  11:37 PM Discussed with Dr. Sundil with [facility] who agrees to admit patient  Co morbidities that complicate the patient evaluation  dementia Cardiac Monitoring: / EKG:  The patient was maintained on a cardiac monitor.  I personally viewed and interpreted the cardiac monitored which showed an underlying rhythm of: NSR  Social Determinants of Health:  Elderly >65  Test / Admission - Considered:  Admit for AMS   Discussed with Dr. Elnor who agrees with assessment and plan.    Final diagnoses:  Change in behavior    ED Discharge Orders     None          Lorelle Aleck BROCKS, PA-C 07/10/24 2346    Elnor Bernarda SQUIBB, DO 07/21/24 1341

## 2024-07-11 ENCOUNTER — Observation Stay (HOSPITAL_COMMUNITY)

## 2024-07-11 ENCOUNTER — Other Ambulatory Visit: Payer: Self-pay

## 2024-07-11 DIAGNOSIS — F03918 Unspecified dementia, unspecified severity, with other behavioral disturbance: Secondary | ICD-10-CM | POA: Diagnosis not present

## 2024-07-11 LAB — COMPREHENSIVE METABOLIC PANEL WITH GFR
ALT: 21 U/L (ref 0–44)
AST: 29 U/L (ref 15–41)
Albumin: 4 g/dL (ref 3.5–5.0)
Alkaline Phosphatase: 107 U/L (ref 38–126)
Anion gap: 12 (ref 5–15)
BUN: 15 mg/dL (ref 8–23)
CO2: 23 mmol/L (ref 22–32)
Calcium: 9.4 mg/dL (ref 8.9–10.3)
Chloride: 103 mmol/L (ref 98–111)
Creatinine, Ser: 0.7 mg/dL (ref 0.44–1.00)
GFR, Estimated: >60 mL/min (ref >60)
Glucose, Bld: 109 mg/dL — ABNORMAL HIGH (ref 70–99)
Potassium: 3.7 mmol/L (ref 3.5–5.1)
Sodium: 138 mmol/L (ref 135–145)
Total Bilirubin: 0.8 mg/dL (ref 0.0–1.2)
Total Protein: 6.7 g/dL (ref 6.5–8.1)

## 2024-07-11 LAB — CBC
HCT: 38.5 % (ref 36.0–46.0)
Hemoglobin: 13 g/dL (ref 12.0–15.0)
MCH: 32.6 pg (ref 26.0–34.0)
MCHC: 33.8 g/dL (ref 30.0–36.0)
MCV: 96.5 fL (ref 80.0–100.0)
Platelets: 162 K/uL (ref 150–400)
RBC: 3.99 MIL/uL (ref 3.87–5.11)
RDW: 12.2 % (ref 11.5–15.5)
WBC: 7.2 K/uL (ref 4.0–10.5)
nRBC: 0 % (ref 0.0–0.2)

## 2024-07-11 MED ORDER — ACETAMINOPHEN 325 MG PO TABS
650.0000 mg | ORAL_TABLET | Freq: Three times a day (TID) | ORAL | Status: AC
Start: 2024-07-11 — End: 2024-07-13
  Administered 2024-07-11: 650 mg via ORAL
  Filled 2024-07-11: qty 2

## 2024-07-11 MED ORDER — HALOPERIDOL LACTATE 5 MG/ML IJ SOLN
1.0000 mg | Freq: Four times a day (QID) | INTRAMUSCULAR | Status: DC | PRN
  Administered 2024-07-11: 1 mg via INTRAVENOUS
  Filled 2024-07-11: qty 1

## 2024-07-11 MED ORDER — HALOPERIDOL LACTATE 5 MG/ML IJ SOLN: 5.0000 mg | Freq: Four times a day (QID) | INTRAMUSCULAR | Status: DC | PRN

## 2024-07-11 MED ORDER — ZIPRASIDONE MESYLATE 20 MG IM SOLR
20.0000 mg | Freq: Four times a day (QID) | INTRAMUSCULAR | Status: DC | PRN
  Administered 2024-07-12: 20 mg via INTRAMUSCULAR
  Filled 2024-07-11 (×3): qty 20

## 2024-07-11 MED ORDER — HALOPERIDOL LACTATE 5 MG/ML IJ SOLN: 2.0000 mg | Freq: Four times a day (QID) | INTRAMUSCULAR | Status: DC | PRN

## 2024-07-11 MED ORDER — HALOPERIDOL LACTATE 5 MG/ML IJ SOLN
2.0000 mg | Freq: Four times a day (QID) | INTRAMUSCULAR | Status: DC | PRN
  Administered 2024-07-12: 2 mg via INTRAVENOUS
  Filled 2024-07-11: qty 1
  Filled 2024-07-11: qty 0.4

## 2024-07-11 MED ORDER — QUETIAPINE FUMARATE 25 MG PO TABS
75.0000 mg | ORAL_TABLET | Freq: Every day | ORAL | Status: DC
  Administered 2024-07-11 – 2024-07-13 (×2): 75 mg via ORAL
  Filled 2024-07-11 (×2): qty 3

## 2024-07-11 MED ORDER — ENSURE PLUS HIGH PROTEIN PO LIQD
237.0000 mL | Freq: Two times a day (BID) | ORAL | Status: DC
  Administered 2024-07-14 – 2024-08-02 (×31): 237 mL via ORAL

## 2024-07-11 MED ORDER — ENOXAPARIN SODIUM 40 MG/0.4ML IJ SOSY
40.0000 mg | PREFILLED_SYRINGE | INTRAMUSCULAR | Status: DC
  Administered 2024-07-17 – 2024-08-02 (×14): 40 mg via SUBCUTANEOUS
  Filled 2024-07-11 (×16): qty 0.4

## 2024-07-11 MED ORDER — ZIPRASIDONE MESYLATE 20 MG IM SOLR
20.0000 mg | INTRAMUSCULAR | Status: AC
  Administered 2024-07-11: 20 mg via INTRAMUSCULAR
  Filled 2024-07-11: qty 20

## 2024-07-11 MED ORDER — QUETIAPINE FUMARATE 25 MG PO TABS
25.0000 mg | ORAL_TABLET | Freq: Two times a day (BID) | ORAL | Status: DC
  Administered 2024-07-13: 25 mg via ORAL
  Filled 2024-07-11 (×5): qty 1

## 2024-07-11 MED ORDER — QUETIAPINE FUMARATE 25 MG PO TABS: 25.0000 mg | ORAL_TABLET | Freq: Every day | ORAL | Status: DC

## 2024-07-11 MED ORDER — ZIPRASIDONE MESYLATE 20 MG IM SOLR: 20.0000 mg | Freq: Four times a day (QID) | INTRAMUSCULAR | Status: DC | PRN

## 2024-07-11 MED ORDER — HALOPERIDOL LACTATE 5 MG/ML IJ SOLN
5.0000 mg | Freq: Once | INTRAMUSCULAR | Status: DC
  Filled 2024-07-11: qty 1

## 2024-07-11 MED ORDER — HALOPERIDOL LACTATE 5 MG/ML IJ SOLN
5.0000 mg | Freq: Four times a day (QID) | INTRAMUSCULAR | Status: DC | PRN
  Administered 2024-07-11: 5 mg via INTRAMUSCULAR
  Filled 2024-07-11: qty 1

## 2024-07-11 MED ORDER — HALOPERIDOL LACTATE 5 MG/ML IJ SOLN: 2.0000 mg | Freq: Once | INTRAMUSCULAR | Status: DC

## 2024-07-11 NOTE — Progress Notes (Signed)
 Patient has become agitated not redirectable, attacking staff, and verbally abusive. Haldol  5mg  Im given.

## 2024-07-11 NOTE — Progress Notes (Addendum)
 PROGRESS NOTE  [name] FMW:[id number] DOB: 01-11-1941 DOA: 07/10/2024 PCP: Debrah Josette ORN., PA-C  HPI/Recap of past 24 hours: [name] is a 83 y.o. female with medical history significant for progressive dementia with behavioral disturbance, TIA, HTN, HLD, depression, left lumbar radiculopathy status post decompression, chronic gait abnormality presented to the ED due to worsening confusion more than baseline with aggression/increased irritability for about 1 week. History of frequent falls from unsteady gait, recently about 4 days PTA. Patient follows neurology with [facility] and behavioral health, plans for patient to be referred to geriatric psychiatry was initiated.  In the ED, patient was hemodynamically stable, labs, UA, unremarkable.  CT head/cervical spine with no acute intracranial abnormality, CT abdomen/pelvis with no acute findings, except for 12 mm nonobstructive renal calculus on the left, no hydronephrosis, large hiatal hernia.  Daughter insisting on admission as she is unable to care for patient at home.  Psychiatry consulted.  Patient admitted for further management.      Today, met PT/OT at bedside with patient, patient noted to be drowsy/sleepy likely med induced.  Unable to engage in any meaningful conversation.  Of note, patient was able to ambulate to the bathroom and back to her bed.    Assessment/Plan: Principal Problem:   Dementia with behavioral disturbance (HCC) Active Problems:   Altered mental status   Hyperlipidemia   Left foot drop   Gait abnormality   History of dementia   History of TIA (transient ischemic attack)   Dementia with worsening behavioral disturbance Acute metabolic encephalopathy Currently calm, sleeping, easily arousable UA negative for infection BC x 2 pending collection CXR pending CT head negative for any acute intracranial abnormality Per chart review patient has been working with outpatient behavioral health,  neurology and referred to geriatric neuropsychiatry pending evaluation Inpatient psychiatric consulted, recommend adding Seroquel  75 mg at bedtime, continuing Zoloft  200 mg daily, continue trazodone 100 mg at bedtime as needed, Geodon 20 mg IM every 6 hours as needed for severe agitation as well as Haldol  Per chart review of the neurology note recommended to avoid Ativan  as it will worsen the dementia Safety sitter Fall precautions  History of left-sided foot drop History of unsteady gait History of frequent fall CT head, CT cervical spine, CT abdomen pelvis unremarkable Reports some chronic back pain Scheduled Tylenol  x 2 days Continue fall precaution PT/OT  Essential hypertension Continue Imdur    Hyperlipidemia Continue Zocor    History of TIA Continue aspirin  and Zocor       Estimated body mass index is 20.81 kg/m as calculated from the following:   Height as of 02/15/24: 5' 4 (1.626 m).   Weight as of 02/22/24: 55 kg.     Code Status: Full  Family Communication: None at bedside  Disposition Plan: Status is: Observation The patient will require care spanning > 2 midnights and should be moved to inpatient because: Level of care      Consultants: Psychiatry  Procedures: None  Antimicrobials: None  DVT prophylaxis: Lovenox    Objective: Vitals:   07/10/24 2226 07/11/24 0059 07/11/24 0800 07/11/24 1152  BP:  120/66 124/76 113/65  Pulse:  64 70 66  Resp:  18    Temp: 98 F (36.7 C) 97.6 F (36.4 C) 97.9 F (36.6 C) 98.2 F (36.8 C)  TempSrc:  Oral Oral   SpO2:  94% 95% 95%    Intake/Output Summary (Last 24 hours) at 07/11/2024 1405 Last data filed at 07/11/2024 0956 Gross per 24  hour  Intake 240 ml  Output --  Net 240 ml   There were no vitals filed for this visit.  Exam:  General: NAD, sleepy Cardiovascular: S1, S2 present Respiratory: CTAB Abdomen: Soft, nontender, nondistended, bowel sounds present Musculoskeletal: No bilateral pedal  edema noted Skin: Normal Psychiatry: Unable to assess   Data Reviewed: CBC: Recent Labs  Lab 07/10/24 2015 07/11/24 0328  WBC 5.6 7.2  NEUTROABS 3.5  --   HGB 11.9* 13.0  HCT 36.0 38.5  MCV 98.6 96.5  PLT 158 162   Basic Metabolic Panel: Recent Labs  Lab 07/10/24 2015 07/11/24 0328  NA 140 138  K 3.9 3.7  CL 105 103  CO2 26 23  GLUCOSE 91 109*  BUN 20 15  CREATININE 0.76 0.70  CALCIUM  9.2 9.4   GFR: CrCl cannot be calculated (Unknown ideal weight.). Liver Function Tests: Recent Labs  Lab 07/10/24 2015 07/11/24 0328  AST 26 29  ALT 19 21  ALKPHOS 110 107  BILITOT 0.5 0.8  PROT 6.3* 6.7  ALBUMIN 3.8 4.0   Recent Labs  Lab 07/10/24 2015  LIPASE 31   No results for input(s): AMMONIA in the last 168 hours. Coagulation Profile: No results for input(s): INR, PROTIME in the last 168 hours. Cardiac Enzymes: No results for input(s): CKTOTAL, CKMB, CKMBINDEX, TROPONINI in the last 168 hours. BNP (last 3 results) No results for input(s): PROBNP in the last 8760 hours. HbA1C: No results for input(s): HGBA1C in the last 72 hours. CBG: No results for input(s): GLUCAP in the last 168 hours. Lipid Profile: No results for input(s): CHOL, HDL, LDLCALC, TRIG, CHOLHDL, LDLDIRECT in the last 72 hours. Thyroid Function Tests: No results for input(s): TSH, T4TOTAL, FREET4, T3FREE, THYROIDAB in the last 72 hours. Anemia Panel: No results for input(s): VITAMINB12, FOLATE, FERRITIN, TIBC, IRON, RETICCTPCT in the last 72 hours. Urine analysis:    Component Value Date/Time   COLORURINE YELLOW 07/10/2024 1728   APPEARANCEUR CLEAR 07/10/2024 1728   LABSPEC 1.024 07/10/2024 1728   PHURINE 5.0 07/10/2024 1728   GLUCOSEU NEGATIVE 07/10/2024 1728   HGBUR NEGATIVE 07/10/2024 1728   BILIRUBINUR NEGATIVE 07/10/2024 1728   KETONESUR NEGATIVE 07/10/2024 1728   PROTEINUR NEGATIVE 07/10/2024 1728   UROBILINOGEN 1.0 07/10/2014  0915   NITRITE NEGATIVE 07/10/2024 1728   LEUKOCYTESUR NEGATIVE 07/10/2024 1728   Sepsis Labs: @LABRCNTIP (procalcitonin:4,lacticidven:4)  )No results found for this or any previous visit (from the past 240 hours).    Studies: CT Cervical Spine Wo Contrast Result Date: 07/10/2024 EXAM: CT CERVICAL SPINE WITHOUT CONTRAST 07/10/2024 09:33:28 PM TECHNIQUE: CT of the cervical spine was performed without the administration of intravenous contrast. Multiplanar reformatted images are provided for review. Automated exposure control, iterative reconstruction, and/or weight based adjustment of the mA/kV was utilized to reduce the radiation dose to as low as reasonably achievable. COMPARISON: 02/15/2024 CLINICAL HISTORY: Neck trauma (Age >= 65y). Pt BIb [facility] from home for UTI symptoms including burning sensation with hx of same. PT has had repeated falls over the last few days d/t unsteadiness. Hx of dementia but relatively oriented per EMS. FINDINGS: CERVICAL SPINE: BONES AND ALIGNMENT: No acute fracture or traumatic malalignment. DEGENERATIVE CHANGES: Mild degenerative changes at C6-C7. SOFT TISSUES: No prevertebral soft tissue swelling. IMPRESSION: 1. No acute abnormality of the cervical spine. Electronically signed by: Pinkie Pebbles MD 07/10/2024 09:50 PM EDT RP Workstation: [id number]   CT Head Wo Contrast Result Date: 07/10/2024 EXAM: CT HEAD WITHOUT CONTRAST 07/10/2024 09:33:28 PM TECHNIQUE: CT  of the head was performed without the administration of intravenous contrast. Automated exposure control, iterative reconstruction, and/or weight based adjustment of the mA/kV was utilized to reduce the radiation dose to as low as reasonably achievable. COMPARISON: None available. CLINICAL HISTORY: Head trauma, minor (Age >= 65y). Pt BIb [facility] from home for UTI symptoms including burning sensation with hx of same. PT has had repeated falls over the last few days d/t unsteadiness. Hx of dementia but relatively  oriented per EMS. FINDINGS: BRAIN AND VENTRICLES: No acute hemorrhage. No evidence of acute infarct. No hydrocephalus. No extra-axial collection. No mass effect or midline shift. Global cortical atrophy. Subcortical and periventricular small vessel ischemic changes. Mild intracranial atherosclerosis. ORBITS: No acute abnormality. SINUSES: No acute abnormality. SOFT TISSUES AND SKULL: No acute soft tissue abnormality. No skull fracture. IMPRESSION: 1. No acute intracranial abnormality. Electronically signed by: Pinkie Pebbles MD 07/10/2024 09:48 PM EDT RP Workstation: [id number]   CT ABDOMEN PELVIS W CONTRAST Result Date: 07/10/2024 EXAM: CT ABDOMEN AND PELVIS WITH CONTRAST 07/10/2024 09:33:28 PM TECHNIQUE: CT of the abdomen and pelvis was performed with the administration of 75 mL of iohexol  (OMNIPAQUE ) 350 MG/ML injection. Multiplanar reformatted images are provided for review. Automated exposure control, iterative reconstruction, and/or weight-based adjustment of the mA/kV was utilized to reduce the radiation dose to as low as reasonably achievable. COMPARISON: 01/23/2024 CLINICAL HISTORY: Bowel obstruction suspected. Pt BIb [facility] from home for UTI symptoms including burning sensation with hx of same. PT has had repeated falls over the last few days d/t unsteadiness. Hx of dementia but relatively oriented per EMS. FINDINGS: LOWER CHEST: Large hiatal hernia/inverted intrathoracic stomach. LIVER: The liver is unremarkable. GALLBLADDER AND BILE DUCTS: Gallbladder is unremarkable. No biliary ductal dilatation. SPLEEN: No acute abnormality. PANCREAS: No acute abnormality. ADRENAL GLANDS: No acute abnormality. KIDNEYS, URETERS AND BLADDER: 4.3 cm simple left upper pole renal cyst (image 22), benign no follow-up is recommended. 12 mm nonobstructing left upper pole renal calculus (image 26), unchanged. No stones in the ureters. No hydronephrosis. No perinephric or periureteral stranding. Urinary bladder is  unremarkable. GI AND BOWEL: The stomach is inverted intrathoracically due to a large hiatal hernia. Mild to moderate left colonic stool burden. There is no bowel obstruction. PERITONEUM AND RETROPERITONEUM: No ascites. No free air. VASCULATURE: Aorta is normal in caliber. Atherosclerotic calcifications of the abdominal aorta and branch vessels, although patent. LYMPH NODES: No lymphadenopathy. REPRODUCTIVE ORGANS: The uterus is grossly unremarkable. BONES AND SOFT TISSUES: Mild degenerative changes of the lumbar spine. Thoracic spine stimulator, incompletely visualized. No focal soft tissue abnormality. LIMITATIONS/ARTIFACTS: Motion degraded images. IMPRESSION: 1. No acute findings. 2. 12 mm nonobstructing left upper pole renal calculus, unchanged. No hydronephrosis. 3. Large hiatal hernia. Electronically signed by: Pinkie Pebbles MD 07/10/2024 09:45 PM EDT RP Workstation: [id number]    Scheduled Meds:  aspirin   81 mg Oral Daily   isosorbide  mononitrate  60 mg Oral Daily   levothyroxine   75 mcg Oral Q0600   QUEtiapine   25 mg Oral BID   QUEtiapine   75 mg Oral QHS   sertraline   200 mg Oral Daily   simvastatin   40 mg Oral QHS   sodium chloride  flush  3 mL Intravenous Q12H   sodium chloride  flush  3 mL Intravenous Q12H    Continuous Infusions:  sodium chloride        LOS: 0 days     Lebron JINNY Cage, MD [facility]  If 7PM-7AM, please contact night-coverage [email or web address] 07/11/2024, 2:05 PM

## 2024-07-11 NOTE — Plan of Care (Signed)
  Problem: Clinical Measurements: Goal: Ability to maintain clinical measurements within normal limits will improve Outcome: Progressing Goal: Will remain free from infection Outcome: Progressing Goal: Diagnostic test results will improve Outcome: Progressing Goal: Respiratory complications will improve Outcome: Progressing Goal: Cardiovascular complication will be avoided Outcome: Progressing   Problem: Activity: Goal: Risk for activity intolerance will decrease Outcome: Progressing   Problem: Nutrition: Goal: Adequate nutrition will be maintained Outcome: Progressing   Problem: Coping: Goal: Level of anxiety will decrease Outcome: Progressing   Problem: Elimination: Goal: Will not experience complications related to bowel motility Outcome: Progressing Goal: Will not experience complications related to urinary retention Outcome: Progressing   Problem: Pain Managment: Goal: General experience of comfort will improve and/or be controlled Outcome: Progressing   Problem: Safety: Goal: Ability to remain free from injury will improve Outcome: Progressing   Problem: Skin Integrity: Goal: Risk for impaired skin integrity will decrease Outcome: Progressing   Problem: Education: Goal: Knowledge of General Education information will improve Description: Including pain rating scale, medication(s)/side effects and non-pharmacologic comfort measures Outcome: Not Progressing   Problem: Health Behavior/Discharge Planning: Goal: Ability to manage health-related needs will improve Outcome: Not Progressing   Problem: Safety: Goal: Non-violent Restraint(s) Outcome: Completed/Met

## 2024-07-11 NOTE — Evaluation (Signed)
 Occupational Therapy Evaluation Patient Details Name: [name] MRN: [id number] DOB: May 08, 1941 Today's Date: 07/11/2024   History of Present Illness   Pt is an 83 y.o. female presented 07/10/24 due to UTI symptoms and unsteadiness with repeated falls over the last few days. UA -PMH significant for CAD, HTN, dementia with behavioral disturbances, frequent UTIs, TIA, L  lumbar radiculopathy status post decompression of the left foot drop (Simultaneous filing. User may not have seen previous data.)     Clinical Impressions Pt presented with sitter in room curled up in the bed and needed increase in time to awaken to be able to sit at EOB with min x2 for safety. Pt then engaged in self feeding with set up to min assist while sitting at EOB. She then required CGA to step pivot to chair. She then engaged in washing face and hair care but needs min assist to finish due to quality. At this time pt needs 24/7 supervision to be able to return to home. No family present for PLOF but was pulled from chart with prior admission. Patient will benefit from continued inpatient follow up therapy, <3 hours/day but if family feels with cognition/behaviors may do better in home set up with [facility] therapy?      If plan is discharge home, recommend the following:   A little help with walking and/or transfers;A little help with bathing/dressing/bathroom;Assistance with cooking/housework;Assistance with feeding;Direct supervision/assist for medications management;Direct supervision/assist for financial management;Assist for transportation;Help with stairs or ramp for entrance;Supervision due to cognitive status     Functional Status Assessment   Patient has had a recent decline in their functional status and demonstrates the ability to make significant improvements in function in a reasonable and predictable amount of time.     Equipment Recommendations   Wheelchair (measurements OT)     Recommendations  for Other Services         Precautions/Restrictions   Precautions Precautions: Fall (Simultaneous filing. User may not have seen previous data.) Recall of Precautions/Restrictions: Impaired (Simultaneous filing. User may not have seen previous data.) Restrictions Weight Bearing Restrictions Per Provider Order: No (Simultaneous filing. User may not have seen previous data.)     Mobility Bed Mobility Overal bed mobility: Needs Assistance Bed Mobility: Supine to Sit     Supine to sit: Min assist, +2 for physical assistance, HOB elevated, Used rails     General bed mobility comments: +2 for saftey    Transfers Overall transfer level: Needs assistance Equipment used: 1 person hand held assist Transfers: Sit to/from Stand Sit to Stand: Contact guard assist                  Balance Overall balance assessment: Needs assistance Sitting-balance support: Feet supported, Bilateral upper extremity supported, Single extremity supported Sitting balance-Leahy Scale: Fair Sitting balance - Comments: lateral lean when looking for blankets in bed   Standing balance support: Single extremity supported Standing balance-Leahy Scale: Fair Standing balance comment: very kyphotic                           ADL either performed or assessed with clinical judgement   ADL Overall ADL's : Needs assistance/impaired Eating/Feeding: Minimal assistance;Sitting;Set up Eating/Feeding Details (indicate cue type and reason): min to cut up if needed and very minimal distractions Grooming: Wash/dry face;Set up;Minimal assistance Grooming Details (indicate cue type and reason): may need assist for quality Upper Body Bathing: Minimal assistance;Sitting   Lower Body  Bathing: Moderate assistance;Maximal assistance;Sit to/from stand   Upper Body Dressing : Minimal assistance;Sitting   Lower Body Dressing: Moderate assistance;Maximal assistance;Sit to/from stand   Toilet Transfer:  Contact guard assist;Cueing for safety;Cueing for sequencing   Toileting- Clothing Manipulation and Hygiene: Contact guard assist;Minimal assistance Toileting - Clothing Manipulation Details (indicate cue type and reason): for cognition in sequencing     Functional mobility during ADLs: Contact guard assist;Cueing for sequencing;Cueing for safety       Vision Baseline Vision/History: 1 Wears glasses Ability to See in Adequate Light: 0 Adequate Patient Visual Report: No change from baseline Vision Assessment?: No apparent visual deficits Additional Comments: pt noted picking very small items off items     Perception         Praxis         Pertinent Vitals/Pain Pain Assessment Pain Assessment: No/denies pain     Extremity/Trunk Assessment Upper Extremity Assessment Upper Extremity Assessment: Overall WFL for tasks assessed;Difficult to assess due to impaired cognition       Cervical / Trunk Assessment Cervical / Trunk Assessment: Kyphotic   Communication Communication Communication: No apparent difficulties   Cognition Arousal: Lethargic Behavior During Therapy: Flat affect Cognition: History of cognitive impairments             OT - Cognition Comments: Per chart there has a been a decline but unsure what is the ost current baseline. No family was present during the session. Pt only able to answer name.                 Following commands: Impaired Following commands impaired: Follows one step commands inconsistently     Cueing  General Comments   Cueing Techniques: Verbal cues      Exercises     Shoulder Instructions      Home Living Family/patient expects to be discharged to:: Private residence (Simultaneous filing. User may not have seen previous data.)   Available Help at Discharge: Family;Personal care attendant;Available 24 hours/day Type of Home: House Home Access: Stairs to enter [employer] of Steps: 1   Home Layout:  One level     Bathroom Shower/Tub: Chief Strategy Officer: Standard     Home Equipment: Agricultural consultant (2 wheels)   Additional Comments: caregiver every other day and one of her daughters stays the other days and the other daughter stays nights. All this information was pulled from the chart as pt could not answer      Prior Functioning/Environment Prior Level of Function : Needs assist       Physical Assist : Mobility (physical);ADLs (physical) Mobility (physical): Transfers;Gait ADLs (physical): Toileting;Dressing;Bathing;Grooming;IADLs Mobility Comments: Pt completes functional ambulation without use of AD. Supervision provided for safety due to impaired balance and history of falls per daughter ADLs Comments: Pt able to complete ADLs physically, supervision provided for safety due to impaired balance and history of falls. Assistance provided for IADLs due to impaired cognition    OT Problem List: Decreased strength;Decreased activity tolerance;Impaired balance (sitting and/or standing);Decreased knowledge of use of DME or AE   OT Treatment/Interventions: Self-care/ADL training;Therapeutic exercise;DME and/or AE instruction;Therapeutic activities;Patient/family education;Balance training      OT Goals(Current goals can be found in the care plan section)   Acute Rehab OT Goals Patient Stated Goal: none OT Goal Formulation: Patient unable to participate in goal setting Time For Goal Achievement: 07/25/24 Potential to Achieve Goals: Fair   OT Frequency:  Min 2X/week    Co-evaluation PT/OT/SLP Co-Evaluation/Treatment:  Yes Reason for Co-Treatment: Complexity of the patient's impairments (multi-system involvement)   OT goals addressed during session: ADL's and self-care      AM-PAC OT 6 Clicks Daily Activity     Outcome Measure Help from another person eating meals?: A Little Help from another person taking care of personal grooming?: A Little Help from  another person toileting, which includes using toliet, bedpan, or urinal?: A Little Help from another person bathing (including washing, rinsing, drying)?: A Little Help from another person to put on and taking off regular upper body clothing?: A Little Help from another person to put on and taking off regular lower body clothing?: A Little 6 Click Score: 18   End of Session Equipment Utilized During Treatment: Gait belt Nurse Communication: Mobility status  Activity Tolerance: Patient tolerated treatment well Patient left: in chair;with call bell/phone within reach;with chair alarm set (sitter in room)  OT Visit Diagnosis: Unsteadiness on feet (R26.81);Other abnormalities of gait and mobility (R26.89);Repeated falls (R29.6);Muscle weakness (generalized) (M62.81)                Time: 9083-9056 OT Time Calculation (min): 27 min Charges:  OT General Charges $OT Visit: 1 Visit OT Treatments $Self Care/Home Management : 8-22 mins  Warrick POUR OTR/L  Acute Rehab Services  [phone] office number   Warrick Berber 07/11/2024, 10:41 AM

## 2024-07-11 NOTE — Evaluation (Signed)
 Physical Therapy Evaluation Patient Details Name: [name] MRN: [id number] DOB: 03/05/1941 Today's Date: 07/11/2024  History of Present Illness  Pt is an 83 y.o. female presented 07/10/24 due to UTI symptoms and unsteadiness with repeated falls over the last few days. UA -PMH significant for CAD, HTN, dementia with behavioral disturbances, frequent UTIs, TIA, L  lumbar radiculopathy status post decompression of the left foot drop  Clinical Impression  Pt curled up in ball sleeping on entry, window shades open and increased volume of activity in room, and then called patients name and with increased time she woke up. Agreeable to getting up to [facility] for breakfast. Pt is minAx2 for coming to [facility], where she ate some breakfast. After which, she was agreeable to sit up in recliner. Pt able to stand and pivot to recliner with contact guard. Patient will benefit from continued inpatient follow up therapy, <3 hours/day, however if family feels novel environment will be too stressful, pt could go home with [facility] therapy and 24 hour supervision.          If plan is discharge home, recommend the following: A little help with walking and/or transfers;A little help with bathing/dressing/bathroom;Assistance with cooking/housework;Direct supervision/assist for medications management;Direct supervision/assist for financial management;Assist for transportation;Help with stairs or ramp for entrance;Supervision due to cognitive status   Can travel by private vehicle   No    Equipment Recommendations None recommended by PT     Functional Status Assessment Patient has had a recent decline in their functional status and demonstrates the ability to make significant improvements in function in a reasonable and predictable amount of time.     Precautions / Restrictions Precautions Precautions: Fall Recall of Precautions/Restrictions: Impaired Precaution/Restrictions Comments: frequent falls in days before  hospitalization Restrictions Weight Bearing Restrictions Per Provider Order: No      Mobility  Bed Mobility Overal bed mobility: Needs Assistance Bed Mobility: Supine to Sit     Supine to sit: Min assist, +2 for physical assistance, HOB elevated, Used rails     General bed mobility comments: +2 for saftey    Transfers Overall transfer level: Needs assistance Equipment used: 1 person hand held assist Transfers: Sit to/from Stand, Bed to chair/wheelchair/BSC Sit to Stand: Contact guard assist   Step pivot transfers: Contact guard assist       General transfer comment: 1 person HHA to stand and step to recliner on her L, increased flexed forward posture       Balance Overall balance assessment: Needs assistance Sitting-balance support: Feet supported, Bilateral upper extremity supported, Single extremity supported Sitting balance-Leahy Scale: Fair Sitting balance - Comments: lateral lean when looking for blankets in bed   Standing balance support: Single extremity supported Standing balance-Leahy Scale: Fair Standing balance comment: very kyphotic                             Pertinent Vitals/Pain Pain Assessment Pain Assessment: No/denies pain    Home Living Family/patient expects to be discharged to:: Private residence Living Arrangements: Children Tedra) Available Help at Discharge: Family;Personal care attendant;Available 24 hours/day Type of Home: House Home Access: Stairs to enter   [employer] of Steps: 1   Home Layout: One level Home Equipment: Agricultural consultant (2 wheels) Additional Comments: caregiver every other day and one of her daughters stays the other days and the other daughter stays nights. All this information was pulled from the chart as pt could not answer  Prior Function Prior Level of Function : Needs assist       Physical Assist : Mobility (physical);ADLs (physical) Mobility (physical): Transfers;Gait ADLs  (physical): Toileting;Dressing;Bathing;Grooming;IADLs Mobility Comments: Pt completes functional ambulation without use of AD. Supervision provided for safety due to impaired balance and history of falls per daughter ADLs Comments: Pt able to complete ADLs physically, supervision provided for safety due to impaired balance and history of falls. Assistance provided for IADLs due to impaired cognition     Extremity/Trunk Assessment   Upper Extremity Assessment Upper Extremity Assessment: Defer to OT evaluation    Lower Extremity Assessment Lower Extremity Assessment: Overall WFL for tasks assessed    Cervical / Trunk Assessment Cervical / Trunk Assessment: Kyphotic  Communication   Communication Communication: No apparent difficulties    Cognition Arousal: Lethargic Behavior During Therapy: Flat affect                             Following commands: Impaired Following commands impaired: Follows one step commands inconsistently     Cueing Cueing Techniques: Verbal cues     General Comments General comments (skin integrity, edema, etc.): Sitter notes that pt was able to get up and walk to bathroom without assist        Assessment/Plan    PT Assessment Patient needs continued PT services  PT Problem List Decreased strength;Decreased balance;Decreased mobility;Decreased cognition;Decreased safety awareness       PT Treatment Interventions DME instruction;Gait training;Functional mobility training;Therapeutic activities;Balance training;Therapeutic exercise;Cognitive remediation;Patient/family education    PT Goals (Current goals can be found in the Care Plan section)  Acute Rehab PT Goals PT Goal Formulation: Patient unable to participate in goal setting Time For Goal Achievement: 07/25/24 Potential to Achieve Goals: Fair    Frequency Min 2X/week     Co-evaluation   Reason for Co-Treatment: Complexity of the patient's impairments (multi-system  involvement)   OT goals addressed during session: ADL's and self-care       AM-PAC PT 6 Clicks Mobility  Outcome Measure Help needed turning from your back to your side while in a flat bed without using bedrails?: None Help needed moving from lying on your back to sitting on the side of a flat bed without using bedrails?: None Help needed moving to and from a bed to a chair (including a wheelchair)?: A Little Help needed standing up from a chair using your arms (e.g., wheelchair or bedside chair)?: A Little Help needed to walk in hospital room?: A Little Help needed climbing 3-5 steps with a railing? : A Lot 6 Click Score: 19    End of Session   Activity Tolerance: Patient tolerated treatment well Patient left: in chair;with call bell/phone within reach;with nursing/sitter in room Nurse Communication: Mobility status PT Visit Diagnosis: Unsteadiness on feet (R26.81);Other abnormalities of gait and mobility (R26.89);Muscle weakness (generalized) (M62.81);Difficulty in walking, not elsewhere classified (R26.2);Adult, failure to thrive (R62.7);Other symptoms and signs involving the nervous system (R29.898);Repeated falls (R29.6)    Time: 9083-9056 PT Time Calculation (min) (ACUTE ONLY): 27 min   Charges:   PT Evaluation $PT Eval Low Complexity: 1 Low   PT General Charges $$ ACUTE PT VISIT: 1 Visit         Money Mckeithan B. Fleeta Lapidus PT, DPT Acute Rehabilitation Services Please use secure chat or  Call Office [phone]   [name] [facility] 07/11/2024, 4:06 PM

## 2024-07-11 NOTE — Consult Note (Cosign Needed Addendum)
 [facility] Psychiatry Face-to-Face Psychiatric Evaluation   Service Date: July 11, 2024 LOS:  LOS: 0 days   Assessment   [name] is a 83 y.o. female admitted medically on 07/10/2024  4:50 PM for AMS. She carries the psychiatric diagnoses of MDD and has a past medical history of HTN, HLD, left lumbar radiculopathy, and vascular dementia. Psychiatry was consulted for mention of behavioral disturbance by [name], MD.   Her current presentation of combative, aggressive behavior is most consistent with peripheral disturbance secondary to her underlying vascular dementia.  Agitation could be secondary to many causes, including delirium, underlying pain and untreated depression or anxiety.  Given her unremarkable labs and imaging this is likely sequela of her vascular dementia, and will optimize her psychotropic medication regimen while she is hospitalized.  Current outpatient psychotropic medications include Seroquel  and Zoloft  and historically she has had a good response to these medications. On initial examination, patient is not cooperative with interview and says she would like to go back to bed. Please see plan below for detailed recommendations.   Diagnoses:  Active Hospital problems: Principal Problem:   Dementia with behavioral disturbance (HCC) Active Problems:   Hyperlipidemia   Left foot drop   Gait abnormality   Altered mental status   History of dementia   History of TIA (transient ischemic attack)   Plan  ## Safety and Observation Level:  - Based on my clinical evaluation, I estimate the patient to be at low risk of self harm in the current setting - At this time, we recommend a 1:1 level of observation. This decision is based on my review of the chart including patient's history and current presentation, interview of the patient, mental status examination, and consideration of suicide risk including evaluating suicidal ideation, plan, intent, suicidal or  self-harm behaviors, risk factors, and protective factors. This judgment is based on our ability to directly address suicide risk, implement suicide prevention strategies and develop a safety plan while the patient is in the clinical setting. Please contact our team if there is a concern that risk level has changed.  ## Medications:  -- Adjust Seroquel  25 mg 2 times daily, Seroquel  75mg  at night - Continue Zoloft  200 mg daily - Continue trazodone 100 mg at bedtime prn - agitation protocol added, Geodon 20 mg IM every 6 hours as needed for severe agitation, Haldol  5 mg IM every 6 hours as needed, Haldol  2 mg IV every 6 hours as needed for moderate agitation  ## Recommendations: - consider underlying untreated pain as potential reason for acute agitation, PAIDAD  ## Medical Decision Making Capacity:  Not assessed on this encounter  ## Further Work-up:  Per primary EKG on 07/10/2024 with QTc of 438  ## Disposition:  TBD  ## Behavioral / Environmental:  --1:1  Thank you for this consult request. Recommendations have been communicated to the primary team.  We will sign off at this time.   Alfornia Light, DO  NEW history  Relevant Aspects of Hospital Course:  Admitted on 07/10/2024 for AMS.  Patient Report:  Patient was seen at bedside, laying on her side curled up in ball.  I woke the patient, and she said she did not want to speak with me. I asked patient why she is in the hospital, but patient says she does not know what I am talking about.  Patient thinks that she is in her home.  Patient has sitter at bedside, and was previously in restraints  but sitter is trialing patient out of restraints.  I attempted to interview the patient for a bit longer, however she refused to speak with me and wanted to go back to sleep.  ROS:  Unable to assess  Collateral information:  Daughter, [name] [phone]  Jocelyn says for the past 2 weeks, patient's dementia off the charts, and she's  waking up in the middle of the night.  Patient currently lives with her daughter, and then goes to the daughter's sister's house on the weekends.  Daughter says she no longer feels comfortable with the patient living with her, and does not want her to stay with her after last night.  Jocelyn says she could tell patient was going to get agitated because patient was sitting in the ED for several hours in one place. She says patient started hitting pushing yelling and screaming, which she had never seen before and this scared her.  We discussed patient is on a good medication regimen for behavioral disturbances of dementia, and treatment of agitation and demented patient says treatment of underlying cause for the agitation.  We discussed the patient could potentially be in pain, daughter endorses patient has had back surgery and complaints of back pain frequently, and was endorsing stomach pain upon arrival to the emergency department.  Jocelyn is currently in touch with social work, working on nursing home placement for this patient.  Patient has outpatient neurology follow-up on 07/17/2024.  Psychiatric History:  Information collected from patient, EMR  Family psych history: none Prior psychiatric medication trials: Abilify , Rexulti  Prior psychiatric diagnoses: MDD  Social History:  Lives at home with her daughter  Family History:  The patient's family history includes Cancer in her mother; Heart attack in her father.  Medical History: Past Medical History:  Diagnosis Date   Arthritis    Chest pain    Chronic back pain    neurostimulator electrode leads overlie the lower thoracic spinal canal - per cxr report 07/10/14   Chronic kidney disease    kidney stone   Chronic UTI (urinary tract infection)    Coronary artery disease    NONOBSTRUCTIVE   Dementia (HCC)    Depression    Dizziness    GERD (gastroesophageal reflux disease)    Headache    History of kidney stones    Hyperlipidemia     Hypertension    Left foot drop    SINCE 2009 - RELATED TO BACK PROBLEM - WEARS BRACE   PONV (postoperative nausea and vomiting)    Spinal stenosis    Thyroid disease    hypothryoid   Surgical History: Past Surgical History:  Procedure Laterality Date   BACK SURGERY     CARDIAC CATHETERIZATION  02/22/2009   EF 60%   CATARACT EXTRACTION     both eyes   COLONOSCOPY     CYSTOSCOPY     FOR KIDNEY STONE   LUMBAR LAMINECTOMY/DECOMPRESSION MICRODISCECTOMY     TOTAL HIP ARTHROPLASTY Left 07/17/2014   Procedure: LEFT TOTAL HIP ARTHROPLASTY ANTERIOR APPROACH;  Surgeon: Donnice JONETTA Car, MD;  Location: [facility];  Service: Orthopedics;  Laterality: Left;   Medications:   Current Facility-Administered Medications:    0.9 %  sodium chloride  infusion, 250 mL, Intravenous, PRN, Sundil, Subrina, MD   acetaminophen  (TYLENOL ) tablet 650 mg, 650 mg, Oral, Q6H PRN, 650 mg at 07/11/24 0113 **OR** acetaminophen  (TYLENOL ) suppository 650 mg, 650 mg, Rectal, Q6H PRN, Sundil, Subrina, MD   aspirin  chewable tablet 81 mg, 81  mg, Oral, Daily, Sundil, Subrina, MD   haloperidol  lactate (HALDOL ) injection 1 mg, 1 mg, Intravenous, Q6H PRN, Segars, Jonathan, MD, 1 mg at 07/11/24 0556   haloperidol  lactate (HALDOL ) injection 5 mg, 5 mg, Intramuscular, Once, Pollina, Lonni PARAS, MD   isosorbide  mononitrate (IMDUR ) 24 hr tablet 60 mg, 60 mg, Oral, Daily, Sundil, Subrina, MD   levothyroxine  (SYNTHROID ) tablet 75 mcg, 75 mcg, Oral, Q0600, Sundil, Subrina, MD, 75 mcg at 07/11/24 0554   ondansetron  (ZOFRAN ) tablet 4 mg, 4 mg, Oral, Q6H PRN **OR** ondansetron  (ZOFRAN ) injection 4 mg, 4 mg, Intravenous, Q6H PRN, Sundil, Subrina, MD   QUEtiapine  (SEROQUEL ) tablet 25 mg, 25 mg, Oral, TID, Sundil, Subrina, MD, 25 mg at 07/11/24 0113   sertraline  (ZOLOFT ) tablet 200 mg, 200 mg, Oral, Daily, Sundil, Subrina, MD   simvastatin  (ZOCOR ) tablet 40 mg, 40 mg, Oral, QHS, Sundil, Subrina, MD, 40 mg at 07/11/24 0113   sodium chloride   flush (NS) 0.9 % injection 3 mL, 3 mL, Intravenous, Q12H, Sundil, Subrina, MD   sodium chloride  flush (NS) 0.9 % injection 3 mL, 3 mL, Intravenous, Q12H, Sundil, Subrina, MD   sodium chloride  flush (NS) 0.9 % injection 3 mL, 3 mL, Intravenous, PRN, Sundil, Subrina, MD   traZODone (DESYREL) tablet 100 mg, 100 mg, Oral, QHS PRN, Sundil, Subrina, MD, 100 mg at 07/11/24 0113  Allergies: Allergies  Allergen Reactions   Nsaids Other (See Comments)    GI upset  other   Objective  Vital signs:  Temp:  [97.6 F (36.4 C)-98.5 F (36.9 C)] 97.9 F (36.6 C) (10/14 0800) Pulse Rate:  [62-70] 70 (10/14 0800) Resp:  [16-18] 18 (10/14 0059) BP: (120-175)/(66-100) 124/76 (10/14 0800) SpO2:  [94 %-96 %] 95 % (10/14 0800)  Psychiatric Specialty Exam: Physical Exam Constitutional:      Appearance: the patient is not toxic-appearing.  Pulmonary:     Effort: Pulmonary effort is normal.  Neurological:     General: No focal deficit present.     Mental Status: the patient is alert and oriented to person, but not oriented to time, place or situation  BP 124/76 (BP Location: Right Arm)   Pulse 70   Temp 97.9 F (36.6 C) (Oral)   Resp 18   SpO2 95%   General Appearance: Fairly Groomed  Eye Contact: Poor  Speech: Impoverished and mumbled  Volume: Soft  Mood:  annoyed  Affect: Restricted  Thought Process:  Coherent  Orientation: Not oriented to place time or situation  Thought Content: illogical   Suicidal Thoughts:  DNA  Homicidal Thoughts:  DNA  Memory:  DNA  Judgement:  poor  Insight:  poor  Psychomotor Activity:  Normal  Concentration:  Concentration: DNA  Recall:  DNA  Fund of Knowledge: DNA  Language: fair  Akathisia:  No  Handed:    AIMS (if indicated): not done  Assets:  Health and safety inspector Housing Leisure Time  ADL's:  Intact  Cognition: not oriented to time, place or situation   Sleep:  Fair   Alfornia Light, DO PGY-1

## 2024-07-11 NOTE — Care Management Obs Status (Signed)
 MEDICARE OBSERVATION STATUS NOTIFICATION   Patient Details  Name: [name] MRN: [id number] Date of Birth: [birth year]   Medicare Observation Status Notification Given:  Yes Verbally reviewed observation notice with Melanie Che telephonically at [phone].  Copy will be left in the patient room.   Tristian Bouska 07/11/2024, 10:14 AM

## 2024-07-11 NOTE — ED Notes (Addendum)
 Attempted to move pt to ED holding, pt became combative. Pt also refused to have her vitals taken prior to transport. Pt hitting, and being verbally aggressive towards all staff. MD notified and meds ordered.

## 2024-07-11 NOTE — Plan of Care (Signed)
  Problem: Clinical Measurements: Goal: Ability to maintain clinical measurements within normal limits will improve Outcome: Progressing Goal: Will remain free from infection Outcome: Progressing Goal: Diagnostic test results will improve Outcome: Progressing Goal: Respiratory complications will improve Outcome: Progressing Goal: Cardiovascular complication will be avoided Outcome: Progressing   Problem: Activity: Goal: Risk for activity intolerance will decrease Outcome: Progressing   Problem: Nutrition: Goal: Adequate nutrition will be maintained Outcome: Progressing   Problem: Elimination: Goal: Will not experience complications related to bowel motility Outcome: Progressing Goal: Will not experience complications related to urinary retention Outcome: Progressing   Problem: Pain Managment: Goal: General experience of comfort will improve and/or be controlled Outcome: Progressing   Problem: Safety: Goal: Ability to remain free from injury will improve Outcome: Progressing   Problem: Skin Integrity: Goal: Risk for impaired skin integrity will decrease Outcome: Progressing   Problem: Education: Goal: Knowledge of General Education information will improve Description: Including pain rating scale, medication(s)/side effects and non-pharmacologic comfort measures Outcome: Not Progressing   Problem: Health Behavior/Discharge Planning: Goal: Ability to manage health-related needs will improve Outcome: Not Progressing   Problem: Coping: Goal: Level of anxiety will decrease Outcome: Not Progressing   Problem: Safety: Goal: Non-violent Restraint(s) Outcome: Not Progressing

## 2024-07-11 NOTE — Progress Notes (Addendum)
 Admission Nurse Note: Attempted to complete admission with patient earlier this am, pt was sleeping. Attempted to completed admission later this afternoon. Unable to complete due to impaired cognition, aggression, agitation, and name calling.  Called patient dtr [name] who is the primary caregiver for the patient. States [name] Mink is DPOA. [name] confirms patient's current med treatment is consistent with the treatment plan at home. Concerned the inpatient treatment will not be sufficient to manage the patient's behavior since it is the same treatment she had at home. Informed the psych MD will adjust potentially after reassessing patient's behavior after application of the current treatment plan. States she does not feel comfortable transporting/being alone with the patient with concerns for her safety. Prefers ambulance transport once placement is confirmed.

## 2024-07-12 DIAGNOSIS — Z789 Other specified health status: Secondary | ICD-10-CM | POA: Diagnosis not present

## 2024-07-12 DIAGNOSIS — F01518 Vascular dementia, unspecified severity, with other behavioral disturbance: Secondary | ICD-10-CM | POA: Diagnosis present

## 2024-07-12 DIAGNOSIS — Z888 Allergy status to other drugs, medicaments and biological substances status: Secondary | ICD-10-CM | POA: Diagnosis not present

## 2024-07-12 DIAGNOSIS — W06XXXA Fall from bed, initial encounter: Secondary | ICD-10-CM | POA: Diagnosis present

## 2024-07-12 DIAGNOSIS — F0153 Vascular dementia, unspecified severity, with mood disturbance: Secondary | ICD-10-CM | POA: Diagnosis present

## 2024-07-12 DIAGNOSIS — Z79890 Hormone replacement therapy (postmenopausal): Secondary | ICD-10-CM | POA: Diagnosis not present

## 2024-07-12 DIAGNOSIS — R451 Restlessness and agitation: Secondary | ICD-10-CM | POA: Diagnosis not present

## 2024-07-12 DIAGNOSIS — F03918 Unspecified dementia, unspecified severity, with other behavioral disturbance: Secondary | ICD-10-CM | POA: Diagnosis present

## 2024-07-12 DIAGNOSIS — R4689 Other symptoms and signs involving appearance and behavior: Secondary | ICD-10-CM | POA: Diagnosis not present

## 2024-07-12 DIAGNOSIS — S40011A Contusion of right shoulder, initial encounter: Secondary | ICD-10-CM | POA: Diagnosis present

## 2024-07-12 DIAGNOSIS — G8929 Other chronic pain: Secondary | ICD-10-CM | POA: Diagnosis present

## 2024-07-12 DIAGNOSIS — R296 Repeated falls: Secondary | ICD-10-CM | POA: Diagnosis present

## 2024-07-12 DIAGNOSIS — I129 Hypertensive chronic kidney disease with stage 1 through stage 4 chronic kidney disease, or unspecified chronic kidney disease: Secondary | ICD-10-CM | POA: Diagnosis present

## 2024-07-12 DIAGNOSIS — S40012A Contusion of left shoulder, initial encounter: Secondary | ICD-10-CM | POA: Diagnosis present

## 2024-07-12 DIAGNOSIS — M21372 Foot drop, left foot: Secondary | ICD-10-CM | POA: Diagnosis present

## 2024-07-12 DIAGNOSIS — Z96642 Presence of left artificial hip joint: Secondary | ICD-10-CM | POA: Diagnosis present

## 2024-07-12 DIAGNOSIS — N200 Calculus of kidney: Secondary | ICD-10-CM | POA: Diagnosis present

## 2024-07-12 DIAGNOSIS — Z7189 Other specified counseling: Secondary | ICD-10-CM | POA: Diagnosis not present

## 2024-07-12 DIAGNOSIS — I2510 Atherosclerotic heart disease of native coronary artery without angina pectoris: Secondary | ICD-10-CM | POA: Diagnosis present

## 2024-07-12 DIAGNOSIS — Z8673 Personal history of transient ischemic attack (TIA), and cerebral infarction without residual deficits: Secondary | ICD-10-CM | POA: Diagnosis not present

## 2024-07-12 DIAGNOSIS — Z8249 Family history of ischemic heart disease and other diseases of the circulatory system: Secondary | ICD-10-CM | POA: Diagnosis not present

## 2024-07-12 DIAGNOSIS — Z515 Encounter for palliative care: Secondary | ICD-10-CM | POA: Diagnosis not present

## 2024-07-12 DIAGNOSIS — G9341 Metabolic encephalopathy: Secondary | ICD-10-CM | POA: Diagnosis present

## 2024-07-12 DIAGNOSIS — Z79899 Other long term (current) drug therapy: Secondary | ICD-10-CM | POA: Diagnosis not present

## 2024-07-12 DIAGNOSIS — Z9181 History of falling: Secondary | ICD-10-CM | POA: Diagnosis not present

## 2024-07-12 DIAGNOSIS — S5011XA Contusion of right forearm, initial encounter: Secondary | ICD-10-CM | POA: Diagnosis present

## 2024-07-12 DIAGNOSIS — Z781 Physical restraint status: Secondary | ICD-10-CM | POA: Diagnosis not present

## 2024-07-12 DIAGNOSIS — S0083XA Contusion of other part of head, initial encounter: Secondary | ICD-10-CM | POA: Diagnosis present

## 2024-07-12 DIAGNOSIS — Z66 Do not resuscitate: Secondary | ICD-10-CM | POA: Diagnosis present

## 2024-07-12 DIAGNOSIS — E785 Hyperlipidemia, unspecified: Secondary | ICD-10-CM | POA: Diagnosis present

## 2024-07-12 LAB — BASIC METABOLIC PANEL WITH GFR
Anion gap: 12 (ref 5–15)
BUN: 18 mg/dL (ref 8–23)
CO2: 22 mmol/L (ref 22–32)
Calcium: 9.3 mg/dL (ref 8.9–10.3)
Chloride: 104 mmol/L (ref 98–111)
Creatinine, Ser: 0.87 mg/dL (ref 0.44–1.00)
GFR, Estimated: >60 mL/min (ref >60)
Glucose, Bld: 101 mg/dL — ABNORMAL HIGH (ref 70–99)
Potassium: 4.9 mmol/L (ref 3.5–5.1)
Sodium: 138 mmol/L (ref 135–145)

## 2024-07-12 LAB — CBC WITH DIFFERENTIAL/PLATELET
Abs Immature Granulocytes: 0.07 K/uL (ref 0.00–0.07)
Eosinophils Absolute: 0.1 K/uL (ref 0.0–0.5)
Eosinophils Relative: 1 %
HCT: 32.5 % — ABNORMAL LOW (ref 36.0–46.0)
Hemoglobin: 10.8 g/dL — ABNORMAL LOW (ref 12.0–15.0)
Immature Granulocytes: 1 %
Lymphocytes Relative: 30 %
Lymphs Abs: 1.9 K/uL (ref 0.7–4.0)
MCH: 32.4 pg (ref 26.0–34.0)
MCHC: 33.2 g/dL (ref 30.0–36.0)
MCV: 97.6 fL (ref 80.0–100.0)
Monocytes Absolute: 0.5 K/uL (ref 0.1–1.0)
Monocytes Relative: 8 %
Neutro Abs: 3.8 K/uL (ref 1.7–7.7)
Neutrophils Relative %: 60 %
Platelets: 172 K/uL (ref 150–400)
RBC: 3.33 MIL/uL — ABNORMAL LOW (ref 3.87–5.11)
RDW: 12.4 % (ref 11.5–15.5)
WBC: 6.3 K/uL (ref 4.0–10.5)

## 2024-07-12 LAB — URINE CULTURE: Culture: NO GROWTH

## 2024-07-12 MED ORDER — HALOPERIDOL LACTATE 5 MG/ML IJ SOLN
2.0000 mg | Freq: Four times a day (QID) | INTRAMUSCULAR | Status: DC | PRN
  Administered 2024-07-12: 2 mg via INTRAVENOUS
  Filled 2024-07-12 (×8): qty 1

## 2024-07-12 MED ORDER — HALOPERIDOL LACTATE 5 MG/ML IJ SOLN
2.0000 mg | Freq: Four times a day (QID) | INTRAMUSCULAR | Status: DC | PRN
  Administered 2024-07-15 – 2024-08-03 (×25): 2 mg via INTRAMUSCULAR
  Filled 2024-07-12 (×24): qty 1

## 2024-07-12 NOTE — Progress Notes (Signed)
Safety sitter remains at bedside. 

## 2024-07-12 NOTE — Progress Notes (Signed)
 PROGRESS NOTE  [name] FMW:[id number] DOB: 1941/08/11 DOA: 07/10/2024 PCP: Debrah Josette ORN., PA-C  HPI/Recap of past 24 hours: [name] is a 83/F w h/o progressive dementia with behavioral disturbance, TIA, HTN, HLD, depression, left lumbar radiculopathy status post decompression, chronic gait abnormality presented to the ED due to worsening confusion more than baseline with aggression/increased irritability for about 1 week. History of frequent falls from unsteady gait, recently about 4 days PTA. Patient follows neurology with [facility] and behavioral health, plans for patient to be referred to geriatric psychiatry was initiated.  In the ED, patient was hemodynamically stable, labs, UA, unremarkable.  CT head/cervical spine with no acute intracranial abnormality, CT abdomen/pelvis with no acute findings, except for 12 mm nonobstructive renal calculus on the left, no hydronephrosis, large hiatal hernia.  Daughter was concerned about UTI and also reported being  unable to care for patient at home.  Psychiatry consulted> increased Seroquel , added Haldol  as needed.      Subjective -Agitated and swinging last night, given Geodon, sedated and drowsy this morning,, Dr. Kathalene notes from yesterday reviewed  Assessment/Plan:  Dementia with worsening behavioral disturbance Increased agitation -Labs UA x-ray and other imaging studies negative for acute findings CT head negative for any acute intracranial abnormality, global atrophy noted Per chart review patient has been working with outpatient behavioral health, neurology and referred to geriatric neuropsychiatry  Inpatient psychiatric consulted, recommend adding Seroquel  75 mg at bedtime, continuing Zoloft  200 mg daily, continue trazodone 100 mg at bedtime as needed, Geodon 20 mg IM every 6 hours as needed for severe agitation as well as Haldol  Per chart review of the neurology note recommended to avoid Ativan  as it will worsen  the dementia Safety sitter Fall precautions - PT OT following - TOC consult - Palliative care consult  History of left-sided foot drop History of unsteady gait History of frequent fall CT head, CT cervical spine, CT abdomen pelvis unremarkable Reports some chronic back pain Scheduled Tylenol  x 2 days Continue fall precaution PT/OT  Essential hypertension Continue Imdur    Hyperlipidemia Continue Zocor    History of TIA Continue aspirin  and Zocor       Estimated body mass index is 20.25 kg/m as calculated from the following:   Height as of this encounter: 5' 4 (1.626 m).   Weight as of this encounter: 53.5 kg.     Code Status: Full  Family Communication: None at bedside, called and updated daughter Melanie  Disposition: To be determined, TOC consulting    Consultants: Psychiatry  Procedures: None  Antimicrobials: None  DVT prophylaxis: Lovenox    Objective: Vitals:   07/11/24 0800 07/11/24 1152 07/11/24 1947 07/12/24 0156  BP: 124/76 113/65 (!) 144/101 134/83  Pulse: 70 66 77 80  Resp:   18 18  Temp: 97.9 F (36.6 C) 98.2 F (36.8 C) 97.9 F (36.6 C) 98 F (36.7 C)  TempSrc: Oral  Oral Oral  SpO2: 95% 95% 98% 94%  Weight:  53.5 kg    Height:  5' 4 (1.626 m)      Intake/Output Summary (Last 24 hours) at 07/12/2024 1035 Last data filed at 07/11/2024 1830 Gross per 24 hour  Intake 120 ml  Output --  Net 120 ml   Filed Weights   07/11/24 1152  Weight: 53.5 kg    Exam:  General: s sleeping, got Geodon earlier this morning, Cardiovascular: S1, S2 present Respiratory: CTAB Abdomen: Soft, nontender, nondistended, bowel sounds present Musculoskeletal: No bilateral pedal edema  noted Skin: Normal Psychiatry: Unable to assess   Data Reviewed: CBC: Recent Labs  Lab 07/10/24 2015 07/11/24 0328 07/12/24 0134  WBC 5.6 7.2 6.3  NEUTROABS 3.5  --  3.8  HGB 11.9* 13.0 10.8*  HCT 36.0 38.5 32.5*  MCV 98.6 96.5 97.6  PLT 158 162 172    Basic Metabolic Panel: Recent Labs  Lab 07/10/24 2015 07/11/24 0328 07/12/24 0134  NA 140 138 138  K 3.9 3.7 4.9  CL 105 103 104  CO2 26 23 22   GLUCOSE 91 109* 101*  BUN 20 15 18   CREATININE 0.76 0.70 0.87  CALCIUM  9.2 9.4 9.3   GFR: Estimated Creatinine Clearance: 42.1 mL/min (by C-G formula based on SCr of 0.87 mg/dL). Liver Function Tests: Recent Labs  Lab 07/10/24 2015 07/11/24 0328  AST 26 29  ALT 19 21  ALKPHOS 110 107  BILITOT 0.5 0.8  PROT 6.3* 6.7  ALBUMIN 3.8 4.0   Recent Labs  Lab 07/10/24 2015  LIPASE 31   No results for input(s): AMMONIA in the last 168 hours. Coagulation Profile: No results for input(s): INR, PROTIME in the last 168 hours. Cardiac Enzymes: No results for input(s): CKTOTAL, CKMB, CKMBINDEX, TROPONINI in the last 168 hours. BNP (last 3 results) No results for input(s): PROBNP in the last 8760 hours. HbA1C: No results for input(s): HGBA1C in the last 72 hours. CBG: No results for input(s): GLUCAP in the last 168 hours. Lipid Profile: No results for input(s): CHOL, HDL, LDLCALC, TRIG, CHOLHDL, LDLDIRECT in the last 72 hours. Thyroid Function Tests: No results for input(s): TSH, T4TOTAL, FREET4, T3FREE, THYROIDAB in the last 72 hours. Anemia Panel: No results for input(s): VITAMINB12, FOLATE, FERRITIN, TIBC, IRON, RETICCTPCT in the last 72 hours. Urine analysis:    Component Value Date/Time   COLORURINE YELLOW 07/10/2024 1728   APPEARANCEUR CLEAR 07/10/2024 1728   LABSPEC 1.024 07/10/2024 1728   PHURINE 5.0 07/10/2024 1728   GLUCOSEU NEGATIVE 07/10/2024 1728   HGBUR NEGATIVE 07/10/2024 1728   BILIRUBINUR NEGATIVE 07/10/2024 1728   KETONESUR NEGATIVE 07/10/2024 1728   PROTEINUR NEGATIVE 07/10/2024 1728   UROBILINOGEN 1.0 07/10/2014 0915   NITRITE NEGATIVE 07/10/2024 1728   LEUKOCYTESUR NEGATIVE 07/10/2024 1728   Sepsis  Labs: @LABRCNTIP (procalcitonin:4,lacticidven:4)  )No results found for this or any previous visit (from the past 240 hours).    Studies: No results found.   Scheduled Meds:  acetaminophen   650 mg Oral Q8H   aspirin   81 mg Oral Daily   enoxaparin  (LOVENOX ) injection  40 mg Subcutaneous Q24H   feeding supplement  237 mL Oral BID BM   isosorbide  mononitrate  60 mg Oral Daily   levothyroxine   75 mcg Oral Q0600   QUEtiapine   25 mg Oral BID   QUEtiapine   75 mg Oral QHS   sertraline   200 mg Oral Daily   simvastatin   40 mg Oral QHS   sodium chloride  flush  3 mL Intravenous Q12H   sodium chloride  flush  3 mL Intravenous Q12H    Continuous Infusions:     LOS: 0 days     Sigurd Pac, MD [facility]  If 7PM-7AM, please contact night-coverage [email or web address] 07/12/2024, 10:35 AM

## 2024-07-12 NOTE — Consult Note (Signed)
 Consultation Note Date: 07/12/2024   Patient Name: [name]  DOB: [birth year]  MRN: [id number]  Age / Sex: 83 y.o., female  PCP: Debrah Josette ORN., PA-C Referring Physician: Fairy Frames, MD  Reason for Consultation: Establishing goals of care  HPI/Patient Profile: 83 y.o. female  with past medical history of dementia with behavioral disturbance, TIA, essential HTN, HLD, depression, left lumbar radiculopathy s/p decompression, left foot drop admitted on 07/10/2024 with concern for UTI (UA negative) and worsening agitation and dementia. Required Geodon in ED.   Clinical Assessment and Goals of Care: Consult received and extensive chart review completed. Noted history of chronic back pain with spinal stimulator. Noted social work follow up with mention that family has been working with elder law in preparation for potential facility care needs. They have been weaning her off lorazepam  with concerns that this is worsening her agitation at home. Outpatient provider has made referral for home hospice support.   I came to bedside and updated by safety sitter. I went to Ms. Bascom's bedside but stayed a safe distance away so as not to agitate her. NT shares that she becomes extremely agitated anytime someone tries to touch her. I greeted Ms. Virgen and she greets me back I'm good but you're good for nothing. I asked her if she is having any pain or if her back is hurting and she replied no I'm not hurting - your back is hurting. I left her at that time.   I called and spoke with daughter, [name]. [name] and I discuss her mother's illness. She has sitters in the day as [name] works and [name] is there in the evenings. [name] shares that her mother has been ambulating and was eating well at home. She was not sleeping well but was sleeping with [name] at times and would do better with her. [name] shares that she  is often her mother's comfort when she gets confused and scared and she can calm her.   [name] shares that her mother suddenly became increasingly worse ~2 weeks ago and had significant paranoia and hallucinations. She was still taking most of her medications with occasional refusal but not consistent to have withdrawal from anything. [name] shares that she loves ice cream and never declines ice cream. She shares that she usually takes her medications if told it is her nerve pill. There were no triggers or catalysts noted that lead to decline. She does have baseline anxiety with difficult temperament. [name] shares that her mother has never been touchy/feely and could be described as mean at baseline but not combative or anything to this extent.   [name] is tearful talking about her mother's decline. She speaks like she would want her to return home but not at the risk of her safety in the home (as well as the sitters who were having to sit outside for the most part of the past 2 weeks). Discussed need for ongoing symptom management. Discussed poor intake and need to consider her mother's wishes if she were to  decline further. [name] shares that her sister, [name], is POA and I encouraged that we set a time to discuss together code status and goals of care and [name] agrees this would be helpful. She mentions a time on Friday that would likely work for them.   All questions/concerns addressed. Emotional support provided.   Primary Decision Maker HCPOA     SUMMARY OF RECOMMENDATIONS   - Ongoing goals of care discussions - Ongoing need for symptom management of severe agitation  Code Status/Advance Care Planning: Full code - need to discuss further   Symptom Management:  Agitation: Agree with Seroquel  increase ([name] reports this seemed to be the most beneficial to her mother at home). Would recommend IV haldol  or IM Zyprexa if refusing Seroquel  to ensure consistent medications for improved  symptom management.   Prognosis:  Unable to determine  Discharge Planning: To Be Determined      Primary Diagnoses: Present on Admission:  Altered mental status  Dementia with behavioral disturbance (HCC)  Hyperlipidemia  Left foot drop   I have reviewed the medical record, interviewed the patient and family, and examined the patient. The following aspects are pertinent.  Past Medical History:  Diagnosis Date   Arthritis    Chest pain    Chronic back pain    neurostimulator electrode leads overlie the lower thoracic spinal canal - per cxr report 07/10/14   Chronic kidney disease    kidney stone   Chronic UTI (urinary tract infection)    Coronary artery disease    NONOBSTRUCTIVE   Dementia (HCC)    Depression    Dizziness    GERD (gastroesophageal reflux disease)    Headache    History of kidney stones    Hyperlipidemia    Hypertension    Left foot drop    SINCE 2009 - RELATED TO BACK PROBLEM - WEARS BRACE   PONV (postoperative nausea and vomiting)    Spinal stenosis    Thyroid disease    hypothryoid   Social History   Socioeconomic History   Marital status: Widowed    Spouse name: Not on file   Number of children: 2   Years of education: 12   Highest education level: High school graduate  Occupational History   Occupation: Retired  Tobacco Use   Smoking status: Never   Smokeless tobacco: Never  Vaping Use   Vaping status: Never Used  Substance and Sexual Activity   Alcohol use: No    Alcohol/week: 0.0 standard drinks of alcohol   Drug use: No   Sexual activity: Not Currently  Other Topics Concern   Not on file  Social History Narrative   Lives with her granddaughter.   Left-handed.   No caffeine use.   Social Drivers of Corporate investment banker Strain: Not on file  Food Insecurity: Patient Unable To Answer (07/11/2024)   Hunger Vital Sign    Worried About Running Out of Food in the Last Year: Patient unable to answer    Ran Out of  Food in the Last Year: Patient unable to answer  Transportation Needs: Patient Unable To Answer (07/11/2024)   PRAPARE - Transportation    Lack of Transportation (Medical): Patient unable to answer    Lack of Transportation (Non-Medical): Patient unable to answer  Physical Activity: Not on file  Stress: Not on file  Social Connections: Unknown (07/11/2024)   Social Connection and Isolation Panel    Frequency of Communication with Friends and Family: Patient unable  to answer    Frequency of Social Gatherings with Friends and Family: Patient unable to answer    Attends Religious Services: Patient unable to answer    Active Member of Clubs or Organizations: Not on file    Attends Banker Meetings: Patient unable to answer    Marital Status: Patient unable to answer   Family History  Problem Relation Age of Onset   Cancer Mother        started in gallbladder then spread to liver   Heart attack Father    Colon cancer Neg Hx    Esophageal cancer Neg Hx    Rectal cancer Neg Hx    Stomach cancer Neg Hx    Scheduled Meds:  acetaminophen   650 mg Oral Q8H   aspirin   81 mg Oral Daily   enoxaparin  (LOVENOX ) injection  40 mg Subcutaneous Q24H   feeding supplement  237 mL Oral BID BM   isosorbide  mononitrate  60 mg Oral Daily   levothyroxine   75 mcg Oral Q0600   QUEtiapine   25 mg Oral BID   QUEtiapine   75 mg Oral QHS   sertraline   200 mg Oral Daily   simvastatin   40 mg Oral QHS   sodium chloride  flush  3 mL Intravenous Q12H   sodium chloride  flush  3 mL Intravenous Q12H   Continuous Infusions: PRN Meds:.haloperidol  lactate **OR** haloperidol  lactate, ondansetron  **OR** ondansetron  (ZOFRAN ) IV, sodium chloride  flush, traZODone, ziprasidone Allergies  Allergen Reactions   Ativan  [Lorazepam ] Other (See Comments)    Per daughter it makes the patient more agitated    Nsaids Other (See Comments)    Contraindicated per MD    Review of Systems  Unable to perform ROS: Dementia     Physical Exam Vitals and nursing note reviewed.  Constitutional:      General: She is awake. She is not in acute distress. Cardiovascular:     Rate and Rhythm: Normal rate.  Pulmonary:     Effort: No tachypnea, accessory muscle usage or respiratory distress.  Abdominal:     General: Abdomen is flat.  Neurological:     Comments: Does not cooperate with assessment but seems to have baseline confusion due to dementia     Vital Signs: BP (!) 195/110 (BP Location: Right Leg)   Pulse 83   Temp 98 F (36.7 C) (Oral)   Resp 18   Ht 5' 4 (1.626 m)   Wt 53.5 kg   SpO2 94%   BMI 20.25 kg/m  Pain Scale: 0-10   Pain Score: 2    SpO2: SpO2: 94 % O2 Device:SpO2: 94 % O2 Flow Rate: .   IO: Intake/output summary:  Intake/Output Summary (Last 24 hours) at 07/12/2024 1251 Last data filed at 07/11/2024 1830 Gross per 24 hour  Intake 120 ml  Output --  Net 120 ml    LBM: Last BM Date : 07/11/24 Baseline Weight: Weight: 53.5 kg Most recent weight: Weight: 53.5 kg     Palliative Assessment/Data:     Time Total: 80 min  Greater than 50%  of this time was spent counseling and coordinating care related to the above assessment and plan.  Signed by: [name], NP Palliative Medicine Team Pager # [phone] (M-F 8a-5p) Team Phone # [phone] (Nights/Weekends)

## 2024-07-12 NOTE — Plan of Care (Signed)
  Problem: Education: Goal: Knowledge of General Education information will improve Description: Including pain rating scale, medication(s)/side effects and non-pharmacologic comfort measures Outcome: Not Progressing   Problem: Health Behavior/Discharge Planning: Goal: Ability to manage health-related needs will improve Outcome: Not Progressing   Problem: Nutrition: Goal: Adequate nutrition will be maintained Outcome: Not Progressing   Problem: Coping: Goal: Level of anxiety will decrease Outcome: Not Progressing   Problem: Pain Managment: Goal: General experience of comfort will improve and/or be controlled Outcome: Not Progressing   Problem: Clinical Measurements: Goal: Ability to maintain clinical measurements within normal limits will improve Outcome: Progressing Goal: Will remain free from infection Outcome: Progressing Goal: Diagnostic test results will improve Outcome: Progressing Goal: Respiratory complications will improve Outcome: Progressing Goal: Cardiovascular complication will be avoided Outcome: Progressing   Problem: Activity: Goal: Risk for activity intolerance will decrease Outcome: Progressing   Problem: Elimination: Goal: Will not experience complications related to bowel motility Outcome: Progressing Goal: Will not experience complications related to urinary retention Outcome: Progressing   Problem: Safety: Goal: Ability to remain free from injury will improve Outcome: Progressing   Problem: Skin Integrity: Goal: Risk for impaired skin integrity will decrease Outcome: Progressing

## 2024-07-12 NOTE — Progress Notes (Signed)
 Patient  became agitated after staff wouldn't let her leave facility. Hitting and kicking staff. Unable to verbally deescalate. PRN medication given. Will continue to assess and monitor patient.

## 2024-07-12 NOTE — Plan of Care (Signed)
  Problem: Clinical Measurements: Goal: Ability to maintain clinical measurements within normal limits will improve Outcome: Progressing Goal: Will remain free from infection Outcome: Progressing Goal: Diagnostic test results will improve Outcome: Progressing Goal: Respiratory complications will improve Outcome: Progressing Goal: Cardiovascular complication will be avoided Outcome: Progressing   Problem: Activity: Goal: Risk for activity intolerance will decrease Outcome: Progressing   Problem: Elimination: Goal: Will not experience complications related to bowel motility Outcome: Progressing Goal: Will not experience complications related to urinary retention Outcome: Progressing   Problem: Pain Managment: Goal: General experience of comfort will improve and/or be controlled Outcome: Progressing   Problem: Safety: Goal: Ability to remain free from injury will improve Outcome: Progressing   Problem: Skin Integrity: Goal: Risk for impaired skin integrity will decrease Outcome: Progressing   Problem: Education: Goal: Knowledge of General Education information will improve Description: Including pain rating scale, medication(s)/side effects and non-pharmacologic comfort measures Outcome: Not Progressing   Problem: Health Behavior/Discharge Planning: Goal: Ability to manage health-related needs will improve Outcome: Not Progressing   Problem: Nutrition: Goal: Adequate nutrition will be maintained Outcome: Not Progressing   Problem: Coping: Goal: Level of anxiety will decrease Outcome: Not Progressing

## 2024-07-12 NOTE — Progress Notes (Signed)
 Patient agitated. Verbally abusive to staff. Patient up in room with unsteady gait with intentions to leave. Sitter for safety with patient. Patient not following commands. Poor safety awareness. Will continue to attempt to deescalate patients agitation.

## 2024-07-13 DIAGNOSIS — Z7189 Other specified counseling: Secondary | ICD-10-CM | POA: Diagnosis not present

## 2024-07-13 DIAGNOSIS — Z515 Encounter for palliative care: Secondary | ICD-10-CM | POA: Diagnosis not present

## 2024-07-13 DIAGNOSIS — F03918 Unspecified dementia, unspecified severity, with other behavioral disturbance: Secondary | ICD-10-CM | POA: Diagnosis not present

## 2024-07-13 NOTE — Progress Notes (Signed)
 Patient currently sleeping in bed. Bed alarm on. Resp. Even and unlabored on room air.

## 2024-07-13 NOTE — Progress Notes (Addendum)
 Patient ambulating with unsteady gait in hallway. Sitter for safety following closely. Patient trying to go in other patients rooms, angry that we are keeping her from entering. Patient not redirectable. Will continue to try and deescalate verbally. Alert and Oriented to self. Patient pulled out IV.

## 2024-07-13 NOTE — Progress Notes (Signed)
 Sitter for safety remains at bedside

## 2024-07-13 NOTE — Plan of Care (Signed)
  Problem: Education: Goal: Knowledge of General Education information will improve Description: Including pain rating scale, medication(s)/side effects and non-pharmacologic comfort measures Outcome: Not Progressing   Problem: Health Behavior/Discharge Planning: Goal: Ability to manage health-related needs will improve Outcome: Not Progressing   Problem: Clinical Measurements: Goal: Ability to maintain clinical measurements within normal limits will improve Outcome: Not Progressing Goal: Diagnostic test results will improve Outcome: Not Progressing Goal: Respiratory complications will improve Outcome: Not Progressing Goal: Cardiovascular complication will be avoided Outcome: Not Progressing   Problem: Nutrition: Goal: Adequate nutrition will be maintained Outcome: Not Progressing   Problem: Coping: Goal: Level of anxiety will decrease Outcome: Not Progressing   Problem: Elimination: Goal: Will not experience complications related to bowel motility Outcome: Not Progressing Goal: Will not experience complications related to urinary retention Outcome: Not Progressing   Problem: Pain Managment: Goal: General experience of comfort will improve and/or be controlled Outcome: Not Progressing   Problem: Safety: Goal: Ability to remain free from injury will improve Outcome: Not Progressing   Problem: Skin Integrity: Goal: Risk for impaired skin integrity will decrease Outcome: Not Progressing   Problem: Clinical Measurements: Goal: Will remain free from infection Outcome: Progressing   Problem: Activity: Goal: Risk for activity intolerance will decrease Outcome: Progressing

## 2024-07-13 NOTE — Progress Notes (Signed)
 Progress Note   Patient: [name] FMW:[id number] DOB: Jan 13, 1941 DOA: 07/10/2024     1 DOS: the patient was seen and examined on 07/13/2024   Brief hospital course: From HPI Dayanne Yiu Holstine is a 83/F w h/o progressive dementia with behavioral disturbance, TIA, HTN, HLD, depression, left lumbar radiculopathy status post decompression, chronic gait abnormality presented to the ED due to worsening confusion more than baseline with aggression/increased irritability for about 1 week. History of frequent falls from unsteady gait, recently about 4 days PTA. Patient follows neurology with [facility] and behavioral health, plans for patient to be referred to geriatric psychiatry was initiated.  In the ED, patient was hemodynamically stable, labs, UA, unremarkable.  CT head/cervical spine with no acute intracranial abnormality, CT abdomen/pelvis with no acute findings, except for 12 mm nonobstructive renal calculus on the left, no hydronephrosis, large hiatal hernia.  Daughter was concerned about UTI and also reported being  unable to care for patient at home.  Psychiatry consulted> increased Seroquel , added Haldol  as needed.      Assessment and Plan:  Dementia with worsening behavioral disturbance Increased agitation Per chart review patient has been working with outpatient behavioral health, neurology and referred to geriatric neuropsychiatry  Inpatient psychiatric consulted, recommend adding Seroquel  75 mg at bedtime, continuing Zoloft  200 mg daily, continue trazodone 100 mg at bedtime as needed, Geodon 20 mg IM every 6 hours as needed for severe agitation as well as Haldol  Continue fall precaution Continue PT OT Continue TOC and SNF bed search Palliative care on board   History of left-sided foot drop History of unsteady gait History of frequent fall CT head, CT cervical spine, CT abdomen pelvis unremarkable Reports some chronic back pain Scheduled Tylenol  x 2 days Continue fall  precaution PT/OT   Essential hypertension Continue Imdur    Hyperlipidemia Continue Zocor    History of TIA Continue aspirin  and Zocor    Code Status: Full   Family Communication: None at bedside, called and updated daughter [name]   Disposition: SNF  Subjective:  Patient seen and examined at bedside this morning Chronically ill-appearing Denies nausea vomiting chest pain cough Have been seen by PT OT with recommendation for skilled nursing facility placement  Physical Exam: Vitals:   07/12/24 1646 07/12/24 1950 07/13/24 0453 07/13/24 0945  BP: 136/66 (!) 133/94 134/73 (!) 160/80  Pulse: 72 89 70 77  Resp:  16 16 20   Temp: 97.6 F (36.4 C) 97.8 F (36.6 C) 97.9 F (36.6 C) (!) 97.1 F (36.2 C)  TempSrc: Oral Oral Oral   SpO2: 95% 97% 96% 96%  Weight:      Height:       General: Calm laying in bed in no acute distress Cardiovascular: S1, S2 present Respiratory: CTAB Abdomen: Soft, nontender, nondistended, bowel sounds present Musculoskeletal: No bilateral pedal edema noted Skin: Normal Psychiatry: Normal mood  Data Reviewed:    Latest Ref Rng & Units 07/12/2024    1:34 AM 07/11/2024    3:28 AM 07/10/2024    8:15 PM  CBC  WBC 4.0 - 10.5 K/uL 6.3  7.2  5.6   Hemoglobin 12.0 - 15.0 g/dL 89.1  86.9  88.0   Hematocrit 36.0 - 46.0 % 32.5  38.5  36.0   Platelets 150 - 400 K/uL 172  162  158        Latest Ref Rng & Units 07/12/2024    1:34 AM 07/11/2024    3:28 AM 07/10/2024    8:15 PM  BMP  Glucose 70 - 99 mg/dL 898  890  91   BUN 8 - 23 mg/dL 18  15  20    Creatinine 0.44 - 1.00 mg/dL 9.12  9.29  9.23   Sodium 135 - 145 mmol/L 138  138  140   Potassium 3.5 - 5.1 mmol/L 4.9  3.7  3.9   Chloride 98 - 111 mmol/L 104  103  105   CO2 22 - 32 mmol/L 22  23  26    Calcium  8.9 - 10.3 mg/dL 9.3  9.4  9.2       Author: Drue ONEIDA Potter, MD 07/13/2024 3:11 PM  For on call review [email or web address].

## 2024-07-13 NOTE — Progress Notes (Signed)
 Palliative:  HPI: 83 y.o. female  with past medical history of dementia with behavioral disturbance, TIA, essential HTN, HLD, depression, left lumbar radiculopathy s/p decompression, left foot drop admitted on 07/10/2024 with concern for UTI (UA negative) and worsening agitation and dementia. Required Geodon in ED.   I met today again with [name]. She is sitting up in chair. Breakfast is in front of her. She appears to have taken maybe a couple bites and sips but mostly untouched. She is very withdrawn today. She answers some of my questions with simple yes or no answers spoken very softly. She denies being hungry or wanting anything else. I tried to ask about [name] or if there was anything she wanted me to tell [name] but no response.   Discussed with RN later in day. RN reports that [name] has been withdrawn today. Less agitation and not combative. Refusing medications and very little intake.   I attempted to call [name] but no answer. I left voicemail. We had tentative plans for conference call tomorrow with her sister, [name], to discuss goals of care. I believe she mentioned she would be available tomorrow 12-2p? I left voicemail offering to talk at 1200 noon (I do have a meeting at 1pm). I left her my return contact to confirm. I will reach out again in the morning to solidify plans and timing of further goals of care discussion.   Exam: Alert but withdrawn. Unable to assess orientation due to cooperation. Not agitated today. Avoids eye contact. Breathing regular, unlabored. Abd flat.  Plan: - Trying to coordinate family goals of care conversation hopefull for tomorrow 10/17  25 min  Jocelyn Kitty, NP Palliative Medicine Team Pager [phone] (Please see [email or web address] for schedule) Team Phone [phone]

## 2024-07-13 NOTE — Progress Notes (Signed)
 Patient agitated. When approached by staff patient becomes verbally aggressive. Refuses PO medications. Poor safety awareness. Impulsive. Unsteady gait. Sitter for safety at bedside. Food/fluids offered but patient refused. Will continue to monitor.

## 2024-07-13 NOTE — Plan of Care (Signed)
  Problem: Clinical Measurements: Goal: Will remain free from infection Outcome: Progressing Goal: Diagnostic test results will improve Outcome: Progressing Goal: Respiratory complications will improve Outcome: Progressing Goal: Cardiovascular complication will be avoided Outcome: Progressing   Problem: Activity: Goal: Risk for activity intolerance will decrease Outcome: Progressing   Problem: Elimination: Goal: Will not experience complications related to bowel motility Outcome: Progressing Goal: Will not experience complications related to urinary retention Outcome: Progressing   Problem: Pain Managment: Goal: General experience of comfort will improve and/or be controlled Outcome: Progressing   Problem: Safety: Goal: Ability to remain free from injury will improve Outcome: Progressing   Problem: Skin Integrity: Goal: Risk for impaired skin integrity will decrease Outcome: Progressing   Problem: Education: Goal: Knowledge of General Education information will improve Description: Including pain rating scale, medication(s)/side effects and non-pharmacologic comfort measures Outcome: Not Progressing   Problem: Health Behavior/Discharge Planning: Goal: Ability to manage health-related needs will improve Outcome: Not Progressing   Problem: Clinical Measurements: Goal: Ability to maintain clinical measurements within normal limits will improve Outcome: Not Progressing   Problem: Nutrition: Goal: Adequate nutrition will be maintained Outcome: Not Progressing   Problem: Coping: Goal: Level of anxiety will decrease Outcome: Not Progressing

## 2024-07-14 DIAGNOSIS — Z7189 Other specified counseling: Secondary | ICD-10-CM | POA: Diagnosis not present

## 2024-07-14 DIAGNOSIS — Z515 Encounter for palliative care: Secondary | ICD-10-CM | POA: Diagnosis not present

## 2024-07-14 DIAGNOSIS — Z66 Do not resuscitate: Secondary | ICD-10-CM | POA: Diagnosis not present

## 2024-07-14 DIAGNOSIS — F03918 Unspecified dementia, unspecified severity, with other behavioral disturbance: Secondary | ICD-10-CM | POA: Diagnosis not present

## 2024-07-14 LAB — BASIC METABOLIC PANEL WITH GFR
Anion gap: 10 (ref 5–15)
BUN: 23 mg/dL (ref 8–23)
CO2: 25 mmol/L (ref 22–32)
Calcium: 9.4 mg/dL (ref 8.9–10.3)
Chloride: 105 mmol/L (ref 98–111)
Creatinine, Ser: 0.78 mg/dL (ref 0.44–1.00)
GFR, Estimated: >60 mL/min (ref >60)
Glucose, Bld: 85 mg/dL (ref 70–99)
Potassium: 3.8 mmol/L (ref 3.5–5.1)
Sodium: 140 mmol/L (ref 135–145)

## 2024-07-14 LAB — CBC
HCT: 34.2 % — ABNORMAL LOW (ref 36.0–46.0)
Hemoglobin: 11.3 g/dL — ABNORMAL LOW (ref 12.0–15.0)
MCH: 32.4 pg (ref 26.0–34.0)
MCHC: 33 g/dL (ref 30.0–36.0)
MCV: 98 fL (ref 80.0–100.0)
Platelets: 179 K/uL (ref 150–400)
RBC: 3.49 MIL/uL — ABNORMAL LOW (ref 3.87–5.11)
RDW: 12.4 % (ref 11.5–15.5)
WBC: 5.8 K/uL (ref 4.0–10.5)
nRBC: 0 % (ref 0.0–0.2)

## 2024-07-14 MED ORDER — HALOPERIDOL LACTATE 5 MG/ML IJ SOLN: 2.0000 mg | Freq: Every day | INTRAMUSCULAR | Status: DC

## 2024-07-14 MED ORDER — QUETIAPINE FUMARATE 25 MG PO TABS
25.0000 mg | ORAL_TABLET | Freq: Two times a day (BID) | ORAL | Status: DC
Start: 2024-07-14 — End: 2024-07-17
  Administered 2024-07-15 – 2024-07-16 (×4): 25 mg via ORAL
  Filled 2024-07-14 (×5): qty 1

## 2024-07-14 MED ORDER — HALOPERIDOL LACTATE 5 MG/ML IJ SOLN
1.0000 mg | Freq: Two times a day (BID) | INTRAMUSCULAR | Status: DC
  Administered 2024-07-14: 1 mg via INTRAVENOUS
  Filled 2024-07-14: qty 1

## 2024-07-14 MED ORDER — QUETIAPINE FUMARATE 25 MG PO TABS
75.0000 mg | ORAL_TABLET | Freq: Every day | ORAL | Status: DC
  Administered 2024-07-14 – 2024-07-16 (×3): 75 mg via ORAL
  Filled 2024-07-14 (×3): qty 3

## 2024-07-14 NOTE — Hospital Course (Signed)
 83/F w h/o progressive dementia with behavioral disturbance, TIA, HTN, HLD, depression, left lumbar radiculopathy status post decompression, chronic gait abnormality presented to the ED due to worsening confusion more than baseline with aggression/increased irritability for about 1 week. History of frequent falls from unsteady gait, recently about 4 days PTA. Patient follows neurology with [facility] and behavioral health, plans for patient to be referred to geriatric psychiatry was initiated. In the ED, patient was hemodynamically stable, labs, UA, unremarkable. CT head/cervical spine with no acute intracranial abnormality, CT abdomen/pelvis with no acute findings, except for 12 mm nonobstructive renal calculus on the left, no hydronephrosis, large hiatal hernia. Daughter was concerned about UTI and also reported being unable to care for patient at home. Psychiatry consulted> increased Seroquel , added Haldol  as needed

## 2024-07-14 NOTE — Progress Notes (Addendum)
 Palliative:  HPI: 83 y.o. female  with past medical history of dementia with behavioral disturbance, TIA, essential HTN, HLD, depression, left lumbar radiculopathy s/p decompression, left foot drop admitted on 07/10/2024 with concern for UTI (UA negative) and worsening agitation and dementia. Required Geodon in ED.   I met today at [name]'s bedside. She does not make eye contact or engage in conversation. She only answers one question with delay when I asked do you need anything - I guess not. She is fixated on wash clothe and moving food around plate. She did eat some breakfast this morning - maybe 50%. Still refusing medications. Not as combative or agitated. She did push PT away and NT away initially but they were ultimately able to work with her and get vital signs.   I had conference call with 2 daughters, [name] (POA) and [name]. We spent time reviewing progress and their mother's situation. Unfortunately she is not doing well. I express my concern for refusal of medications and poor intake and if this continues it can lead to end of life. I discussed the importance of having a conversation about what we should do if she declines further. I recommended DNR status. I recommended that we should not escalate care to any invasive measures including ICU or ICU level interventions as this would be extremely difficult on [name] and would not lead to improved quality of life. [name] agrees with recommendations. [name] is tearful and shares that she does not know what to think but it is up to her sister.   Goal for improved symptom management and time for outcomes. [name] shares that she is torn because she wants to visit her mother but she is scared that her presence could lead to agitation especially when she leaves. Unfortunately we do not know if she would even recognize her daughters. I have mentioned their names but without any significant response from [name]. We agree to start with  telephone call and see how that goes.   [name] calls me back and shares the telephone call went okay. She said her mother seemed like she was at the beach. She does not believe her mother knew who they were. We determined that [name] has had less aggressive agitation and family will hold on in person visit as not to worsen her symptoms or status at this time. We will re-evaluate on Monday.  All questions/concerns addressed. Emotional support provided.   Exam: Alert. Distracted. No eye contact. Minimal verbal response. Confusion assumed. No distress. Breathing regular, unlabored. Abd flat. Moves all extremities.   Plan: - DNR/DNI decided - Avoid escalation of care to ICU or invasive measures  - Time for outcomes - Will order IV haldol  scheduled in place of Seroquel  if refusing pills (update - RN reports pt no longer has IV - will avoid frequent IM shots - continuing PRN haldol  and encourage Seroquel ) - I will f/u Monday - family to call palliative with any concerns over the weekend  70 min  [name], NP Palliative Medicine Team Pager [phone] (Please see [email or web address] for schedule) Team Phone [phone]

## 2024-07-14 NOTE — Plan of Care (Signed)

## 2024-07-14 NOTE — Progress Notes (Signed)
 Occupational Therapy Treatment Patient Details Name: [name] MRN: [id number] DOB: 08-Jul-1941 Today's Date: 07/14/2024   History of present illness Pt is an 83 y.o. female presented 07/10/24 due to UTI symptoms and unsteadiness with repeated falls over the last few days. UA -PMH significant for CAD, HTN, dementia with behavioral disturbances, frequent UTIs, TIA, L  lumbar radiculopathy status post decompression of the left foot drop   OT comments  Patient received in supine and appeared asleep but awakens when name is called. Patient requiring mod assist to get to EOB with assistance for BLE and initiation. Patient able to transfer to recliner with CGA to stand and min assist to transfer with RW.  Grooming and gown change performed while seated in recliner with patient participating requiring cues for sequencing.  Patient will benefit from continued inpatient follow up therapy, <3 hours/day.  Acute OT to continue to follow to address established goals to facilitate DC to next venue of care.        If plan is discharge home, recommend the following:  A little help with walking and/or transfers;A little help with bathing/dressing/bathroom;Assistance with cooking/housework;Assistance with feeding;Direct supervision/assist for medications management;Direct supervision/assist for financial management;Assist for transportation;Help with stairs or ramp for entrance;Supervision due to cognitive status   Equipment Recommendations  Wheelchair (measurements OT)    Recommendations for Other Services      Precautions / Restrictions Precautions Precautions: Fall Recall of Precautions/Restrictions: Impaired Precaution/Restrictions Comments: frequent falls in days before hospitalization Restrictions Weight Bearing Restrictions Per Provider Order: No       Mobility Bed Mobility Overal bed mobility: Needs Assistance Bed Mobility: Supine to Sit     Supine to sit: Mod assist, HOB elevated      General bed mobility comments: increased time and assistance to initiate with assistance for BLEs    Transfers Overall transfer level: Needs assistance Equipment used: Rolling walker (2 wheels) Transfers: Sit to/from Stand, Bed to chair/wheelchair/BSC Sit to Stand: Contact guard assist     Step pivot transfers: Min assist     General transfer comment: cues for hand placement and min assist for walker management     Balance Overall balance assessment: Needs assistance Sitting-balance support: Feet supported, Bilateral upper extremity supported, Single extremity supported Sitting balance-Leahy Scale: Fair Sitting balance - Comments: keeps trunk flexed but able to maintain sitting balance with supervision for safety   Standing balance support: Bilateral upper extremity supported Standing balance-Leahy Scale: Fair Standing balance comment: reliant on RW with kyphotic posture                           ADL either performed or assessed with clinical judgement   ADL Overall ADL's : Needs assistance/impaired Eating/Feeding: Minimal assistance;Sitting;Set up Eating/Feeding Details (indicate cue type and reason): to manage cup Grooming: Wash/dry hands;Wash/dry face;Supervision/safety;Sitting Grooming Details (indicate cue type and reason): in recliner         Upper Body Dressing : Minimal assistance;Sitting Upper Body Dressing Details (indicate cue type and reason): to change gown Lower Body Dressing: Maximal assistance;Sitting/lateral leans Lower Body Dressing Details (indicate cue type and reason): to donn socks                    Extremity/Trunk Assessment              Vision       Perception     Praxis     Communication Communication Communication: Impaired Factors Affecting  Communication: Other (comment) (soft spoken)   Cognition Arousal: Lethargic Behavior During Therapy: Flat affect Cognition: History of cognitive impairments              OT - Cognition Comments: answers no to most questions, limited verbalization                 Following commands: Impaired Following commands impaired: Follows one step commands inconsistently      Cueing   Cueing Techniques: Verbal cues, Visual cues  Exercises      Shoulder Instructions       General Comments      Pertinent Vitals/ Pain       Pain Assessment Pain Assessment: Faces Faces Pain Scale: No hurt Pain Intervention(s): Monitored during session  Home Living                                          Prior Functioning/Environment              Frequency  Min 2X/week        Progress Toward Goals  OT Goals(current goals can now be found in the care plan section)  Progress towards OT goals: Progressing toward goals  Acute Rehab OT Goals Patient Stated Goal: unable OT Goal Formulation: Patient unable to participate in goal setting Time For Goal Achievement: 07/25/24 Potential to Achieve Goals: Fair ADL Goals Pt Will Perform Upper Body Bathing: with supervision;sitting Pt Will Perform Lower Body Bathing: with contact guard assist;sit to/from stand Pt Will Perform Upper Body Dressing: with supervision;sitting Pt Will Perform Lower Body Dressing: with contact guard assist;sit to/from stand Pt Will Transfer to Toilet: with supervision;ambulating;regular height toilet  Plan      Co-evaluation                 AM-PAC OT 6 Clicks Daily Activity     Outcome Measure   Help from another person eating meals?: A Little Help from another person taking care of personal grooming?: A Little Help from another person toileting, which includes using toliet, bedpan, or urinal?: A Little Help from another person bathing (including washing, rinsing, drying)?: A Little Help from another person to put on and taking off regular upper body clothing?: A Little Help from another person to put on and taking off regular lower body  clothing?: A Little 6 Click Score: 18    End of Session Equipment Utilized During Treatment: Gait belt;Rolling walker (2 wheels)  OT Visit Diagnosis: Unsteadiness on feet (R26.81);Other abnormalities of gait and mobility (R26.89);Repeated falls (R29.6);Muscle weakness (generalized) (M62.81)   Activity Tolerance Patient tolerated treatment well   Patient Left in chair;with call bell/phone within reach;with nursing/sitter in room   Nurse Communication Mobility status        Time: 9250-9188 OT Time Calculation (min): 22 min  Charges: OT General Charges $OT Visit: 1 Visit OT Treatments $Self Care/Home Management : 8-22 mins  Dick Laine, OTA Acute Rehabilitation Services  Office [phone]   Jeb LITTIE Laine 07/14/2024, 11:19 AM

## 2024-07-14 NOTE — Progress Notes (Signed)
  Progress Note   Patient: [name] FMW:[id number] DOB: [birth year] DOA: 07/10/2024     2 DOS: the patient was seen and examined on 07/14/2024   Brief hospital course: 83/F w h/o progressive dementia with behavioral disturbance, TIA, HTN, HLD, depression, left lumbar radiculopathy status post decompression, chronic gait abnormality presented to the ED due to worsening confusion more than baseline with aggression/increased irritability for about 1 week. History of frequent falls from unsteady gait, recently about 4 days PTA. Patient follows neurology with [facility] and behavioral health, plans for patient to be referred to geriatric psychiatry was initiated. In the ED, patient was hemodynamically stable, labs, UA, unremarkable. CT head/cervical spine with no acute intracranial abnormality, CT abdomen/pelvis with no acute findings, except for 12 mm nonobstructive renal calculus on the left, no hydronephrosis, large hiatal hernia. Daughter was concerned about UTI and also reported being unable to care for patient at home. Psychiatry consulted> increased Seroquel , added Haldol  as needed   Assessment and Plan: Dementia with worsening behavioral disturbance Increased agitation Per chart review patient has been working with outpatient behavioral health, neurology and referred to geriatric neuropsychiatry  Inpatient psychiatric consulted, recommend adding Seroquel  75 mg at bedtime, continuing Zoloft  200 mg daily, continue trazodone 100 mg at bedtime as needed, Geodon 20 mg IM every 6 hours as needed for severe agitation as well as Haldol  Continue fall precaution Continue PT OT Continue TOC and SNF bed search Palliative care following, cont on IV haldol  scheduled if pt cont to refuse pills   History of left-sided foot drop History of unsteady gait History of frequent fall CT head, CT cervical spine, CT abdomen pelvis unremarkable Reports some chronic back pain Scheduled Tylenol  x 2  days Continue fall precaution PT/OT   Essential hypertension Continue Imdur    Hyperlipidemia Continue Zocor    History of TIA Continue aspirin  and Zocor          Subjective: Without complaints. Pleasantly confused  Physical Exam: Vitals:   07/13/24 2007 07/14/24 0450 07/14/24 0805 07/14/24 1348  BP: (!) 173/84 (!) 153/81 (!) 147/89 (!) 163/88  Pulse: 64 83 79 91  Resp: 18 18 16 18   Temp:  97.9 F (36.6 C)    TempSrc:  Oral    SpO2: 97% 94% 98% 95%  Weight:      Height:       General exam: Awake, sitting in chair, in nad Respiratory system: Normal respiratory effort, no wheezing Cardiovascular system: regular rate, s1, s2 Gastrointestinal system: Soft, nondistended, positive BS Central nervous system: CN2-12 grossly intact, strength intact Extremities: Perfused, no clubbing Skin: Normal skin turgor, no notable skin lesions seen Psychiatry: Difficult to assess given mentation  Data Reviewed:  There are no new results to review at this time.  Family Communication: Pt in room, family not at bedside  Disposition: Status is: Inpatient Remains inpatient appropriate because: severity of illness  Planned Discharge Destination: Skilled nursing facility    Author: Garnette Pelt, MD 07/14/2024 4:21 PM  For on call review [email or web address].

## 2024-07-15 ENCOUNTER — Inpatient Hospital Stay (HOSPITAL_COMMUNITY)

## 2024-07-15 DIAGNOSIS — F03918 Unspecified dementia, unspecified severity, with other behavioral disturbance: Secondary | ICD-10-CM | POA: Diagnosis not present

## 2024-07-15 MED ORDER — HALOPERIDOL LACTATE 5 MG/ML IJ SOLN
2.0000 mg | INTRAMUSCULAR | Status: AC
  Administered 2024-07-15: 2 mg via INTRAVENOUS
  Filled 2024-07-15: qty 1

## 2024-07-15 NOTE — Plan of Care (Signed)

## 2024-07-15 NOTE — Progress Notes (Signed)
 Was the fall witnessed: no  Patient condition before and after the fall: ambulatory, confused, safety sitter  Patient's reaction to the fall: no complaints of pain  Name of the doctor that was notified including date and time: Garnette Pelt, MD  Any interventions and vital signs: VSS, hip xray ordered

## 2024-07-15 NOTE — Plan of Care (Addendum)
 No 1:1 safety sitter available since 3PM 07/15/24. Floor staff at door to monitor when able but not able to provide constant bedside safety observation required. Charge RN consulted with regard to telesitter while 1:1 sitter unavailable given pt's high fall risk (most recent fall 10/18 while pt without 1:1 sitter), wandering, intermittent agitation, and intermittent combative behavior. Charge RN agrees that video safety monitoring is appropriate while 1:1 sitter unavailable, as it provides observation of pt when floor staff providing care to other pts and unable to be at pt bedside. Telesitter order placed, at this time awaiting telesitter monitoring via AMU approval and AMU availability. Primary RN at bedside with pt in direct line of sight when able to do so.   21:00  Pt refusing staff to obtain vital signs. Combative with staff: pinching, kicking, hitting. Pt would not take scheduled or PRN PO medications. Spit out most of what little she took. On call MD Dr. Terry Hurst notified of pt significant agitation, combative behavior, and constantly trying to climb out of bed. Additional dose of IM haldol  ordered and administered. Floor staff at bedside to observe pt directly when able. No telesitter or 1:1 safety observer available at this time. Bed alarm activated: alarm sensitivity setting-alarm when pt moves towards edge of bed. Nonskid socks on pt.   Problem: Education: Goal: Knowledge of General Education information will improve Description: Including pain rating scale, medication(s)/side effects and non-pharmacologic comfort measures Outcome: Progressing   Problem: Health Behavior/Discharge Planning: Goal: Ability to manage health-related needs will improve Outcome: Progressing   Problem: Clinical Measurements: Goal: Ability to maintain clinical measurements within normal limits will improve Outcome: Progressing Goal: Will remain free from infection Outcome: Progressing Goal: Diagnostic test  results will improve Outcome: Progressing Goal: Respiratory complications will improve Outcome: Progressing Goal: Cardiovascular complication will be avoided Outcome: Progressing   Problem: Activity: Goal: Risk for activity intolerance will decrease Outcome: Progressing   Problem: Nutrition: Goal: Adequate nutrition will be maintained Outcome: Progressing   Problem: Coping: Goal: Level of anxiety will decrease Outcome: Progressing   Problem: Elimination: Goal: Will not experience complications related to bowel motility Outcome: Progressing Goal: Will not experience complications related to urinary retention Outcome: Progressing   Problem: Pain Managment: Goal: General experience of comfort will improve and/or be controlled Outcome: Progressing   Problem: Safety: Goal: Ability to remain free from injury will improve Outcome: Progressing   Problem: Skin Integrity: Goal: Risk for impaired skin integrity will decrease Outcome: Progressing

## 2024-07-15 NOTE — Progress Notes (Signed)
  Progress Note   Patient: [name] FMW:[id number] DOB: 05-30-1941 DOA: 07/10/2024     3 DOS: the patient was seen and examined on 07/15/2024   Brief hospital course: 83/F w h/o progressive dementia with behavioral disturbance, TIA, HTN, HLD, depression, left lumbar radiculopathy status post decompression, chronic gait abnormality presented to the ED due to worsening confusion more than baseline with aggression/increased irritability for about 1 week. History of frequent falls from unsteady gait, recently about 4 days PTA. Patient follows neurology with [facility] and behavioral health, plans for patient to be referred to geriatric psychiatry was initiated. In the ED, patient was hemodynamically stable, labs, UA, unremarkable. CT head/cervical spine with no acute intracranial abnormality, CT abdomen/pelvis with no acute findings, except for 12 mm nonobstructive renal calculus on the left, no hydronephrosis, large hiatal hernia. Daughter was concerned about UTI and also reported being unable to care for patient at home. Psychiatry consulted> increased Seroquel , added Haldol  as needed   Assessment and Plan: Dementia with worsening behavioral disturbance Increased agitation Per chart review patient has been working with outpatient behavioral health, neurology and referred to geriatric neuropsychiatry  Inpatient psychiatric consulted, recommend adding Seroquel  75 mg at bedtime, continuing Zoloft  200 mg daily, continue trazodone 100 mg at bedtime as needed, Geodon 20 mg IM every 6 hours as needed for severe agitation as well as Haldol  Continue fall precaution Continue PT OT Continue TOC and SNF bed search Palliative care following, cont on IV haldol  scheduled if pt cont to refuse pills. Intermittently has been taking PO meds recently   History of left-sided foot drop History of unsteady gait History of frequent fall CT head, CT cervical spine, CT abdomen pelvis unremarkable Reports some  chronic back pain Scheduled Tylenol  x 2 days Continue fall precaution PT/OT   Essential hypertension Continue Imdur    Hyperlipidemia Continue Zocor    History of TIA Continue aspirin  and Zocor          Subjective: Pleasantly confused this afternoon  Physical Exam: Vitals:   07/14/24 1348 07/14/24 2036 07/14/24 2331 07/15/24 1200  BP: (!) 163/88 (!) 157/74 (!) 156/72 (!) 145/92  Pulse: 91 65 (!) 57 95  Resp: 18 16 16 17   Temp:  97.7 F (36.5 C) 98.1 F (36.7 C)   TempSrc:  Oral Oral Oral  SpO2: 95% 97% 98% 98%  Weight:      Height:       General exam: Conversant, in no acute distress Respiratory system: normal chest rise, clear, no audible wheezing Cardiovascular system: regular rhythm, s1-s2 Gastrointestinal system: Nondistended, nontender, pos BS Central nervous system: No seizures, no tremors Extremities: No cyanosis, no joint deformities Skin: No rashes, no pallor Psychiatry: Affect normal // no auditory hallucinations   Data Reviewed:  There are no new results to review at this time.  Family Communication: Pt in room, family not at bedside  Disposition: Status is: Inpatient Remains inpatient appropriate because: severity of illness  Planned Discharge Destination: Skilled nursing facility    Author: Garnette Pelt, MD 07/15/2024 5:17 PM  For on call review [email or web address].

## 2024-07-15 NOTE — Plan of Care (Signed)

## 2024-07-16 DIAGNOSIS — F03918 Unspecified dementia, unspecified severity, with other behavioral disturbance: Secondary | ICD-10-CM | POA: Diagnosis not present

## 2024-07-16 MED ORDER — ZIPRASIDONE MESYLATE 20 MG IM SOLR
20.0000 mg | Freq: Four times a day (QID) | INTRAMUSCULAR | Status: DC | PRN
  Administered 2024-07-16 – 2024-08-03 (×20): 20 mg via INTRAMUSCULAR
  Filled 2024-07-16 (×27): qty 20

## 2024-07-16 MED ORDER — STERILE WATER FOR INJECTION IJ SOLN
INTRAMUSCULAR | Status: AC
Start: 2024-07-16 — End: 2024-07-16
  Administered 2024-07-16: 10 mL
  Filled 2024-07-16: qty 10

## 2024-07-16 NOTE — Plan of Care (Addendum)
  Problem: Education: Goal: Knowledge of General Education information will improve Description: Including pain rating scale, medication(s)/side effects and non-pharmacologic comfort measures Outcome: Not Progressing   Problem: Health Behavior/Discharge Planning: Goal: Ability to manage health-related needs will improve Outcome: Not Progressing   Problem: Clinical Measurements: Goal: Ability to maintain clinical measurements within normal limits will improve Outcome: Not Progressing Goal: Will remain free from infection Outcome: Not Progressing Goal: Diagnostic test results will improve Outcome: Not Progressing Goal: Respiratory complications will improve Outcome: Not Progressing Goal: Cardiovascular complication will be avoided Outcome: Not Progressing   Problem: Activity: Goal: Risk for activity intolerance will decrease Outcome: Not Progressing   Problem: Nutrition: Goal: Adequate nutrition will be maintained Outcome: Not Progressing   Problem: Coping: Goal: Level of anxiety will decrease Outcome: Not Progressing   Problem: Elimination: Goal: Will not experience complications related to bowel motility Outcome: Not Progressing Goal: Will not experience complications related to urinary retention Outcome: Not Progressing   Problem: Pain Managment: Goal: General experience of comfort will improve and/or be controlled Outcome: Not Progressing   Problem: Safety: Goal: Ability to remain free from injury will improve Outcome: Not Progressing   Problem: Skin Integrity: Goal: Risk for impaired skin integrity will decrease Outcome: Not Progressing   Addendum:  22:55PM: Patient attempted to climb out of the bed, after soiling the bed with urine. When cleaning up the patient, patient hit and smacked the staff members. Stated don't touch me with your nasty fingers. Even when directing patient back into the bed, patient hit this RN in the arms. Geodon was given at this time  for agitation.   01:40 am: Patient continues to climb out of bed, and every time she does, staff members would get hit in the arms and stomach when assisting her back in the bed. Patient is not comprehending at this time. Informed provider about this, and restraint (body belt) was ordered. Informed daughter, DOROTHA Che regarding this intervention. Belt was placed, skin integrity maintained and bed alarm continued on with bed to the lowest position.   06:00 am: Patient has not slept during this shift. Still continues to fight off the body belt - but has remained in place since the initiation of restraint.

## 2024-07-16 NOTE — Progress Notes (Signed)
  Progress Note   Patient: [name] FMW:[id number] DOB: 1941-09-21 DOA: 07/10/2024     4 DOS: the patient was seen and examined on 07/16/2024   Brief hospital course: 83/F w h/o progressive dementia with behavioral disturbance, TIA, HTN, HLD, depression, left lumbar radiculopathy status post decompression, chronic gait abnormality presented to the ED due to worsening confusion more than baseline with aggression/increased irritability for about 1 week. History of frequent falls from unsteady gait, recently about 4 days PTA. Patient follows neurology with [facility] and behavioral health, plans for patient to be referred to geriatric psychiatry was initiated. In the ED, patient was hemodynamically stable, labs, UA, unremarkable. CT head/cervical spine with no acute intracranial abnormality, CT abdomen/pelvis with no acute findings, except for 12 mm nonobstructive renal calculus on the left, no hydronephrosis, large hiatal hernia. Daughter was concerned about UTI and also reported being unable to care for patient at home. Psychiatry consulted> increased Seroquel , added Haldol  as needed   Assessment and Plan: Dementia with worsening behavioral disturbance Increased agitation Per chart review patient has been working with outpatient behavioral health, neurology and referred to geriatric neuropsychiatry  Inpatient psychiatric consulted, recommend adding Seroquel  75 mg at bedtime, continuing Zoloft  200 mg daily, continue trazodone 100 mg at bedtime as needed, Geodon 20 mg IM every 6 hours as needed for severe agitation as well as Haldol .  Continue fall precaution Continue PT OT Continue TOC and SNF bed search Palliative care following, cont on IV haldol  scheduled if pt cont to refuse pills. Intermittently has been taking PO meds recently Unclear why geodon was discontinued. Pt very agitated today, resumed IM geodon   History of left-sided foot drop History of unsteady gait History of frequent  fall CT head, CT cervical spine, CT abdomen pelvis unremarkable Reports some chronic back pain Scheduled Tylenol  x 2 days Continue fall precaution PT/OT   Essential hypertension Continue Imdur    Hyperlipidemia Continue Zocor    History of TIA Continue aspirin  and Zocor          Subjective: more agitated this afternoon  Physical Exam: Vitals:   07/15/24 1200 07/16/24 0605 07/16/24 0754 07/16/24 1208  BP: (!) 145/92 (!) 156/63 (!) 191/81 (!) 149/97  Pulse: 95 64 73 91  Resp: 17 20 19 19   TempSrc: Oral     SpO2: 98% 96% 99% 95%  Weight:      Height:       General exam: Awake, appears agitated Respiratory system: Normal respiratory effort, no wheezing Cardiovascular system: regular rate, s1, s2 Gastrointestinal system: Soft, nondistended, positive BS Central nervous system: CN2-12 grossly intact, strength intact Extremities: Perfused, no clubbing Skin: Normal skin turgor, no notable skin lesions seen Psychiatry: difficult to assess, appears agitated  Data Reviewed:  There are no new results to review at this time.  Family Communication: Pt in room, family not at bedside  Disposition: Status is: Inpatient Remains inpatient appropriate because: severity of illness  Planned Discharge Destination: Skilled nursing facility    Author: Garnette Pelt, MD 07/16/2024 5:43 PM  For on call review [email or web address].

## 2024-07-16 NOTE — Plan of Care (Signed)

## 2024-07-16 NOTE — Plan of Care (Signed)

## 2024-07-17 DIAGNOSIS — F03918 Unspecified dementia, unspecified severity, with other behavioral disturbance: Secondary | ICD-10-CM | POA: Diagnosis not present

## 2024-07-17 DIAGNOSIS — Z7189 Other specified counseling: Secondary | ICD-10-CM | POA: Diagnosis not present

## 2024-07-17 DIAGNOSIS — R451 Restlessness and agitation: Secondary | ICD-10-CM

## 2024-07-17 DIAGNOSIS — Z515 Encounter for palliative care: Secondary | ICD-10-CM | POA: Diagnosis not present

## 2024-07-17 LAB — CULTURE, BLOOD (ROUTINE X 2)
Culture: NO GROWTH
Culture: NO GROWTH
Special Requests: ADEQUATE

## 2024-07-17 MED ORDER — OLANZAPINE 5 MG PO TBDP: 5.0000 mg | ORAL_TABLET | Freq: Every day | ORAL | Status: DC

## 2024-07-17 MED ORDER — STERILE WATER FOR INJECTION IJ SOLN
INTRAMUSCULAR | Status: AC
  Filled 2024-07-17: qty 10

## 2024-07-17 MED ORDER — OLANZAPINE 10 MG IM SOLR
5.0000 mg | Freq: Every day | INTRAMUSCULAR | Status: DC
  Administered 2024-07-17: 5 mg via INTRAMUSCULAR
  Filled 2024-07-17 (×2): qty 10

## 2024-07-17 NOTE — Plan of Care (Signed)

## 2024-07-17 NOTE — Progress Notes (Addendum)
 Palliative:  HPI: 83 y.o. female with past medical history of dementia with behavioral disturbance, TIA, essential HTN, HLD, depression, left lumbar radiculopathy s/p decompression, left foot drop admitted on 07/10/2024 with concern for UTI (UA negative) and worsening agitation and dementia. Required Geodon in ED.   I reviewed records. Noted that Ms. Glore has been taking her scheduled Seroquel  the last couple days but had significant agitation overnight requiring Geodon x 2. I met today at Ms. Sharps's bedside today and she is awake and alert. She does respond verbally although she is fixated with moving her sheets around. She has pulled her gown off. She denies any complaints. She denies any needs. She is able to name her 2 daughters - [name] is my eldest and my other daughter is Comptroller. She is not currently combative and is pleasantly confused.   Discussed with Dr. Cindy. I worry that Seroquel  is not effective for her given worsening overnight. We reviewed consideration of d/c Seroquel  and replacing with Zyprexa 5 mg at bedtime (po or IM if refusing po). Monitor for tolerance overnight and if ongoing agitation can titrate nightly dose as well as provide daily dose. Dr. Cindy agreed with changes. Will continue to follow and adjust. I attempted to call and update [name] but unable to reach her - HIPAA compliant voicemail left with general update.   Reviewed with my colleagues for symptom management suggestions.   Exam: Awake, alert, tracking. Answers questions appropriately. Pleasantly confused. Able to tell me her daughters names. Agitated/restless but not combative. Breathing regular, unlabored. Abd flat.   Plan: - DNR in place - Avoid escalation of care to ICU or invasive measures - D/C Seroquel  - Adding Zyprexa 5 mg at bedtime (po or IM) - Follow up tomorrow for further adjustments/recommendations  50 min  [name], NP Palliative Medicine Team Pager [phone] (Please see  [email or web address] for schedule) Team Phone [phone]

## 2024-07-17 NOTE — Progress Notes (Signed)
  Progress Note   Patient: [name] FMW:[id number] DOB: March 19, 1941 DOA: 07/10/2024     5 DOS: the patient was seen and examined on 07/17/2024   Brief hospital course: 83/F w h/o progressive dementia with behavioral disturbance, TIA, HTN, HLD, depression, left lumbar radiculopathy status post decompression, chronic gait abnormality presented to the ED due to worsening confusion more than baseline with aggression/increased irritability for about 1 week. History of frequent falls from unsteady gait, recently about 4 days PTA. Patient follows neurology with [facility] and behavioral health, plans for patient to be referred to geriatric psychiatry was initiated. In the ED, patient was hemodynamically stable, labs, UA, unremarkable. CT head/cervical spine with no acute intracranial abnormality, CT abdomen/pelvis with no acute findings, except for 12 mm nonobstructive renal calculus on the left, no hydronephrosis, large hiatal hernia. Daughter was concerned about UTI and also reported being unable to care for patient at home. Psychiatry consulted> increased Seroquel , added Haldol  as needed   Assessment and Plan: Dementia with worsening behavioral disturbance Increased agitation Per chart review patient has been working with outpatient behavioral health, neurology and referred to geriatric neuropsychiatry  Inpatient psychiatric consulted, recommend adding Seroquel  75 mg at bedtime, continuing Zoloft  200 mg daily, continue trazodone 100 mg at bedtime as needed, Geodon 20 mg IM every 6 hours as needed for severe agitation as well as Haldol .  Continue fall precaution Continue PT OT Continue TOC and SNF bed search Palliative care following, cont on IV haldol  scheduled if pt cont to refuse pills. Intermittently has been taking PO meds recently Unclear why geodon was discontinued. Pt was very agitated 10/19, resumed IM geodon Appreciate Palliative Care assistance   History of left-sided foot  drop History of unsteady gait History of frequent fall CT head, CT cervical spine, CT abdomen pelvis unremarkable Reports some chronic back pain Scheduled Tylenol  x 2 days Continue fall precaution PT/OT   Essential hypertension Continue Imdur    Hyperlipidemia Continue Zocor    History of TIA Continue aspirin  and Zocor       Subjective: Pleasantly confused this afternoon  Physical Exam: Vitals:   07/16/24 0754 07/16/24 1208 07/16/24 1958 07/17/24 0831  BP: (!) 191/81 (!) 149/97 (!) 142/76 (!) 183/124  Pulse: 73 91 76 (!) 108  Resp: 19 19 18 20   TempSrc:      SpO2: 99% 95% 97% (!) 74%  Weight:      Height:       General exam: Conversant, in no acute distress Respiratory system: normal chest rise, clear, no audible wheezing Cardiovascular system: regular rhythm, s1-s2 Gastrointestinal system: Nondistended, nontender, pos BS Central nervous system: No seizures, no tremors Extremities: No cyanosis, no joint deformities Skin: No rashes, no pallor Psychiatry: difficult to assess given mentation  Data Reviewed:  There are no new results to review at this time.  Family Communication: Pt in room, family not at bedside  Disposition: Status is: Inpatient Remains inpatient appropriate because: severity of illness  Planned Discharge Destination: Skilled nursing facility    Author: Garnette Pelt, MD 07/17/2024 3:53 PM  For on call review [email or web address].

## 2024-07-17 NOTE — Progress Notes (Signed)
 Physical Therapy Treatment Patient Details Name: [name] MRN: [id number] DOB: 10-Aug-1941 Today's Date: 07/17/2024   History of Present Illness Pt is an 83 y.o. female presented 07/10/24 due to UTI symptoms and unsteadiness with repeated falls over the last few days. UA -PMH significant for CAD, HTN, dementia with behavioral disturbances, frequent UTIs, TIA, L  lumbar radiculopathy status post decompression of the left foot drop    PT Comments  Pt in bed with Posey belt on, with legs out between foot board and bedrail, blinds shut and sitter not in room. PT opened blinds for increased daytime stimulation. PT put down bedrail and pt came to sitting EoB. Skin is cold to touch. Pt follows single step commands inconsistently, with PT assisted pt in situating herself back in the bed, requiring min A for returning LE to bed.  Pt provided with warm blankets and socks. Pt appreciative. Laying back in bed. D/c plans remain appropriate at this time. PT will continue to follow acutely    If plan is discharge home, recommend the following: A little help with walking and/or transfers;A little help with bathing/dressing/bathroom;Assistance with cooking/housework;Direct supervision/assist for medications management;Direct supervision/assist for financial management;Assist for transportation;Help with stairs or ramp for entrance;Supervision due to cognitive status   Can travel by private vehicle     No  Equipment Recommendations  None recommended by PT       Precautions / Restrictions Precautions Precautions: Fall Recall of Precautions/Restrictions: Impaired Precaution/Restrictions Comments: frequent falls in days before hospitalization Restrictions Weight Bearing Restrictions Per Provider Order: No     Mobility  Bed Mobility Overal bed mobility: Needs Assistance Bed Mobility: Supine to Sit, Sit to Supine     Supine to sit: HOB elevated, Used rails, Supervision Sit to supine: Min assist, HOB  elevated, Used rails   General bed mobility comments: pt with Posey belt on and LE hanging out of bed between foot board and bed rail, with bed rail removed pt sits EOB. Pt assists with rearranging Posey belt and getting her settled back in the bed                    Balance Overall balance assessment: Needs assistance Sitting-balance support: Feet supported, Bilateral upper extremity supported, Single extremity supported Sitting balance-Leahy Scale: Fair Sitting balance - Comments: lateral lean when looking for blankets in bed                                    Communication Communication Communication: No apparent difficulties  Cognition Arousal: Lethargic Behavior During Therapy: Flat affect   PT - Cognitive impairments: History of cognitive impairments                       PT - Cognition Comments: oriented to self, answers questuions intermittently Following commands: Impaired Following commands impaired: Follows one step commands inconsistently    Cueing Cueing Techniques: Verbal cues  Exercises  Minimal LE ROM exercises    General Comments General comments (skin integrity, edema, etc.): sitter not in room, Posey belt on, pt skin and feet cold to the touch. pt appreciative of warm blankets and socks,      Pertinent Vitals/Pain Pain Assessment Pain Assessment: Faces Faces Pain Scale: Hurts a little bit Pain Location: L ribs, at site of Posey belt Pain Descriptors / Indicators: Sore Pain Intervention(s): Limited activity within patient's tolerance, Monitored during session,  Repositioned     PT Goals (current goals can now be found in the care plan section) Acute Rehab PT Goals PT Goal Formulation: Patient unable to participate in goal setting Time For Goal Achievement: 07/25/24 Potential to Achieve Goals: Fair Progress towards PT goals: Not progressing toward goals - comment    Frequency    Min 2X/week       AM-PAC PT 6  Clicks Mobility   Outcome Measure  Help needed turning from your back to your side while in a flat bed without using bedrails?: None Help needed moving from lying on your back to sitting on the side of a flat bed without using bedrails?: None Help needed moving to and from a bed to a chair (including a wheelchair)?: A Lot Help needed standing up from a chair using your arms (e.g., wheelchair or bedside chair)?: A Lot Help needed to walk in hospital room?: A Lot Help needed climbing 3-5 steps with a railing? : A Lot 6 Click Score: 16    End of Session   Activity Tolerance: Patient tolerated treatment well Patient left: with nursing/sitter in room;in bed;with bed alarm set;with restraints reapplied Nurse Communication: Mobility status PT Visit Diagnosis: Unsteadiness on feet (R26.81);Other abnormalities of gait and mobility (R26.89);Muscle weakness (generalized) (M62.81);Difficulty in walking, not elsewhere classified (R26.2);Adult, failure to thrive (R62.7);Other symptoms and signs involving the nervous system (R29.898);Repeated falls (R29.6)     Time: 9093-9075 PT Time Calculation (min) (ACUTE ONLY): 18 min  Charges:    $Therapeutic Activity: 8-22 mins PT General Charges $$ ACUTE PT VISIT: 1 Visit                     [name] B. [name] PT, DPT Acute Rehabilitation Services Please use secure chat or  Call Office [phone]    [name] 07/17/2024, 9:34 AM

## 2024-07-18 DIAGNOSIS — R451 Restlessness and agitation: Secondary | ICD-10-CM | POA: Diagnosis not present

## 2024-07-18 DIAGNOSIS — F03918 Unspecified dementia, unspecified severity, with other behavioral disturbance: Secondary | ICD-10-CM | POA: Diagnosis not present

## 2024-07-18 DIAGNOSIS — Z7189 Other specified counseling: Secondary | ICD-10-CM | POA: Diagnosis not present

## 2024-07-18 DIAGNOSIS — Z515 Encounter for palliative care: Secondary | ICD-10-CM | POA: Diagnosis not present

## 2024-07-18 MED ORDER — OLANZAPINE 10 MG IM SOLR
10.0000 mg | Freq: Every day | INTRAMUSCULAR | Status: DC
Start: 2024-07-18 — End: 2024-08-03
  Administered 2024-08-02: 10 mg via INTRAMUSCULAR
  Filled 2024-07-18 (×16): qty 10

## 2024-07-18 MED ORDER — STERILE WATER FOR INJECTION IJ SOLN
INTRAMUSCULAR | Status: AC
  Filled 2024-07-18: qty 10

## 2024-07-18 MED ORDER — OLANZAPINE 5 MG PO TBDP
10.0000 mg | ORAL_TABLET | Freq: Every day | ORAL | Status: DC
  Administered 2024-07-18 – 2024-08-01 (×15): 10 mg via ORAL
  Filled 2024-07-18 (×17): qty 2

## 2024-07-18 MED ORDER — OLANZAPINE 2.5 MG PO TABS
2.5000 mg | ORAL_TABLET | Freq: Every day | ORAL | Status: DC
  Administered 2024-07-18 – 2024-07-19 (×2): 2.5 mg via ORAL
  Filled 2024-07-18 (×2): qty 1

## 2024-07-18 NOTE — Care Management Important Message (Signed)
 Important Message  Patient Details  Name: [name] MRN: [id number] Date of Birth: 1941-03-13   Important Message Given:  Yes - Medicare IM     Claretta Deed 07/18/2024, 3:09 PM

## 2024-07-18 NOTE — Plan of Care (Signed)

## 2024-07-18 NOTE — Plan of Care (Signed)
  Problem: Clinical Measurements: Goal: Ability to maintain clinical measurements within normal limits will improve Outcome: Progressing Goal: Will remain free from infection Outcome: Progressing Goal: Diagnostic test results will improve Outcome: Progressing Goal: Respiratory complications will improve Outcome: Progressing Goal: Cardiovascular complication will be avoided Outcome: Progressing   Problem: Activity: Goal: Risk for activity intolerance will decrease Outcome: Progressing   Problem: Nutrition: Goal: Adequate nutrition will be maintained Outcome: Progressing   Problem: Elimination: Goal: Will not experience complications related to bowel motility Outcome: Progressing Goal: Will not experience complications related to urinary retention Outcome: Progressing   Problem: Safety: Goal: Ability to remain free from injury will improve Outcome: Progressing   Problem: Skin Integrity: Goal: Risk for impaired skin integrity will decrease Outcome: Progressing   Problem: Education: Goal: Knowledge of General Education information will improve Description: Including pain rating scale, medication(s)/side effects and non-pharmacologic comfort measures Outcome: Not Progressing   Problem: Health Behavior/Discharge Planning: Goal: Ability to manage health-related needs will improve Outcome: Not Progressing   Problem: Coping: Goal: Level of anxiety will decrease Outcome: Not Progressing   Problem: Pain Managment: Goal: General experience of comfort will improve and/or be controlled Outcome: Not Progressing

## 2024-07-18 NOTE — Progress Notes (Signed)
 Occupational Therapy Treatment Patient Details Name: [name] MRN: [id number] DOB: [birth year] Today's Date: 07/18/2024   History of present illness Pt is an 83 y.o. female presented 07/10/24 due to UTI symptoms and unsteadiness with repeated falls over the last few days. UA -PMH significant for CAD, HTN, dementia with behavioral disturbances, frequent UTIs, TIA, L  lumbar radiculopathy status post decompression of the left foot drop   OT comments  Patient demonstrating good gains with OT treatment. Patient able to doff socks seated on EOB and mod assist to donn. Patient able to ambulate to bathroom with CGA HHA and stood for toilet hygiene. Patient able to stand at sink for grooming tasks without a seated rest break before ambulating to recliner.  Patient will benefit from continued inpatient follow up therapy, <3 hours/day.  Acute OT to continue to follow to address established goals to facilitate DC to next venue of care.        If plan is discharge home, recommend the following:  A little help with walking and/or transfers;A little help with bathing/dressing/bathroom;Assistance with cooking/housework;Assistance with feeding;Direct supervision/assist for medications management;Direct supervision/assist for financial management;Assist for transportation;Help with stairs or ramp for entrance;Supervision due to cognitive status   Equipment Recommendations  Wheelchair (measurements OT)    Recommendations for Other Services      Precautions / Restrictions Precautions Precautions: Fall Recall of Precautions/Restrictions: Impaired Precaution/Restrictions Comments: frequent falls in days before hospitalization Restrictions Weight Bearing Restrictions Per Provider Order: No       Mobility Bed Mobility Overal bed mobility: Needs Assistance Bed Mobility: Supine to Sit     Supine to sit: HOB elevated, Used rails, Supervision     General bed mobility comments: increased time and  supervision    Transfers Overall transfer level: Needs assistance Equipment used: 1 person hand held assist Transfers: Sit to/from Stand, Bed to chair/wheelchair/BSC Sit to Stand: Contact guard assist           General transfer comment: CGA to stand from EOB and toilet, CGA to min assist for mobility in room     Balance Overall balance assessment: Needs assistance Sitting-balance support: Feet supported, Bilateral upper extremity supported, Single extremity supported Sitting balance-Leahy Scale: Fair Sitting balance - Comments: EOB   Standing balance support: Single extremity supported, During functional activity Standing balance-Leahy Scale: Fair Standing balance comment: HHA for mobility and used grab bar for support in bathroom and sink for grooming tasks                           ADL either performed or assessed with clinical judgement   ADL Overall ADL's : Needs assistance/impaired Eating/Feeding: Set up;Sitting   Grooming: Wash/dry hands;Wash/dry face;Oral care;Brushing hair;Contact guard assist;Cueing for sequencing;Standing Grooming Details (indicate cue type and reason): at sink             Lower Body Dressing: Moderate assistance;Cueing for sequencing;Sitting/lateral leans Lower Body Dressing Details (indicate cue type and reason): patient able to doff socks and mod assist to [employer] Transfer: Contact guard assist;Cueing for safety;Cueing for sequencing;Regular Toilet;Grab bars Toilet Transfer Details (indicate cue type and reason): HHA to ambulate to bathroom Toileting- Clothing Manipulation and Hygiene: Contact guard assist;Sit to/from stand       Functional mobility during ADLs: Contact guard assist;Cueing for sequencing;Cueing for safety General ADL Comments: HHA for mobility and balance    Extremity/Trunk Assessment  Vision       Perception     Praxis     Communication Communication Communication: Other  (comment) Factors Affecting Communication: Reduced clarity of speech   Cognition Arousal: Alert Behavior During Therapy: Flat affect Cognition: History of cognitive impairments             OT - Cognition Comments: pleasant this am, often incoherent speech                 Following commands: Impaired Following commands impaired: Follows one step commands inconsistently      Cueing   Cueing Techniques: Verbal cues  Exercises      Shoulder Instructions       General Comments Patient pleasant this am with no episodes of behaviors    Pertinent Vitals/ Pain       Pain Assessment Pain Assessment: Faces Faces Pain Scale: No hurt Pain Intervention(s): Monitored during session  Home Living                                          Prior Functioning/Environment              Frequency  Min 2X/week        Progress Toward Goals  OT Goals(current goals can now be found in the care plan section)  Progress towards OT goals: Progressing toward goals  Acute Rehab OT Goals Patient Stated Goal: not able OT Goal Formulation: Patient unable to participate in goal setting Time For Goal Achievement: 07/25/24 Potential to Achieve Goals: Fair ADL Goals Pt Will Perform Upper Body Bathing: with supervision;sitting Pt Will Perform Lower Body Bathing: with contact guard assist;sit to/from stand Pt Will Perform Upper Body Dressing: with supervision;sitting Pt Will Perform Lower Body Dressing: with contact guard assist;sit to/from stand Pt Will Transfer to Toilet: with supervision;ambulating;regular height toilet  Plan      Co-evaluation                 AM-PAC OT 6 Clicks Daily Activity     Outcome Measure   Help from another person eating meals?: A Little Help from another person taking care of personal grooming?: A Little Help from another person toileting, which includes using toliet, bedpan, or urinal?: A Little Help from another  person bathing (including washing, rinsing, drying)?: A Little Help from another person to put on and taking off regular upper body clothing?: A Little Help from another person to put on and taking off regular lower body clothing?: A Lot 6 Click Score: 17    End of Session Equipment Utilized During Treatment: Gait belt  OT Visit Diagnosis: Unsteadiness on feet (R26.81);Other abnormalities of gait and mobility (R26.89);Repeated falls (R29.6);Muscle weakness (generalized) (M62.81)   Activity Tolerance Patient tolerated treatment well   Patient Left in chair;with call bell/phone within reach;with nursing/sitter in room   Nurse Communication Mobility status        Time: 9155-9093 OT Time Calculation (min): 22 min  Charges: OT General Charges $OT Visit: 1 Visit OT Treatments $Self Care/Home Management : 8-22 mins  Dick Laine, OTA Acute Rehabilitation Services  Office [phone]   Jeb LITTIE Laine 07/18/2024, 1:32 PM

## 2024-07-18 NOTE — Progress Notes (Signed)
  Progress Note   Patient: [name] FMW:[id number] DOB: 1940-12-01 DOA: 07/10/2024     6 DOS: the patient was seen and examined on 07/18/2024   Brief hospital course: 83/F w h/o progressive dementia with behavioral disturbance, TIA, HTN, HLD, depression, left lumbar radiculopathy status post decompression, chronic gait abnormality presented to the ED due to worsening confusion more than baseline with aggression/increased irritability for about 1 week. History of frequent falls from unsteady gait, recently about 4 days PTA. Patient follows neurology with [facility] and behavioral health, plans for patient to be referred to geriatric psychiatry was initiated. In the ED, patient was hemodynamically stable, labs, UA, unremarkable. CT head/cervical spine with no acute intracranial abnormality, CT abdomen/pelvis with no acute findings, except for 12 mm nonobstructive renal calculus on the left, no hydronephrosis, large hiatal hernia. Daughter was concerned about UTI and also reported being unable to care for patient at home. Psychiatry consulted> increased Seroquel , added Haldol  as needed   Assessment and Plan: Dementia with worsening behavioral disturbance Increased agitation Per chart review patient has been working with outpatient behavioral health, neurology and referred to geriatric neuropsychiatry  Inpatient psychiatric previously consulted, recommended adding Seroquel  75 mg at bedtime, continuing Zoloft  200 mg daily, continue trazodone 100 mg at bedtime as needed, Geodon 20 mg IM every 6 hours as needed for severe agitation as well as Haldol .  Still restless and confused. Palliative Care following. Plan for zyprexa 2.5mg  added to AM dose with at bedtime dose increased to 10mg    History of left-sided foot drop History of unsteady gait History of frequent fall CT head, CT cervical spine, CT abdomen pelvis unremarkable Reports some chronic back pain Scheduled Tylenol  x 2 days Continue fall  precaution PT/OT   Essential hypertension Continue Imdur    Hyperlipidemia Continue Zocor    History of TIA Continue aspirin  and Zocor       Subjective: Pleasantly confused this AM  Physical Exam: Vitals:   07/17/24 2008 07/17/24 2328 07/18/24 0403 07/18/24 1144  BP: (!) 181/97 (!) 179/99 (!) 152/84 (!) 161/94  Pulse: 92 78 73 87  Resp: 16 14 14    Temp: 97.9 F (36.6 C) (!) 97.4 F (36.3 C) (!) 97.3 F (36.3 C) (!) 97.5 F (36.4 C)  TempSrc:  Oral Oral Oral  SpO2: 100% 97% 99% 99%  Weight:      Height:       General exam: Awake, laying in bed, in nad Respiratory system: Normal respiratory effort, no wheezing Cardiovascular system: regular rate, s1, s2 Gastrointestinal system: Soft, nondistended, positive BS Central nervous system: CN2-12 grossly intact, strength intact Extremities: Perfused, no clubbing Skin: Normal skin turgor, no notable skin lesions seen Psychiatry: Mood normal // no visual hallucinations   Data Reviewed:  There are no new results to review at this time.  Family Communication: Pt in room, family not at bedside  Disposition: Status is: Inpatient Remains inpatient appropriate because: severity of illness  Planned Discharge Destination: Skilled nursing facility    Author: Garnette Pelt, MD 07/18/2024 3:40 PM  For on call review [email or web address].

## 2024-07-18 NOTE — Progress Notes (Signed)
 Palliative:  HPI: 83 y.o. female with past medical history of dementia with behavioral disturbance, TIA, essential HTN, HLD, depression, left lumbar radiculopathy s/p decompression, left foot drop admitted on 07/10/2024 with concern for UTI (UA negative) and worsening agitation and dementia. Required Geodon in ED.  Ongoing fluctuation of symptoms that are very unpredictable and difficult to manage.   I met today at Ms. Weppler's bedside along with NT at bedside. Ms. Regan is sitting up in recliner sipping her coffee. Baseline confusion. NT at bedside reports good morning so far. She worked well with OT. She ate a banana for breakfast. She has been pleasantly confused and cooperative with staff. Ms. Trueheart answers my questions appropriately and is friendly in interaction.   I spoke with daughter, [name], via phone who reports she had a good visit with her mother yesterday. She will be coming to visit her shortly. I shared with her ongoing changes in medications with attempts to better manage severe agitation episodes. These seem to happen more at nighttime. [name] agrees with medication adjustments with a goal to better manage symptoms. We did discuss her hopes for return home vs facility placement depending on symptoms. [name] would like to consider return back home if a medication regimen were found to better manage symptoms but she cannot care for her at home if she is becoming violent.   Update: Noted PRN geodon given ~1pm. I came to bedside and discussed with RN who reports that she became combative to staff. They attempted to assist her to walk around and gave PRN haldol  but nothing seemed to provide relief. She did calm down after geodon but is still restless and more confused lying in bed currently. Will increase nighttime Zyprexa and reassess in the am. I did add small dose am Zyprexa which did not seem to help - will consider increasing am dose in the morning after assessment overnight.   Exam  on initial visit: Awake, pleasantly confused. Thin, frail. Breathing regular, unlabored. Abd flat. Happily sipping coffee in recliner. Pleasant and friendly interaction and conversation.   Exam later in the day: Lying in bed. More confused. Difficult to focus. Very restless but not combative.  Plan: - DNR in place - Avoid escalation of care to ICU or invasive measures - Zyprexa 2.5 mg added for am dose. Nighttime dose increased to 10 mg at bedtime (po or IM) - Follow up tomorrow for further adjustments/recommendations  55 min  [name], NP Palliative Medicine Team Pager [phone] (Please see [email or web address] for schedule) Team Phone [phone]

## 2024-07-19 DIAGNOSIS — R451 Restlessness and agitation: Secondary | ICD-10-CM | POA: Diagnosis not present

## 2024-07-19 DIAGNOSIS — F03918 Unspecified dementia, unspecified severity, with other behavioral disturbance: Secondary | ICD-10-CM | POA: Diagnosis not present

## 2024-07-19 DIAGNOSIS — Z515 Encounter for palliative care: Secondary | ICD-10-CM | POA: Diagnosis not present

## 2024-07-19 MED ORDER — OLANZAPINE 5 MG PO TABS
5.0000 mg | ORAL_TABLET | Freq: Every day | ORAL | Status: DC
  Administered 2024-07-20 – 2024-07-23 (×4): 5 mg via ORAL
  Filled 2024-07-19 (×4): qty 1

## 2024-07-19 MED ORDER — OLANZAPINE 2.5 MG PO TABS
2.5000 mg | ORAL_TABLET | Freq: Once | ORAL | Status: AC
  Administered 2024-07-19: 2.5 mg via ORAL
  Filled 2024-07-19: qty 1

## 2024-07-19 NOTE — Plan of Care (Signed)
  Problem: Clinical Measurements: Goal: Ability to maintain clinical measurements within normal limits will improve Outcome: Progressing Goal: Will remain free from infection Outcome: Progressing Goal: Diagnostic test results will improve Outcome: Progressing Goal: Respiratory complications will improve Outcome: Progressing Goal: Cardiovascular complication will be avoided Outcome: Progressing   Problem: Activity: Goal: Risk for activity intolerance will decrease Outcome: Progressing   Problem: Safety: Goal: Ability to remain free from injury will improve Outcome: Progressing   Problem: Skin Integrity: Goal: Risk for impaired skin integrity will decrease Outcome: Progressing   Problem: Education: Goal: Knowledge of General Education information will improve Description: Including pain rating scale, medication(s)/side effects and non-pharmacologic comfort measures Outcome: Not Progressing   Problem: Health Behavior/Discharge Planning: Goal: Ability to manage health-related needs will improve Outcome: Not Progressing   Problem: Nutrition: Goal: Adequate nutrition will be maintained Outcome: Not Progressing   Problem: Coping: Goal: Level of anxiety will decrease Outcome: Not Progressing   Problem: Elimination: Goal: Will not experience complications related to bowel motility Outcome: Not Progressing Goal: Will not experience complications related to urinary retention Outcome: Not Progressing   Problem: Pain Managment: Goal: General experience of comfort will improve and/or be controlled Outcome: Not Progressing

## 2024-07-19 NOTE — Progress Notes (Signed)
 Physical Therapy Treatment Patient Details Name: [name] MRN: [id number] DOB: 1941/01/11 Today's Date: 07/19/2024   History of Present Illness Pt is an 83 y.o. female presented 07/10/24 due to UTI symptoms and unsteadiness with repeated falls over the last few days. UA -PMH significant for CAD, HTN, dementia with behavioral disturbances, frequent UTIs, TIA, L  lumbar radiculopathy status post decompression of the left foot drop    PT Comments  RN reports pt was aggressive last night and now is tired. PT opened blinds and pt wakes up easily, agreeable to walking. Pt continues to be limited in safe mobility by decreased balance and strength. Pt is contact guard-minA for bed mobility. Once in upright pt is 1 person HHA with min A for steadying with standing and walking in the hallway. Pt pleasantly chatters as she walks and stops to talk to staff in the hallway. NP in room at end of session. PT alerts RN that NP is in room now. D/c plans are appropriate. PT will continue to follow acutely.     If plan is discharge home, recommend the following: A little help with walking and/or transfers;A little help with bathing/dressing/bathroom;Assistance with cooking/housework;Direct supervision/assist for medications management;Direct supervision/assist for financial management;Assist for transportation;Help with stairs or ramp for entrance;Supervision due to cognitive status   Can travel by private vehicle     No  Equipment Recommendations  None recommended by PT       Precautions / Restrictions Precautions Precautions: Fall Recall of Precautions/Restrictions: Impaired Precaution/Restrictions Comments: frequent falls in days before hospitalization Restrictions Weight Bearing Restrictions Per Provider Order: No     Mobility  Bed Mobility Overal bed mobility: Needs Assistance Bed Mobility: Supine to Sit, Sit to Supine     Supine to sit: HOB elevated, Used rails, Supervision Sit to supine:  Min assist, HOB elevated, Used rails   General bed mobility comments: able to come to EoB easily, pt able to lift LE to bed but needs assist to bring them into bed far enough to put bed rail up    Transfers Overall transfer level: Needs assistance Equipment used: 1 person hand held assist Transfers: Sit to/from Stand Sit to Stand: Min assist           General transfer comment: 1 person HHA to come to standing    Ambulation/Gait Ambulation/Gait assistance: Min assist Gait Distance (Feet): 100 Feet Assistive device: 1 person hand held assist Gait Pattern/deviations: Step-through pattern, Decreased step length - right, Decreased step length - left, Narrow base of support, Shuffle Gait velocity: slowed Gait velocity interpretation: <1.31 ft/sec, indicative of household ambulator   General Gait Details: min A for steadying with arm behind back and HHA of R hand to keep her from reaching to inappropriate furniture (with wheels) for support         Balance Overall balance assessment: Needs assistance Sitting-balance support: Feet supported, Bilateral upper extremity supported, Single extremity supported Sitting balance-Leahy Scale: Fair Sitting balance - Comments: lateral lean when looking for blankets in bed   Standing balance support: Single extremity supported, During functional activity Standing balance-Leahy Scale: Fair Standing balance comment: HHA for mobility                            Communication Communication Communication: No apparent difficulties  Cognition Arousal: Alert Behavior During Therapy: Flat affect   PT - Cognitive impairments: History of cognitive impairments  PT - Cognition Comments: oriented to self, answers questuions intermittently, constant conversation about where she used to work Following commands: Impaired Following commands impaired: Follows one step commands inconsistently    Cueing Cueing  Techniques: Verbal cues     General Comments General comments (skin integrity, edema, etc.): RN reports pt was very aggitated overnight but that she is very plesant now, raised blinds all the way up and turned up TV to provide increased stimulation throughout daytime hours      Pertinent Vitals/Pain Pain Assessment Pain Assessment: Faces Faces Pain Scale: No hurt     PT Goals (current goals can now be found in the care plan section) Acute Rehab PT Goals PT Goal Formulation: Patient unable to participate in goal setting Time For Goal Achievement: 07/25/24 Potential to Achieve Goals: Fair Progress towards PT goals: Progressing toward goals    Frequency    Min 2X/week       AM-PAC PT 6 Clicks Mobility   Outcome Measure  Help needed turning from your back to your side while in a flat bed without using bedrails?: None Help needed moving from lying on your back to sitting on the side of a flat bed without using bedrails?: None Help needed moving to and from a bed to a chair (including a wheelchair)?: A Lot Help needed standing up from a chair using your arms (e.g., wheelchair or bedside chair)?: A Lot Help needed to walk in hospital room?: A Lot Help needed climbing 3-5 steps with a railing? : A Lot 6 Click Score: 16    End of Session   Activity Tolerance: Patient tolerated treatment well Patient left: in bed;with bed alarm set;Other (comment) (NP in room) Nurse Communication: Mobility status;Other (comment) (that NP was in the room) PT Visit Diagnosis: Unsteadiness on feet (R26.81);Other abnormalities of gait and mobility (R26.89);Muscle weakness (generalized) (M62.81);Difficulty in walking, not elsewhere classified (R26.2);Adult, failure to thrive (R62.7);Other symptoms and signs involving the nervous system (R29.898);Repeated falls (R29.6)     Time: 9043-8988 PT Time Calculation (min) (ACUTE ONLY): 15 min  Charges:    $Gait Training: 8-22 mins PT General  Charges $$ ACUTE PT VISIT: 1 Visit                     [name] PT, DPT Acute Rehabilitation Services Please use secure chat or  Call Office [phone]    [name] 07/19/2024, 10:38 AM

## 2024-07-19 NOTE — Progress Notes (Signed)
 PROGRESS NOTE    [name]  FMW:[id number] DOB: 1940/11/19 DOA: 07/10/2024 PCP: Debrah Josette ORN., PA-C   Brief Narrative:  This 83 yrs old Female with h/o progressive dementia with behavioral disturbance, TIA, HTN, HLD, depression, left lumbar radiculopathy status post decompression, chronic gait abnormality presented to the ED due to worsening confusion more than baseline with aggression / increased irritability for about 1 week. History of frequent falls from unsteady gait, recently about 4 days PTA. Patient follows with Neurology at [facility] and behavioral health, plans for patient to be referred to geriatric psychiatry was initiated. In the ED, Patient was hemodynamically stable, labs, UA, unremarkable. CT head /cervical spine with no acute intracranial abnormality, CT abdomen/pelvis with no acute findings, except for 12 mm nonobstructive renal calculus on the left, No hydronephrosis, large hiatal hernia. Daughter was concerned about UTI and also reported being unable to care for patient at home. Psychiatry consulted > increased Seroquel , added Haldol  as needed.  Assessment & Plan:   Principal Problem:   Dementia with behavioral disturbance (HCC) Active Problems:   Altered mental status   Hyperlipidemia   Left foot drop   Gait abnormality   History of dementia   History of TIA (transient ischemic attack)  Dementia with worsening behavioral disturbance: Increased agitation: Patient has been working with outpatient behavioral health, Neurology and was referred to geriatric neuropsychiatry  Inpatient psychiatric previously consulted, recommended adding Seroquel  75 mg at bedtime, continuing Zoloft  200 mg daily, continue trazodone 100 mg at bedtime as needed, Geodon 20 mg IM every 6 hours as needed for severe agitation as well as Haldol .  Still restless and confused. Palliative Care following.  Plan for zyprexa 2.5mg  added to AM dose with bedtime dose increased to 10mg .    History of left-sided foot drop: History of unsteady gait: History of frequent falls: CT head, CT cervical spine, CT abdomen pelvis unremarkable. Reports some chronic back pain. Scheduled Tylenol  x 2 days. Continue fall precaution PT/OT Evaluation.   Essential hypertension Continue Imdur .   Hyperlipidemia Continue Zocor .   History of TIA Continue aspirin  and Zocor .    DVT prophylaxis: Lovenox  Code Status: DNR Family Communication: Daughter at bedside Disposition Plan:   Status is: Inpatient Remains inpatient appropriate because: Severity of illness.   Consultants:  Psychiatry  Procedures: None Antimicrobials: None  Subjective: Patient was seen and examined at bedside.  Overnight events noted. Patient seems improved.  Denies any concerns.  Patient has difficult to manage agitation.  She does have intermittent agitation spells.  Objective: Vitals:   07/17/24 2328 07/18/24 0403 07/18/24 1144 07/19/24 0758  BP: (!) 179/99 (!) 152/84 (!) 161/94 (!) 152/89  Pulse: 78 73 87 81  Resp: 14 14  20   Temp: (!) 97.4 F (36.3 C) (!) 97.3 F (36.3 C) (!) 97.5 F (36.4 C)   TempSrc: Oral Oral Oral   SpO2: 97% 99% 99% 97%  Weight:      Height:        Intake/Output Summary (Last 24 hours) at 07/19/2024 1545 Last data filed at 07/19/2024 0900 Gross per 24 hour  Intake 118 ml  Output --  Net 118 ml   Filed Weights   07/11/24 1152  Weight: 53.5 kg    Examination:  General exam: Appears calm and comfortable, not in any acute distress. Respiratory system: Clear to auscultation. Respiratory effort normal.  RR 12 Cardiovascular system: S1 & S2 heard, RRR. No JVD, murmurs, rubs, gallops or clicks. Gastrointestinal system: Abdomen is non  distended, soft and non tender.  Normal bowel sounds heard. Central nervous system: Alert and oriented x 2. No focal neurological deficits. Extremities: No edema, no cyanosis, no clubbing. Skin: No rashes, lesions or  ulcers Psychiatry: Judgement and insight appear normal. Mood & affect appropriate.   Data Reviewed: I have personally reviewed following labs and imaging studies  CBC: Recent Labs  Lab 07/14/24 0230  WBC 5.8  HGB 11.3*  HCT 34.2*  MCV 98.0  PLT 179   Basic Metabolic Panel: Recent Labs  Lab 07/14/24 0230  NA 140  K 3.8  CL 105  CO2 25  GLUCOSE 85  BUN 23  CREATININE 0.78  CALCIUM  9.4   GFR: Estimated Creatinine Clearance: 45.8 mL/min (by C-G formula based on SCr of 0.78 mg/dL). Liver Function Tests: No results for input(s): AST, ALT, ALKPHOS, BILITOT, PROT, ALBUMIN in the last 168 hours. No results for input(s): LIPASE, AMYLASE in the last 168 hours. No results for input(s): AMMONIA in the last 168 hours. Coagulation Profile: No results for input(s): INR, PROTIME in the last 168 hours. Cardiac Enzymes: No results for input(s): CKTOTAL, CKMB, CKMBINDEX, TROPONINI in the last 168 hours. BNP (last 3 results) No results for input(s): PROBNP in the last 8760 hours. HbA1C: No results for input(s): HGBA1C in the last 72 hours. CBG: No results for input(s): GLUCAP in the last 168 hours. Lipid Profile: No results for input(s): CHOL, HDL, LDLCALC, TRIG, CHOLHDL, LDLDIRECT in the last 72 hours. Thyroid Function Tests: No results for input(s): TSH, T4TOTAL, FREET4, T3FREE, THYROIDAB in the last 72 hours. Anemia Panel: No results for input(s): VITAMINB12, FOLATE, FERRITIN, TIBC, IRON, RETICCTPCT in the last 72 hours. Sepsis Labs: No results for input(s): PROCALCITON, LATICACIDVEN in the last 168 hours.  Recent Results (from the past 240 hours)  Urine Culture     Status: None   Collection Time: 07/10/24  5:28 PM   Specimen: Urine, Clean Catch  Result Value Ref Range Status   Specimen Description URINE, CLEAN CATCH  Final   Special Requests NONE  Final   Culture   Final    NO GROWTH Performed at  [facility], [address], [place], [place] [address]    Report Status 07/12/2024 FINAL  Final  Culture, blood (Routine X 2) w Reflex to ID Panel     Status: None   Collection Time: 07/12/24  6:19 AM   Specimen: BLOOD  Result Value Ref Range Status   Specimen Description BLOOD SITE NOT SPECIFIED  Final   Special Requests   Final    BOTTLES DRAWN AEROBIC AND ANAEROBIC Blood Culture results may not be optimal due to an inadequate volume of blood received in culture bottles   Culture   Final    NO GROWTH 5 DAYS Performed at [facility], [address] [place], [place] [address]    Report Status 07/17/2024 FINAL  Final  Culture, blood (Routine X 2) w Reflex to ID Panel     Status: None   Collection Time: 07/12/24  6:25 AM   Specimen: BLOOD  Result Value Ref Range Status   Specimen Description BLOOD SITE NOT SPECIFIED  Final   Special Requests   Final    BOTTLES DRAWN AEROBIC AND ANAEROBIC Blood Culture adequate volume   Culture   Final    NO GROWTH 5 DAYS Performed at [facility], [address], [place], [place] [address]    Report Status 07/17/2024 FINAL  Final  Radiology Studies: No results found.  Scheduled Meds:  aspirin   81 mg Oral Daily   enoxaparin  (LOVENOX ) injection  40 mg Subcutaneous Q24H   feeding supplement  237 mL Oral BID BM   isosorbide  mononitrate  60 mg Oral Daily   levothyroxine   75 mcg Oral Q0600   OLANZapine zydis  10 mg Oral QHS   Or   OLANZapine  10 mg Intramuscular QHS   [START ON 07/20/2024] OLANZapine  5 mg Oral Daily   sertraline   200 mg Oral Daily   simvastatin   40 mg Oral QHS   sodium chloride  flush  3 mL Intravenous Q12H   sodium chloride  flush  3 mL Intravenous Q12H   Continuous Infusions:   LOS: 7 days    Time spent: 50 mins    Darcel Dawley, MD [facility]   If 7PM-7AM, please contact night-coverage

## 2024-07-19 NOTE — Progress Notes (Signed)
 Palliative:  HPI: 83 y.o. female with past medical history of dementia with behavioral disturbance, TIA, essential HTN, HLD, depression, left lumbar radiculopathy s/p decompression, left foot drop admitted on 07/10/2024 with concern for UTI (UA negative) and worsening agitation and dementia. Required Geodon in ED.  Ongoing fluctuation of symptoms that are very unpredictable and difficult to manage.   I reviewed records, notes, and MAR. I discussed with RN at bedside. Difficult to determine if increased Zyprexa dose last night was beneficial as she received geodon right before Zyprexa. Zyprexa am dose yesterday unable to determine benefits but she did become combative in the afternoon so this dose was increased for today to see if we can maintain improved control of agitation to avoid geodon. Today she is awake and friendly in interaction although still restless and with some pressured speech. Continuing to titrate medications with attempts to better manage and control agitation.   Exam: Alert, confused. Speech somewhat pressured. Restless but not combative. Breathing regular, unlabored. Abd flat, soft. Moves all extremities.   Plan: - DNR/DNI - Avoid escalation of care to ICU or invasive measures - Increasing Zyprexa for agitation: Zyprexa 5 mg in the am and Zyprexa 10 mg at bedtime - Will order 12 lead EKG for follow up on QTc given increase in Zyprexa - QTc reviewed and is stable  35 min  [name], NP Palliative Medicine Team Pager [phone] (Please see [email or web address] for schedule) Team Phone [phone]

## 2024-07-19 NOTE — Plan of Care (Signed)

## 2024-07-20 DIAGNOSIS — F03918 Unspecified dementia, unspecified severity, with other behavioral disturbance: Secondary | ICD-10-CM | POA: Diagnosis not present

## 2024-07-20 DIAGNOSIS — Z515 Encounter for palliative care: Secondary | ICD-10-CM | POA: Diagnosis not present

## 2024-07-20 DIAGNOSIS — Z7189 Other specified counseling: Secondary | ICD-10-CM | POA: Diagnosis not present

## 2024-07-20 NOTE — Progress Notes (Signed)
 PROGRESS NOTE    [name]  FMW:[id number] DOB: July 24, 1941 DOA: 07/10/2024 PCP: Debrah Josette ORN., PA-C   Brief Narrative:  This 83 yrs old Female with h/o progressive dementia with behavioral disturbance, TIA, HTN, HLD, depression, left lumbar radiculopathy status post decompression, chronic gait abnormality presented to the ED due to worsening confusion more than baseline with aggression / increased irritability for about 1 week. History of frequent falls from unsteady gait, recently about 4 days PTA. Patient follows with Neurology at [facility] and behavioral health, plans for patient to be referred to geriatric psychiatry was initiated. In the ED, Patient was hemodynamically stable, labs, UA, unremarkable. CT head /cervical spine with no acute intracranial abnormality, CT abdomen/pelvis with no acute findings, except for 12 mm nonobstructive renal calculus on the left, No hydronephrosis, large hiatal hernia. Daughter was concerned about UTI and also reported being unable to care for patient at home. Psychiatry consulted > increased Seroquel , added Haldol  as needed.  Assessment & Plan:   Principal Problem:   Dementia with behavioral disturbance (HCC) Active Problems:   Altered mental status   Hyperlipidemia   Left foot drop   Gait abnormality   History of dementia   History of TIA (transient ischemic attack)  Dementia with worsening behavioral disturbance: Increased agitation: Patient has been working with outpatient behavioral health, Neurology and was referred to geriatric neuropsychiatry. Inpatient psychiatric previously consulted, recommended adding Seroquel  75 mg at bedtime, continuing Zoloft  200 mg daily, continue trazodone 100 mg at bedtime as needed, Geodon 20 mg IM every 6 hours as needed for severe agitation as well as Haldol .  Still restless and confused. Palliative Care following.  Plan for zyprexa 2.5mg  added to AM dose with bedtime dose increased to 10mg .    History of left-sided foot drop: History of unsteady gait: History of frequent falls: CT head, CT cervical spine, CT abdomen pelvis unremarkable. Reports some chronic back pain. Scheduled Tylenol  x 2 days. Continue fall precaution PT/OT Evaluation > SNF   Essential hypertension Continue Imdur .   Hyperlipidemia Continue Zocor .   History of TIA Continue aspirin  and Zocor .  DVT prophylaxis: Lovenox  Code Status: DNR Family Communication: Daughter at bedside Disposition Plan:   Status is: Inpatient Remains inpatient appropriate because: Severity of illness.   Consultants:  Psychiatry  Procedures: None Antimicrobials: None  Subjective: Patient was seen and examined at bedside.  Overnight events noted. Patient seems improved.  Denies any concerns.  Patient has difficult to manage agitation. She does have intermittent agitation spells.  Objective: Vitals:   07/18/24 0403 07/18/24 1144 07/19/24 0758 07/20/24 0933  BP: (!) 152/84 (!) 161/94 (!) 152/89   Pulse: 73 87 81   Resp: 14  20   Temp: (!) 97.3 F (36.3 C) (!) 97.5 F (36.4 C)    TempSrc: Oral Oral    SpO2: 99% 99% 97% 93%  Weight:      Height:        Intake/Output Summary (Last 24 hours) at 07/20/2024 1337 Last data filed at 07/20/2024 1242 Gross per 24 hour  Intake 150 ml  Output --  Net 150 ml   Filed Weights   07/11/24 1152  Weight: 53.5 kg    Examination:  General exam: Appears calm and comfortable, not in any acute distress. Respiratory system: CTA Bilaterally . Respiratory effort normal.  RR 15 Cardiovascular system: S1 & S2 heard, RRR. No JVD, murmurs, rubs, gallops or clicks. Gastrointestinal system: Abdomen is non distended, soft and non tender.  Normal  bowel sounds heard. Central nervous system: Alert and oriented x 2. No focal neurological deficits. Extremities: No edema, no cyanosis, no clubbing. Skin: No rashes, lesions or ulcers Psychiatry: Judgement and insight appear normal. Mood  & affect appropriate.   Data Reviewed: I have personally reviewed following labs and imaging studies  CBC: Recent Labs  Lab 07/14/24 0230  WBC 5.8  HGB 11.3*  HCT 34.2*  MCV 98.0  PLT 179   Basic Metabolic Panel: Recent Labs  Lab 07/14/24 0230  NA 140  K 3.8  CL 105  CO2 25  GLUCOSE 85  BUN 23  CREATININE 0.78  CALCIUM  9.4   GFR: Estimated Creatinine Clearance: 45.8 mL/min (by C-G formula based on SCr of 0.78 mg/dL). Liver Function Tests: No results for input(s): AST, ALT, ALKPHOS, BILITOT, PROT, ALBUMIN in the last 168 hours. No results for input(s): LIPASE, AMYLASE in the last 168 hours. No results for input(s): AMMONIA in the last 168 hours. Coagulation Profile: No results for input(s): INR, PROTIME in the last 168 hours. Cardiac Enzymes: No results for input(s): CKTOTAL, CKMB, CKMBINDEX, TROPONINI in the last 168 hours. BNP (last 3 results) No results for input(s): PROBNP in the last 8760 hours. HbA1C: No results for input(s): HGBA1C in the last 72 hours. CBG: No results for input(s): GLUCAP in the last 168 hours. Lipid Profile: No results for input(s): CHOL, HDL, LDLCALC, TRIG, CHOLHDL, LDLDIRECT in the last 72 hours. Thyroid Function Tests: No results for input(s): TSH, T4TOTAL, FREET4, T3FREE, THYROIDAB in the last 72 hours. Anemia Panel: No results for input(s): VITAMINB12, FOLATE, FERRITIN, TIBC, IRON, RETICCTPCT in the last 72 hours. Sepsis Labs: No results for input(s): PROCALCITON, LATICACIDVEN in the last 168 hours.  Recent Results (from the past 240 hours)  Urine Culture     Status: None   Collection Time: 07/10/24  5:28 PM   Specimen: Urine, Clean Catch  Result Value Ref Range Status   Specimen Description URINE, CLEAN CATCH  Final   Special Requests NONE  Final   Culture   Final    NO GROWTH Performed at [facility], [address] [place], [place] [address]     Report Status 07/12/2024 FINAL  Final  Culture, blood (Routine X 2) w Reflex to ID Panel     Status: None   Collection Time: 07/12/24  6:19 AM   Specimen: BLOOD  Result Value Ref Range Status   Specimen Description BLOOD SITE NOT SPECIFIED  Final   Special Requests   Final    BOTTLES DRAWN AEROBIC AND ANAEROBIC Blood Culture results may not be optimal due to an inadequate volume of blood received in culture bottles   Culture   Final    NO GROWTH 5 DAYS Performed at [facility], [address] [place], [place] [address]    Report Status 07/17/2024 FINAL  Final  Culture, blood (Routine X 2) w Reflex to ID Panel     Status: None   Collection Time: 07/12/24  6:25 AM   Specimen: BLOOD  Result Value Ref Range Status   Specimen Description BLOOD SITE NOT SPECIFIED  Final   Special Requests   Final    BOTTLES DRAWN AEROBIC AND ANAEROBIC Blood Culture adequate volume   Culture   Final    NO GROWTH 5 DAYS Performed at [facility], [address] [place], [place] [address]    Report Status 07/17/2024 FINAL  Final   Radiology Studies: No results found.  Scheduled Meds:  aspirin   81 mg Oral Daily   enoxaparin  (LOVENOX ) injection  40 mg Subcutaneous Q24H   feeding supplement  237 mL Oral BID BM   isosorbide  mononitrate  60 mg Oral Daily   levothyroxine   75 mcg Oral Q0600   OLANZapine zydis  10 mg Oral QHS   Or   OLANZapine  10 mg Intramuscular QHS   OLANZapine  5 mg Oral Daily   sertraline   200 mg Oral Daily   simvastatin   40 mg Oral QHS   sodium chloride  flush  3 mL Intravenous Q12H   sodium chloride  flush  3 mL Intravenous Q12H   Continuous Infusions:   LOS: 8 days    Time spent: 35 mins    Darcel Dawley, MD [facility]   If 7PM-7AM, please contact night-coverage

## 2024-07-20 NOTE — Evaluation (Signed)
 Clinical/Bedside Swallow Evaluation Patient Details  Name: [name] MRN: [id number] Date of Birth: 1941/03/09  Today's Date: 07/20/2024 Time: SLP Start Time (ACUTE ONLY): 1213 SLP Stop Time (ACUTE ONLY): 1233 SLP Time Calculation (min) (ACUTE ONLY): 20 min  Past Medical History:  Past Medical History:  Diagnosis Date   Arthritis    Chest pain    Chronic back pain    neurostimulator electrode leads overlie the lower thoracic spinal canal - per cxr report 07/10/14   Chronic kidney disease    kidney stone   Chronic UTI (urinary tract infection)    Coronary artery disease    NONOBSTRUCTIVE   Dementia (HCC)    Depression    Dizziness    GERD (gastroesophageal reflux disease)    Headache    History of kidney stones    Hyperlipidemia    Hypertension    Left foot drop    SINCE 2009 - RELATED TO BACK PROBLEM - WEARS BRACE   PONV (postoperative nausea and vomiting)    Spinal stenosis    Thyroid disease    hypothryoid   Past Surgical History:  Past Surgical History:  Procedure Laterality Date   BACK SURGERY     CARDIAC CATHETERIZATION  02/22/2009   EF 60%   CATARACT EXTRACTION     both eyes   COLONOSCOPY     CYSTOSCOPY     FOR KIDNEY STONE   LUMBAR LAMINECTOMY/DECOMPRESSION MICRODISCECTOMY     TOTAL HIP ARTHROPLASTY Left 07/17/2014   Procedure: LEFT TOTAL HIP ARTHROPLASTY ANTERIOR APPROACH;  Surgeon: Jocelyn JONETTA Car, MD;  Location: [facility];  Service: Orthopedics;  Laterality: Left;   HPI:  83 yo female presenting to ED 10/13 with worsening confusion and increased aggression/irritability x1 week. UA negative. CTH and CT A/P negative. PMH: dementia with behavioral disturbance, TIA, HTN, HLD, depression, L lumbar radiculopathy s/p decompression, chronic gait abnormality    Assessment / Plan / Recommendation  Clinical Impression  NT sitter reports noticing coughing after drinking with breakfast this AM. Pt is pleasantly confused but able to feed herself given set up  assistance and Min cueing. She is attentive to POs, initiating prompt oral transit and clearing her oral cavity completely. No s/s of aspiration were observed with cup sips of thin liquids, though risk of aspiration certainly exists given baseline dementia. Recommend she continue current diet with full supervision. Ongoing SLP f/u is not necessary at this time, will sign off. SLP Visit Diagnosis: Dysphagia, unspecified (R13.10)    Aspiration Risk  Mild aspiration risk    Diet Recommendation Regular;Thin liquid    Liquid Administration via: Cup;Straw Medication Administration: Whole meds with liquid Supervision: Staff to assist with self feeding;Full supervision/cueing for compensatory strategies Compensations: Slow rate;Small sips/bites Postural Changes: Seated upright at 90 degrees    Other  Recommendations Oral Care Recommendations: Oral care BID     Assistance Recommended at Discharge    Functional Status Assessment Patient has not had a recent decline in their functional status  Frequency and Duration            Prognosis Prognosis for improved oropharyngeal function: Good Barriers to Reach Goals: Cognitive deficits      Swallow Study   General HPI: 83 yo female presenting to ED 10/13 with worsening confusion and increased aggression/irritability x1 week. UA negative. CTH and CT A/P negative. PMH: dementia with behavioral disturbance, TIA, HTN, HLD, depression, L lumbar radiculopathy s/p decompression, chronic gait abnormality Type of Study: Bedside Swallow Evaluation Previous Swallow  Assessment: none in chart Diet Prior to this Study: Regular;Thin liquids (Level 0) Temperature Spikes Noted: No Respiratory Status: Room air History of Recent Intubation: No Behavior/Cognition: Alert;Cooperative;Pleasant mood Oral Cavity Assessment: Within Functional Limits Oral Care Completed by SLP: No Oral Cavity - Dentition: Adequate natural dentition Vision: Functional for  self-feeding Self-Feeding Abilities: Able to feed self;Needs assist Patient Positioning: Upright in bed Baseline Vocal Quality: Normal Volitional Cough: Cognitively unable to elicit Volitional Swallow: Unable to elicit    Oral/Motor/Sensory Function Overall Oral Motor/Sensory Function: Within functional limits   Ice Chips Ice chips: Not tested   Thin Liquid Thin Liquid: Within functional limits Presentation: Cup;Self Fed    Nectar Thick Nectar Thick Liquid: Not tested   Honey Thick Honey Thick Liquid: Not tested   Puree Puree: Within functional limits Presentation: Spoon;Self Fed   Solid     Solid: Within functional limits Presentation: Spoon;Self Fed      [name], M.A., CCC-SLP Speech Language Pathology, Acute Rehabilitation Services  Secure Chat preferred [phone]  07/20/2024,1:01 PM

## 2024-07-20 NOTE — Plan of Care (Signed)

## 2024-07-20 NOTE — Progress Notes (Signed)
 Palliative:  HPI: 83 y.o. female with past medical history of dementia with behavioral disturbance, TIA, essential HTN, HLD, depression, left lumbar radiculopathy s/p decompression, left foot drop admitted on 07/10/2024 with concern for UTI (UA negative) and worsening agitation and dementia. Required Geodon in ED. Ongoing fluctuation of symptoms that are very unpredictable and difficult to manage.   Notes and MAR reviewed. I met again today with [name]. NT/sitter at bedside. Reports good morning. They have walked with minimal assistance. No Geodon needed overnight or since yesterday afternoon. [name] is pleasantly confused. Will continue to monitor response and make adjustments to medications as needed. No changes to regimen today.   Exam: Alert, pleasantly confused. Thin, frail. Sitting on side of bed feeding herself oatmeal. No distress. Breathing regular, unlabored. Abd soft. Moves all extremities.   Plan: - DNR/DNI - Avoid escalation of care to ICU or invasive measures - Continue Zyprexa for agitation: Zyprexa 5 mg in the am and Zyprexa 10 mg at bedtime - Consider search for memory care placement   25 min  [name], NP Palliative Medicine Team Pager [phone] (Please see [email or web address] for schedule) Team Phone [phone]

## 2024-07-20 NOTE — Progress Notes (Addendum)
 Pt very pleasant and cooperative this evening. Unable to obtain full set of vital signs due to pt constant movement during BP reading but no combative or oppositional behavior noted. Pt agreeable to all PM medications. Took whole in ice cream. Pt likes ice cream, fed pt one strawberry & one chocolate ice cream. Also fed pt 1 banana & 1 applesauce. Pt did not like what was on dinner tray. Will ask day shift staff to order more fruit and and soft items on trays. Pt says she likes oatmeal and yogurt as well. Pt drank ab 120cc water with observation via straw. When cued to take slow sips, no coughing observed while swallowing thin liquids. Pt stated she likes old movies - tv station playing classic movies put on for her. Pt thankful to staff for their care. Fall precautions in place, non skid socks, bed alarm active, call bell within reach, bed in low locked position. Door open. No 1:1 sitter at bedside due to staffing shortage. Pt denies any needs prior to staff leaving room.   22:00   Pt up ambulated halls of entire unit with stand by assist using walker.   22:30  Pt anxious and restless once back in bed. Became agitated with staff redirection. PRN haldol  administered per order for agitation.  22:52  PRN trazodone administered per order for sleep.   00:50  Staff unable to leave bedside due to increasing agitation and restlessness. Pt refusing to stay in the bed, constantly attempting to climb over railing. Pt ambulated to bathroom 3-4 times to urinate. Denies hunger or thirst, denies pain. Pt becoming combative with staff redirection. PRN geodon administered per order.  02:07  Pt sill very restless, constantly trying to get out of bed. Staff having to remain at bedside as much as possible to ensure safety.  03:30  Pt finally resting, no longer climbing out of bed. Bed alarm activated.

## 2024-07-21 ENCOUNTER — Telehealth: Payer: Self-pay | Admitting: Neurology

## 2024-07-21 DIAGNOSIS — F03918 Unspecified dementia, unspecified severity, with other behavioral disturbance: Secondary | ICD-10-CM | POA: Diagnosis not present

## 2024-07-21 DIAGNOSIS — Z7189 Other specified counseling: Secondary | ICD-10-CM | POA: Diagnosis not present

## 2024-07-21 DIAGNOSIS — Z515 Encounter for palliative care: Secondary | ICD-10-CM | POA: Diagnosis not present

## 2024-07-21 MED ORDER — STERILE WATER FOR INJECTION IJ SOLN
INTRAMUSCULAR | Status: AC
  Administered 2024-07-21: 10 mL
  Filled 2024-07-21: qty 10

## 2024-07-21 MED ORDER — LORAZEPAM 0.5 MG PO TABS
0.5000 mg | ORAL_TABLET | Freq: Once | ORAL | Status: AC
  Administered 2024-07-21: 0.5 mg via ORAL
  Filled 2024-07-21: qty 1

## 2024-07-21 MED ORDER — TRAZODONE HCL 50 MG PO TABS
100.0000 mg | ORAL_TABLET | Freq: Every day | ORAL | Status: DC
  Administered 2024-07-21 – 2024-08-02 (×13): 100 mg via ORAL
  Filled 2024-07-21 (×13): qty 2

## 2024-07-21 NOTE — Progress Notes (Signed)
 Mobility Specialist: Progress Note   07/21/24 1500  Mobility  Activity Ambulated with assistance  Level of Assistance Contact guard assist, steadying assist  Assistive Device Other (Comment) (HHA)  Distance Ambulated (ft) 125 ft  Activity Response Tolerated well  Mobility Referral Yes  Mobility visit 1 Mobility  Mobility Specialist Start Time (ACUTE ONLY) 1100  Mobility Specialist Stop Time (ACUTE ONLY) 1111  Mobility Specialist Time Calculation (min) (ACUTE ONLY) 11 min    Pt received in chair, pleasantly confused and agreeable to mobility session. CGA with HHA throughout, +2 HHA given during return ambulation to room. Required max cues for forward gaze and to take bigger steps. No complaints. Returned to room. Left in chair with all needs met, call bell in reach. Sitter present.   [name] Mobility Specialist Please contact via SecureChat or Rehab office at [phone]

## 2024-07-21 NOTE — TOC Initial Note (Signed)
 Transition of Care [facility]) - Initial/Assessment Note    Patient Details  Name: [name] MRN: [id number] Date of Birth: [birth year]  Transition of Care [facility]) CM/SW Contact:    Sherline Clack, LCSWA Phone Number: 07/21/2024, 4:55 PM  Clinical Narrative:                  CSW spoke with family and faxed out patient to locked SNF. Sent out a referral to [facility] for [employer]. CSW will continue to follow.    Expected Discharge Plan: Skilled Nursing Facility     Patient Goals and CMS Choice            Expected Discharge Plan and Services                                              Prior Living Arrangements/Services                       Activities of Daily Living   ADL Screening (condition at time of admission) Independently performs ADLs?: No Does the patient have a NEW difficulty with bathing/dressing/toileting/self-feeding that is expected to last >3 days?: Yes (Initiates electronic notice to provider for possible OT consult) Does the patient have a NEW difficulty with getting in/out of bed, walking, or climbing stairs that is expected to last >3 days?: Yes (Initiates electronic notice to provider for possible PT consult) Does the patient have a NEW difficulty with communication that is expected to last >3 days?: Yes (Initiates electronic notice to provider for possible SLP consult) Is the patient deaf or have difficulty hearing?: No Does the patient have difficulty seeing, even when wearing glasses/contacts?: No Does the patient have difficulty concentrating, remembering, or making decisions?: Yes  Permission Sought/Granted                  Emotional Assessment              Admission diagnosis:  Dementia with behavioral disturbance (HCC) [F03.918] Change in behavior [R46.89] Patient Active Problem List   Diagnosis Date Noted   History of dementia 07/10/2024   History of TIA (transient ischemic attack)  07/10/2024   Delirium 12/21/2023   Altered mental status 06/30/2022   Aggressive behavior 01/27/2021   Dementia with behavioral disturbance (HCC) 01/14/2021   Memory loss 09/17/2020   Small vessel disease, 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 [place] [address] Phone: [phone] Fax: [phone]  [facility] [id number] [place], [place] - [address] [place] [address] Phone: [phone] Fax: [phone]  [facility] [address] [place] [address] Phone: [phone] Fax: [phone]  [facility] [address] [place] [address] Phone: [phone] Fax: [phone]     Social Drivers of Health (SDOH) Social History: SDOH Screenings   Food Insecurity: Patient Unable To Answer (07/11/2024)  Housing:  Patient Unable To Answer (07/11/2024)  Transportation Needs: Patient Unable To Answer (07/11/2024)  Utilities: Patient Unable To Answer (07/11/2024)  Social Connections: Unknown (07/11/2024)  Tobacco Use: Low Risk  (07/10/2024)   SDOH Interventions:     Readmission Risk Interventions     No data to display

## 2024-07-21 NOTE — Plan of Care (Signed)

## 2024-07-21 NOTE — Progress Notes (Signed)
 Occupational Therapy Treatment Patient Details Name: [name] MRN: [id number] DOB: [birth year] Today's Date: 07/21/2024   History of present illness Pt is an 83 y.o. female presented 07/10/24 due to UTI symptoms and unsteadiness with repeated falls over the last few days. UA -PMH significant for CAD, HTN, dementia with behavioral disturbances, frequent UTIs, TIA, L  lumbar radiculopathy status post decompression of the left foot drop   OT comments  Patient received in supine and agreeable to work with OT. Patient was able to ambulate to bathroom with HHA and min to CGA for balance. Patient required assistance for clothing management and patient able to perform hygiene seated. Patient stood at sink for grooming tasks before returning to recliner. Family arrived during visit.  Patient will benefit from continued inpatient follow up therapy, <3 hours/day.  Acute OT to continue to follow to address established goals to facilitate DC to next venue of care.        If plan is discharge home, recommend the following:  A little help with walking and/or transfers;A little help with bathing/dressing/bathroom;Assistance with cooking/housework;Assistance with feeding;Direct supervision/assist for medications management;Direct supervision/assist for financial management;Assist for transportation;Help with stairs or ramp for entrance;Supervision due to cognitive status   Equipment Recommendations  Wheelchair (measurements OT)    Recommendations for Other Services      Precautions / Restrictions Precautions Recall of Precautions/Restrictions: Impaired Precaution/Restrictions Comments: frequent falls in days before hospitalization Restrictions Weight Bearing Restrictions Per Provider Order: No       Mobility Bed Mobility Overal bed mobility: Needs Assistance             General bed mobility comments: OOB in recliner    Transfers Overall transfer level: Needs assistance Equipment used:  1 person hand held assist Transfers: Sit to/from Stand Sit to Stand: Min assist           General transfer comment: min assist to stand and CGA to min assist for transfers     Balance Overall balance assessment: Needs assistance Sitting-balance support: Feet supported, Bilateral upper extremity supported, Single extremity supported Sitting balance-Leahy Scale: Fair     Standing balance support: Single extremity supported, During functional activity Standing balance-Leahy Scale: Fair Standing balance comment: HHA for mobility                           ADL either performed or assessed with clinical judgement   ADL Overall ADL's : Needs assistance/impaired     Grooming: Wash/dry hands;Wash/dry face;Brushing hair;Contact guard assist;Cueing for sequencing;Standing Grooming Details (indicate cue type and reason): at sink                 Toilet Transfer: Contact guard assist;Cueing for safety;Cueing for sequencing;Regular Toilet;Grab bars Toilet Transfer Details (indicate cue type and reason): HHA to ambulate to bathroom Toileting- Clothing Manipulation and Hygiene: Contact guard assist;Sit to/from stand              [employer] Communication Communication: No apparent difficulties Factors Affecting Communication: Reduced clarity of speech   Cognition Arousal: Alert Behavior During Therapy: Flat affect Cognition: History of cognitive impairments             OT - Cognition Comments: pleasant but difficulty to understand  Following commands: Impaired Following commands impaired: Follows one step commands inconsistently      Cueing   Cueing Techniques: Verbal cues  Exercises      Shoulder Instructions       General Comments no episodes of agitation, family arrived during visit    Pertinent Vitals/ Pain       Pain  Assessment Pain Assessment: Faces Faces Pain Scale: No hurt Pain Intervention(s): Monitored during session  Home Living                                          Prior Functioning/Environment              Frequency  Min 2X/week        Progress Toward Goals  OT Goals(current goals can now be found in the care plan section)  Progress towards OT goals: Progressing toward goals  Acute Rehab OT Goals Patient Stated Goal: none stated OT Goal Formulation: Patient unable to participate in goal setting Time For Goal Achievement: 07/25/24 Potential to Achieve Goals: Fair ADL Goals Pt Will Perform Upper Body Bathing: with supervision;sitting Pt Will Perform Lower Body Bathing: with contact guard assist;sit to/from stand Pt Will Perform Upper Body Dressing: with supervision;sitting Pt Will Perform Lower Body Dressing: with contact guard assist;sit to/from stand Pt Will Transfer to Toilet: with supervision;ambulating;regular height toilet  Plan      Co-evaluation                 AM-PAC OT 6 Clicks Daily Activity     Outcome Measure   Help from another person eating meals?: A Little Help from another person taking care of personal grooming?: A Little Help from another person toileting, which includes using toliet, bedpan, or urinal?: A Little Help from another person bathing (including washing, rinsing, drying)?: A Little Help from another person to put on and taking off regular upper body clothing?: A Little Help from another person to put on and taking off regular lower body clothing?: A Lot 6 Click Score: 17    End of Session Equipment Utilized During Treatment: Gait belt  OT Visit Diagnosis: Unsteadiness on feet (R26.81);Other abnormalities of gait and mobility (R26.89);Repeated falls (R29.6);Muscle weakness (generalized) (M62.81)   Activity Tolerance Patient tolerated treatment well   Patient Left in chair;with call bell/phone within  reach;with nursing/sitter in room;with family/visitor present   Nurse Communication Mobility status        Time: 8988-8976 OT Time Calculation (min): 12 min  Charges: OT General Charges $OT Visit: 1 Visit OT Treatments $Self Care/Home Management : 8-22 mins  Dick Laine, OTA Acute Rehabilitation Services  Office [phone]   Jeb LITTIE Laine 07/21/2024, 1:59 PM

## 2024-07-21 NOTE — Progress Notes (Signed)
 Palliative Medicine Inpatient Follow Up Note   HPI: 83 y.o. female with past medical history of dementia with behavioral disturbance, TIA, essential HTN, HLD, depression, left lumbar radiculopathy s/p decompression, left foot drop admitted on 07/10/2024 with concern for UTI (UA negative) and worsening agitation and dementia. Required Geodon in ED. Ongoing fluctuation of symptoms that are very unpredictable and difficult to manage.   Today's Discussion 07/21/2024  *Please note that this is a verbal dictation therefore any spelling or grammatical errors are due to the Dragon Medical One system interpretation.  Chart reviewed inclusive of vital signs, progress notes, laboratory results, and diagnostic images. Patient continued to exhibit increasing agitation needing staff redirection overnight, needing to receive PRN Haldol , Trazodone and Geodon.  Latest vital signs reviewed, MEWS color green.   Created space and opportunity for patient to explore thoughts feelings and fears regarding current medical situation.  Visited the patient at bedside today. She was seated in a recliner, appeared chronically ill and demented, but was not in any acute distress. She was unable to engage in meaningful conversation or respond to orientation questions. A 1:1 safety sitter was present. The bedside RN reported the patient had a difficult night and expressed concern about her ability to swallow whole pills. It was recommended to crush remaining medications to reduce aspiration risk. I revisited the patient in the afternoon and noted increased restlessness. She was unable to remain seated and was observed pacing the hallway frequently. Her daughter, [name], who identified herself as the HCPOA, was present at bedside.  I provided [name] with a medical update, including the patient's persistent overnight behaviors and increased agitation. I explained that staff administered three PRN medications (Haldol , Trazodone,  and Geodon) to help manage her symptoms. We reviewed the progressive nature of dementia and its expected trajectory. I reassured her that the patient is receiving care aligned with her and the family's goals.  We discussed the challenges of managing dementia-related behaviors and the ongoing efforts to adjust medications appropriately. [name] acknowledged the seriousness of her mother's condition and expressed how distressing it is to witness. The current plan remains to try to stabilize behaviors, with the goal of considering memory care placement once safe. We also explored treatment pathways, including the option of comfort-focused care in the context of advanced dementia. [name] shared that, given her mother's current state, she believes there is no meaningful quality of life and feels confident her mother would not want to endure this if she were able to decide. I explained that comfort-focused care/hospice, prioritizes symptom management and quality of life, often involving liberal use of medications to ensure comfort. I clarified that this may result in reduced mobility, alertness, and interaction, but would minimize distressing symptoms. [name] expressed understanding and is strongly considering this approach, though she would like to discuss it further with other family members. We also reviewed hospice options, including in-home and custodial settings.  I shared the immediate plan to schedule Trazodone at 8 PM and consider increasing the daytime dose of Zyprexa tomorrow if restlessness persists. [name] agreed with this plan and expressed appreciation for the discussion.  Update (16:20): RN reported that the patient became increasingly restless, pacing the hallway and difficult to redirect. Plan is to administer a one-time dose of Ativan  0.5 mg PO. Verified with the daughter that the patient is not allergic to Ativan ; she noted past intolerance only at higher doses. She confirmed that low-dose Ativan   has previously been helpful without worsening agitation.  Family face treatment  option decisions, advanced directive decisions and anticipatory care needs.  Questions and concerns addressed.  Palliative Support Provided.   Objective Assessment: Vital Signs Vitals:   07/21/24 0300 07/21/24 0809  BP:  130/60  Pulse:  (!) 53  Resp: 18 19  Temp:  98.5 F (36.9 C)  SpO2:      Intake/Output Summary (Last 24 hours) at 07/21/2024 0849 Last data filed at 07/20/2024 2300 Gross per 24 hour  Intake 325 ml  Output --  Net 325 ml   Last Weight  Most recent update: 07/11/2024  3:37 PM    Weight  53.5 kg (118 lb)             Examination:   General exam: Chronically-ill appearing, frail elderly Respiratory system: NAD Cardiovascular system: HR Gastrointestinal system: Abdomen is non distended, soft and non tender.   Central nervous system: Alert, with confusion. Skin: Warm and moist  SUMMARY OF RECOMMENDATIONS    Recommendations/Plan:  Code Status: Maintain DNR-Limited Avoid escalation of care to ICU or invasive measures  Schedule the Trazodone 100mg  PO at 2000H instead of PRN Continue Zyprexa for agitation: Zyprexa 5 mg in the am and Zyprexa 10 mg at bedtime.  Consider increasing the daytime Zyprexa to 10 mg tomorrow if behaviors tonight not improved.  Discussed with [name] considering comfort-focused approach given the progressive nature of dementia with ongoing behaviors. [name] strongly considering this pathway pending family discussions Consider search for memory care placement .  Delirium precautions Continue to provide psycho-social and emotional support to patient and family Palliative medicine team will continue to follow.    Time Spent: 50 minutes  Detailed review of medical records (labs, imaging, vital signs), medically appropriate exam, discussed with treatment team, counseling and education to patient, family, & staff, documenting clinical information,  coordination of care.   ______________________________________________________________________________________ Kathlyne Bolder NP-C [place] Palliative Medicine Team Team Cell Phone: [phone] Please utilize secure chat with additional questions, if there is no response within 30 minutes please call the above phone number  Palliative Medicine Team providers are available by phone from 7am to 7pm daily and can be reached through the team cell phone.  Should this patient require assistance outside of these hours, please call the patient's attending physician.

## 2024-07-21 NOTE — Progress Notes (Signed)
 PROGRESS NOTE    [name]  FMW:[id number] DOB: 1940/09/30 DOA: 07/10/2024 PCP: Debrah Josette ORN., PA-C   Brief Narrative:  This 83 yrs old Female with h/o progressive dementia with behavioral disturbance, TIA, HTN, HLD, depression, left lumbar radiculopathy status post decompression, chronic gait abnormality presented to the ED due to worsening confusion more than baseline with aggression / increased irritability for about 1 week. History of frequent falls from unsteady gait, recently about 4 days PTA. Patient follows with Neurology at [facility] and behavioral health, plans for patient to be referred to geriatric psychiatry was initiated. In the ED, Patient was hemodynamically stable, labs, UA, unremarkable. CT head /cervical spine with no acute intracranial abnormality, CT abdomen/pelvis with no acute findings, except for 12 mm nonobstructive renal calculus on the left, No hydronephrosis, large hiatal hernia. Daughter was concerned about UTI and also reported being unable to care for patient at home. Psychiatry consulted > increased Seroquel , added Haldol  as needed.  Assessment & Plan:   Principal Problem:   Dementia with behavioral disturbance (HCC) Active Problems:   Altered mental status   Hyperlipidemia   Left foot drop   Gait abnormality   History of dementia   History of TIA (transient ischemic attack)  Dementia with worsening behavioral disturbance: Increased agitation: Patient has been working with outpatient behavioral health, Neurology and was referred to geriatric neuropsychiatry. Inpatient psychiatric previously consulted, recommended adding Seroquel  75 mg at bedtime, continuing Zoloft  200 mg daily, continue trazodone 100 mg at bedtime as needed, Geodon 20 mg IM every 6 hours as needed for severe agitation as well as Haldol .  Still restless and confused. Palliative Care following.  Plan for zyprexa 2.5mg  added to AM dose with bedtime dose increased to 10mg .    History of left-sided foot drop: History of unsteady gait: History of frequent falls: CT head, CT cervical spine, CT abdomen pelvis unremarkable. Reports some chronic back pain. Scheduled Tylenol  x 2 days. Continue fall precaution PT/OT Evaluation > SNF   Essential hypertension Continue Imdur .   Hyperlipidemia Continue Zocor .   History of TIA Continue aspirin  and Zocor .  DVT prophylaxis: Lovenox  Code Status: DNR Family Communication: Daughter at bedside Disposition Plan:   Status is: Inpatient Remains inpatient appropriate because: Severity of illness.   Consultants:  Psychiatry  Procedures: None Antimicrobials: None  Subjective: Patient was seen and examined at bedside.  Overnight she was agitated, trying to get out of bed. Patient seems improved now and sleeping.  Denies any concerns.   Patient has difficult to manage agitation. She does have intermittent agitation spells.  Objective: Vitals:   07/20/24 2000 07/21/24 0300 07/21/24 0809 07/21/24 1233  BP:   130/60 99/64  Pulse: 71  (!) 53 (!) 101  Resp: 18 18 19 18   Temp:   98.5 F (36.9 C) 98 F (36.7 C)  TempSrc:    Oral  SpO2: 98%   97%  Weight:      Height:        Intake/Output Summary (Last 24 hours) at 07/21/2024 1336 Last data filed at 07/20/2024 2300 Gross per 24 hour  Intake 175 ml  Output --  Net 175 ml   Filed Weights   07/11/24 1152  Weight: 53.5 kg    Examination:  General exam: Appears calm and comfortable, not in any acute distress. Respiratory system: CTA Bilaterally . Respiratory effort normal.  RR 13 Cardiovascular system: S1 & S2 heard, RRR. No JVD, murmurs, rubs, gallops or clicks. Gastrointestinal system: Abdomen is  non distended, soft and non tender.  Normal bowel sounds heard. Central nervous system: Alert and oriented x 2. No focal neurological deficits. Extremities: No edema, no cyanosis, no clubbing. Skin: No rashes, lesions or ulcers Psychiatry: Judgement and  insight appear normal. Mood & affect appropriate.   Data Reviewed: I have personally reviewed following labs and imaging studies  CBC: No results for input(s): WBC, NEUTROABS, HGB, HCT, MCV, PLT in the last 168 hours.  Basic Metabolic Panel: No results for input(s): NA, K, CL, CO2, GLUCOSE, BUN, CREATININE, CALCIUM , MG, PHOS in the last 168 hours.  GFR: Estimated Creatinine Clearance: 45.8 mL/min (by C-G formula based on SCr of 0.78 mg/dL). Liver Function Tests: No results for input(s): AST, ALT, ALKPHOS, BILITOT, PROT, ALBUMIN in the last 168 hours. No results for input(s): LIPASE, AMYLASE in the last 168 hours. No results for input(s): AMMONIA in the last 168 hours. Coagulation Profile: No results for input(s): INR, PROTIME in the last 168 hours. Cardiac Enzymes: No results for input(s): CKTOTAL, CKMB, CKMBINDEX, TROPONINI in the last 168 hours. BNP (last 3 results) No results for input(s): PROBNP in the last 8760 hours. HbA1C: No results for input(s): HGBA1C in the last 72 hours. CBG: No results for input(s): GLUCAP in the last 168 hours. Lipid Profile: No results for input(s): CHOL, HDL, LDLCALC, TRIG, CHOLHDL, LDLDIRECT in the last 72 hours. Thyroid Function Tests: No results for input(s): TSH, T4TOTAL, FREET4, T3FREE, THYROIDAB in the last 72 hours. Anemia Panel: No results for input(s): VITAMINB12, FOLATE, FERRITIN, TIBC, IRON, RETICCTPCT in the last 72 hours. Sepsis Labs: No results for input(s): PROCALCITON, LATICACIDVEN in the last 168 hours.  Recent Results (from the past 240 hours)  Culture, blood (Routine X 2) w Reflex to ID Panel     Status: None   Collection Time: 07/12/24  6:19 AM   Specimen: BLOOD  Result Value Ref Range Status   Specimen Description BLOOD SITE NOT SPECIFIED  Final   Special Requests   Final    BOTTLES DRAWN AEROBIC AND ANAEROBIC Blood  Culture results may not be optimal due to an inadequate volume of blood received in culture bottles   Culture   Final    NO GROWTH 5 DAYS Performed at [facility], [address], [place], [place] [address]    Report Status 07/17/2024 FINAL  Final  Culture, blood (Routine X 2) w Reflex to ID Panel     Status: None   Collection Time: 07/12/24  6:25 AM   Specimen: BLOOD  Result Value Ref Range Status   Specimen Description BLOOD SITE NOT SPECIFIED  Final   Special Requests   Final    BOTTLES DRAWN AEROBIC AND ANAEROBIC Blood Culture adequate volume   Culture   Final    NO GROWTH 5 DAYS Performed at [facility], [address], [place], [place] [address]    Report Status 07/17/2024 FINAL  Final   Radiology Studies: No results found.  Scheduled Meds:  aspirin   81 mg Oral Daily   enoxaparin  (LOVENOX ) injection  40 mg Subcutaneous Q24H   feeding supplement  237 mL Oral BID BM   isosorbide  mononitrate  60 mg Oral Daily   levothyroxine   75 mcg Oral Q0600   OLANZapine zydis  10 mg Oral QHS   Or   OLANZapine  10 mg Intramuscular QHS   OLANZapine  5 mg Oral Daily   sertraline   200 mg Oral Daily   simvastatin   40 mg  Oral QHS   sodium chloride  flush  3 mL Intravenous Q12H   sodium chloride  flush  3 mL Intravenous Q12H   Continuous Infusions:   LOS: 9 days    Time spent: 35 mins    Darcel Dawley, MD [facility]   If 7PM-7AM, please contact night-coverage

## 2024-07-21 NOTE — Telephone Encounter (Signed)
 Patient's daughter, [name] Sims canceling appointment due to patient in the hospital.

## 2024-07-21 NOTE — NC FL2 (Signed)
 [facility]  MEDICAID [id number] LEVEL OF CARE FORM     IDENTIFICATION  Patient Name: [name] Birthdate: Mar 10, 1941 Sex: female Admission Date (Current Location): 07/10/2024  [facility] and [place] Number:  Producer, television/film/video and Address:  [facility] [place], [address], [place], [place] [address]      Provider Number: [id number]  Attending Physician Name and Address:  Leotis Bogus, MD  Relative Name and Phone Number:  Melanie Che: [phone]    Current Level of Care: Hospital Recommended Level of Care: Skilled Nursing Facility Prior Approval Number:    Date Approved/Denied:   PASRR Number:    Discharge Plan: SNF    Current Diagnoses: Patient Active Problem List   Diagnosis Date Noted   History of dementia 07/10/2024   History of TIA (transient ischemic attack) 07/10/2024   Delirium 12/21/2023   Altered mental status 06/30/2022   Aggressive behavior 01/27/2021   Dementia with behavioral disturbance (HCC) 01/14/2021   Memory loss 09/17/2020   Small vessel disease, cerebrovascular 07/20/2018   Left foot drop 05/18/2018   TIA (transient ischemic attack) 05/18/2018   Gait abnormality 05/18/2018   S/P left THA, AA 07/17/2014   Hypertension    Hyperlipidemia    Coronary artery disease    Spinal stenosis     Orientation RESPIRATION BLADDER Height & Weight     Self  Normal Continent Weight: 118 lb (53.5 kg) Height:  5' 4 (162.6 cm)  BEHAVIORAL SYMPTOMS/MOOD NEUROLOGICAL BOWEL NUTRITION STATUS  Wanderer   Continent Diet (Please refer to dc summary)  AMBULATORY STATUS COMMUNICATION OF NEEDS Skin   Limited Assist Verbally Normal                       Personal Care Assistance Level of Assistance  Bathing, Feeding, Dressing Bathing Assistance: Limited assistance Feeding assistance: Independent Dressing Assistance: Limited assistance     Functional Limitations Info  Sight, Hearing, Speech Sight Info: Adequate (glasses) Hearing  Info: Adequate Speech Info: Adequate    SPECIAL CARE FACTORS FREQUENCY  PT (By licensed PT), OT (By licensed OT)     PT Frequency: 5x/week OT Frequency: 5x/week            Contractures Contractures Info: Not present    Additional Factors Info  Code Status, Allergies Code Status Info: DNR Allergies Info: Ativan , NSAIDs           Current Medications (07/21/2024):  This is the current hospital active medication list Current Facility-Administered Medications  Medication Dose Route Frequency Provider Last Rate Last Admin   aspirin  chewable tablet 81 mg  81 mg Oral Daily Sundil, Subrina, MD   81 mg at 07/21/24 1117   enoxaparin  (LOVENOX ) injection 40 mg  40 mg Subcutaneous Q24H Ezenduka, Nkeiruka J, MD   40 mg at 07/19/24 1438   feeding supplement (ENSURE PLUS HIGH PROTEIN) liquid 237 mL  237 mL Oral BID BM Ezenduka, Nkeiruka J, MD   237 mL at 07/21/24 1406   haloperidol  lactate (HALDOL ) injection 2 mg  2 mg Intramuscular Q6H PRN Joseph, Preetha, MD   2 mg at 07/20/24 2204   Or   haloperidol  lactate (HALDOL ) injection 2 mg  2 mg Intravenous Q6H PRN Joseph, Preetha, MD   2 mg at 07/12/24 2139   isosorbide  mononitrate (IMDUR ) 24 hr tablet 60 mg  60 mg Oral Daily Sundil, Subrina, MD   60 mg at 07/21/24 1118   levothyroxine  (SYNTHROID ) tablet 75 mcg  75  mcg Oral Q0600 Sundil, Subrina, MD   75 mcg at 07/21/24 0830   OLANZapine zydis (ZYPREXA) disintegrating tablet 10 mg  10 mg Oral QHS Parker, Alicia C, NP   10 mg at 07/20/24 1953   Or   OLANZapine (ZYPREXA) injection 10 mg  10 mg Intramuscular QHS Parker, Alicia C, NP       OLANZapine (ZYPREXA) tablet 5 mg  5 mg Oral Daily Parker, Alicia C, NP   5 mg at 07/21/24 1117   ondansetron  (ZOFRAN ) tablet 4 mg  4 mg Oral Q6H PRN Sundil, Subrina, MD   4 mg at 07/21/24 1118   Or   ondansetron  (ZOFRAN ) injection 4 mg  4 mg Intravenous Q6H PRN Sundil, Subrina, MD       sertraline  (ZOLOFT ) tablet 200 mg  200 mg Oral Daily Sundil, Subrina, MD    200 mg at 07/21/24 1117   simvastatin  (ZOCOR ) tablet 40 mg  40 mg Oral QHS Sundil, Subrina, MD   40 mg at 07/20/24 1953   sodium chloride  flush (NS) 0.9 % injection 3 mL  3 mL Intravenous Q12H Sundil, Subrina, MD   3 mL at 07/21/24 1407   sodium chloride  flush (NS) 0.9 % injection 3 mL  3 mL Intravenous Q12H Sundil, Subrina, MD   3 mL at 07/21/24 1406   sodium chloride  flush (NS) 0.9 % injection 3 mL  3 mL Intravenous PRN Sundil, Subrina, MD       traZODone (DESYREL) tablet 100 mg  100 mg Oral Q2000 Macrohon, Erwin F Jr., NP       ziprasidone (GEODON) injection 20 mg  20 mg Intramuscular Q6H PRN Cindy Garnette POUR, MD   20 mg at 07/21/24 0050     Discharge Medications: Please see discharge summary for a list of discharge medications.  Relevant Imaging Results:  Relevant Lab Results:   Additional Information Pt's SSN: [id number]  Sherline Clack, [place]

## 2024-07-22 DIAGNOSIS — F03918 Unspecified dementia, unspecified severity, with other behavioral disturbance: Secondary | ICD-10-CM | POA: Diagnosis not present

## 2024-07-22 DIAGNOSIS — Z515 Encounter for palliative care: Secondary | ICD-10-CM | POA: Diagnosis not present

## 2024-07-22 DIAGNOSIS — Z7189 Other specified counseling: Secondary | ICD-10-CM | POA: Diagnosis not present

## 2024-07-22 DIAGNOSIS — R4689 Other symptoms and signs involving appearance and behavior: Secondary | ICD-10-CM

## 2024-07-22 MED ORDER — STERILE WATER FOR INJECTION IJ SOLN
INTRAMUSCULAR | Status: AC
  Filled 2024-07-22: qty 10

## 2024-07-22 NOTE — Progress Notes (Signed)
 PROGRESS NOTE    [name]  FMW:[id number] DOB: Nov 27, 1940 DOA: 07/10/2024 PCP: Debrah Josette ORN., PA-C   Brief Narrative:  This 83 yrs old Female with h/o progressive dementia with behavioral disturbance, TIA, HTN, HLD, depression, left lumbar radiculopathy status post decompression, chronic gait abnormality presented to the ED due to worsening confusion more than baseline with aggression / increased irritability for about 1 week. History of frequent falls from unsteady gait, recently about 4 days PTA. Patient follows with Neurology at [facility] and behavioral health, plans for patient to be referred to geriatric psychiatry was initiated. In the ED, Patient was hemodynamically stable, labs, UA, unremarkable. CT head /cervical spine with no acute intracranial abnormality, CT abdomen/pelvis with no acute findings, except for 12 mm nonobstructive renal calculus on the left, No hydronephrosis, large hiatal hernia. Daughter was concerned about UTI and also reported being unable to care for patient at home. Psychiatry consulted > increased Seroquel , added Haldol  as needed.  Assessment & Plan:   Principal Problem:   Dementia with behavioral disturbance (HCC) Active Problems:   Altered mental status   Hyperlipidemia   Left foot drop   Gait abnormality   History of dementia   History of TIA (transient ischemic attack)  Dementia with worsening behavioral disturbance: Increased agitation: Patient has been working with outpatient behavioral health, Neurology and was referred to geriatric neuropsychiatry. Inpatient psychiatric previously consulted, recommended adding Seroquel  75 mg at bedtime, continuing Zoloft  200 mg daily, continue trazodone 100 mg at bedtime as needed, Geodon 20 mg IM every 6 hours as needed for severe agitation as well as Haldol .  Still restless and confused. Palliative Care following.  Plan for zyprexa 2.5mg  added to AM dose with bedtime dose increased to 10mg .    History of left-sided foot drop: History of unsteady gait: History of frequent falls: CT head, CT cervical spine, CT abdomen pelvis unremarkable. Reports some chronic back pain. Scheduled Tylenol  x 2 days. Continue fall precaution PT/OT Evaluation > SNF   Essential hypertension: Continue Imdur .   Hyperlipidemia: Continue Zocor .   History of TIA: Continue aspirin  and Zocor .  DVT prophylaxis: Lovenox  Code Status: DNR Family Communication: Daughter at bedside Disposition Plan:   Status is: Inpatient Remains inpatient appropriate because: Severity of illness.   Consultants:  Psychiatry  Procedures: None Antimicrobials: None  Subjective: Patient was seen and examined at bedside.  Overnight She was agitated, trying to get out of bed. Patient seems improved now and sleeping.  Denies any concerns.   Patient has difficult to manage agitation. She does have intermittent agitation spells.  Objective: Vitals:   07/21/24 1900 07/22/24 0300 07/22/24 0900 07/22/24 1215  BP:   (!) 123/102 118/68  Pulse:   88 80  Resp: 16 16 16 19   Temp:    98.8 F (37.1 C)  TempSrc:      SpO2:   96% 100%  Weight:      Height:       No intake or output data in the 24 hours ending 07/22/24 1418  Filed Weights   07/11/24 1152  Weight: 53.5 kg    Examination:  General exam: Appears calm and comfortable, not in any acute distress. Respiratory system: CTA Bilaterally . Respiratory effort normal.  RR 13 Cardiovascular system: S1 & S2 heard, RRR. No JVD, murmurs, rubs, gallops or clicks. Gastrointestinal system: Abdomen is non distended, soft and non tender.  Normal bowel sounds heard. Central nervous system: Alert and oriented x 2. No focal neurological deficits.  Extremities: No edema, no cyanosis, no clubbing. Skin: No rashes, lesions or ulcers Psychiatry: Judgement and insight appear normal. Mood & affect appropriate.   Data Reviewed: I have personally reviewed following labs and  imaging studies  CBC: No results for input(s): WBC, NEUTROABS, HGB, HCT, MCV, PLT in the last 168 hours.  Basic Metabolic Panel: No results for input(s): NA, K, CL, CO2, GLUCOSE, BUN, CREATININE, CALCIUM , MG, PHOS in the last 168 hours.  GFR: Estimated Creatinine Clearance: 45.8 mL/min (by C-G formula based on SCr of 0.78 mg/dL). Liver Function Tests: No results for input(s): AST, ALT, ALKPHOS, BILITOT, PROT, ALBUMIN in the last 168 hours. No results for input(s): LIPASE, AMYLASE in the last 168 hours. No results for input(s): AMMONIA in the last 168 hours. Coagulation Profile: No results for input(s): INR, PROTIME in the last 168 hours. Cardiac Enzymes: No results for input(s): CKTOTAL, CKMB, CKMBINDEX, TROPONINI in the last 168 hours. BNP (last 3 results) No results for input(s): PROBNP in the last 8760 hours. HbA1C: No results for input(s): HGBA1C in the last 72 hours. CBG: No results for input(s): GLUCAP in the last 168 hours. Lipid Profile: No results for input(s): CHOL, HDL, LDLCALC, TRIG, CHOLHDL, LDLDIRECT in the last 72 hours. Thyroid Function Tests: No results for input(s): TSH, T4TOTAL, FREET4, T3FREE, THYROIDAB in the last 72 hours. Anemia Panel: No results for input(s): VITAMINB12, FOLATE, FERRITIN, TIBC, IRON, RETICCTPCT in the last 72 hours. Sepsis Labs: No results for input(s): PROCALCITON, LATICACIDVEN in the last 168 hours.  No results found for this or any previous visit (from the past 240 hours).  Radiology Studies: No results found.  Scheduled Meds:  aspirin   81 mg Oral Daily   enoxaparin  (LOVENOX ) injection  40 mg Subcutaneous Q24H   feeding supplement  237 mL Oral BID BM   isosorbide  mononitrate  60 mg Oral Daily   levothyroxine   75 mcg Oral Q0600   OLANZapine zydis  10 mg Oral QHS   Or   OLANZapine  10 mg Intramuscular QHS   OLANZapine  5 mg  Oral Daily   sertraline   200 mg Oral Daily   simvastatin   40 mg Oral QHS   sodium chloride  flush  3 mL Intravenous Q12H   sodium chloride  flush  3 mL Intravenous Q12H   traZODone  100 mg Oral Q2000   Continuous Infusions:   LOS: 10 days    Time spent: 35 mins    Darcel Dawley, MD [facility]   If 7PM-7AM, please contact night-coverage

## 2024-07-22 NOTE — Progress Notes (Addendum)
 Palliative Medicine Inpatient Follow Up Note   HPI: 83 y.o. female with past medical history of dementia with behavioral disturbance, TIA, essential HTN, HLD, depression, left lumbar radiculopathy s/p decompression, left foot drop admitted on 07/10/2024 with concern for UTI (UA negative) and worsening agitation and dementia. Required Geodon in ED. Ongoing fluctuation of symptoms that are very unpredictable and difficult to manage.   Today's Discussion 07/22/2024  *Please note that this is a verbal dictation therefore any spelling or grammatical errors are due to the Dragon Medical One system interpretation.  Chart reviewed inclusive of vital signs, progress notes, laboratory results, and diagnostic images.   Performed chart review. No significant event overnight. Patient did not require any PRN medications for agitation or restlessness.   Seen patient at bedside today, no family member present. Patient comfortably sleeping on bed on semi-fowlers position, not in any form of distress. Bedside RN shared that patient was awake earlier and was walking supervised in the unit's hallway.  RN shared the daughter [name] came to visit patient early today and no questions or concerns raised.   Shortly after the bedside visit, I contacted [name] (daughter) to provide a clinical update and revisit goals of care. I informed her that the patient continues to exhibit dementia-related behaviors but has improved, and I reviewed the recent medication adjustments made to help manage these symptoms. I shared that no PRN medications were administered overnight, which is a positive development and may suggest some stabilization.  We discussed the prognosis of advanced dementia, including its progressive nature and the challenges it presents in maintaining quality of life. I explained that if the patient's behaviors become increasingly distressing and begin to compromise her comfort and dignity, a transition to a  comfort-focused care pathway may be appropriate. I clarified that this approach prioritizes symptom relief and quality of life, and may involve medications that could lead to increased sedation or drowsiness. [name] verbalized understanding and acknowledged that symptom control is essential, even if it results in reduced alertness.  [name] became emotional during the conversation and expressed how difficult it is to process the situation, stating, "It's just so hard to wrap my head around." She shared concerns about her ability to care for the patient safely at home if behaviors persist. She is considering a memory care unit but finds the idea of placing her mother in a locked facility emotionally challenging. [name] mentioned that she will discuss this conversation with her sister Denies. I reassured her that these feelings are valid and that the Palliative Medicine Team (PMT) is here to support her and the family through this process.  Goals of care remains unchanged. Given that no PRN medication administered last night for agitation, we will continue to monitor patient's behavior and plan to make adjustments as needed/appropriate. Plan is for patient to ultimately be able to return home with daughter, or be placed in a memory care unit/facility. Also continued open discussion with family regarding comfort-focused care given patient's progressive dementia.  Created space and opportunity for patient to explore thoughts feelings and fears regarding current medical situation.  Family face treatment option decisions, advanced directive decisions and anticipatory care needs.  Questions and concerns addressed.  Palliative Support Provided.   Objective Assessment: Vital Signs Vitals:   07/21/24 1900 07/22/24 0300  BP:    Pulse:    Resp: 16 16  Temp:    SpO2:     No intake or output data in the 24 hours ending 07/22/24 0848  Last Weight  Most recent update: 07/11/2024  3:37 PM    Weight  53.5  kg (118 lb)             Examination:   General exam: Chronically-ill appearing, frail elderly Respiratory system: NAD Cardiovascular system: HR Gastrointestinal system: Abdomen is non distended, soft and non tender.   Central nervous system: Alert, with confusion. Skin: Warm and moist  SUMMARY OF RECOMMENDATIONS    Recommendations/Plan:  Code Status: Maintain DNR-Limited Avoid escalation of care to ICU or invasive measures  Continue Trazodone 100mg  PO at 2000H instead of PRN Continue Zyprexa for agitation: Zyprexa 5 mg in the am and Zyprexa 10 mg at bedtime.  Consider increasing the daytime Zyprexa to 10 mg tomorrow if behaviors tonight not improved.  Discussed with [name] considering comfort-focused approach given the progressive nature of dementia with ongoing behaviors. [name] strongly considering this pathway pending family discussions Consider search for memory care placement .  Delirium precautions Continue to provide psycho-social and emotional support to patient and family Palliative medicine team will continue to follow.    Time Spent: 50 minutes  Detailed review of medical records (labs, imaging, vital signs), medically appropriate exam, discussed with treatment team, counseling and education to patient, family, & staff, documenting clinical information, coordination of care.   ______________________________________________________________________________________ Kathlyne Bolder NP-C [place] Palliative Medicine Team Team Cell Phone: [phone] Please utilize secure chat with additional questions, if there is no response within 30 minutes please call the above phone number  Palliative Medicine Team providers are available by phone from 7am to 7pm daily and can be reached through the team cell phone.  Should this patient require assistance outside of these hours, please call the patient's attending physician.

## 2024-07-22 NOTE — Plan of Care (Signed)
  Problem: Clinical Measurements: Goal: Ability to maintain clinical measurements within normal limits will improve Outcome: Progressing Goal: Will remain free from infection Outcome: Progressing Goal: Diagnostic test results will improve Outcome: Progressing Goal: Respiratory complications will improve Outcome: Progressing Goal: Cardiovascular complication will be avoided Outcome: Progressing   Problem: Activity: Goal: Risk for activity intolerance will decrease Outcome: Progressing   Problem: Elimination: Goal: Will not experience complications related to bowel motility Outcome: Progressing   Problem: Pain Managment: Goal: General experience of comfort will improve and/or be controlled Outcome: Progressing   Problem: Safety: Goal: Ability to remain free from injury will improve Outcome: Progressing   Problem: Skin Integrity: Goal: Risk for impaired skin integrity will decrease Outcome: Progressing

## 2024-07-22 NOTE — Progress Notes (Signed)
 Physical Therapy Treatment Patient Details Name: [name] MRN: [id number] DOB: [birth year] Today's Date: 07/22/2024   History of Present Illness Pt is an 83 y.o. female presented 07/10/24 due to UTI symptoms and unsteadiness with repeated falls over the last few days. UA -PMH significant for CAD, HTN, dementia with behavioral disturbances, frequent UTIs, TIA, L  lumbar radiculopathy status post decompression of the left foot drop    PT Comments  Pt received in recliner, restless and decreased orientation, unaware of location/situation/time, clearance per RN for session as pt not oriented. Pt needing up to minA for transfers, supervision for transfer from supine to long sitting and +2 modA for long sitting posterior scooting in chair for repositioning for pt safety. Pt restless and decreased following of safety cues so defer gait trial but pt participating in chair push-ups, LE ROM exercises and pt given Lap activity mat attached to her chair via velcro to see if this will help to redirect her to safer activity and prevent excessive attempts at standing unassisted. Pt remains very high fall risk due to restlessness and decreased orientation. Patient will benefit from continued inpatient follow up therapy, <3 hours/day.    If plan is discharge home, recommend the following: A little help with walking and/or transfers;A little help with bathing/dressing/bathroom;Assistance with cooking/housework;Direct supervision/assist for medications management;Direct supervision/assist for financial management;Assist for transportation;Help with stairs or ramp for entrance;Supervision due to cognitive status   Can travel by private vehicle     No  Equipment Recommendations  None recommended by PT    Recommendations for Other Services       Precautions / Restrictions Precautions Precautions: Fall Recall of Precautions/Restrictions: Impaired Precaution/Restrictions Comments: frequent falls in days  before hospitalization Restrictions Weight Bearing Restrictions Per Provider Order: No     Mobility  Bed Mobility Overal bed mobility: Needs Assistance Bed Mobility: Supine to Sit, Sit to Supine     Supine to sit: Supervision Sit to supine: Supervision   General bed mobility comments: from fully reclined chair to long sitting x10 reps, Supervision for pt safety.    Transfers Overall transfer level: Needs assistance Equipment used: 1 person hand held assist Transfers: Sit to/from Stand, Bed to chair/wheelchair/BSC Sit to Stand: Min assist       Anterior-Posterior transfers: Mod assist, +2 physical assistance   General transfer comment: minA for stability upon standing, guided to sit back down on chair for her safety. +2 modA for posterior seated scooting back in chair x2 reps with cues for pt to assist with UE on chair handles, chair pad assist as pt had scooted too far forward in chair and was at risk of sliding forward out of recliner    Ambulation/Gait               General Gait Details: PTA defer due to pt increased restlessness and confusion/decreased orientation, pt not consistently following cues.   Stairs             Wheelchair Mobility     Tilt Bed    Modified Rankin (Stroke Patients Only)       Balance Overall balance assessment: Needs assistance Sitting-balance support: Feet supported, Bilateral upper extremity supported, Single extremity supported Sitting balance-Leahy Scale: Fair     Standing balance support: Single extremity supported, During functional activity Standing balance-Leahy Scale: Fair Standing balance comment: HHA for mobility  Communication Communication Communication: No apparent difficulties Factors Affecting Communication: Reduced clarity of speech  Cognition Arousal: Alert Behavior During Therapy: Impulsive, Restless   PT - Cognitive impairments: History of cognitive  impairments                       PT - Cognition Comments: Limited assessment, pt restless and constantly trying to get up out of chair, poor carryover of cues to rest, impulsive Following commands: Impaired Following commands impaired: Follows one step commands inconsistently    Cueing Cueing Techniques: Verbal cues, Gestural cues  Exercises Other Exercises Other Exercises: seated BLE AROM: LAQ, hip flexion x10 reps ea Other Exercises: chair push-ups x5 reps    General Comments General comments (skin integrity, edema, etc.): PTA assisted RN to place lap activity mat over her chair with velcro to keep it in place to try helping redirect her to cognitive activity/problem solving and decrease attempts at her getting up unassisted.  Pt also given warm folded blankets to hold to see if this helps to calm her, pt very restless this date and chair parked up at nursing station to ensure pt safety.      Pertinent Vitals/Pain Pain Assessment Pain Assessment: PAINAD Faces Pain Scale: No hurt Breathing: normal Negative Vocalization: none Facial Expression: smiling or inexpressive Body Language: tense, distressed pacing, fidgeting Consolability: distracted or reassured by voice/touch PAINAD Score: 2 Pain Intervention(s): Limited activity within patient's tolerance, Monitored during session, Repositioned    Home Living                          Prior Function            PT Goals (current goals can now be found in the care plan section) Acute Rehab PT Goals Patient Stated Goal: unable to state PT Goal Formulation: Patient unable to participate in goal setting Time For Goal Achievement: 07/25/24 Progress towards PT goals: Progressing toward goals    Frequency    Min 2X/week      PT Plan      Co-evaluation              AM-PAC PT 6 Clicks Mobility   Outcome Measure  Help needed turning from your back to your side while in a flat bed without using  bedrails?: None Help needed moving from lying on your back to sitting on the side of a flat bed without using bedrails?: A Little Help needed moving to and from a bed to a chair (including a wheelchair)?: A Lot Help needed standing up from a chair using your arms (e.g., wheelchair or bedside chair)?: A Lot (+2 safety) Help needed to walk in hospital room?: A Lot (+2 safety) Help needed climbing 3-5 steps with a railing? : Total 6 Click Score: 14    End of Session Equipment Utilized During Treatment: Other (comment) (bed pad assist) Activity Tolerance: Other (comment) (pt restless and decreased following of safety commands, so defer gait trial without +2 assist) Patient left: in chair;with call bell/phone within reach;with nursing/sitter in room;Other (comment) (pt at nursing station so chair alarm not set as multiple staff next to her, she has chair alarm pad under her in seat to hook up once she is moved back to her room.) Nurse Communication: Mobility status;Precautions;Other (comment) (trying lap activity mat over chair to see if it helps keep her occupied) PT Visit Diagnosis: Unsteadiness on feet (R26.81);Other abnormalities of gait and mobility (R26.89);Muscle  weakness (generalized) (M62.81);Difficulty in walking, not elsewhere classified (R26.2);Adult, failure to thrive (R62.7);Other symptoms and signs involving the nervous system (R29.898);Repeated falls (R29.6)     Time: 8348-8298 PT Time Calculation (min) (ACUTE ONLY): 10 min  Charges:    $Therapeutic Activity: 8-22 mins PT General Charges $$ ACUTE PT VISIT: 1 Visit                     Aiesha Leland P., PTA Acute Rehabilitation Services Secure Chat Preferred 9a-5:30pm Office: [phone]    Connell HERO [facility] 07/22/2024, 6:21 PM

## 2024-07-22 NOTE — Progress Notes (Signed)
 RE: [name] Date of Birth: 10/07/1940 Date: 07/22/24  Please be advised that the above-named patient has a primary diagnosis of dementia which supersedes any psychiatric diagnosis. Patient will require a short-term nursing home stay - anticipated 30 days or less for rehabilitation and strengthening.  The plan is for return home.

## 2024-07-23 DIAGNOSIS — Z515 Encounter for palliative care: Secondary | ICD-10-CM | POA: Diagnosis not present

## 2024-07-23 DIAGNOSIS — Z8673 Personal history of transient ischemic attack (TIA), and cerebral infarction without residual deficits: Secondary | ICD-10-CM | POA: Diagnosis not present

## 2024-07-23 DIAGNOSIS — F03918 Unspecified dementia, unspecified severity, with other behavioral disturbance: Secondary | ICD-10-CM | POA: Diagnosis not present

## 2024-07-23 DIAGNOSIS — Z7189 Other specified counseling: Secondary | ICD-10-CM | POA: Diagnosis not present

## 2024-07-23 DIAGNOSIS — R4689 Other symptoms and signs involving appearance and behavior: Secondary | ICD-10-CM | POA: Diagnosis not present

## 2024-07-23 LAB — CBC
HCT: 33.2 % — ABNORMAL LOW (ref 36.0–46.0)
Hemoglobin: 11.1 g/dL — ABNORMAL LOW (ref 12.0–15.0)
MCH: 32.6 pg (ref 26.0–34.0)
MCHC: 33.4 g/dL (ref 30.0–36.0)
MCV: 97.6 fL (ref 80.0–100.0)
Platelets: 214 K/uL (ref 150–400)
RBC: 3.4 MIL/uL — ABNORMAL LOW (ref 3.87–5.11)
RDW: 12.1 % (ref 11.5–15.5)
WBC: 5.7 K/uL (ref 4.0–10.5)
nRBC: 0 % (ref 0.0–0.2)

## 2024-07-23 LAB — BASIC METABOLIC PANEL WITH GFR
Anion gap: 9 (ref 5–15)
BUN: 29 mg/dL — ABNORMAL HIGH (ref 8–23)
CO2: 29 mmol/L (ref 22–32)
Calcium: 9.2 mg/dL (ref 8.9–10.3)
Chloride: 101 mmol/L (ref 98–111)
Creatinine, Ser: 0.88 mg/dL (ref 0.44–1.00)
GFR, Estimated: >60 mL/min (ref >60)
Glucose, Bld: 95 mg/dL (ref 70–99)
Potassium: 3.8 mmol/L (ref 3.5–5.1)
Sodium: 139 mmol/L (ref 135–145)

## 2024-07-23 LAB — PHOSPHORUS: Phosphorus: 4.2 mg/dL (ref 2.5–4.6)

## 2024-07-23 LAB — MAGNESIUM: Magnesium: 2.2 mg/dL (ref 1.7–2.4)

## 2024-07-23 MED ORDER — LIDOCAINE 5 % EX PTCH
1.0000 | MEDICATED_PATCH | CUTANEOUS | Status: DC
  Administered 2024-07-23 – 2024-08-03 (×6): 1 via TRANSDERMAL
  Filled 2024-07-23 (×9): qty 1

## 2024-07-23 MED ORDER — STERILE WATER FOR INJECTION IJ SOLN
INTRAMUSCULAR | Status: AC
  Administered 2024-07-23: 10 mL
  Filled 2024-07-23: qty 10

## 2024-07-23 MED ORDER — ACETAMINOPHEN 325 MG PO TABS
650.0000 mg | ORAL_TABLET | Freq: Four times a day (QID) | ORAL | Status: DC | PRN
  Administered 2024-07-23 – 2024-07-24 (×4): 650 mg via ORAL
  Filled 2024-07-23 (×6): qty 2

## 2024-07-23 NOTE — Progress Notes (Signed)
 Palliative Medicine Inpatient Follow Up Note   HPI: 83 y.o. female with past medical history of dementia with behavioral disturbance, TIA, essential HTN, HLD, depression, left lumbar radiculopathy s/p decompression, left foot drop admitted on 07/10/2024 with concern for UTI (UA negative) and worsening agitation and dementia. Required Geodon in ED. Ongoing fluctuation of symptoms that are very unpredictable and difficult to manage.   Today's Discussion 07/23/2024  *Please note that this is a verbal dictation therefore any spelling or grammatical errors are due to the Dragon Medical One system interpretation.  Chart reviewed inclusive of vital signs, progress notes, laboratory results, and diagnostic images.   Performed chart review. It appears that patient continues to exhibit agitation related to dementia. Noted that she received a dose of Haldol  07/23/24 0031 and Geodon 20mg  IM on 07/22/24 at 1635. On 07/21/2024, we changed the Trazodone 100mg  from PRN to scheduled at 2000 in hopes to see some improvement.   Seen patient at bedside today, no family member present. Patient continues to be confused, agitated at times, and hard to redirect. Observed patient with her activity apron. Not in any acute distress. Unable to engage in a meaningful conversation.  I contacted the patient's daughter, [name], by phone to provide an update and to reassure her that we are actively supporting both the patient and the family during this challenging time. I shared that the patient continues to display dementia-related behaviors, including continued agitation over the past 24 hours.  I informed her that we recently transitioned Trazodone from PRN to scheduled dosing, administered earlier in the evening to promote better nighttime rest. Additionally, we are planning to adjust the patient's Zyprexa regimen, currently 5 mg during the day and 10 mg at night, to 10 mg twice daily, with the goal of improving  behavioral symptoms. [name] expressed agreement with the proposed plan and conveyed her appreciation for the care being provided. I also reviewed the plan with Dr. Leotis, who is in agreement.  The overall goals of care remain unchanged. We will continue to monitor the patient's behavior closely and make further medication adjustments as needed. The long-term plan remains to discharge the patient home with her daughter when it is safe to do so, or to consider placement in a memory care facility given the progression of her dementia. I also encouraged the family to continue open discussions about comfort-focused care, considering the patient's advanced dementia, associated behavioral challenges, and the impact on her overall quality of life.  Per nursing staff, patient continues to manifest dementia-related behaviors, pacing, unable to stay still, with periods of agitation. Safety sitter is recommended.   Created space and opportunity for patient to explore thoughts feelings and fears regarding current medical situation.  Family face treatment option decisions, advanced directive decisions and anticipatory care needs.   Questions and concerns addressed.  Palliative Support Provided.   Objective Assessment: Vital Signs Vitals:   07/23/24 0540 07/23/24 0802  BP:  129/81  Pulse: (!) 57 (!) 55  Resp:  20  Temp:  98.3 F (36.8 C)  SpO2:  96%   No intake or output data in the 24 hours ending 07/23/24 0829  Last Weight  Most recent update: 07/11/2024  3:37 PM    Weight  53.5 kg (118 lb)             Examination:   General exam: Chronically-ill appearing, frail elderly Respiratory system: NAD Cardiovascular system: HR Gastrointestinal system: Abdomen is non distended, soft and non tender.  Central nervous system: Alert, with confusion, periods of agitation Skin: Warm and moist  SUMMARY OF RECOMMENDATIONS    Recommendations/Plan:  Code Status: Maintain DNR-Limited Avoid  escalation of care to ICU or invasive measures  Continue Trazodone 100mg  PO at 2000H instead of PRN Adjust the patient's Zyprexa regimen, currently 5 mg during the day and 10 mg at night, to 10 mg twice daily Consider increasing the daytime Zyprexa to 10 mg tomorrow if behaviors tonight not improved.  Discussed with [name] considering comfort-focused approach given the progressive nature of dementia with ongoing behaviors. [name] strongly considering this pathway pending family discussions Consider search for memory care placement .  Delirium precautions Continue to provide psycho-social and emotional support to patient and family Palliative medicine team will continue to follow.    Time Spent: 35 minutes  Detailed review of medical records (labs, imaging, vital signs), medically appropriate exam, discussed with treatment team, counseling and education to patient, family, & staff, documenting clinical information, coordination of care.   ______________________________________________________________________________________ Kathlyne Bolder NP-C [place] Palliative Medicine Team Team Cell Phone: [phone] Please utilize secure chat with additional questions, if there is no response within 30 minutes please call the above phone number  Palliative Medicine Team providers are available by phone from 7am to 7pm daily and can be reached through the team cell phone.  Should this patient require assistance outside of these hours, please call the patient's attending physician.

## 2024-07-23 NOTE — Plan of Care (Signed)

## 2024-07-23 NOTE — Progress Notes (Signed)
 PROGRESS NOTE    [name]  FMW:[id number] DOB: June 20, 1941 DOA: 07/10/2024 PCP: Debrah Josette ORN., PA-C   Brief Narrative:  This 83 yrs old Female with h/o progressive dementia with behavioral disturbance, TIA, HTN, HLD, depression, left lumbar radiculopathy status post decompression, chronic gait abnormality presented to the ED due to worsening confusion more than baseline with aggression / increased irritability for about 1 week. History of frequent falls from unsteady gait, recently about 4 days PTA. Patient follows with Neurology at [facility] and behavioral health, plans for patient to be referred to geriatric psychiatry was initiated. In the ED, Patient was hemodynamically stable, labs, UA, unremarkable. CT head /cervical spine with no acute intracranial abnormality, CT abdomen/pelvis with no acute findings, except for 12 mm nonobstructive renal calculus on the left, No hydronephrosis, large hiatal hernia. Daughter was concerned about UTI and also reported being unable to care for patient at home. Psychiatry consulted > increased Seroquel , added Haldol  as needed.  Assessment & Plan:   Principal Problem:   Dementia with behavioral disturbance (HCC) Active Problems:   Altered mental status   Hyperlipidemia   Left foot drop   Gait abnormality   History of dementia   History of TIA (transient ischemic attack)  Dementia with worsening behavioral disturbance: Increased agitation: Patient has been working with outpatient behavioral health, Neurology and was referred to geriatric neuropsychiatry. Inpatient psychiatric previously consulted, recommended adding Seroquel  75 mg at bedtime, continuing Zoloft  200 mg daily, continue trazodone 100 mg at bedtime as needed, Geodon 20 mg IM every 6 hours as needed for severe agitation as well as Haldol .  Still restless and confused. Palliative Care following.  Plan for zyprexa 2.5mg  added to AM dose with bedtime dose increased to 10mg .    History of left-sided foot drop: History of unsteady gait: History of frequent falls: CT head, CT cervical spine, CT abdomen pelvis unremarkable. Reports some chronic back pain. Scheduled Tylenol  x 2 days. Continue fall precaution PT/OT Evaluation > SNF   Essential hypertension: Continue Imdur .   Hyperlipidemia: Continue Zocor .   History of TIA: Continue aspirin  and Zocor .  DVT prophylaxis: Lovenox  Code Status: DNR Family Communication: Daughter at bedside Disposition Plan:   Status is: Inpatient Remains inpatient appropriate because: Severity of illness.   Consultants:  Psychiatry  Procedures: None Antimicrobials: None  Subjective: Patient was seen and examined at bedside. Overnight events noted. Patient seems improved now and sleeping.  Denies any concerns.   Patient has difficult to manage agitation. She does have intermittent agitation spells.  Objective: Vitals:   07/23/24 0021 07/23/24 0522 07/23/24 0540 07/23/24 0802  BP: 119/73 (!) 112/59  129/81  Pulse: 70 (!) 53 (!) 57 (!) 55  Resp: 16 16  20   Temp: 97.7 F (36.5 C) (!) 97.4 F (36.3 C)  98.3 F (36.8 C)  TempSrc:      SpO2: 98% 99%  96%  Weight:      Height:       No intake or output data in the 24 hours ending 07/23/24 1125  Filed Weights   07/11/24 1152  Weight: 53.5 kg    Examination:  General exam: Appears calm and comfortable, not in any acute distress. Respiratory system: CTA Bilaterally . Respiratory effort normal.  RR 14 Cardiovascular system: S1 & S2 heard, RRR. No JVD, murmurs, rubs, gallops or clicks. Gastrointestinal system: Abdomen is non distended, soft and non tender.  Normal bowel sounds heard. Central nervous system: Alert and oriented x 2. No focal  neurological deficits. Extremities: No edema, no cyanosis, no clubbing. Skin: No rashes, lesions or ulcers Psychiatry: Judgement and insight appear normal. Mood & affect appropriate.   Data Reviewed: I have personally  reviewed following labs and imaging studies  CBC: Recent Labs  Lab 07/23/24 0502  WBC 5.7  HGB 11.1*  HCT 33.2*  MCV 97.6  PLT 214    Basic Metabolic Panel: Recent Labs  Lab 07/23/24 0502  NA 139  K 3.8  CL 101  CO2 29  GLUCOSE 95  BUN 29*  CREATININE 0.88  CALCIUM  9.2  MG 2.2  PHOS 4.2    GFR: Estimated Creatinine Clearance: 41.6 mL/min (by C-G formula based on SCr of 0.88 mg/dL). Liver Function Tests: No results for input(s): AST, ALT, ALKPHOS, BILITOT, PROT, ALBUMIN in the last 168 hours. No results for input(s): LIPASE, AMYLASE in the last 168 hours. No results for input(s): AMMONIA in the last 168 hours. Coagulation Profile: No results for input(s): INR, PROTIME in the last 168 hours. Cardiac Enzymes: No results for input(s): CKTOTAL, CKMB, CKMBINDEX, TROPONINI in the last 168 hours. BNP (last 3 results) No results for input(s): PROBNP in the last 8760 hours. HbA1C: No results for input(s): HGBA1C in the last 72 hours. CBG: No results for input(s): GLUCAP in the last 168 hours. Lipid Profile: No results for input(s): CHOL, HDL, LDLCALC, TRIG, CHOLHDL, LDLDIRECT in the last 72 hours. Thyroid Function Tests: No results for input(s): TSH, T4TOTAL, FREET4, T3FREE, THYROIDAB in the last 72 hours. Anemia Panel: No results for input(s): VITAMINB12, FOLATE, FERRITIN, TIBC, IRON, RETICCTPCT in the last 72 hours. Sepsis Labs: No results for input(s): PROCALCITON, LATICACIDVEN in the last 168 hours.  No results found for this or any previous visit (from the past 240 hours).  Radiology Studies: No results found.  Scheduled Meds:  aspirin   81 mg Oral Daily   enoxaparin  (LOVENOX ) injection  40 mg Subcutaneous Q24H   feeding supplement  237 mL Oral BID BM   isosorbide  mononitrate  60 mg Oral Daily   levothyroxine   75 mcg Oral Q0600   lidocaine   1 patch Transdermal Q24H   OLANZapine zydis   10 mg Oral QHS   Or   OLANZapine  10 mg Intramuscular QHS   OLANZapine  5 mg Oral Daily   sertraline   200 mg Oral Daily   simvastatin   40 mg Oral QHS   sodium chloride  flush  3 mL Intravenous Q12H   sodium chloride  flush  3 mL Intravenous Q12H   traZODone  100 mg Oral Q2000   Continuous Infusions:   LOS: 11 days    Time spent: 35 mins    Darcel Dawley, MD [facility]   If 7PM-7AM, please contact night-coverage

## 2024-07-24 DIAGNOSIS — Z515 Encounter for palliative care: Secondary | ICD-10-CM | POA: Diagnosis not present

## 2024-07-24 DIAGNOSIS — Z789 Other specified health status: Secondary | ICD-10-CM | POA: Diagnosis not present

## 2024-07-24 DIAGNOSIS — F03918 Unspecified dementia, unspecified severity, with other behavioral disturbance: Secondary | ICD-10-CM | POA: Diagnosis not present

## 2024-07-24 DIAGNOSIS — Z66 Do not resuscitate: Secondary | ICD-10-CM | POA: Diagnosis not present

## 2024-07-24 MED ORDER — FLEET ENEMA RE ENEM
1.0000 | ENEMA | Freq: Once | RECTAL | Status: AC
  Administered 2024-07-24: 1 via RECTAL
  Filled 2024-07-24: qty 1

## 2024-07-24 MED ORDER — OLANZAPINE 10 MG PO TABS
10.0000 mg | ORAL_TABLET | Freq: Every day | ORAL | Status: DC
Start: 2024-07-24 — End: 2024-08-03
  Administered 2024-07-24 – 2024-08-03 (×11): 10 mg via ORAL
  Filled 2024-07-24 (×11): qty 1

## 2024-07-24 NOTE — Progress Notes (Signed)
 PROGRESS NOTE    [name]  FMW:[id number] DOB: 11/23/1940 DOA: 07/10/2024 PCP: Debrah Josette ORN., PA-C   Brief Narrative:  This 83 yrs old Female with h/o progressive dementia with behavioral disturbance, TIA, HTN, HLD, depression, left lumbar radiculopathy status post decompression, chronic gait abnormality presented to the ED due to worsening confusion more than baseline with aggression / increased irritability for about 1 week. History of frequent falls from unsteady gait, recently about 4 days PTA. Patient follows with Neurology at [facility] and behavioral health, plans for patient to be referred to geriatric psychiatry was initiated. In the ED, Patient was hemodynamically stable, labs, UA, unremarkable. CT head /cervical spine with no acute intracranial abnormality, CT abdomen/pelvis with no acute findings, except for 12 mm nonobstructive renal calculus on the left, No hydronephrosis, large hiatal hernia. Daughter was concerned about UTI and also reported being unable to care for patient at home. Psychiatry consulted > increased Seroquel , added Haldol  as needed.  Assessment & Plan:   Principal Problem:   Dementia with behavioral disturbance (HCC) Active Problems:   Altered mental status   Hyperlipidemia   Left foot drop   Gait abnormality   History of dementia   History of TIA (transient ischemic attack)  Dementia with worsening behavioral disturbance: Increased agitation: Patient has been working with outpatient behavioral health, Neurology and was referred to geriatric neuropsychiatry. Inpatient psychiatric previously consulted, recommended adding Seroquel  75 mg at bedtime, continuing Zoloft  200 mg daily, continue trazodone 100 mg at bedtime as needed, Geodon 20 mg IM every 6 hours as needed for severe agitation as well as Haldol .  Still restless and confused. Palliative Care following.  Plan for zyprexa 2.5mg  added to AM dose with bedtime dose increased to 10mg .    History of left-sided foot drop: History of unsteady gait: History of frequent falls: CT head, CT cervical spine, CT abdomen pelvis unremarkable. Reports some chronic back pain. Scheduled Tylenol  x 2 days. Continue fall precaution PT/OT Evaluation > SNF   Essential hypertension: Continue Imdur .   Hyperlipidemia: Continue Zocor .   History of TIA: Continue aspirin  and Zocor .  DVT prophylaxis: Lovenox  Code Status: DNR Family Communication: Daughter at bedside Disposition Plan:   Status is: Inpatient Remains inpatient appropriate because: Severity of illness.   Consultants:  Psychiatry  Procedures: None Antimicrobials: None  Subjective: Patient was seen and examined at bedside. Overnight events noted. Patient seems improved and was sitting in the recliner, denies overnight events. Patient has difficult to manage agitation. She does have intermittent agitation spells.  Objective: Vitals:   07/23/24 2043 07/24/24 0625 07/24/24 0800 07/24/24 1203  BP: (!) 143/82 (!) 156/75 128/66 102/71  Pulse: 74 67 (!) 59 85  Resp: 18 18 18 18   Temp: 97.7 F (36.5 C) 97.6 F (36.4 C) 97.7 F (36.5 C) 98.2 F (36.8 C)  TempSrc: Oral Oral Oral Oral  SpO2: 97% 96% 100% (!) 82%  Weight:      Height:        Intake/Output Summary (Last 24 hours) at 07/24/2024 1334 Last data filed at 07/24/2024 0931 Gross per 24 hour  Intake 218 ml  Output --  Net 218 ml    Filed Weights   07/11/24 1152  Weight: 53.5 kg    Examination:  General exam: Appears calm and comfortable, not in any acute distress. Respiratory system: CTA Bilaterally . Respiratory effort normal.  RR 15 Cardiovascular system: S1 & S2 heard, RRR. No JVD, murmurs, rubs, gallops or clicks. Gastrointestinal system: Abdomen  is non distended, soft and non tender.  Normal bowel sounds heard. Central nervous system: Alert and oriented x 2. No focal neurological deficits. Extremities: No edema, no cyanosis, no  clubbing. Skin: No rashes, lesions or ulcers Psychiatry: Judgement and insight appear normal. Mood & affect appropriate.   Data Reviewed: I have personally reviewed following labs and imaging studies  CBC: Recent Labs  Lab 07/23/24 0502  WBC 5.7  HGB 11.1*  HCT 33.2*  MCV 97.6  PLT 214    Basic Metabolic Panel: Recent Labs  Lab 07/23/24 0502  NA 139  K 3.8  CL 101  CO2 29  GLUCOSE 95  BUN 29*  CREATININE 0.88  CALCIUM  9.2  MG 2.2  PHOS 4.2    GFR: Estimated Creatinine Clearance: 41.6 mL/min (by C-G formula based on SCr of 0.88 mg/dL). Liver Function Tests: No results for input(s): AST, ALT, ALKPHOS, BILITOT, PROT, ALBUMIN in the last 168 hours. No results for input(s): LIPASE, AMYLASE in the last 168 hours. No results for input(s): AMMONIA in the last 168 hours. Coagulation Profile: No results for input(s): INR, PROTIME in the last 168 hours. Cardiac Enzymes: No results for input(s): CKTOTAL, CKMB, CKMBINDEX, TROPONINI in the last 168 hours. BNP (last 3 results) No results for input(s): PROBNP in the last 8760 hours. HbA1C: No results for input(s): HGBA1C in the last 72 hours. CBG: No results for input(s): GLUCAP in the last 168 hours. Lipid Profile: No results for input(s): CHOL, HDL, LDLCALC, TRIG, CHOLHDL, LDLDIRECT in the last 72 hours. Thyroid Function Tests: No results for input(s): TSH, T4TOTAL, FREET4, T3FREE, THYROIDAB in the last 72 hours. Anemia Panel: No results for input(s): VITAMINB12, FOLATE, FERRITIN, TIBC, IRON, RETICCTPCT in the last 72 hours. Sepsis Labs: No results for input(s): PROCALCITON, LATICACIDVEN in the last 168 hours.  No results found for this or any previous visit (from the past 240 hours).  Radiology Studies: No results found.  Scheduled Meds:  aspirin   81 mg Oral Daily   enoxaparin  (LOVENOX ) injection  40 mg Subcutaneous Q24H   feeding supplement   237 mL Oral BID BM   isosorbide  mononitrate  60 mg Oral Daily   levothyroxine   75 mcg Oral Q0600   lidocaine   1 patch Transdermal Q24H   OLANZapine zydis  10 mg Oral QHS   Or   OLANZapine  10 mg Intramuscular QHS   OLANZapine  10 mg Oral Daily   sertraline   200 mg Oral Daily   simvastatin   40 mg Oral QHS   sodium chloride  flush  3 mL Intravenous Q12H   sodium chloride  flush  3 mL Intravenous Q12H   traZODone  100 mg Oral Q2000   Continuous Infusions:   LOS: 12 days    Time spent: 35 mins    Darcel Dawley, MD [facility]   If 7PM-7AM, please contact night-coverage

## 2024-07-24 NOTE — Progress Notes (Signed)
 Mobility Specialist: Progress Note   07/24/24 0800  Therapy Vitals  Temp 97.7 F (36.5 C)  Temp Source Oral  Pulse Rate (!) 59  Resp 18  BP 128/66  Patient Position (if appropriate) Sitting  Oxygen  Therapy  SpO2 100 %  O2 Device Room Air  Mobility  Activity Ambulated with assistance  Level of Assistance Contact guard assist, steadying assist  Assistive Device Front wheel walker  Distance Ambulated (ft) 300 ft  Activity Response Tolerated well  Mobility Referral Yes  Mobility visit 1 Mobility  Mobility Specialist Start Time (ACUTE ONLY) 0902  Mobility Specialist Stop Time (ACUTE ONLY) 0914  Mobility Specialist Time Calculation (min) (ACUTE ONLY) 12 min    Pt received in chair, pleasantly confused and agreeable to mobility session. CGA throughout. No complaints. Mod verbal cues for forward gaze and to take bigger steps. Returned to room. Left in chair with all needs met, call bell in reach. Sitter present.   Ileana Lute Mobility Specialist Please contact via SecureChat or Rehab office at [phone]

## 2024-07-24 NOTE — Plan of Care (Signed)

## 2024-07-24 NOTE — Progress Notes (Signed)
 Daily Progress Note   Patient Name: [name] Qmpiioz       Date: 07/24/2024 DOB: Dec 27, 1940  Age: 83 y.o. MRN#: [id number] Attending Physician: Leotis Bogus, MD Primary Care Physician: Debrah Josette ORN., PA-C Admit Date: 07/10/2024  Reason for Consultation/Follow-up: Non pain symptom management  Subjective: I have reviewed medical records including EPIC notes, MAR, and labs. Noted PRN's given overnight: geodon x1, haldol  x1, acetaminophen  x1, zofran  x1. Increased dose of Zyprexa started this morning. Received report from primary RN - no acute concerns. RN reports patient has been pleasant today, walking the halls, not agitated.   Went to visit patient at bedside - no family/visitors present. Patient was sitting up in chair - she remains confused but is calm. No signs or non-verbal gestures of pain or discomfort noted. No respiratory distress, increased work of breathing, or secretions noted.    Length of Stay: 12  Current Medications: Scheduled Meds:   aspirin   81 mg Oral Daily   enoxaparin  (LOVENOX ) injection  40 mg Subcutaneous Q24H   feeding supplement  237 mL Oral BID BM   isosorbide  mononitrate  60 mg Oral Daily   levothyroxine   75 mcg Oral Q0600   lidocaine   1 patch Transdermal Q24H   OLANZapine zydis  10 mg Oral QHS   Or   OLANZapine  10 mg Intramuscular QHS   OLANZapine  10 mg Oral Daily   sertraline   200 mg Oral Daily   simvastatin   40 mg Oral QHS   sodium chloride  flush  3 mL Intravenous Q12H   sodium chloride  flush  3 mL Intravenous Q12H   traZODone  100 mg Oral Q2000    Continuous Infusions:   PRN Meds: acetaminophen , haloperidol  lactate **OR** haloperidol  lactate, ondansetron  **OR** ondansetron  (ZOFRAN ) IV, sodium chloride  flush, ziprasidone  Physical  Exam Vitals and nursing note reviewed.  Constitutional:      General: She is not in acute distress. Pulmonary:     Effort: No respiratory distress.  Skin:    General: Skin is warm and dry.  Neurological:     Mental Status: She is disoriented and confused.             Vital Signs: BP 102/71 (BP Location: Right Arm)   Pulse 85   Temp 98.2 F (36.8 C) (Oral)   Resp 18  Ht 5' 4 (1.626 m)   Wt 53.5 kg   SpO2 (!) 82%   BMI 20.25 kg/m  SpO2: SpO2: (!) 82 % O2 Device: O2 Device: Room Air O2 Flow Rate:    Intake/output summary:  Intake/Output Summary (Last 24 hours) at 07/24/2024 1238 Last data filed at 07/24/2024 0931 Gross per 24 hour  Intake 218 ml  Output --  Net 218 ml   LBM: Last BM Date : 07/20/24 Baseline Weight: Weight: 53.5 kg Most recent weight: Weight: 53.5 kg        Palliative Assessment/Data: PPS 70%      Patient Active Problem List   Diagnosis Date Noted   History of dementia 07/10/2024   History of TIA (transient ischemic attack) 07/10/2024   Delirium 12/21/2023   Altered mental status 06/30/2022   Aggressive behavior 01/27/2021   Dementia with behavioral disturbance (HCC) 01/14/2021   Memory loss 09/17/2020   Small vessel disease, cerebrovascular 07/20/2018   Left foot drop 05/18/2018   TIA (transient ischemic attack) 05/18/2018   Gait abnormality 05/18/2018   S/P left THA, AA 07/17/2014   Hypertension    Hyperlipidemia    Coronary artery disease    Spinal stenosis     Palliative Care Assessment & Plan   Patient Profile: 83 y.o. female with past medical history of dementia with behavioral disturbance, TIA, essential HTN, HLD, depression, left lumbar radiculopathy s/p decompression, left foot drop admitted on 07/10/2024 with concern for UTI (UA negative) and worsening agitation and dementia. Required Geodon in ED. Ongoing fluctuation of symptoms that are very unpredictable and difficult to manage.   Assessment: Principal Problem:    Dementia with behavioral disturbance (HCC) Active Problems:   Hyperlipidemia   Left foot drop   Gait abnormality   Altered mental status   History of dementia   History of TIA (transient ischemic attack)   Recommendations/Plan: Continue to treat the treatable and avoid escalation of care to ICU Continue DNR-Limited as previously documented - durable DNR form completed and placed in shadow chart. Copy was made and will be scanned into Vynca/ACP tab First dose of 10mg  Zyprexa started this morning - will allow time for therapeutic effect prior to any adjustments Ongoing GOC pending clinical course Continue symptom management as noted below PMT will continue to follow and support holistically  Symptom Management Trazodone scheduled at bedtime Zyprexa 10mg  BID Haldol  PRN agitation Geodon PRN agitation   Goals of Care and Additional Recommendations: Limitations on Scope of Treatment: No Tracheostomy  Code Status:    Code Status Orders  (From admission, onward)           Start     Ordered   07/14/24 1236  Do not attempt resuscitation (DNR)- Limited -Do Not Intubate (DNI)  (Code Status)  Continuous       Question Answer Comment  If pulseless and not breathing No CPR or chest compressions.   In Pre-Arrest Conditions (Patient Is Breathing and Has A Pulse) Do not intubate. Provide all appropriate non-invasive medical interventions. Avoid ICU transfer unless indicated or required.   Consent: Discussion documented in EHR or advanced directives reviewed      07/14/24 1236           Code Status History     Date Active Date Inactive Code Status Order ID Comments User Context   07/10/2024 2342 07/14/2024 1236 Full Code [id number]  Lee Kingfisher, MD ED   12/21/2023 2241 12/26/2023 1735 Full Code [id number]  Arthea Child, MD ED  06/30/2022 1945 07/02/2022 1656 Full Code [id number]  Raenelle Coria, MD Inpatient   06/29/2022 1905 06/30/2022 1945 Full Code [id number]  Ellouise Richerd POUR, DO ED   01/25/2021 1746 01/27/2021 1930 Full Code [id number]  Emil Share, DO ED   07/17/2014 1634 07/20/2014 1532 Full Code [id number]  Babish, Donnice Hamilton, PA-C Inpatient      Advance Directive Documentation    Flowsheet Row Most Recent Value  Type of Advance Directive Healthcare Power of Attorney  [[name] Cummings]  Pre-existing out of facility DNR order (yellow form or pink MOST form) --  MOST Form in Place? --    Prognosis:  Unable to determine  Discharge Planning: To Be Determined  Care plan was discussed with primary RN, attending  Thank you for allowing the Palliative Medicine Team to assist in the care of this patient.   Total Time 25 minutes Prolonged Time Billed  no       Jeoffrey CHRISTELLA Sharps, NP  Please contact Palliative Medicine Team phone at [phone] for questions and concerns.   *Portions of this note are a verbal dictation therefore any spelling and/or grammatical errors are due to the Dragon Medical One system interpretation.

## 2024-07-24 NOTE — TOC Progression Note (Signed)
 Transition of Care [facility]) - Progression Note    Patient Details  Name: [name] MRN: [id number] Date of Birth: 11/20/1940  [facility] of [facility]) CM/SW Contact  Sherline Clack, [place] Phone Number: 07/24/2024, 4:16 PM  Clinical Narrative:     Patient was assigned PASSR: [id number] H. CSW awaiting bed offers from SNFs and memory care facilities. CSW will continue to monitor bed availability and update discharge plan.   Expected Discharge Plan: Skilled Nursing Facility                 Expected Discharge Plan and Services                                               Social Drivers of Health (SDOH) Interventions SDOH Screenings   Food Insecurity: Patient Unable To Answer (07/11/2024)  Housing: Patient Unable To Answer (07/11/2024)  Transportation Needs: Patient Unable To Answer (07/11/2024)  Utilities: Patient Unable To Answer (07/11/2024)  Social Connections: Unknown (07/11/2024)  Tobacco Use: Low Risk  (07/10/2024)    Readmission Risk Interventions     No data to display

## 2024-07-25 ENCOUNTER — Telehealth: Admitting: Neurology

## 2024-07-25 DIAGNOSIS — F03918 Unspecified dementia, unspecified severity, with other behavioral disturbance: Secondary | ICD-10-CM | POA: Diagnosis not present

## 2024-07-25 DIAGNOSIS — Z515 Encounter for palliative care: Secondary | ICD-10-CM | POA: Diagnosis not present

## 2024-07-25 DIAGNOSIS — R451 Restlessness and agitation: Secondary | ICD-10-CM | POA: Diagnosis not present

## 2024-07-25 MED ORDER — OXYCODONE HCL 5 MG PO TABS
2.5000 mg | ORAL_TABLET | ORAL | Status: DC | PRN
  Administered 2024-07-25 – 2024-08-03 (×15): 2.5 mg via ORAL
  Filled 2024-07-25 (×17): qty 1

## 2024-07-25 MED ORDER — ACETAMINOPHEN 325 MG PO TABS
650.0000 mg | ORAL_TABLET | Freq: Three times a day (TID) | ORAL | Status: DC
  Administered 2024-07-25 – 2024-07-27 (×6): 650 mg via ORAL
  Filled 2024-07-25 (×5): qty 2

## 2024-07-25 NOTE — Progress Notes (Signed)
 Palliative:  HPI: 83 y.o. female with past medical history of dementia with behavioral disturbance, TIA, essential HTN, HLD, depression, left lumbar radiculopathy s/p decompression, left foot drop admitted on 07/10/2024 with concern for UTI (UA negative) and worsening agitation and dementia. Required Geodon in ED. Ongoing fluctuation of symptoms that are very unpredictable and difficult to manage.   I met today at [name]'s bedside. I discussed with RN and NT at bedside. They report that [name] has been very active and wanting to walk. They have walked the halls this morning. NT does report that [name] appears to get tired, a little winded, and complains of back pain after much walking. They put her back in bed and she has been restless since in bed. We got her up to sit in recliner. RN to provide acetaminophen  - I will schedule 3x daily as it seems she is getting this more frequently. She does have a history of chronic back pain and change in activity while hospitalized could be contributing to her agitation at times. I will also add PRN OxyIR 2.5 mg that we can try if she is becoming more agitated to see if this could be pain related. RN agrees with plan. RN does report that [name] has seemed less combative even when she has increased agitation and seems we are making some progress in her symptom management.   No family at bedside. Will reach out tomorrow.   Exam: Alert, baseline confusion. Thin, frail. Agitated in bed but not combative. Breathing regular, unlabored. Abd soft. Moves all extremities.    Plan: - DNR/DNI - Avoid escalation of care to ICU or invasive measures - Consider search for memory care placement - Agitation:  - Zyprexa 10 mg in the am and at bedtime  - Continue trazodone 100 mg at nighttime   - Continue Zoloft  200 mg daily  - Added Acetaminophen  650 mg q8h scheduled  - Added OxyIR 2.5 mg q4h PRN pain (she may have difficulty expressing pain given underlying  dementia)  - Recommend out of bed to recliner or even wheelchair as she seems more agitated in bed  - Could consider depakote sprinkles (evidence is mixed on efficacy)  40 min  [name], NP Palliative Medicine Team Pager [phone] (Please see [email or web address] for schedule) Team Phone [phone]

## 2024-07-25 NOTE — Progress Notes (Signed)
 Occupational Therapy Treatment Patient Details Name: [name] MRN: [id number] DOB: 05-Mar-1941 Today's Date: 07/25/2024   History of present illness Pt is an 83 y.o. female presented 07/10/24 due to UTI symptoms and unsteadiness with repeated falls over the last few days. UA -PMH significant for CAD, HTN, dementia with behavioral disturbances, frequent UTIs, TIA, L  lumbar radiculopathy status post decompression of the left foot drop   OT comments  Pt presented attempting to get OOB even though sitter reported they have gotten OOB 3x for a walk already. Pt at this time with simple one step cues to complete hair care at sink with close CGA and RW. She then agreed for a short walk with RW and CGA to min assist as had pt navigate through narrow areas. She then was set up for lunch at EOB with removal of other items to decrease in distractions. Pt was very pleasant throughout the session. Patient will benefit from continued inpatient follow up therapy, <3 hours/day.       If plan is discharge home, recommend the following:  A little help with walking and/or transfers;A little help with bathing/dressing/bathroom;Assistance with cooking/housework;Assistance with feeding;Direct supervision/assist for medications management;Direct supervision/assist for financial management;Assist for transportation;Help with stairs or ramp for entrance;Supervision due to cognitive status   Equipment Recommendations  Wheelchair (measurements OT)    Recommendations for Other Services      Precautions / Restrictions Precautions Precautions: Fall Recall of Precautions/Restrictions: Impaired Precaution/Restrictions Comments: frequent falls in days before hospitalization Restrictions Weight Bearing Restrictions Per Provider Order: No       Mobility Bed Mobility Overal bed mobility: Modified Independent Bed Mobility: Supine to Sit     Supine to sit: Modified independent (Device/Increase time)      General bed mobility comments: pt attempting to get OOB when entering the room    Transfers Overall transfer level: Needs assistance Equipment used: Rolling walker (2 wheels), 1 person hand held assist Transfers: Sit to/from Stand Sit to Stand: Contact guard assist                 Balance Overall balance assessment: Needs assistance Sitting-balance support: Feet supported Sitting balance-Leahy Scale: Good     Standing balance support: Single extremity supported, Bilateral upper extremity supported Standing balance-Leahy Scale: Fair                             ADL either performed or assessed with clinical judgement   ADL Overall ADL's : Needs assistance/impaired Eating/Feeding: Set up;Sitting   Grooming: Brushing hair;Minimal assistance Grooming Details (indicate cue type and reason): standing at sink but needs min assist at posterio area Upper Body Bathing: Contact guard assist;Minimal assistance;Sitting       Upper Body Dressing : Contact guard assist;Minimal assistance;Sitting       Toilet Transfer: Contact guard assist;Cueing for safety;Cueing for sequencing           Functional mobility during ADLs: Contact guard assist;Rolling walker (2 wheels)      Extremity/Trunk Assessment Upper Extremity Assessment Upper Extremity Assessment: Generalized weakness   Lower Extremity Assessment Lower Extremity Assessment: Defer to PT evaluation        Vision       Perception     Praxis     Communication Communication Communication: No apparent difficulties Factors Affecting Communication: Reduced clarity of speech   Cognition Arousal: Alert Behavior During Therapy: Impulsive, Restless Cognition: History of cognitive impairments  OT - Cognition Comments: pleasant,  often distracted in enviroment                 Following commands: Impaired Following commands impaired: Follows one step commands inconsistently       Cueing   Cueing Techniques: Verbal cues, Gestural cues  Exercises      Shoulder Instructions       General Comments      Pertinent Vitals/ Pain       Pain Assessment Pain Assessment: No/denies pain Pain Score: 0-No pain  Home Living                                          Prior Functioning/Environment              Frequency  Min 2X/week        Progress Toward Goals  OT Goals(current goals can now be found in the care plan section)  Progress towards OT goals: Progressing toward goals  Acute Rehab OT Goals Patient Stated Goal: none OT Goal Formulation: Patient unable to participate in goal setting Time For Goal Achievement: 08/08/24 Potential to Achieve Goals: Fair ADL Goals Pt Will Perform Upper Body Bathing: with supervision;sitting Pt Will Perform Lower Body Bathing: with contact guard assist;sit to/from stand Pt Will Perform Upper Body Dressing: with supervision;sitting Pt Will Perform Lower Body Dressing: with contact guard assist;sit to/from stand Pt Will Transfer to Toilet: with supervision;ambulating;regular height toilet  Plan      Co-evaluation                 AM-PAC OT 6 Clicks Daily Activity     Outcome Measure   Help from another person eating meals?: A Little Help from another person taking care of personal grooming?: A Little Help from another person toileting, which includes using toliet, bedpan, or urinal?: A Little Help from another person bathing (including washing, rinsing, drying)?: A Little Help from another person to put on and taking off regular upper body clothing?: A Little Help from another person to put on and taking off regular lower body clothing?: A Little 6 Click Score: 18    End of Session Equipment Utilized During Treatment: Gait belt;Rolling walker (2 wheels)  OT Visit Diagnosis: Unsteadiness on feet (R26.81);Other abnormalities of gait and mobility (R26.89);Repeated falls (R29.6);Muscle  weakness (generalized) (M62.81)   Activity Tolerance Patient tolerated treatment well   Patient Left in bed;with call bell/phone within reach (sitter in room)   Nurse Communication Mobility status        Time: 8851-8792 OT Time Calculation (min): 19 min  Charges: OT General Charges $OT Visit: 1 Visit OT Treatments $Self Care/Home Management : 8-22 mins  Warrick POUR OTR/L  Acute Rehab Services  [phone] office number   Warrick Berber 07/25/2024, 12:33 PM

## 2024-07-25 NOTE — Progress Notes (Addendum)
 Physical Therapy Progress Note Patient Details Name: [name] MRN: [id number] DOB: October 24, 1940 Today's Date: 07/25/2024   History of Present Illness Pt is an 83 y.o. female presented 07/10/24 due to UTI symptoms and unsteadiness with repeated falls over the last few days. UA -PMH significant for CAD, HTN, dementia with behavioral disturbances, frequent UTIs, TIA, L  lumbar radiculopathy status post decompression of the left foot drop    PT Comments  Pt currently presenting at supervision to [facility] for all functional activities including gait, sit to stand. Pt with very unsafe sit to stand despite practicing with 10x sit to stand to RW; had to remove RW to prevent pt placing hands in various places on RW. Pt currently at a very high risk for falls with very poor safety awareness. Significant impairment in balance that gets worse with dual tasks including looking for room numbers, taking larger steps or trying to stay out of the gray tiles on the floor requiring up to Min A. Pt following direction 25% of the time to improve gait pattern. Due to pt current functional status, home set up and available assistance at home recommending skilled physical therapy services < 3 hours/day in order to address strength, balance and functional mobility to decrease risk for falls, injury, immobility, skin break down and re-hospitalization.      If plan is discharge home, recommend the following: A little help with walking and/or transfers;Assistance with cooking/housework;Assist for transportation;Help with stairs or ramp for entrance;Supervision due to cognitive status   Can travel by private vehicle     No  Equipment Recommendations  None recommended by PT       Precautions / Restrictions Precautions Precautions: Fall Recall of Precautions/Restrictions: Impaired Precaution/Restrictions Comments: frequent falls in days before hospitalization Restrictions Weight Bearing Restrictions Per Provider Order: No      Mobility  Bed Mobility Overal bed mobility: Needs Assistance Bed Mobility: Sit to Supine       Sit to supine: Supervision   General bed mobility comments: pt lays on the very edge of the bed had to elevate rail due to pt about to roll off EOB    Transfers Overall transfer level: Needs assistance Equipment used: Rolling walker (2 wheels), 1 person hand held assist Transfers: Sit to/from Stand Sit to Stand: Contact guard assist           General transfer comment: CGA 10x sit to stand from EOB working on safe hand placement when standing/sitting; improved when RW was moved due to pt trying to grab anywhere on the RW.    Ambulation/Gait Ambulation/Gait assistance: Contact guard assist, Min assist Gait Distance (Feet): 400 Feet Assistive device: Rolling walker (2 wheels) Gait Pattern/deviations: Step-through pattern, Decreased step length - right, Decreased step length - left, Narrow base of support, Shuffle Gait velocity: decreased Gait velocity interpretation: <1.31 ft/sec, indicative of household ambulator   General Gait Details: Pt with short steps, improves with heavy multi modal cueing for larger steps. after 350 ft pt reports fatigue. Min A with dual tasking     Balance Overall balance assessment: Needs assistance Sitting-balance support: Feet supported Sitting balance-Leahy Scale: Good     Standing balance support: Bilateral upper extremity supported, Reliant on assistive device for balance, During functional activity, Single extremity supported Standing balance-Leahy Scale: Fair      Hotel Manager: No apparent difficulties Factors Affecting Communication: Reduced clarity of speech  Cognition Arousal: Alert Behavior During Therapy: Impulsive, Restless   PT - Cognitive impairments: History  of cognitive impairments     PT - Cognition Comments: follows directions 25% of the time or less Following commands: Impaired Following  commands impaired: Follows one step commands inconsistently    Cueing Cueing Techniques: Verbal cues, Gestural cues         Pertinent Vitals/Pain Pain Assessment Pain Assessment: Faces Faces Pain Scale: No hurt Breathing: normal Negative Vocalization: none Facial Expression: smiling or inexpressive Body Language: relaxed Consolability: no need to console PAINAD Score: 0 Pain Intervention(s): Monitored during session     PT Goals (current goals can now be found in the care plan section) Acute Rehab PT Goals Patient Stated Goal: unable to state PT Goal Formulation: Patient unable to participate in goal setting Time For Goal Achievement: 08/08/24 Potential to Achieve Goals: Fair Progress towards PT goals: Progressing toward goals    Frequency    Min 1X/week      PT Plan  Continue with current POC        AM-PAC PT 6 Clicks Mobility   Outcome Measure  Help needed turning from your back to your side while in a flat bed without using bedrails?: None Help needed moving from lying on your back to sitting on the side of a flat bed without using bedrails?: None Help needed moving to and from a bed to a chair (including a wheelchair)?: A Little Help needed standing up from a chair using your arms (e.g., wheelchair or bedside chair)?: A Little Help needed to walk in hospital room?: A Little Help needed climbing 3-5 steps with a railing? : A Lot 6 Click Score: 19    End of Session Equipment Utilized During Treatment: Gait belt Activity Tolerance: Patient tolerated treatment well Patient left: in bed;with call bell/phone within reach;with nursing/sitter in room Nurse Communication: Mobility status PT Visit Diagnosis: Unsteadiness on feet (R26.81);Other abnormalities of gait and mobility (R26.89);Muscle weakness (generalized) (M62.81);Difficulty in walking, not elsewhere classified (R26.2);Adult, failure to thrive (R62.7);Other symptoms and signs involving the nervous system  (R29.898);Repeated falls (R29.6)     Time: 8677-8664 PT Time Calculation (min) (ACUTE ONLY): 13 min  Charges:    $Therapeutic Activity: 8-22 mins PT General Charges $$ ACUTE PT VISIT: 1 Visit                    [name], DPT, CLT  Acute Rehabilitation Services Office: [phone] (Secure chat preferred)    [name] 07/25/2024, 2:03 PM

## 2024-07-25 NOTE — Progress Notes (Signed)
 PROGRESS NOTE    [name]  FMW:[id number] DOB: May 03, 1941 DOA: 07/10/2024 PCP: Debrah Josette ORN., PA-C   Brief Narrative:  This 83 yrs old Female with h/o progressive dementia with behavioral disturbance, TIA, HTN, HLD, depression, left lumbar radiculopathy status post decompression, chronic gait abnormality presented to the ED due to worsening confusion more than baseline with aggression / increased irritability for about 1 week. History of frequent falls from unsteady gait, recently about 4 days PTA. Patient follows with Neurology at [facility] and behavioral health, plans for patient to be referred to geriatric psychiatry was initiated. In the ED, Patient was hemodynamically stable, labs, UA, unremarkable. CT head /cervical spine with no acute intracranial abnormality, CT abdomen/pelvis with no acute findings, except for 12 mm nonobstructive renal calculus on the left, No hydronephrosis, large hiatal hernia. Daughter was concerned about UTI and also reported being unable to care for patient at home. Psychiatry consulted > increased Seroquel , added Haldol  as needed.  Assessment & Plan:   Principal Problem:   Dementia with behavioral disturbance (HCC) Active Problems:   Altered mental status   Hyperlipidemia   Left foot drop   Gait abnormality   History of dementia   History of TIA (transient ischemic attack)  Dementia with worsening behavioral disturbance: Increased agitation: Patient has been working with outpatient behavioral health, Neurology and was referred to geriatric neuropsychiatry. Inpatient psychiatric previously consulted, recommended adding Seroquel  75 mg at bedtime, continuing Zoloft  200 mg daily, continue trazodone 100 mg at bedtime as needed, Geodon 20 mg IM every 6 hours as needed for severe agitation as well as Haldol .  Still restless and confused. Palliative Care following.  Plan for zyprexa 2.5mg  added to AM dose with bedtime dose increased to 10mg .    History of left-sided foot drop: History of unsteady gait: History of frequent falls: CT head, CT cervical spine, CT abdomen pelvis unremarkable. Reports some chronic back pain. Scheduled Tylenol  x 2 days. Continue fall precaution PT/OT Evaluation > SNF   Essential hypertension: Continue Imdur .   Hyperlipidemia: Continue Zocor .   History of TIA: Continue aspirin  and Zocor .  DVT prophylaxis: Lovenox  Code Status: DNR Family Communication: Daughter at bedside Disposition Plan:   Status is: Inpatient Remains inpatient appropriate because: Severity of illness.   Consultants:  Psychiatry  Procedures: None Antimicrobials: None  Subjective: Patient was seen and examined at bedside. Overnight events noted. Patient seems improved and was sitting in the recliner, denies overnight events. Patient has difficult to manage agitation. She does have intermittent agitation spells.  Objective: Vitals:   07/24/24 2028 07/24/24 2029 07/25/24 0515 07/25/24 1136  BP:  (!) 101/53 (!) 133/58 116/89  Pulse:  65  80  Resp:  17 17   Temp: (!) 97.3 F (36.3 C)  97.7 F (36.5 C) (!) 97.5 F (36.4 C)  TempSrc: Oral     SpO2:  95% 92% 97%  Weight:      Height:        Intake/Output Summary (Last 24 hours) at 07/25/2024 1152 Last data filed at 07/25/2024 0900 Gross per 24 hour  Intake 460 ml  Output --  Net 460 ml    Filed Weights   07/11/24 1152  Weight: 53.5 kg    Examination:  General exam: Appears calm and comfortable, not in any acute distress. Respiratory system: CTA Bilaterally . Respiratory effort normal.  RR 13 Cardiovascular system: S1 & S2 heard, RRR. No JVD, murmurs, rubs, gallops or clicks. Gastrointestinal system: Abdomen is non distended,  soft and non tender.  Normal bowel sounds heard. Central nervous system: Alert and oriented x 2. No focal neurological deficits. Extremities: No edema, no cyanosis, no clubbing. Skin: No rashes, lesions or ulcers Psychiatry:  Judgement and insight appear normal. Mood & affect appropriate.   Data Reviewed: I have personally reviewed following labs and imaging studies  CBC: Recent Labs  Lab 07/23/24 0502  WBC 5.7  HGB 11.1*  HCT 33.2*  MCV 97.6  PLT 214    Basic Metabolic Panel: Recent Labs  Lab 07/23/24 0502  NA 139  K 3.8  CL 101  CO2 29  GLUCOSE 95  BUN 29*  CREATININE 0.88  CALCIUM  9.2  MG 2.2  PHOS 4.2    GFR: Estimated Creatinine Clearance: 41.6 mL/min (by C-G formula based on SCr of 0.88 mg/dL). Liver Function Tests: No results for input(s): AST, ALT, ALKPHOS, BILITOT, PROT, ALBUMIN in the last 168 hours. No results for input(s): LIPASE, AMYLASE in the last 168 hours. No results for input(s): AMMONIA in the last 168 hours. Coagulation Profile: No results for input(s): INR, PROTIME in the last 168 hours. Cardiac Enzymes: No results for input(s): CKTOTAL, CKMB, CKMBINDEX, TROPONINI in the last 168 hours. BNP (last 3 results) No results for input(s): PROBNP in the last 8760 hours. HbA1C: No results for input(s): HGBA1C in the last 72 hours. CBG: No results for input(s): GLUCAP in the last 168 hours. Lipid Profile: No results for input(s): CHOL, HDL, LDLCALC, TRIG, CHOLHDL, LDLDIRECT in the last 72 hours. Thyroid Function Tests: No results for input(s): TSH, T4TOTAL, FREET4, T3FREE, THYROIDAB in the last 72 hours. Anemia Panel: No results for input(s): VITAMINB12, FOLATE, FERRITIN, TIBC, IRON, RETICCTPCT in the last 72 hours. Sepsis Labs: No results for input(s): PROCALCITON, LATICACIDVEN in the last 168 hours.  No results found for this or any previous visit (from the past 240 hours).  Radiology Studies: No results found.  Scheduled Meds:  aspirin   81 mg Oral Daily   enoxaparin  (LOVENOX ) injection  40 mg Subcutaneous Q24H   feeding supplement  237 mL Oral BID BM   isosorbide  mononitrate  60 mg Oral  Daily   levothyroxine   75 mcg Oral Q0600   lidocaine   1 patch Transdermal Q24H   OLANZapine zydis  10 mg Oral QHS   Or   OLANZapine  10 mg Intramuscular QHS   OLANZapine  10 mg Oral Daily   sertraline   200 mg Oral Daily   simvastatin   40 mg Oral QHS   sodium chloride  flush  3 mL Intravenous Q12H   sodium chloride  flush  3 mL Intravenous Q12H   traZODone  100 mg Oral Q2000   Continuous Infusions:   LOS: 13 days    Time spent: 35 mins    Darcel Dawley, MD [facility]   If 7PM-7AM, please contact night-coverage

## 2024-07-26 DIAGNOSIS — F03918 Unspecified dementia, unspecified severity, with other behavioral disturbance: Secondary | ICD-10-CM | POA: Diagnosis not present

## 2024-07-26 DIAGNOSIS — Z515 Encounter for palliative care: Secondary | ICD-10-CM | POA: Diagnosis not present

## 2024-07-26 DIAGNOSIS — Z7189 Other specified counseling: Secondary | ICD-10-CM | POA: Diagnosis not present

## 2024-07-26 MED ORDER — SENNA 8.6 MG PO TABS
1.0000 | ORAL_TABLET | Freq: Two times a day (BID) | ORAL | Status: DC
  Administered 2024-07-26 – 2024-08-02 (×14): 8.6 mg via ORAL
  Filled 2024-07-26 (×14): qty 1

## 2024-07-26 NOTE — Progress Notes (Signed)
 PROGRESS NOTE    [name]  FMW:[id number] DOB: [birth year] DOA: 07/10/2024 PCP: Debrah Josette ORN., PA-C   Brief Narrative:  This 83 yrs old Female with h/o progressive dementia with behavioral disturbance, TIA, HTN, HLD, depression, left lumbar radiculopathy status post decompression, chronic gait abnormality presented to the ED due to worsening confusion more than baseline with aggression / increased irritability for about 1 week. History of frequent falls from unsteady gait, recently about 4 days PTA. Patient follows with Neurology at [facility] and behavioral health, plans for patient to be referred to geriatric psychiatry was initiated. In the ED, Patient was hemodynamically stable, labs, UA, unremarkable. CT head /cervical spine with no acute intracranial abnormality, CT abdomen/pelvis with no acute findings, except for 12 mm nonobstructive renal calculus on the left, No hydronephrosis, large hiatal hernia. Daughter was concerned about UTI and also reported being unable to care for patient at home. Psychiatry consulted > increased Seroquel , added Haldol  as needed.  Assessment & Plan:   Principal Problem:   Dementia with behavioral disturbance (HCC) Active Problems:   Altered mental status   Hyperlipidemia   Left foot drop   Gait abnormality   History of dementia   History of TIA (transient ischemic attack)  Dementia with worsening behavioral disturbance: Increased agitation: Patient has been working with outpatient behavioral health, Neurology and was referred to geriatric neuropsychiatry. Inpatient psychiatric previously consulted. Continue Zyprexa 10 mg in a.m. and bedtime Continue trazodone 100 mg at bedtime. Continue Zoloft  200 mg daily. Continue Oxy IR 2.5 mg every 4 as needed for pain Continue Geodon 20 mg IM every 6 hours as needed for severe agitation and as Haldol  Consider Depakote sprinkles.  History of left-sided foot drop: History of unsteady gait: History  of frequent falls: CT head, CT cervical spine, CT abdomen pelvis unremarkable. Reports some chronic back pain. Scheduled Tylenol  x 2 days. Continue fall precaution PT/OT Evaluation > SNF   Essential hypertension: Continue Imdur .   Hyperlipidemia: Continue Zocor .   History of TIA: Continue aspirin  and Zocor .  DVT prophylaxis: Lovenox  Code Status: DNR Family Communication: Daughter at bedside Disposition Plan:   Status is: Inpatient Remains inpatient appropriate because: Severity of illness.   Consultants:  Psychiatry  Procedures: None Antimicrobials: None  Subjective: Patient was seen and examined at bedside. Overnight events noted. Patient seems improved and was sitting in the recliner, denies overnight events. Patient has difficult to manage agitation. She does have intermittent agitation spells.  Objective: Vitals:   07/25/24 1935 07/25/24 2057 07/26/24 0646 07/26/24 0824  BP: (!) 145/81 (!) 118/104 (!) 126/92 (!) 162/83  Pulse: 86 86 (!) 59 73  Resp: 17  17   Temp: 97.8 F (36.6 C)   97.9 F (36.6 C)  TempSrc: Oral     SpO2: 96% 96% 96% 96%  Weight:      Height:        Intake/Output Summary (Last 24 hours) at 07/26/2024 1101 Last data filed at 07/26/2024 0400 Gross per 24 hour  Intake 340 ml  Output --  Net 340 ml    Filed Weights   07/11/24 1152  Weight: 53.5 kg    Examination:  General exam: Appears calm and comfortable, not in any acute distress. Respiratory system: CTA Bilaterally . Respiratory effort normal.  RR 14 Cardiovascular system: S1 & S2 heard, RRR. No JVD, murmurs, rubs, gallops or clicks. Gastrointestinal system: Abdomen is non distended, soft and non tender.  Normal bowel sounds heard. Central nervous system: Alert  and oriented x 2. No focal neurological deficits. Extremities: No edema, no cyanosis, no clubbing. Skin: No rashes, lesions or ulcers Psychiatry: Judgement and insight appear normal. Mood & affect appropriate.    Data Reviewed: I have personally reviewed following labs and imaging studies  CBC: Recent Labs  Lab 07/23/24 0502  WBC 5.7  HGB 11.1*  HCT 33.2*  MCV 97.6  PLT 214    Basic Metabolic Panel: Recent Labs  Lab 07/23/24 0502  NA 139  K 3.8  CL 101  CO2 29  GLUCOSE 95  BUN 29*  CREATININE 0.88  CALCIUM  9.2  MG 2.2  PHOS 4.2    GFR: Estimated Creatinine Clearance: 41.6 mL/min (by C-G formula based on SCr of 0.88 mg/dL). Liver Function Tests: No results for input(s): AST, ALT, ALKPHOS, BILITOT, PROT, ALBUMIN in the last 168 hours. No results for input(s): LIPASE, AMYLASE in the last 168 hours. No results for input(s): AMMONIA in the last 168 hours. Coagulation Profile: No results for input(s): INR, PROTIME in the last 168 hours. Cardiac Enzymes: No results for input(s): CKTOTAL, CKMB, CKMBINDEX, TROPONINI in the last 168 hours. BNP (last 3 results) No results for input(s): PROBNP in the last 8760 hours. HbA1C: No results for input(s): HGBA1C in the last 72 hours. CBG: No results for input(s): GLUCAP in the last 168 hours. Lipid Profile: No results for input(s): CHOL, HDL, LDLCALC, TRIG, CHOLHDL, LDLDIRECT in the last 72 hours. Thyroid Function Tests: No results for input(s): TSH, T4TOTAL, FREET4, T3FREE, THYROIDAB in the last 72 hours. Anemia Panel: No results for input(s): VITAMINB12, FOLATE, FERRITIN, TIBC, IRON, RETICCTPCT in the last 72 hours. Sepsis Labs: No results for input(s): PROCALCITON, LATICACIDVEN in the last 168 hours.  No results found for this or any previous visit (from the past 240 hours).  Radiology Studies: No results found.  Scheduled Meds:  acetaminophen   650 mg Oral Q8H   aspirin   81 mg Oral Daily   enoxaparin  (LOVENOX ) injection  40 mg Subcutaneous Q24H   feeding supplement  237 mL Oral BID BM   isosorbide  mononitrate  60 mg Oral Daily   levothyroxine   75 mcg  Oral Q0600   lidocaine   1 patch Transdermal Q24H   OLANZapine zydis  10 mg Oral QHS   Or   OLANZapine  10 mg Intramuscular QHS   OLANZapine  10 mg Oral Daily   sertraline   200 mg Oral Daily   simvastatin   40 mg Oral QHS   sodium chloride  flush  3 mL Intravenous Q12H   sodium chloride  flush  3 mL Intravenous Q12H   traZODone  100 mg Oral Q2000   Continuous Infusions:   LOS: 14 days    Time spent: 35 mins    Darcel Dawley, MD [facility]   If 7PM-7AM, please contact night-coverage

## 2024-07-26 NOTE — Progress Notes (Signed)
 Palliative:  HPI: 83 y.o. female with past medical history of dementia with behavioral disturbance, TIA, essential HTN, HLD, depression, left lumbar radiculopathy s/p decompression, left foot drop admitted on 07/10/2024 with concern for UTI (UA negative) and worsening agitation and dementia. Required Geodon in ED. Ongoing fluctuation of symptoms that are very unpredictable and difficult to manage.   I met today at Ms. Seher's bedside. Daughter, [name], is at bedside. Ms. Elenbaas is sitting in bed. She is not currently trying to get up and is fixated on washcloths and paper folding them repeatedly together. I discussed with [name] her mother's condition and progress. [name] agrees that her mother has not been as combative but still with agitation. [name] shares that she and her sister care for her mother along with one other caregiver that helps but mostly [name] and [name]. Returning home is not an option. I discussed further with [name] about symptom management and goal to keep her mother happy and comfortable. They are hopeful for placement in memory care unit. We discussed expectations and overall poor prognosis. We discussed that her mother is high risk for fall, UTI, or other injury/infection that could quickly escalate her to end of life. We discussed consideration of how to manage if her condition worsened. I recommended consideration of focus on comfort instead of treatment given overall poor quality of life and poor prognosis. [name] ponders this and ultimately agrees this is likely best for her mother. I recommended that we consider hospice to follow once placement is located. [name] is open to this and will discuss further with her sister.   I called [name] to follow up and offer ongoing support but no answer.   Plan: - DNR/DNI - Avoid escalation of care to ICU or invasive measures - Consider search for memory care placement - Agitation:             - Zyprexa 10 mg in the am and at  bedtime             - Continue trazodone 100 mg at nighttime              - Continue Zoloft  200 mg daily             - Added Acetaminophen  650 mg q8h scheduled             - Added OxyIR 2.5 mg q4h PRN pain (she may have difficulty expressing pain given underlying dementia)             - Recommend out of bed to recliner or even wheelchair as she seems more agitated in bed             - Could consider depakote sprinkles (evidence is mixed on efficacy)   55 min  [name], NP Palliative Medicine Team Pager [phone] (Please see [email or web address] for schedule) Team Phone [phone]

## 2024-07-26 NOTE — TOC Progression Note (Signed)
 Transition of Care [facility]) - Progression Note    Patient Details  Name: [name] MRN: [id number] Date of Birth: 10/13/1940  [facility] CM/SW Contact  Rosaline JONELLE Joe, RN Phone Number: 07/26/2024, 3:25 PM  Clinical Narrative:    CM spoke with Bernarda, NP with Palliative Care and patient will need Memory Care placement since the patient's sisters are unable to provide continued care in the home.  The patient's daughter was provided with resource for Memory care placement assistance through [facility].  The daughter is the legal guardian - paperwork was placed on the chart by the MSW.  The daughter gave permission to have FL2 and facesheet sent to [facility] to reach out to the daughter to assist with Memory Placement.  MSW with IP Care management team will continue to assist with communication with daughter for placement assistance.   Expected Discharge Plan: Skilled Nursing Facility                 Expected Discharge Plan and Services                                               Social Drivers of Health (SDOH) Interventions SDOH Screenings   Food Insecurity: Patient Unable To Answer (07/11/2024)  Housing: Patient Unable To Answer (07/11/2024)  Transportation Needs: Patient Unable To Answer (07/11/2024)  Utilities: Patient Unable To Answer (07/11/2024)  Social Connections: Unknown (07/11/2024)  Tobacco Use: Low Risk  (07/10/2024)    Readmission Risk Interventions     No data to display

## 2024-07-26 NOTE — Plan of Care (Addendum)
 Pt with sitter at bedside d/t impulsivity and High fall risk. Multiple episodes during shift where pt would attempt to get out of bed and was found standing by bed as bed alarm notified staff. Although redirectable, the redirection would last for a moment and then pt would continue being non-compliant with instructions. IM haldol  and Geodon were given at intervals during the Shift PRN to address impulsivity and maintain pt safe from falling.    Problem: Education: Goal: Knowledge of General Education information will improve Description: Including pain rating scale, medication(s)/side effects and non-pharmacologic comfort measures Outcome: Progressing   Problem: Health Behavior/Discharge Planning: Goal: Ability to manage health-related needs will improve Outcome: Progressing   Problem: Clinical Measurements: Goal: Ability to maintain clinical measurements within normal limits will improve Outcome: Progressing Goal: Will remain free from infection Outcome: Progressing Goal: Diagnostic test results will improve Outcome: Progressing Goal: Respiratory complications will improve Outcome: Progressing Goal: Cardiovascular complication will be avoided Outcome: Progressing   Problem: Activity: Goal: Risk for activity intolerance will decrease Outcome: Progressing   Problem: Nutrition: Goal: Adequate nutrition will be maintained Outcome: Progressing   Problem: Coping: Goal: Level of anxiety will decrease Outcome: Progressing   Problem: Elimination: Goal: Will not experience complications related to bowel motility Outcome: Progressing Goal: Will not experience complications related to urinary retention Outcome: Progressing   Problem: Pain Managment: Goal: General experience of comfort will improve and/or be controlled Outcome: Progressing   Problem: Safety: Goal: Ability to remain free from injury will improve Outcome: Progressing   Problem: Skin Integrity: Goal: Risk for  impaired skin integrity will decrease Outcome: Progressing

## 2024-07-27 DIAGNOSIS — Z515 Encounter for palliative care: Secondary | ICD-10-CM | POA: Diagnosis not present

## 2024-07-27 DIAGNOSIS — F03918 Unspecified dementia, unspecified severity, with other behavioral disturbance: Secondary | ICD-10-CM | POA: Diagnosis not present

## 2024-07-27 MED ORDER — ACETAMINOPHEN 500 MG PO TABS
1000.0000 mg | ORAL_TABLET | Freq: Three times a day (TID) | ORAL | Status: DC
  Administered 2024-07-27 – 2024-08-03 (×18): 1000 mg via ORAL
  Filled 2024-07-27 (×17): qty 2

## 2024-07-27 NOTE — Progress Notes (Signed)
 Palliative:  HPI: 83 y.o. female with past medical history of dementia with behavioral disturbance, TIA, essential HTN, HLD, depression, left lumbar radiculopathy s/p decompression, left foot drop admitted on 07/10/2024 with concern for UTI (UA negative) and worsening agitation and dementia. Required Geodon in ED. Ongoing fluctuation of symptoms that are very unpredictable and difficult to manage.   I met today at [name]'s bedside. Sitter present. Room is dark. [name] is awake and is fidgety with blanket and sheets. She does respond appropriately when spoken too. She is pleasantly confused. Not longer combative but restless and fall risk. I discussed this with daughter [name] yesterday that fall risk will always be present. [name] is easy to speak and redirect when agitated. She continues to eat fair amounts (improved from admission). She continues to enjoy walking in the halls. She continues to be functional. No complaints of pain. Given the fact she is pleasantly confused and no longer combative I will avoid further medication adjustments other than increasing acetaminophen  to 1000 mg q8h instead of 650 mg. Awaiting placement options - recommend memory care with hospice to follow.   Exam: Alert, baseline confusion. Thin, frail. Restless in bed but not combative. Breathing regular, unlabored. Abd soft. Moves all extremities.     Plan: - DNR/DNI - Avoid escalation of care to ICU or invasive measures - Consider search for memory care placement - Agitation:             - Zyprexa 10 mg in the am and at bedtime             - Continue trazodone 100 mg at nighttime              - Continue Zoloft  200 mg daily             - Added Acetaminophen  650 mg q8h scheduled             - Added OxyIR 2.5 mg q4h PRN pain (she may have difficulty expressing pain given underlying dementia)             - Recommend out of bed to recliner or even wheelchair as she seems more agitated in bed             -  Could consider depakote sprinkles (evidence is mixed on efficacy)   25 min  [name], NP Palliative Medicine Team Pager [phone] (Please see [email or web address] for schedule) Team Phone [phone]

## 2024-07-27 NOTE — Progress Notes (Signed)
 PROGRESS NOTE    [name]  FMW:[id number] DOB: January 18, 1941 DOA: 07/10/2024 PCP: Debrah Josette ORN., PA-C   Brief Narrative:  This 83 yrs old Female with h/o progressive dementia with behavioral disturbance, TIA, HTN, HLD, depression, left lumbar radiculopathy status post decompression, chronic gait abnormality presented to the ED due to worsening confusion more than baseline with aggression / increased irritability for about 1 week. History of frequent falls from unsteady gait, recently about 4 days PTA. Patient follows with Neurology at [facility] and behavioral health, plans for patient to be referred to geriatric psychiatry was initiated. In the ED, Patient was hemodynamically stable, labs, UA, unremarkable. CT head /cervical spine with no acute intracranial abnormality, CT abdomen/pelvis with no acute findings, except for 12 mm nonobstructive renal calculus on the left, No hydronephrosis, large hiatal hernia. Daughter was concerned about UTI and also reported being unable to care for patient at home. Psychiatry consulted > increased Seroquel , added Haldol  as needed.  Assessment & Plan:   Principal Problem:   Dementia with behavioral disturbance (HCC) Active Problems:   Altered mental status   Hyperlipidemia   Left foot drop   Gait abnormality   History of dementia   History of TIA (transient ischemic attack)  Dementia with worsening behavioral disturbance: Increased agitation: Patient has been working with outpatient behavioral health, Neurology and was referred to geriatric neuropsychiatry. Inpatient psychiatric previously consulted. Continue Zyprexa 10 mg in a.m. and bedtime Continue trazodone 100 mg at bedtime. Continue Zoloft  200 mg daily. Continue Oxy IR 2.5 mg every 4 as needed for pain Continue Geodon 20 mg IM every 6 hours as needed for severe agitation and Haldol . Consider Depakote sprinkles. Continue one-to-one sitter due to agitation at night.  History of  left-sided foot drop: History of unsteady gait: History of frequent falls: CT head, CT cervical spine, CT abdomen pelvis unremarkable. Reports some chronic back pain. Scheduled Tylenol  x 2 days. Continue fall precaution PT/OT Evaluation > SNF   Essential hypertension: Continue Imdur .   Hyperlipidemia: Continue Zocor .   History of TIA: Continue aspirin  and Zocor .  DVT prophylaxis: Lovenox  Code Status: DNR Family Communication: Daughter at bedside Disposition Plan:   Status is: Inpatient Remains inpatient appropriate because: Severity of illness.  Patient needs to be off one-to-one sitter before consideration for discharge.   Consultants:  Psychiatry  Procedures: None Antimicrobials: None  Subjective: Patient was seen and examined at bedside. Overnight events noted. Patient seems improved and was sitting in the recliner, denies overnight events. Patient has difficult to manage agitation. She does have intermittent agitation spells.  Objective: Vitals:   07/26/24 1519 07/26/24 2013 07/27/24 0631 07/27/24 0813  BP: 124/78 (!) 141/93 130/62 (!) 143/117  Pulse: 84 81 (!) 56 91  Resp: 19 17 16 18   Temp:  97.8 F (36.6 C) 97.9 F (36.6 C) (!) 97 F (36.1 C)  TempSrc:   Oral   SpO2: 96% 95% 96% (!) 73%  Weight:      Height:        Intake/Output Summary (Last 24 hours) at 07/27/2024 1145 Last data filed at 07/26/2024 2200 Gross per 24 hour  Intake 120 ml  Output --  Net 120 ml    Filed Weights   07/11/24 1152  Weight: 53.5 kg    Examination:  General exam: Appears calm and comfortable, not in any acute distress. Respiratory system: CTA Bilaterally . Respiratory effort normal.  RR 15 Cardiovascular system: S1 & S2 heard, RRR. No JVD, murmurs, rubs,  gallops or clicks. Gastrointestinal system: Abdomen is non distended, soft and non tender.  Normal bowel sounds heard. Central nervous system: Alert and oriented x 2. No focal neurological  deficits. Extremities: No edema, no cyanosis, no clubbing. Skin: No rashes, lesions or ulcers Psychiatry: Judgement and insight appear normal. Mood & affect appropriate.   Data Reviewed: I have personally reviewed following labs and imaging studies  CBC: Recent Labs  Lab 07/23/24 0502  WBC 5.7  HGB 11.1*  HCT 33.2*  MCV 97.6  PLT 214    Basic Metabolic Panel: Recent Labs  Lab 07/23/24 0502  NA 139  K 3.8  CL 101  CO2 29  GLUCOSE 95  BUN 29*  CREATININE 0.88  CALCIUM  9.2  MG 2.2  PHOS 4.2    GFR: Estimated Creatinine Clearance: 41.6 mL/min (by C-G formula based on SCr of 0.88 mg/dL). Liver Function Tests: No results for input(s): AST, ALT, ALKPHOS, BILITOT, PROT, ALBUMIN in the last 168 hours. No results for input(s): LIPASE, AMYLASE in the last 168 hours. No results for input(s): AMMONIA in the last 168 hours. Coagulation Profile: No results for input(s): INR, PROTIME in the last 168 hours. Cardiac Enzymes: No results for input(s): CKTOTAL, CKMB, CKMBINDEX, TROPONINI in the last 168 hours. BNP (last 3 results) No results for input(s): PROBNP in the last 8760 hours. HbA1C: No results for input(s): HGBA1C in the last 72 hours. CBG: No results for input(s): GLUCAP in the last 168 hours. Lipid Profile: No results for input(s): CHOL, HDL, LDLCALC, TRIG, CHOLHDL, LDLDIRECT in the last 72 hours. Thyroid Function Tests: No results for input(s): TSH, T4TOTAL, FREET4, T3FREE, THYROIDAB in the last 72 hours. Anemia Panel: No results for input(s): VITAMINB12, FOLATE, FERRITIN, TIBC, IRON, RETICCTPCT in the last 72 hours. Sepsis Labs: No results for input(s): PROCALCITON, LATICACIDVEN in the last 168 hours.  No results found for this or any previous visit (from the past 240 hours).  Radiology Studies: No results found.  Scheduled Meds:  acetaminophen   1,000 mg Oral Q8H   aspirin   81 mg Oral  Daily   enoxaparin  (LOVENOX ) injection  40 mg Subcutaneous Q24H   feeding supplement  237 mL Oral BID BM   isosorbide  mononitrate  60 mg Oral Daily   levothyroxine   75 mcg Oral Q0600   lidocaine   1 patch Transdermal Q24H   OLANZapine zydis  10 mg Oral QHS   Or   OLANZapine  10 mg Intramuscular QHS   OLANZapine  10 mg Oral Daily   senna  1 tablet Oral q12n4p   sertraline   200 mg Oral Daily   simvastatin   40 mg Oral QHS   sodium chloride  flush  3 mL Intravenous Q12H   sodium chloride  flush  3 mL Intravenous Q12H   traZODone  100 mg Oral Q2000   Continuous Infusions:   LOS: 15 days    Time spent: 35 mins    Darcel Dawley, MD [facility]   If 7PM-7AM, please contact night-coverage

## 2024-07-27 NOTE — Plan of Care (Signed)

## 2024-07-28 DIAGNOSIS — F03918 Unspecified dementia, unspecified severity, with other behavioral disturbance: Secondary | ICD-10-CM | POA: Diagnosis not present

## 2024-07-28 LAB — BASIC METABOLIC PANEL WITH GFR
Anion gap: 9 (ref 5–15)
BUN: 20 mg/dL (ref 8–23)
CO2: 28 mmol/L (ref 22–32)
Calcium: 9.5 mg/dL (ref 8.9–10.3)
Chloride: 102 mmol/L (ref 98–111)
Creatinine, Ser: 0.94 mg/dL (ref 0.44–1.00)
GFR, Estimated: >60 mL/min (ref >60)
Glucose, Bld: 102 mg/dL — ABNORMAL HIGH (ref 70–99)
Potassium: 4.3 mmol/L (ref 3.5–5.1)
Sodium: 139 mmol/L (ref 135–145)

## 2024-07-28 LAB — CBC
HCT: 36.3 % (ref 36.0–46.0)
Hemoglobin: 12.1 g/dL (ref 12.0–15.0)
MCH: 32.8 pg (ref 26.0–34.0)
MCHC: 33.3 g/dL (ref 30.0–36.0)
MCV: 98.4 fL (ref 80.0–100.0)
Platelets: 192 K/uL (ref 150–400)
RBC: 3.69 MIL/uL — ABNORMAL LOW (ref 3.87–5.11)
RDW: 12.2 % (ref 11.5–15.5)
WBC: 6.8 K/uL (ref 4.0–10.5)
nRBC: 0 % (ref 0.0–0.2)

## 2024-07-28 LAB — MAGNESIUM: Magnesium: 2.1 mg/dL (ref 1.7–2.4)

## 2024-07-28 LAB — PHOSPHORUS: Phosphorus: 4.1 mg/dL (ref 2.5–4.6)

## 2024-07-28 MED ORDER — STERILE WATER FOR INJECTION IJ SOLN
INTRAMUSCULAR | Status: AC
  Filled 2024-07-28: qty 10

## 2024-07-28 NOTE — Progress Notes (Signed)
 Mobility Specialist: Progress Note   07/28/24 1541  Mobility  Activity Ambulated with assistance  Level of Assistance Contact guard assist, steadying assist  Assistive Device Front wheel walker  Distance Ambulated (ft) 400 ft  Activity Response Tolerated well  Mobility Referral No  Mobility visit 1 Mobility  Mobility Specialist Start Time (ACUTE ONLY) 0945  Mobility Specialist Stop Time (ACUTE ONLY) 0953  Mobility Specialist Time Calculation (min) (ACUTE ONLY) 8 min    Pt received in bed, pleasant confused and agreeable to mobility session. CGA throughout, mod verbal cues for forward gaze. No complaints. Returned to room. Left in BR with sitter, all needs met.  Ileana Lute Mobility Specialist Please contact via SecureChat or Rehab office at [phone]

## 2024-07-28 NOTE — Progress Notes (Signed)
 PROGRESS NOTE    [name]  FMW:[id number] DOB: December 02, 1940 DOA: 07/10/2024 PCP: Debrah Josette ORN., PA-C   Brief Narrative:  This 83 yrs old Female with h/o progressive dementia with behavioral disturbance, TIA, HTN, HLD, depression, left lumbar radiculopathy status post decompression, chronic gait abnormality presented to the ED due to worsening confusion more than baseline with aggression / increased irritability for about 1 week. History of frequent falls from unsteady gait, recently about 4 days PTA. Patient follows with Neurology at [facility] and behavioral health, plans for patient to be referred to geriatric psychiatry was initiated. In the ED, Patient was hemodynamically stable, labs, UA, unremarkable. CT head /cervical spine with no acute intracranial abnormality, CT abdomen/pelvis with no acute findings, except for 12 mm nonobstructive renal calculus on the left, No hydronephrosis, large hiatal hernia. Daughter was concerned about UTI and also reported being unable to care for patient at home. Psychiatry consulted > increased Seroquel , added Haldol  as needed.  07/28/2024: Patient seen.  No new changes.  Awaiting disposition.  Assessment & Plan:   Principal Problem:   Dementia with behavioral disturbance (HCC) Active Problems:   Hyperlipidemia   Left foot drop   Gait abnormality   Altered mental status   History of dementia   History of TIA (transient ischemic attack)  Dementia with worsening behavioral disturbance: Increased agitation: Patient has been working with outpatient behavioral health, Neurology and was referred to geriatric neuropsychiatry. Inpatient psychiatric previously consulted. Continue Zyprexa 10 mg in a.m. and bedtime Continue trazodone 100 mg at bedtime. Continue Zoloft  200 mg daily. Continue Oxy IR 2.5 mg every 4 as needed for pain Continue Geodon 20 mg IM every 6 hours as needed for severe agitation and Haldol . Consider Depakote  sprinkles. Continue one-to-one sitter due to agitation at night. 07/28/2024: Pursue disposition.  History of left-sided foot drop: History of unsteady gait: History of frequent falls: CT head, CT cervical spine, CT abdomen pelvis unremarkable. Reports some chronic back pain. Scheduled Tylenol  x 2 days. Continue fall precaution PT/OT Evaluation > SNF   Essential hypertension: Continue Imdur .   Hyperlipidemia: Continue Zocor .   History of TIA: Continue aspirin  and Zocor .  DVT prophylaxis: Lovenox  Code Status: DNR Family Communication: Daughter at bedside Disposition Plan:   Status is: Inpatient Remains inpatient appropriate because: Severity of illness.  Patient needs to be off one-to-one sitter before consideration for discharge.   Consultants:  Psychiatry  Procedures: None Antimicrobials: None  Subjective: Patient seen. No complaints.  Objective: Vitals:   07/27/24 0813 07/27/24 2030 07/28/24 0856 07/28/24 1232  BP: (!) 143/117 130/66 (!) 141/77 138/72  Pulse: 91 70 88 86  Resp: 18  20 20   Temp: (!) 97 F (36.1 C) 98.2 F (36.8 C)  98.2 F (36.8 C)  TempSrc:    Oral  SpO2: (!) 73% 96% 98% 98%  Weight:      Height:        Intake/Output Summary (Last 24 hours) at 07/28/2024 1804 Last data filed at 07/28/2024 1726 Gross per 24 hour  Intake 476 ml  Output 0 ml  Net 476 ml    Filed Weights   07/11/24 1152  Weight: 53.5 kg    Examination:  General exam: Not in any distress.  Awake and alert.[name] Respiratory system: Clear to auscultation. Cardiovascular system: S1 & S2 Gastrointestinal system: Abdomen is soft and nontender.   Central nervous system: Awake and alert.. Extremities: No leg edema.  Data Reviewed: I have personally reviewed following labs and  imaging studies  CBC: Recent Labs  Lab 07/23/24 0502 07/28/24 0415  WBC 5.7 6.8  HGB 11.1* 12.1  HCT 33.2* 36.3  MCV 97.6 98.4  PLT 214 192    Basic Metabolic Panel: Recent Labs   Lab 07/23/24 0502 07/28/24 0415  NA 139 139  K 3.8 4.3  CL 101 102  CO2 29 28  GLUCOSE 95 102*  BUN 29* 20  CREATININE 0.88 0.94  CALCIUM  9.2 9.5  MG 2.2 2.1  PHOS 4.2 4.1    GFR: Estimated Creatinine Clearance: 39 mL/min (by C-G formula based on SCr of 0.94 mg/dL). Liver Function Tests: No results for input(s): AST, ALT, ALKPHOS, BILITOT, PROT, ALBUMIN in the last 168 hours. No results for input(s): LIPASE, AMYLASE in the last 168 hours. No results for input(s): AMMONIA in the last 168 hours. Coagulation Profile: No results for input(s): INR, PROTIME in the last 168 hours. Cardiac Enzymes: No results for input(s): CKTOTAL, CKMB, CKMBINDEX, TROPONINI in the last 168 hours. BNP (last 3 results) No results for input(s): PROBNP in the last 8760 hours. HbA1C: No results for input(s): HGBA1C in the last 72 hours. CBG: No results for input(s): GLUCAP in the last 168 hours. Lipid Profile: No results for input(s): CHOL, HDL, LDLCALC, TRIG, CHOLHDL, LDLDIRECT in the last 72 hours. Thyroid Function Tests: No results for input(s): TSH, T4TOTAL, FREET4, T3FREE, THYROIDAB in the last 72 hours. Anemia Panel: No results for input(s): VITAMINB12, FOLATE, FERRITIN, TIBC, IRON, RETICCTPCT in the last 72 hours. Sepsis Labs: No results for input(s): PROCALCITON, LATICACIDVEN in the last 168 hours.  No results found for this or any previous visit (from the past 240 hours).  Radiology Studies: No results found.  Scheduled Meds:  acetaminophen   1,000 mg Oral Q8H   aspirin   81 mg Oral Daily   enoxaparin  (LOVENOX ) injection  40 mg Subcutaneous Q24H   feeding supplement  237 mL Oral BID BM   isosorbide  mononitrate  60 mg Oral Daily   levothyroxine   75 mcg Oral Q0600   lidocaine   1 patch Transdermal Q24H   OLANZapine zydis  10 mg Oral QHS   Or   OLANZapine  10 mg Intramuscular QHS   OLANZapine  10 mg Oral Daily    senna  1 tablet Oral q12n4p   sertraline   200 mg Oral Daily   simvastatin   40 mg Oral QHS   sodium chloride  flush  3 mL Intravenous Q12H   sodium chloride  flush  3 mL Intravenous Q12H   traZODone  100 mg Oral Q2000   Continuous Infusions:   LOS: 16 days    Time spent: 35 mins    [name] Chapel, MD [facility]   If 7PM-7AM, please contact night-coverage

## 2024-07-28 NOTE — Plan of Care (Signed)

## 2024-07-29 ENCOUNTER — Inpatient Hospital Stay (HOSPITAL_COMMUNITY)

## 2024-07-29 DIAGNOSIS — F03918 Unspecified dementia, unspecified severity, with other behavioral disturbance: Secondary | ICD-10-CM | POA: Diagnosis not present

## 2024-07-29 MED ORDER — STERILE WATER FOR INJECTION IJ SOLN
INTRAMUSCULAR | Status: AC
  Filled 2024-07-29: qty 10

## 2024-07-29 NOTE — Progress Notes (Signed)
 PROGRESS NOTE    [name]  FMW:[id number] DOB: Mar 20, 1941 DOA: 07/10/2024 PCP: [name]   Brief Narrative:  This 83 yrs old Female with h/o progressive dementia with behavioral disturbance, TIA, HTN, HLD, depression, left lumbar radiculopathy status post decompression, chronic gait abnormality presented to the ED due to worsening confusion more than baseline with aggression / increased irritability for about 1 week. History of frequent falls from unsteady gait, recently about 4 days PTA. Patient follows with Neurology at [facility] and behavioral health, plans for patient to be referred to geriatric psychiatry was initiated. In the ED, Patient was hemodynamically stable, labs, UA, unremarkable. CT head /cervical spine with no acute intracranial abnormality, CT abdomen/pelvis with no acute findings, except for 12 mm nonobstructive renal calculus on the left, No hydronephrosis, large hiatal hernia. Daughter was concerned about UTI and also reported being unable to care for patient at home. Psychiatry consulted > increased Seroquel , added Haldol  as needed.  07/29/2024: Patient seen.  No new changes.  Awaiting disposition.  Assessment & Plan:   Principal Problem:   Dementia with behavioral disturbance (HCC) Active Problems:   Hyperlipidemia   Left foot drop   Gait abnormality   Altered mental status   History of dementia   History of TIA (transient ischemic attack)  Dementia with worsening behavioral disturbance: Increased agitation: Patient has been working with outpatient behavioral health, Neurology and was referred to geriatric neuropsychiatry. Inpatient psychiatric previously consulted. Continue Zyprexa 10 mg in a.m. and bedtime Continue trazodone 100 mg at bedtime. Continue Zoloft  200 mg daily. Continue Oxy IR 2.5 mg every 4 as needed for pain Continue Geodon 20 mg IM every 6 hours as needed for severe agitation and Haldol . Consider Depakote  sprinkles. Continue one-to-one sitter due to agitation at night. 07/28/2024: Pursue disposition.  History of left-sided foot drop: History of unsteady gait: History of frequent falls: CT head, CT cervical spine, CT abdomen pelvis unremarkable. Reports some chronic back pain. Scheduled Tylenol  x 2 days. Continue fall precaution PT/OT Evaluation > SNF   Essential hypertension: Continue Imdur .   Hyperlipidemia: Continue Zocor .   History of TIA: Continue aspirin  and Zocor .  DVT prophylaxis: Lovenox  Code Status: DNR Family Communication: Daughter at bedside Disposition Plan:   Status is: Inpatient Remains inpatient appropriate because: Severity of illness.  Patient needs to be off one-to-one sitter before consideration for discharge.   Consultants:  Psychiatry  Procedures: None Antimicrobials: None  Subjective: Patient seen. No complaints.  Objective: Vitals:   07/28/24 0856 07/28/24 1232 07/29/24 0259 07/29/24 1154  BP: (!) 141/77 138/72 134/82 (!) 143/71  Pulse: 88 86 80 79  Resp: 20 20 16    Temp:  98.2 F (36.8 C) 97.9 F (36.6 C) 98.2 F (36.8 C)  TempSrc:  Oral Oral   SpO2: 98% 98% 97% 95%  Weight:      Height:        Intake/Output Summary (Last 24 hours) at 07/29/2024 1155 Last data filed at 07/29/2024 0940 Gross per 24 hour  Intake 476 ml  Output 0 ml  Net 476 ml    Filed Weights   07/11/24 1152  Weight: 53.5 kg    Examination:  General exam: Not in any distress.  Awake and alert.[name] Respiratory system: Clear to auscultation. Cardiovascular system: S1 & S2 Gastrointestinal system: Abdomen is soft and nontender.   Central nervous system: Awake and alert.. Extremities: No leg edema.  Data Reviewed: I have personally reviewed following labs and imaging studies  CBC: Recent Labs  Lab 07/23/24 0502 07/28/24 0415  WBC 5.7 6.8  HGB 11.1* 12.1  HCT 33.2* 36.3  MCV 97.6 98.4  PLT 214 192    Basic Metabolic Panel: Recent Labs  Lab  07/23/24 0502 07/28/24 0415  NA 139 139  K 3.8 4.3  CL 101 102  CO2 29 28  GLUCOSE 95 102*  BUN 29* 20  CREATININE 0.88 0.94  CALCIUM  9.2 9.5  MG 2.2 2.1  PHOS 4.2 4.1    GFR: Estimated Creatinine Clearance: 39 mL/min (by C-G formula based on SCr of 0.94 mg/dL). Liver Function Tests: No results for input(s): AST, ALT, ALKPHOS, BILITOT, PROT, ALBUMIN in the last 168 hours. No results for input(s): LIPASE, AMYLASE in the last 168 hours. No results for input(s): AMMONIA in the last 168 hours. Coagulation Profile: No results for input(s): INR, PROTIME in the last 168 hours. Cardiac Enzymes: No results for input(s): CKTOTAL, CKMB, CKMBINDEX, TROPONINI in the last 168 hours. BNP (last 3 results) No results for input(s): PROBNP in the last 8760 hours. HbA1C: No results for input(s): HGBA1C in the last 72 hours. CBG: No results for input(s): GLUCAP in the last 168 hours. Lipid Profile: No results for input(s): CHOL, HDL, LDLCALC, TRIG, CHOLHDL, LDLDIRECT in the last 72 hours. Thyroid Function Tests: No results for input(s): TSH, T4TOTAL, FREET4, T3FREE, THYROIDAB in the last 72 hours. Anemia Panel: No results for input(s): VITAMINB12, FOLATE, FERRITIN, TIBC, IRON, RETICCTPCT in the last 72 hours. Sepsis Labs: No results for input(s): PROCALCITON, LATICACIDVEN in the last 168 hours.  No results found for this or any previous visit (from the past 240 hours).  Radiology Studies: No results found.  Scheduled Meds:  acetaminophen   1,000 mg Oral Q8H   aspirin   81 mg Oral Daily   enoxaparin  (LOVENOX ) injection  40 mg Subcutaneous Q24H   feeding supplement  237 mL Oral BID BM   isosorbide  mononitrate  60 mg Oral Daily   levothyroxine   75 mcg Oral Q0600   lidocaine   1 patch Transdermal Q24H   OLANZapine zydis  10 mg Oral QHS   Or   OLANZapine  10 mg Intramuscular QHS   OLANZapine  10 mg Oral Daily    senna  1 tablet Oral q12n4p   sertraline   200 mg Oral Daily   simvastatin   40 mg Oral QHS   traZODone  100 mg Oral Q2000   Continuous Infusions:   LOS: 17 days    Time spent: 25 mins    Jocelyn LILLETTE Chapel, MD [facility]   If 7PM-7AM, please contact night-coverage

## 2024-07-29 NOTE — Progress Notes (Signed)
 Physical Therapy Treatment Patient Details Name: [name] MRN: [id number] DOB: 1941-09-27 Today's Date: 07/29/2024   History of Present Illness Pt is an 83 y.o. female presented 07/10/24 due to UTI symptoms and unsteadiness with repeated falls over the last few days. UA -PMH significant for CAD, HTN, dementia with behavioral disturbances, frequent UTIs, TIA, L  lumbar radiculopathy status post decompression of the left foot drop    PT Comments  Pt with forward trunk flexion to almost 90 degrees with gait. Unable to amb as far today. Long history of back pain per family. Patient will benefit from continued inpatient follow up therapy, <3 hours/day.   If plan is discharge home, recommend the following: A little help with walking and/or transfers;Assistance with cooking/housework;Assist for transportation;Help with stairs or ramp for entrance;Supervision due to cognitive status   Can travel by private vehicle     No  Equipment Recommendations  None recommended by PT    Recommendations for Other Services       Precautions / Restrictions Precautions Precautions: Fall Recall of Precautions/Restrictions: Impaired Restrictions Weight Bearing Restrictions Per Provider Order: No     Mobility  Bed Mobility Overal bed mobility: Needs Assistance Bed Mobility: Supine to Sit, Sit to Supine     Supine to sit: Min assist, HOB elevated Sit to supine: Max assist   General bed mobility comments: Assist to elevate trunk into sitting. Assist for all aspects returning to supine due to cognition    Transfers Overall transfer level: Needs assistance Equipment used: Rolling walker (2 wheels) Transfers: Sit to/from Stand Sit to Stand: Min assist           General transfer comment: Assist to power up and balance    Ambulation/Gait Ambulation/Gait assistance: Min assist Gait Distance (Feet): 150 Feet Assistive device: Rolling walker (2 wheels) Gait Pattern/deviations: Step-through  pattern, Decreased step length - right, Decreased step length - left, Shuffle, Trunk flexed Gait velocity: decreased Gait velocity interpretation: <1.31 ft/sec, indicative of household ambulator   General Gait Details: Trunk flexed forward almost 90 degrees. Would extend trunk slightly more with cues briefly but right back to flexed posture.   Stairs             Wheelchair Mobility     Tilt Bed    Modified Rankin (Stroke Patients Only)       Balance Overall balance assessment: Needs assistance Sitting-balance support: Feet supported Sitting balance-Leahy Scale: Fair     Standing balance support: Bilateral upper extremity supported Standing balance-Leahy Scale: Poor Standing balance comment: UE support and CGA for static standing                            Communication Communication Communication: Impaired Factors Affecting Communication: Reduced clarity of speech  Cognition Arousal: Alert Behavior During Therapy: Restless, Impulsive   PT - Cognitive impairments: History of cognitive impairments                         Following commands: Impaired Following commands impaired: Follows one step commands inconsistently    Cueing Cueing Techniques: Verbal cues, Gestural cues, Tactile cues  Exercises      General Comments        Pertinent Vitals/Pain Pain Assessment Pain Assessment: Faces Faces Pain Scale: Hurts little more Pain Location: back Pain Descriptors / Indicators: Grimacing Pain Intervention(s): Monitored during session, Patient requesting pain meds-RN notified (family requesting pain meds)  Home Living                          Prior Function            PT Goals (current goals can now be found in the care plan section) Acute Rehab PT Goals Patient Stated Goal: unable to state Progress towards PT goals: Not progressing toward goals - comment    Frequency    Min 1X/week      PT Plan       Co-evaluation              AM-PAC PT 6 Clicks Mobility   Outcome Measure  Help needed turning from your back to your side while in a flat bed without using bedrails?: A Little Help needed moving from lying on your back to sitting on the side of a flat bed without using bedrails?: A Little Help needed moving to and from a bed to a chair (including a wheelchair)?: A Little Help needed standing up from a chair using your arms (e.g., wheelchair or bedside chair)?: A Little Help needed to walk in hospital room?: A Little Help needed climbing 3-5 steps with a railing? : Total 6 Click Score: 16    End of Session Equipment Utilized During Treatment: Gait belt Activity Tolerance: Patient tolerated treatment well Patient left: in bed;with call bell/phone within reach;with nursing/sitter in room;with family/visitor present Nurse Communication: Mobility status;Patient requests pain meds (Family request pain meds) PT Visit Diagnosis: Unsteadiness on feet (R26.81);Other abnormalities of gait and mobility (R26.89);Muscle weakness (generalized) (M62.81);Difficulty in walking, not elsewhere classified (R26.2);Adult, failure to thrive (R62.7);Other symptoms and signs involving the nervous system (R29.898);Repeated falls (R29.6)     Time: 8384-8369 PT Time Calculation (min) (ACUTE ONLY): 15 min  Charges:    $Gait Training: 8-22 mins PT General Charges $$ ACUTE PT VISIT: 1 Visit                     [facility] PT Acute Rehabilitation Services Office [phone]    Rodgers ORN [facility] 07/29/2024, 4:43 PM

## 2024-07-29 NOTE — Plan of Care (Signed)

## 2024-07-29 NOTE — Progress Notes (Signed)
 [facility] night cross cover note:   I was contacted by the patient's RN, who conveyed new bruising involving the pt's b/l shoulders, b/l elbows, and neck, associated with some some discomfort. No report of acute neck stiffness.   Per my discussions with pt's RN, pt's last known fall occurred on 07/15/24.   I subsequently ordered plain films of the b/l shoulder, b/l elbows, and the cervical spine to further evaluate the above.      Eva Pore, DO Hospitalist

## 2024-07-30 NOTE — Progress Notes (Signed)
 PROGRESS NOTE    [name]  FMW:[id number] DOB: 1941/05/23 DOA: 07/10/2024 PCP: Debrah Josette ORN., PA-C   Brief Narrative:  This 83 yrs old Female with h/o progressive dementia with behavioral disturbance, TIA, HTN, HLD, depression, left lumbar radiculopathy status post decompression, chronic gait abnormality presented to the ED due to worsening confusion more than baseline with aggression / increased irritability for about 1 week. History of frequent falls from unsteady gait, recently about 4 days PTA. Patient follows with Neurology at [facility] and behavioral health, plans for patient to be referred to geriatric psychiatry was initiated. In the ED, Patient was hemodynamically stable, labs, UA, unremarkable. CT head /cervical spine with no acute intracranial abnormality, CT abdomen/pelvis with no acute findings, except for 12 mm nonobstructive renal calculus on the left, No hydronephrosis, large hiatal hernia. Daughter was concerned about UTI and also reported being unable to care for patient at home. Psychiatry consulted > increased Seroquel , added Haldol  as needed.  07/30/2024: Patient seen alongside patient's daughter.  No new changes.  Awaiting disposition.  Assessment & Plan:   Principal Problem:   Dementia with behavioral disturbance (HCC) Active Problems:   Hyperlipidemia   Left foot drop   Gait abnormality   Altered mental status   History of dementia   History of TIA (transient ischemic attack)  Dementia with worsening behavioral disturbance: Increased agitation: Patient has been working with outpatient behavioral health, Neurology and was referred to geriatric neuropsychiatry. Inpatient psychiatric previously consulted. Continue Zyprexa 10 mg in a.m. and bedtime Continue trazodone 100 mg at bedtime. Continue Zoloft  200 mg daily. Continue Oxy IR 2.5 mg every 4 as needed for pain Continue Geodon 20 mg IM every 6 hours as needed for severe agitation and  Haldol . Consider Depakote sprinkles. Continue one-to-one sitter due to agitation at night. 07/30/2024: Pursue disposition.  History of left-sided foot drop: History of unsteady gait: History of frequent falls: CT head, CT cervical spine, CT abdomen pelvis unremarkable. Reports some chronic back pain. Scheduled Tylenol  x 2 days. Continue fall precaution PT/OT Evaluation > SNF   Essential hypertension: Continue Imdur .   Hyperlipidemia: Continue Zocor .   History of TIA: Continue aspirin  and Zocor .  DVT prophylaxis: Lovenox  Code Status: DNR Family Communication: Daughter at bedside Disposition Plan:   Status is: Inpatient Remains inpatient appropriate because: Severity of illness.  Patient needs to be off one-to-one sitter before consideration for discharge.   Consultants:  Psychiatry  Procedures: None Antimicrobials: None  Subjective: Patient seen. No complaints.  Objective: Vitals:   07/28/24 1232 07/29/24 0259 07/29/24 1154 07/29/24 1610  BP: 138/72 134/82 (!) 143/71 114/83  Pulse: 86 80 79 86  Resp: 20 16  17   Temp: 98.2 F (36.8 C) 97.9 F (36.6 C)  97.6 F (36.4 C)  TempSrc: Oral Oral  Axillary  SpO2: 98% 97% 95% 98%  Weight:      Height:        Intake/Output Summary (Last 24 hours) at 07/30/2024 1623 Last data filed at 07/30/2024 0244 Gross per 24 hour  Intake --  Output 450 ml  Net -450 ml    Filed Weights   07/11/24 1152  Weight: 53.5 kg    Examination:  General exam: Not in any distress.  Awake and alert.[name] Respiratory system: Clear to auscultation. Cardiovascular system: S1 & S2 Gastrointestinal system: Abdomen is soft and nontender.   Central nervous system: Awake and alert.. Extremities: No leg edema.  Data Reviewed: I have personally reviewed following labs and imaging  studies  CBC: Recent Labs  Lab 07/28/24 0415  WBC 6.8  HGB 12.1  HCT 36.3  MCV 98.4  PLT 192    Basic Metabolic Panel: Recent Labs  Lab  07/28/24 0415  NA 139  K 4.3  CL 102  CO2 28  GLUCOSE 102*  BUN 20  CREATININE 0.94  CALCIUM  9.5  MG 2.1  PHOS 4.1    GFR: Estimated Creatinine Clearance: 39 mL/min (by C-G formula based on SCr of 0.94 mg/dL). Liver Function Tests: No results for input(s): AST, ALT, ALKPHOS, BILITOT, PROT, ALBUMIN in the last 168 hours. No results for input(s): LIPASE, AMYLASE in the last 168 hours. No results for input(s): AMMONIA in the last 168 hours. Coagulation Profile: No results for input(s): INR, PROTIME in the last 168 hours. Cardiac Enzymes: No results for input(s): CKTOTAL, CKMB, CKMBINDEX, TROPONINI in the last 168 hours. BNP (last 3 results) No results for input(s): PROBNP in the last 8760 hours. HbA1C: No results for input(s): HGBA1C in the last 72 hours. CBG: No results for input(s): GLUCAP in the last 168 hours. Lipid Profile: No results for input(s): CHOL, HDL, LDLCALC, TRIG, CHOLHDL, LDLDIRECT in the last 72 hours. Thyroid Function Tests: No results for input(s): TSH, T4TOTAL, FREET4, T3FREE, THYROIDAB in the last 72 hours. Anemia Panel: No results for input(s): VITAMINB12, FOLATE, FERRITIN, TIBC, IRON, RETICCTPCT in the last 72 hours. Sepsis Labs: No results for input(s): PROCALCITON, LATICACIDVEN in the last 168 hours.  No results found for this or any previous visit (from the past 240 hours).  Radiology Studies: DG Elbow 2 Views Left Result Date: 07/30/2024 EXAM: AP and lateral (2) VIEW(S) XRAY OF THE LEFT ELBOW COMPARISON: None available. CLINICAL HISTORY: Left elbow pain. FINDINGS: BONES AND JOINTS: There is osteopenia. The lateral view is not well enough positioned to assess for fat pad signs. No acute displaced fracture is seen. No focal osseous lesion. No joint dislocation. No joint effusion. SOFT TISSUES: The soft tissues are unremarkable. IMPRESSION: 1. No acute abnormality. 2. Osteopenia.  3. Unable to assess for joint effusion due to poor positioning on the lateral. Electronically signed by: Francis Quam MD 07/30/2024 05:25 AM EST RP Workstation: [id number]   DG Shoulder Right Port Result Date: 07/30/2024 EXAM: 1 VIEW XRAY OF THE RIGHT SHOULDER 07/29/2024 10:54:09 PM COMPARISON: 2-view right shoulder series 02/26/2024. CLINICAL HISTORY: FINDINGS: BONES AND JOINTS: Glenohumeral joint is normally aligned. No acute fracture or dislocation. There is osteopenia. The [facility] joint is unremarkable in appearance. There are punctate calcifications along the posterior cuff insertion, most likely due to calcific rotator cuff tendinopathy. SOFT TISSUES: Punctate calcifications are present along the posterior cuff insertion, most likely due to calcific rotator cuff tendinopathy. The soft tissues are otherwise unremarkable. Visualized lung is unremarkable. Spinal cord stimulator wiring terminates in the mid thoracic spinal canal. IMPRESSION: 1. No acute osseous abnormality. 2. 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 3. Osteopenia. Electronically signed by: Francis Quam MD 07/30/2024 05:22 AM EST RP Workstation: [id number]   DG Elbow 2 Views Right Result Date: 07/30/2024 EXAM: AP AND LATERAL (2) VIEW(S) XRAY OF THE RIGHT ELBOW COMPARISON: None available. CLINICAL HISTORY: Right elbow pain. FINDINGS: BONES AND JOINTS: Slightly positive anterior and posterior fat pad signs consistent with a joint effusion. There is mild osteopenia. No displaced fracture is seen. The joint spaces are maintained. No focal osseous lesion. No joint dislocation. Fine detail is limited on the AP, due to technique and overlying artifacts. If there is concern  for an occult fracture, consider a repeat study with 4 views and proper technique, short interval follow-up study, or CT. SOFT TISSUES: The soft tissues are unremarkable. IMPRESSION: 1. Joint effusion evidenced by slightly positive anterior and  posterior fat pad signs, which can be associated with an occult fracture. 2. No displaced fracture identified. 3. If clinical concern for occult fracture persists, recommend repeat radiographs with full 4 views, a short interval follow-up study, or CT for further evaluation. Electronically signed by: Francis Quam MD 07/30/2024 05:18 AM EST RP Workstation: [id number]   DG Cervical Spine 2 or 3 views Result Date: 07/30/2024 EXAM: 2 VIEW(S) XRAY OF THE CERVICAL SPINE 07/29/2024 10:54:09 PM COMPARISON: Cervical spine CT 02/15/2024. CLINICAL HISTORY: Neck pain. FINDINGS: BONES: Osteopenia. No acute fracture. Alignment is normal. No aggressive appearing osseous lesion. DISCS AND DEGENERATIVE CHANGES: Partial disc space loss at C6-C7. Endplate osteophytes at C6-C7. Mild multilevel facet spurring on the right greater than left. Otherwise normal disc heights. SOFT TISSUES: No prevertebral soft tissue swelling. Left greater than right calcifications of the carotid bifurcations. The patient is edentulous. The visualized apex of the lungs appear clear. IMPRESSION: 1. No acute abnormality of the cervical spine. 2. Osteopenia and degenerative with change no radiographic change compared with the prior CT. Electronically signed by: Francis Quam MD 07/30/2024 05:14 AM EST RP Workstation: [id number]   DG Shoulder Left Port Result Date: 07/30/2024 EXAM: 1 VIEW XRAY OF THE LEFT SHOULDER 07/29/2024 10:54:09 PM COMPARISON: None available. CLINICAL HISTORY: FINDINGS: BONES AND JOINTS: The glenohumeral joint is unremarkable. There is no evidence of fracture or dislocation. There is narrowing and trace spurring of the [facility] joint. SOFT TISSUES: There are calcifications in the supraspinatus tendon consistent with calcific tendinopathy. The soft tissues are otherwise unremarkable. There is spinal cord stimulator wiring terminating in the mid thoracic spinal canal. Visualized left  lung is unremarkable. The aorta is tortuous and  calcified. IMPRESSION: 1. No acute fracture or dislocation. 2. Calcific tendinopathy of the supraspinatus tendon. 3. Mild acromioclavicular joint degenerative change. 4. Aortic atherosclerosis with uncoiling. Electronically signed by: Francis Quam MD 07/30/2024 05:07 AM EST RP Workstation: [id number]    Scheduled Meds:  acetaminophen   1,000 mg Oral Q8H   aspirin   81 mg Oral Daily   enoxaparin  (LOVENOX ) injection  40 mg Subcutaneous Q24H   feeding supplement  237 mL Oral BID BM   isosorbide  mononitrate  60 mg Oral Daily   levothyroxine   75 mcg Oral Q0600   lidocaine   1 patch Transdermal Q24H   OLANZapine zydis  10 mg Oral QHS   Or   OLANZapine  10 mg Intramuscular QHS   OLANZapine  10 mg Oral Daily   senna  1 tablet Oral q12n4p   sertraline   200 mg Oral Daily   simvastatin   40 mg Oral QHS   traZODone  100 mg Oral Q2000   Continuous Infusions:   LOS: 18 days    Time spent: 25 mins    [name] Chapel, MD [facility]   If 7PM-7AM, please contact night-coverage

## 2024-07-30 NOTE — Plan of Care (Signed)

## 2024-07-31 DIAGNOSIS — Z515 Encounter for palliative care: Secondary | ICD-10-CM | POA: Diagnosis not present

## 2024-07-31 DIAGNOSIS — F03918 Unspecified dementia, unspecified severity, with other behavioral disturbance: Secondary | ICD-10-CM | POA: Diagnosis not present

## 2024-07-31 MED ORDER — STERILE WATER FOR INJECTION IJ SOLN
INTRAMUSCULAR | Status: AC
  Filled 2024-07-31: qty 10

## 2024-07-31 NOTE — TOC Progression Note (Signed)
 Transition of Care [facility]) - Progression Note    Patient Details  Name: [name] MRN: [id number] Date of Birth: 12/30/1940  [facility]) CM/SW Contact  Rosaline JONELLE Joe, RN Phone Number: 07/31/2024, 2:58 PM  Clinical Narrative:    CM spoke with Sherline, MSW with IP Care management and patient continues to need Memory Care placement.  I called and left a detailed message with Edsel Sleight, CM with [facility] to follow up regarding placement.   Expected Discharge Plan: Skilled Nursing Facility                 Expected Discharge Plan and Services                                               Social Drivers of Health (SDOH) Interventions SDOH Screenings   Food Insecurity: Patient Unable To Answer (07/11/2024)  Housing: Patient Unable To Answer (07/11/2024)  Transportation Needs: Patient Unable To Answer (07/11/2024)  Utilities: Patient Unable To Answer (07/11/2024)  Social Connections: Unknown (07/11/2024)  Tobacco Use: Low Risk  (07/10/2024)    Readmission Risk Interventions     No data to display

## 2024-07-31 NOTE — Progress Notes (Signed)
 Occupational Therapy Treatment Patient Details Name: [name] MRN: [id number] DOB: 1940/12/18 Today's Date: 07/31/2024   History of present illness Pt is an 83 y.o. female presented 07/10/24 due to UTI symptoms and unsteadiness with repeated falls over the last few days. UA -PMH significant for CAD, HTN, dementia with behavioral disturbances, frequent UTIs, TIA, L  lumbar radiculopathy status post decompression of the left foot drop   OT comments  Pt at this time was ambulating halls with NT. At this time waned to ambulate further with CGA and min assist with obstacle negotiation in halls and room. Attempted several time with attempting to improve posture as very kyphotic and unless stopping she would ambulate almost at a 90 deg angle. Also attempted to engage in standing ADLS at sink with max cues and close CGA to min assist. Patient will benefit from continued inpatient follow up therapy, <3 hours/day vs memory care with 24/7 supervision.       If plan is discharge home, recommend the following:  A little help with walking and/or transfers;A little help with bathing/dressing/bathroom;Assistance with cooking/housework;Assistance with feeding;Direct supervision/assist for medications management;Direct supervision/assist for financial management;Assist for transportation;Help with stairs or ramp for entrance;Supervision due to cognitive status   Equipment Recommendations   (RW)    Recommendations for Other Services      Precautions / Restrictions Precautions Precautions: Fall Recall of Precautions/Restrictions: Impaired Precaution/Restrictions Comments: frequent falls in days before hospitalization Restrictions Weight Bearing Restrictions Per Provider Order: No       Mobility Bed Mobility               General bed mobility comments: pt presented ambulating with staff    Transfers Overall transfer level: Needs assistance Equipment used: Rolling walker (2  wheels) Transfers: Sit to/from Stand Sit to Stand: Contact guard assist                 Balance Overall balance assessment: Needs assistance Sitting-balance support: Feet supported Sitting balance-Leahy Scale: Fair Sitting balance - Comments: very bent over to almost 90 deg angle   Standing balance support: Bilateral upper extremity supported Standing balance-Leahy Scale: Poor Standing balance comment: does better with BUE support as further decline in posture                           ADL either performed or assessed with clinical judgement   ADL Overall ADL's : Needs assistance/impaired Eating/Feeding: Set up;Sitting   Grooming: Brushing hair;Wash/dry face;Contact guard assist;Minimal assistance;Standing   Upper Body Bathing: Contact guard assist;Minimal assistance;Standing   Lower Body Bathing: Minimal assistance;Sit to/from stand   Upper Body Dressing : Minimal assistance;Sitting;Standing   Lower Body Dressing: Minimal assistance;Sit to/from stand       Toileting- Architect and Hygiene: Minimal assistance;Sit to/from stand       Functional mobility during ADLs: Contact guard assist;Minimal assistance;Rolling walker (2 wheels)      Extremity/Trunk Assessment Upper Extremity Assessment Upper Extremity Assessment: Generalized weakness   Lower Extremity Assessment Lower Extremity Assessment: Defer to PT evaluation        Vision       Perception     Praxis     Communication Communication Communication: Impaired Factors Affecting Communication: Reduced clarity of speech   Cognition Arousal: Alert Behavior During Therapy: Restless, Impulsive Cognition: History of cognitive impairments             OT - Cognition Comments: pt noted to need more  redirection in this session from lat OT visit                 Following commands: Impaired Following commands impaired: Follows one step commands inconsistently       Cueing   Cueing Techniques: Verbal cues, Gestural cues, Tactile cues  Exercises      Shoulder Instructions       General Comments      Pertinent Vitals/ Pain       Pain Assessment Pain Assessment: Faces Faces Pain Scale: Hurts a little bit Breathing: normal Negative Vocalization: none Facial Expression: smiling or inexpressive Body Language: relaxed Consolability: no need to console PAINAD Score: 0 Pain Location: pevic area Pain Descriptors / Indicators: Grimacing Pain Intervention(s): Limited activity within patient's tolerance, Monitored during session  Home Living                                          Prior Functioning/Environment              Frequency  Min 2X/week        Progress Toward Goals  OT Goals(current goals can now be found in the care plan section)  Progress towards OT goals: Progressing toward goals  Acute Rehab OT Goals Patient Stated Goal: none OT Goal Formulation: With patient Time For Goal Achievement: 08/08/24 Potential to Achieve Goals: Fair ADL Goals Pt Will Perform Upper Body Bathing: with supervision;sitting Pt Will Perform Lower Body Bathing: with supervision Pt Will Perform Upper Body Dressing: with supervision;sitting Pt Will Perform Lower Body Dressing: with supervision;sit to/from stand Pt Will Transfer to Toilet: with supervision;ambulating  Plan      Co-evaluation                 AM-PAC OT 6 Clicks Daily Activity     Outcome Measure   Help from another person eating meals?: A Little Help from another person taking care of personal grooming?: A Little Help from another person toileting, which includes using toliet, bedpan, or urinal?: A Little Help from another person bathing (including washing, rinsing, drying)?: A Little Help from another person to put on and taking off regular upper body clothing?: A Little Help from another person to put on and taking off regular lower body  clothing?: A Little 6 Click Score: 18    End of Session Equipment Utilized During Treatment: Gait belt;Rolling walker (2 wheels)  OT Visit Diagnosis: Unsteadiness on feet (R26.81);Other abnormalities of gait and mobility (R26.89);Repeated falls (R29.6);Muscle weakness (generalized) (M62.81)   Activity Tolerance Patient tolerated treatment well   Patient Left in chair;with call bell/phone within reach;with chair alarm set   Nurse Communication Mobility status        Time: 8951-8867 OT Time Calculation (min): 44 min  Charges: OT General Charges $OT Visit: 1 Visit OT Treatments $Self Care/Home Management : 38-52 mins  Warrick POUR OTR/L  Acute Rehab Services  [phone] office number   Warrick Berber 07/31/2024, 11:38 AM

## 2024-07-31 NOTE — Progress Notes (Signed)
 Pt impulsive and high fall risk. Although redirectable, was frequent with attempting at getting out of bed and was found standing by her bed many times, within minutes, as we were notified by bed alarm. Episodes occurred early and late in shift. I.M. Haldol  administered during both periods to address safety risk from frequent bed exits.
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
## 2024-08-01 DIAGNOSIS — F03918 Unspecified dementia, unspecified severity, with other behavioral disturbance: Secondary | ICD-10-CM | POA: Diagnosis not present

## 2024-08-01 MED ORDER — DICLOFENAC SODIUM 1 % EX GEL
2.0000 g | Freq: Four times a day (QID) | CUTANEOUS | Status: DC
  Administered 2024-08-01 – 2024-08-02 (×6): 2 g via TOPICAL
  Filled 2024-08-01: qty 100

## 2024-08-01 MED ORDER — STERILE WATER FOR INJECTION IJ SOLN
INTRAMUSCULAR | Status: AC
  Filled 2024-08-01: qty 10
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
## 2024-08-01 NOTE — Plan of Care (Signed)
  Problem: Education: Goal: Knowledge of General Education information will improve Description: Including pain rating scale, medication(s)/side effects and non-pharmacologic comfort measures Outcome: Progressing   Problem: Health Behavior/Discharge Planning: Goal: Ability to manage health-related needs will improve Outcome: Progressing   Problem: Nutrition: Goal: Adequate nutrition will be maintained Outcome: Progressing   Problem: Coping: Goal: Level of anxiety will decrease Outcome: Progressing   Problem: Safety: Goal: Ability to remain free from injury will improve Outcome: Progressing   Problem: Skin Integrity: Goal: Risk for impaired skin integrity will decrease Outcome: Progressing   Problem: Clinical Measurements: Goal: Ability to maintain clinical measurements within normal limits will improve Outcome: Progressing Goal: Will remain free from infection Outcome: Progressing Goal: Cardiovascular complication will be avoided Outcome: Progressing
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
## 2024-08-01 NOTE — Progress Notes (Signed)
 Physical Therapy Treatment Patient Details Name: [name] MRN: [id number] DOB: 1941-01-29 Today's Date: 08/01/2024   History of Present Illness Pt is an 83 y.o. female presented 07/10/24 due to UTI symptoms and unsteadiness with repeated falls over the last few days. UA -PMH significant for CAD, HTN, dementia with behavioral disturbances, frequent UTIs, TIA, L  lumbar radiculopathy status post decompression of the left foot drop    PT Comments  Patient recently with sedating meds per family and noted flexion and difficulty keeping head up during ambulation today.  Flexion and working her way toward foot of the bed after assisted to supine to try to find comfortable spot, though RN entering with pain meds as well.  Patient remains appropriate for inpatient rehab (<3 hours/day) at d/c.     If plan is discharge home, recommend the following: A little help with walking and/or transfers;Assistance with cooking/housework;Assist for transportation;Help with stairs or ramp for entrance;Supervision due to cognitive status   Can travel by private vehicle     No  Equipment Recommendations  None recommended by PT    Recommendations for Other Services       Precautions / Restrictions Precautions Precautions: ICD/Pacemaker Recall of Precautions/Restrictions: Impaired Precaution/Restrictions Comments: frequent falls in days before hospitalization     Mobility  Bed Mobility Overal bed mobility: Needs Assistance Bed Mobility: Supine to Sit, Sit to Supine     Supine to sit: Min assist Sit to supine: Mod assist   General bed mobility comments: HHA to lift trunk, pt already attempting to sit up, NT donned shoes, to supine assist for legs onto bed and to scoot up in bed    Transfers Overall transfer level: Needs assistance Equipment used: Rolling walker (2 wheels) Transfers: Sit to/from Stand Sit to Stand: Contact guard assist           General transfer comment: assist for balance  and safety to stand, mod A and max cues for stand to sit on EOB behind her attempting to stand back up after initial sit mod cues again and +2 A to sit on EOB then assisted to lie down    Ambulation/Gait Ambulation/Gait assistance: Min assist Gait Distance (Feet): 150 Feet Assistive device: Rolling walker (2 wheels) Gait Pattern/deviations: Decreased stride length, Shuffle, Trunk flexed       General Gait Details: cues throughout for foward gaze to decrease trunk flexion though not lasting long before flexion with elbows on walker and head almost on her L Arm   Stairs             Wheelchair Mobility     Tilt Bed    Modified Rankin (Stroke Patients Only)       Balance Overall balance assessment: Needs assistance   Sitting balance-Leahy Scale: Fair Sitting balance - Comments: flexed posture   Standing balance support: Bilateral upper extremity supported Standing balance-Leahy Scale: Poor Standing balance comment: reliant on external support for balance                            Communication Communication Communication: Impaired Factors Affecting Communication: Reduced clarity of speech  Cognition Arousal: Alert Behavior During Therapy: Restless, Impulsive   PT - Cognitive impairments: History of cognitive impairments                         Following commands: Impaired Following commands impaired: Follows one step commands inconsistently  Cueing Cueing Techniques: Verbal cues, Gestural cues, Tactile cues  Exercises      General Comments General comments (skin integrity, edema, etc.): family in the room and supportive, pt curled up in bed with head on pillows on her L and laying on her L side, though still somewhat restless, NT/sitter in room also to help make her comfortable; RN entering with medication as well      Pertinent Vitals/Pain Pain Assessment Faces Pain Scale: Hurts little more Pain Location: back Pain Descriptors /  Indicators: Discomfort, Grimacing Pain Intervention(s): Monitored during session, Limited activity within patient's tolerance, Repositioned    Home Living                          Prior Function            PT Goals (current goals can now be found in the care plan section) Progress towards PT goals: Not progressing toward goals - comment    Frequency    Min 1X/week      PT Plan      Co-evaluation              AM-PAC PT 6 Clicks Mobility   Outcome Measure  Help needed turning from your back to your side while in a flat bed without using bedrails?: A Little Help needed moving from lying on your back to sitting on the side of a flat bed without using bedrails?: A Little Help needed moving to and from a bed to a chair (including a wheelchair)?: A Little Help needed standing up from a chair using your arms (e.g., wheelchair or bedside chair)?: A Little Help needed to walk in hospital room?: A Little Help needed climbing 3-5 steps with a railing? : Total 6 Click Score: 16    End of Session Equipment Utilized During Treatment: Gait belt Activity Tolerance: Patient limited by fatigue Patient left: in bed;with family/visitor present;with nursing/sitter in room   PT Visit Diagnosis: Unsteadiness on feet (R26.81);Other abnormalities of gait and mobility (R26.89);Muscle weakness (generalized) (M62.81);Difficulty in walking, not elsewhere classified (R26.2);Adult, failure to thrive (R62.7);Other symptoms and signs involving the nervous system (R29.898);Repeated falls (R29.6)     Time: 1429-1450 PT Time Calculation (min) (ACUTE ONLY): 21 min  Charges:    $Gait Training: 8-22 mins PT General Charges $$ ACUTE PT VISIT: 1 Visit                     [name], PT Acute Rehabilitation Services [phone] 08/01/2024    [name] 08/01/2024, 5:35 PM

## 2024-08-01 NOTE — Progress Notes (Addendum)
 Pt agitated at onset of shift. Staff required to stay at bedside for pt safety- pt not redirectable and refusing to stay in bed. Scheduled medication administered with ice cream. Charge RN able to stay with pt while primary RN quickly attended other assigned pts. Notified by charge RN that pt increasingly agitated despite walking with assist in hallways and after toileting. PRN haldol  administered IM to left deltoid per order. Pt tolerated injection well. Charge RN relieved by primary RN to sit at bedside since pt still agitated and not redirectable. Pt repeatedly complaining of back pain. PRN oxycodone  administered per order for pain (pt had already received scheduled tylenol ). Pt consistently restless, repeatedly sitting up then lying down. Pt at times witnessed resting her head on railing after sitting up then lying back down. Attempted to place pillows to cushion, pt removes. NT Siris Hoos at bedside at about 21:45 to relieve primary RN and observe pt for safety. Pt still very restless. Not redirectable. Consistently attempting to leave bed, sitting up and down.   Unable to obtain vital signs. Will not remain still for BP readings via automatic machine or manual. Will not keep pulse oximetry probe in place long enough for reading to register on monitor. Will not cooperate with axillary or oral temp checks. Pt offered PO intake- ate two ice creams and drank some water.   Telesitter monitoring remains in place per order.   23:40  Pt finally asleep. Noted to have left side of her head resting on side rail. Attempted to place pillow and reposition pt. Pt removed pillow and immediately went back on left side with forehead in contact with side rail. Placed washcloth between her head and side rail. Pt removed. Letting pt rest at this time, do not want to agitate her since she is finally going to sleep. Bed alarm active on middle sensitivity setting, non skid socks in place, telesitter monitoring in use per order.  Primary RN charting on mobile computer outside pt room, when not providing care to other pts.   06:00 pt awoke and immediately began mumbling unintelligible words. Attempted to take pt to bathroom. She would sit on toilet several seconds then get back up. She did this about 5 times before saying she no longer had to go to the bathroom. Pt very restless and anxious in the bed, mumbling incessantly. Not redirectable. Attempting to calm pt but she continues to become more anxious. PRN haldol  given per order to right deltoid. Pt tolerated well. Primary RN sitting at bedside to prevent pt from falling and climbing out of bed.

## 2024-08-01 NOTE — TOC Progression Note (Signed)
 Transition of Care [facility]) - Progression Note    Patient Details  Name: [name] MRN: [id number] Date of Birth: June 08, 1941  [facility]) CM/SW Contact  Sherline Clack, [place] Phone Number: 08/01/2024, 10:50 AM  Clinical Narrative:     CSW spoke with Denice/daughter at bedside on 11/3. Denice informed CSW she has artist Antonio's phone number to call if she has questions about patient's Medicaid application. Currently, there have been no updates from financial counseling.   Edsel Sleight with [employer] will be visiting patient at bedside to discuss placement options with daughter. CSW will continue to follow and will update DC summary.   Expected Discharge Plan: Memory Care, Home with Family Barriers to Discharge: No Payor Source, No Memory Care Bed, Sitter                 Expected Discharge Plan and Services                                               Social Drivers of Health (SDOH) Interventions SDOH Screenings   Food Insecurity: Patient Unable To Answer (07/11/2024)  Housing: Patient Unable To Answer (07/11/2024)  Transportation Needs: Patient Unable To Answer (07/11/2024)  Utilities: Patient Unable To Answer (07/11/2024)  Social Connections: Unknown (07/11/2024)  Tobacco Use: Low Risk  (07/10/2024)    Readmission Risk Interventions     No data to display

## 2024-08-01 NOTE — Progress Notes (Signed)
 Occupational Therapy Treatment Patient Details Name: [name] MRN: [id number] DOB: 1941/05/11 Today's Date: 08/01/2024   History of present illness Pt is an 83 y.o. female presented 07/10/24 due to UTI symptoms and unsteadiness with repeated falls over the last few days. UA -PMH significant for CAD, HTN, dementia with behavioral disturbances, frequent UTIs, TIA, L  lumbar radiculopathy status post decompression of the left foot drop   OT comments  Patient exiting shower upon entry with staff.  OT assisted patient with dressing with min assist to donn gown, supervision for patient to donn socks and min assist for mesh underwear. Patient asking to walker and was able to ambulate in hallway with CGA with RW. Acute OT to sign off on patient due to reaching maximum potential.       If plan is discharge home, recommend the following:  A little help with walking and/or transfers;A little help with bathing/dressing/bathroom;Assistance with cooking/housework;Assistance with feeding;Direct supervision/assist for medications management;Direct supervision/assist for financial management;Assist for transportation;Help with stairs or ramp for entrance;Supervision due to cognitive status   Equipment Recommendations  Wheelchair (measurements OT)    Recommendations for Other Services      Precautions / Restrictions Precautions Precautions: Fall Recall of Precautions/Restrictions: Impaired Precaution/Restrictions Comments: frequent falls in days before hospitalization Restrictions Weight Bearing Restrictions Per Provider Order: No       Mobility Bed Mobility Overal bed mobility: Needs Assistance             General bed mobility comments: OOB upon entry    Transfers Overall transfer level: Needs assistance Equipment used: Rolling walker (2 wheels) Transfers: Sit to/from Stand Sit to Stand: Contact guard assist           General transfer comment: cues for hand placement and  safety     Balance Overall balance assessment: Needs assistance Sitting-balance support: Feet supported Sitting balance-Leahy Scale: Fair Sitting balance - Comments: flexed posture   Standing balance support: Single extremity supported, Bilateral upper extremity supported, During functional activity Standing balance-Leahy Scale: Poor Standing balance comment: reliant on external support for balance                           ADL either performed or assessed with clinical judgement   ADL Overall ADL's : Needs assistance/impaired     Grooming: Wash/dry hands;Wash/dry face;Brushing hair;Contact guard assist;Cueing for sequencing;Standing Grooming Details (indicate cue type and reason): standign at sink         Upper Body Dressing : Minimal assistance;Sitting;Standing Upper Body Dressing Details (indicate cue type and reason): to change gown Lower Body Dressing: Minimal assistance;Sit to/from stand Lower Body Dressing Details (indicate cue type and reason): patient able to donn socks, required assistance with mesh panties               General ADL Comments: Patient exiting shower upon entry and OT assisted with dressing    Extremity/Trunk Assessment              Vision       Perception     Praxis     Communication Communication Communication: Impaired Factors Affecting Communication: Reduced clarity of speech   Cognition Arousal: Alert Behavior During Therapy: Restless, Impulsive Cognition: History of cognitive impairments             OT - Cognition Comments: Pleasant but required cues for sequencing and safety  Following commands: Impaired Following commands impaired: Follows one step commands inconsistently      Cueing   Cueing Techniques: Verbal cues, Gestural cues, Tactile cues  Exercises      Shoulder Instructions       General Comments      Pertinent Vitals/ Pain       Pain Assessment Pain  Assessment: Faces Faces Pain Scale: No hurt  Home Living                                          Prior Functioning/Environment              Frequency  Min 2X/week        Progress Toward Goals  OT Goals(current goals can now be found in the care plan section)  Progress towards OT goals: Progressing toward goals  Acute Rehab OT Goals Patient Stated Goal: none OT Goal Formulation: Patient unable to participate in goal setting Time For Goal Achievement: 08/08/24 Potential to Achieve Goals: Fair ADL Goals Pt Will Perform Upper Body Bathing: with supervision;sitting Pt Will Perform Lower Body Bathing: with supervision Pt Will Perform Upper Body Dressing: with supervision;sitting Pt Will Perform Lower Body Dressing: with supervision;sit to/from stand Pt Will Transfer to Toilet: with supervision;ambulating  Plan      Co-evaluation                 AM-PAC OT 6 Clicks Daily Activity     Outcome Measure   Help from another person eating meals?: A Little Help from another person taking care of personal grooming?: A Little Help from another person toileting, which includes using toliet, bedpan, or urinal?: A Little Help from another person bathing (including washing, rinsing, drying)?: A Little Help from another person to put on and taking off regular upper body clothing?: A Little Help from another person to put on and taking off regular lower body clothing?: A Little 6 Click Score: 18    End of Session Equipment Utilized During Treatment: Gait belt;Rolling walker (2 wheels)  OT Visit Diagnosis: Unsteadiness on feet (R26.81);Other abnormalities of gait and mobility (R26.89);Repeated falls (R29.6);Muscle weakness (generalized) (M62.81)   Activity Tolerance Patient tolerated treatment well   Patient Left in chair;with call bell/phone within reach;with nursing/sitter in room   Nurse Communication Mobility status        Time: 9257-9194 OT  Time Calculation (min): 23 min  Charges: OT General Charges $OT Visit: 1 Visit OT Treatments $Self Care/Home Management : 8-22 mins $Therapeutic Activity: 8-22 mins  Dick [name], OTA Acute Rehabilitation Services  Office [phone]   [name] 08/01/2024, 12:48 PM

## 2024-08-02 DIAGNOSIS — F03918 Unspecified dementia, unspecified severity, with other behavioral disturbance: Secondary | ICD-10-CM | POA: Diagnosis not present

## 2024-08-02 MED ORDER — MELATONIN 5 MG PO TABS
5.0000 mg | ORAL_TABLET | Freq: Every day | ORAL | Status: DC
  Administered 2024-08-02: 5 mg via ORAL
  Filled 2024-08-02: qty 1

## 2024-08-02 NOTE — TOC Progression Note (Signed)
 Transition of Care [facility]) - Progression Note    Patient Details  Name: [name] MRN: [id number] Date of Birth: 08-05-1941  [facility] CM/SW Contact  Rosaline JONELLE Joe, RN Phone Number: 08/02/2024, 4:38 PM  Clinical Narrative:    CM spoke with the daughter, [name] and family is unable to pay out of pocket for memory care at this time.  The patient behaviors are improved while in the hospital and patient does not have available bed offers for SNF placement with locked facility.  The patient wanders and best option at this time would be to have the patient return home with [name] to the home with 24 hour care in the home by family.  Bernarda, NP with Palliative states that she spoke with the daughter about Hospice versus pallative care and [name] states that she would speak with her sister tonight to discuss.    Patient was recently declined for Medicaid due to assets and it might take 30-90 days to get approval for LTC Medicaid or not.  Daughter is aware.  I plan to speak with the daughters in the morning and discuss sending patient home with home hospice or home health with outpatient palliative and have MSW in the community assist with LTC Medicaid process.  Family is unable to afford memory care out of pocket per daughters.  Patient has transport chair and RW at the home at this time.  I asked that the daughter Melanie speak with me tomorrow at 10 am to discuss discharge to home this week with services.   Expected Discharge Plan: Skilled Nursing Facility                 Expected Discharge Plan and Services                                               Social Drivers of Health (SDOH) Interventions SDOH Screenings   Food Insecurity: Patient Unable To Answer (07/11/2024)  Housing: Patient Unable To Answer (07/11/2024)  Transportation Needs: Patient Unable To Answer (07/11/2024)  Utilities: Patient Unable To Answer (07/11/2024)  Social  Connections: Unknown (07/11/2024)  Tobacco Use: Low Risk  (07/10/2024)    Readmission Risk Interventions    08/02/2024    4:37 PM  Readmission Risk Prevention Plan  Transportation Screening Complete  PCP or Specialist Appt within 3-5 Days Complete  HRI or Home Care Consult Complete  Social Work Consult for Recovery Care Planning/Counseling Complete  Palliative Care Screening Complete  Medication Review [employer]) Referral to Pharmacy

## 2024-08-02 NOTE — Progress Notes (Signed)
 Pt started off today pleasant and this nurse noticed agitation from pt start to increase at 1200. Pt still difficult to redirect and has poor safety awareness. Pt refused to get vitals checked and is lying in bed restlessly. Equal breath sounds noted.

## 2024-08-02 NOTE — Plan of Care (Signed)
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
## 2024-08-02 NOTE — Plan of Care (Signed)
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
## 2024-08-02 NOTE — Progress Notes (Signed)
 Mobility Specialist: Progress Note   08/02/24 1500  Mobility  Activity Ambulated with assistance  Level of Assistance Contact guard assist, steadying assist  Assistive Device Front wheel walker  Distance Ambulated (ft) 400 ft  Activity Response Tolerated well  Mobility Referral Yes  Mobility visit 1 Mobility  Mobility Specialist Start Time (ACUTE ONLY) 1015  Mobility Specialist Stop Time (ACUTE ONLY) 1023  Mobility Specialist Time Calculation (min) (ACUTE ONLY) 8 min    Pt received ambulating in hallway with LPN. CGA throughout. Mod cues to correct posture. No complaints. Returned to room. Left in chair with all needs met, call bell in reach. Occupational psychologist in room.   Ileana Lute Mobility Specialist Please contact via SecureChat or Rehab office at [phone]

## 2024-08-03 ENCOUNTER — Other Ambulatory Visit (HOSPITAL_COMMUNITY): Payer: Self-pay

## 2024-08-03 DIAGNOSIS — F03918 Unspecified dementia, unspecified severity, with other behavioral disturbance: Secondary | ICD-10-CM | POA: Diagnosis not present

## 2024-08-03 MED ORDER — TRAZODONE HCL 100 MG PO TABS
100.0000 mg | ORAL_TABLET | Freq: Every day | ORAL | 0 refills | Status: DC
  Filled 2024-08-03: qty 30, 30d supply, fill #0

## 2024-08-03 MED ORDER — OLANZAPINE 10 MG PO TABS
10.0000 mg | ORAL_TABLET | Freq: Two times a day (BID) | ORAL | 0 refills | Status: DC
  Filled 2024-08-03: qty 30, 15d supply, fill #0

## 2024-08-03 MED ORDER — MELATONIN 5 MG PO TABS
10.0000 mg | ORAL_TABLET | Freq: Every day | ORAL | 0 refills | Status: DC
  Filled 2024-08-03: qty 60, 30d supply, fill #0

## 2024-08-03 MED ORDER — STERILE WATER FOR INJECTION IJ SOLN
INTRAMUSCULAR | Status: DC
Start: 2024-08-03 — End: 2024-08-03
  Filled 2024-08-03: qty 10
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
## 2024-08-03 NOTE — Progress Notes (Signed)
 Safety rounds performed, patient is quiet and sleeping comfortably. No issues at the moment. Will continue to monitor.

## 2024-08-03 NOTE — Progress Notes (Signed)
 Patient becoming restless. Asked the patient if she was in pain and she answered yes. Prn oxycodone  was given to the patient and heat pack was placed on her back for comfort. Patient resting comfortably after intervention. Will continue to monitor.

## 2024-08-03 NOTE — Progress Notes (Signed)
 RN discussed discharge instructions with family at bedside. TOC meds to be picked up, RN answered all questions. Pt going home with Daughter.

## 2024-08-03 NOTE — TOC Transition Note (Addendum)
 Transition of Care [facility]) - Discharge Note   Patient Details  Name: [name] MRN: [id number] Date of Birth: 11/30/1940  Transition of Care [facility]) CM/SW Contact:  Rosaline JONELLE Joe, RN Phone Number: 08/03/2024, 10:25 AM   Clinical Narrative:    CM spoke with the patient's daughters and they are agreeable to take the patient home with home hospice.  Medicare choice was offered to the daughters regarding home hospice and they chose [facility].  Referral was placed with Magdalena Berber, RNCM with [facility].    Daughters requested medications to be called into [facility] in [place].  The daughters state that they plan to take turns providing care at the home since [name] is working and [name] is retired.  The patient's family explained that they had been denied LTC Medicaid recently due to assets owned by the patient and are aware that they will need to spin down these assets to see if they will qualify.  The daughters have not looked into the DMV at this time.  The daughters state that they are unable to pay for Memory care privately either.  MSW with [facility] will follow up with the daughters in the home.  Nursing assistance was requested 2 x a week to help with baths in the home.  DME in the home includes transport WC and Rolling walker.  Patient's daughter provided with list of locked Memory Care facilities to follow up for admission at a later date once patient receives LTC Medicaid.  Patient was recently declined for Medicaid due to assets owned by the patient.  The daughters plan to follow up to have these barriers resolved if possible.  MSW with [facility] to assist.  At this time - is is unknown if patient will qualify for LTC Medicaid per daughters.  Patient's daughters plan to take the patient home by car today and provide 24 hour supervision.         Patient Goals and CMS Choice            Discharge Placement                        Discharge Plan and Services Additional resources added to the After Visit Summary for                                       Social Drivers of Health (SDOH) Interventions SDOH Screenings   Food Insecurity: Patient Unable To Answer (07/11/2024)  Housing: Patient Unable To Answer (07/11/2024)  Transportation Needs: Patient Unable To Answer (07/11/2024)  Utilities: Patient Unable To Answer (07/11/2024)  Social Connections: Unknown (07/11/2024)  Tobacco Use: Low Risk  (07/10/2024)     Readmission Risk Interventions    08/02/2024    4:37 PM  Readmission Risk Prevention Plan  Transportation Screening Complete  PCP or Specialist Appt within 3-5 Days Complete  HRI or Home Care Consult Complete  Social Work Consult for Recovery Care Planning/Counseling Complete  Palliative Care Screening Complete  Medication Review [facility]) Referral to Pharmacy

## 2024-08-07 ENCOUNTER — Telehealth: Payer: Self-pay | Admitting: Neurology

## 2024-08-07 NOTE — Telephone Encounter (Addendum)
 I called #, call dropped, when recalled LMVM that was calling back.  Pt has appt 08-21-2024 at 1130.

## 2024-08-07 NOTE — Telephone Encounter (Signed)
 Pt  daughter called to request different treatment for Pt .Pt daughter [name] states Pt is having a hard time sleeping they would like to discuss different medication  with MD

## 2024-08-07 NOTE — Telephone Encounter (Signed)
 Pt daughter called back informed of coming appt , Family is requesting for Pt to be seen sooner the patient is not sleeping

## 2024-08-08 NOTE — Telephone Encounter (Signed)
 Phone rep called pt's daughter([name]) relayed message from RN

## 2024-08-08 NOTE — Telephone Encounter (Signed)
 Message sent to phone room, advised of waitlist and contact PCP if needed before appt

## 2024-08-10 ENCOUNTER — Emergency Department (HOSPITAL_COMMUNITY)

## 2024-08-10 ENCOUNTER — Emergency Department (HOSPITAL_COMMUNITY)
Admission: EM | Admit: 2024-08-10 | Discharge: 2024-08-10 | Disposition: A | Discharge status: Home / Self Care | Attending: Emergency Medicine | Admitting: Emergency Medicine

## 2024-08-10 DIAGNOSIS — Y9301 Activity, walking, marching and hiking: Secondary | ICD-10-CM | POA: Diagnosis not present

## 2024-08-10 DIAGNOSIS — Z7982 Long term (current) use of aspirin: Secondary | ICD-10-CM | POA: Insufficient documentation

## 2024-08-10 DIAGNOSIS — Z79899 Other long term (current) drug therapy: Secondary | ICD-10-CM | POA: Insufficient documentation

## 2024-08-10 DIAGNOSIS — F0390 Unspecified dementia without behavioral disturbance: Secondary | ICD-10-CM | POA: Insufficient documentation

## 2024-08-10 DIAGNOSIS — Z8673 Personal history of transient ischemic attack (TIA), and cerebral infarction without residual deficits: Secondary | ICD-10-CM | POA: Diagnosis not present

## 2024-08-10 DIAGNOSIS — I10 Essential (primary) hypertension: Secondary | ICD-10-CM | POA: Insufficient documentation

## 2024-08-10 DIAGNOSIS — R296 Repeated falls: Secondary | ICD-10-CM | POA: Diagnosis present

## 2024-08-10 LAB — BASIC METABOLIC PANEL WITH GFR
Anion gap: 12 (ref 5–15)
BUN: 17 mg/dL (ref 8–23)
CO2: 21 mmol/L — ABNORMAL LOW (ref 22–32)
Calcium: 9 mg/dL (ref 8.9–10.3)
Chloride: 107 mmol/L (ref 98–111)
Creatinine, Ser: 0.69 mg/dL (ref 0.44–1.00)
GFR, Estimated: >60 mL/min (ref >60)
Glucose, Bld: 97 mg/dL (ref 70–99)
Potassium: 3.6 mmol/L (ref 3.5–5.1)
Sodium: 140 mmol/L (ref 135–145)

## 2024-08-10 LAB — CBC
HCT: 36.9 % (ref 36.0–46.0)
Hemoglobin: 11.9 g/dL — ABNORMAL LOW (ref 12.0–15.0)
MCH: 33.1 pg (ref 26.0–34.0)
MCHC: 32.2 g/dL (ref 30.0–36.0)
MCV: 102.8 fL — ABNORMAL HIGH (ref 80.0–100.0)
Platelets: 192 K/uL (ref 150–400)
RBC: 3.59 MIL/uL — ABNORMAL LOW (ref 3.87–5.11)
RDW: 13.4 % (ref 11.5–15.5)
WBC: 5.8 K/uL (ref 4.0–10.5)
nRBC: 0 % (ref 0.0–0.2)

## 2024-08-10 LAB — TROPONIN I (HIGH SENSITIVITY): Troponin I (High Sensitivity): 10 ng/L (ref <18)

## 2024-08-10 MED ORDER — DIAZEPAM 5 MG PO TABS: 5.0000 mg | ORAL_TABLET | Freq: Once | ORAL | 0 refills | Status: AC

## 2024-08-10 NOTE — ED Notes (Signed)
 Patient transported to X-ray

## 2024-08-10 NOTE — ED Provider Triage Note (Signed)
 Emergency Medicine Provider Triage Evaluation Note  [name] , a 83 y.o. female  was evaluated in triage.  Pt complains of frequent falls.  Patient has dementia so this is from medics.  Cannot provide further history.  Review of Systems  Positive: See above Negative: See above  Physical Exam  There were no vitals taken for this visit. Gen:   Awake, no distress   Resp:  Normal effort  MSK:   Moves extremities without difficulty  Other:    Medical Decision Making  Medically screening exam initiated at 12:48 PM.  Appropriate orders placed.  Jawanda Passey Mccollum was informed that the remainder of the evaluation will be completed by another provider, this initial triage assessment does not replace that evaluation, and the importance of remaining in the ED until their evaluation is complete.     Ula Prentice SAUNDERS, MD 08/10/24 1249

## 2024-08-10 NOTE — ED Provider Notes (Signed)
 [place] EMERGENCY DEPARTMENT AT [facility] Provider Note   CSN: [id number] Arrival date & time: 08/10/24  1225     Patient presents with: No chief complaint on file.   [name] is a 83 y.o. female.   Pt is a 83 yrs old female with h/o progressive dementia with behavioral disturbance, TIA, HTN, HLD, depression, left lumbar radiculopathy status post decompression, chronic gait abnormality here today with family members after 2 falls today.  They report she gets up and does not use her walker and then falls down.  She has not had recent fever or any other changes in her mental status.  Recently admitted for 3 weeks for medication changes due to behavioral disturbance.  She has been eating and drinking okay.  She has chronic back issues after back surgery and has a stimulator placed.  They are concerned that she is having back pain but currently patient denies any back pain or leg pain at this time.  Due to patient's dementia she does not carry on a conversation very well.  She does not take any anticoagulation.  They are not sure if she hit her head.  The history is provided by the patient.       Prior to Admission medications   Medication Sig Start Date End Date Taking? Authorizing Provider  diazepam  (VALIUM ) 5 MG tablet Take 1 tablet (5 mg total) by mouth once for 1 dose. Take before MRI 08/10/24 08/10/24 Yes Robbin Escher, Benton, MD  acetaminophen  (TYLENOL ) 500 MG tablet Take 1,000 mg by mouth in the morning and at bedtime.    [provider]  aspirin  81 MG tablet Take 81 mg by mouth in the morning.    [provider]  diclofenac  Sodium (VOLTAREN ) 1 % GEL Apply 2 g topically 4 (four) times daily. Patient taking differently: Apply 2 g topically 2 (two) times daily as needed (pain). 02/26/24   Ula Prentice SAUNDERS, MD  doxazosin  (CARDURA ) 8 MG tablet Take 8 mg by mouth daily.    [provider]  ferrous sulfate  324 MG TBEC Take 324 mg by mouth in  the morning.    [provider]  fluticasone (FLONASE) 50 MCG/ACT nasal spray Place 1 spray into both nostrils daily as needed for allergies or rhinitis.    [provider]  isosorbide  mononitrate (IMDUR ) 60 MG 24 hr tablet Take 60 mg by mouth at bedtime.    [provider]  levothyroxine  (SYNTHROID ) 75 MCG tablet Take 75 mcg by mouth in the morning. 12/18/20   [provider]  melatonin 5 MG TABS Take 2 tablets (10 mg total) by mouth at bedtime. 08/03/24   Pahwani, Ravi, MD  Multiple Vitamin (MULTIVITAMIN) tablet Take 1 tablet by mouth every morning.    [provider]  OLANZapine (ZYPREXA) 10 MG tablet Take 1 tablet (10 mg total) by mouth 2 (two) times daily. 08/03/24 09/02/24  Vernon Ranks, MD  QUEtiapine  (SEROQUEL ) 25 MG tablet TAKE 1 TABLET BY MOUTH EACH MORNING, 1 TABLET IN THE AFTERNOON , AND 3 TABLETS AT BEDTIME Patient taking differently: Take 25-75 mg by mouth 3 (three) times daily. 2, 2. 1 qhsb 04/18/24   Onita Duos, MD  sertraline  (ZOLOFT ) 100 MG tablet Take 200 mg by mouth in the morning.    [provider]  simvastatin  (ZOCOR ) 40 MG tablet Take 40 mg by mouth at bedtime. 10/31/20   [provider]  traZODone (DESYREL) 100 MG tablet Take 1 tablet (  100 mg total) by mouth daily at 8 pm. 08/03/24   Vernon Ranks, MD  trimethoprim (TRIMPEX) 100 MG tablet Take 100 mg by mouth at bedtime. 03/16/24   [provider]  vitamin B-12 (CYANOCOBALAMIN) 100 MCG tablet Take 100 mcg by mouth daily.    [provider]    Allergies: Ativan  [lorazepam ] and Nsaids    Review of Systems  Updated Vital Signs BP (!) 148/78 (BP Location: Left Arm)   Pulse 70   Temp 98 F (36.7 C)   Resp 15   SpO2 99%   Physical Exam Vitals and nursing note reviewed.  Constitutional:      General: She is not in acute distress.    Appearance: She is well-developed.  HENT:     Head: Normocephalic and atraumatic.  Eyes:      Conjunctiva/sclera: Conjunctivae normal.     Pupils: Pupils are equal, round, and reactive to light.  Cardiovascular:     Rate and Rhythm: Normal rate and regular rhythm.     Heart sounds: No murmur heard. Pulmonary:     Effort: Pulmonary effort is normal. No respiratory distress.     Breath sounds: Normal breath sounds. No wheezing or rales.  Abdominal:     General: There is no distension.     Palpations: Abdomen is soft.     Tenderness: There is no abdominal tenderness. There is no guarding or rebound.  Musculoskeletal:        General: No tenderness. Normal range of motion.     Cervical back: Normal range of motion and neck supple. No tenderness.     Right hip: Normal.     Left hip: Normal.     Right knee: Normal.     Left knee: Normal.     Comments: Multiple surgical scars are well-healed on the back and stimulator palpated all without pain.  Patient is able to stand and walk.  She is able to bend and flex at the back and does not appear to be any pain.  She denies any pain.  Skin:    General: Skin is warm and dry.     Findings: No erythema or rash.  Neurological:     Mental Status: She is alert and oriented to person, place, and time.     Comments: Patient is able to stand and walk without any help.  No focal neurodeficits at this time  Psychiatric:        Behavior: Behavior normal.     (all labs ordered are listed, but only abnormal results are displayed) Labs Reviewed  BASIC METABOLIC PANEL WITH GFR - Abnormal; Notable for the following components:      Result Value   CO2 21 (*)    All other components within normal limits  CBC - Abnormal; Notable for the following components:   RBC 3.59 (*)    Hemoglobin 11.9 (*)    MCV 102.8 (*)    All other components within normal limits  TROPONIN I (HIGH SENSITIVITY)  TROPONIN I (HIGH SENSITIVITY)    EKG: EKG Interpretation Date/Time:  Thursday August 10 2024 12:58:13 EST Ventricular Rate:  68 PR Interval:  158 QRS  Duration:  80 QT Interval:  404 QTC Calculation: 429 R Axis:   -44  Text Interpretation: Normal sinus rhythm Left axis deviation Septal infarct , age undetermined No significant change since last tracing When compared with ECG of 23-Jul-2024 09:06, PREVIOUS ECG IS PRESENT Confirmed by Doretha Folks ([id number]) on 08/10/2024 3:28:57  PM  Radiology: CT Head Wo Contrast Result Date: 08/10/2024 CLINICAL DATA:  Multiple falls over the past several days. Two falls today. EXAM: CT HEAD WITHOUT CONTRAST CT CERVICAL SPINE WITHOUT CONTRAST TECHNIQUE: Multidetector CT imaging of the head and cervical spine was performed following the standard protocol without intravenous contrast. Multiplanar CT image reconstructions of the cervical spine were also generated. RADIATION DOSE REDUCTION: This exam was performed according to the departmental dose-optimization program which includes automated exposure control, adjustment of the mA and/or kV according to patient size and/or use of iterative reconstruction technique. COMPARISON:  07/10/2024 FINDINGS: CT HEAD FINDINGS Brain: Ventricles, cisterns and other CSF spaces are normal. There is mild chronic ischemic microvascular disease present. There is no mass, mass effect, shift of midline structures or acute hemorrhage. No evidence of acute infarction. Note that the scan omits the very inferior aspect of the posterior fossa. Vascular: No hyperdense vessel or unexpected calcification. Skull: Normal. Negative for fracture or focal lesion. Sinuses/Orbits: No acute finding. Other: None. CT CERVICAL SPINE FINDINGS Alignment: Normal. Skull base and vertebrae: Mild spondylosis of the cervical spine to include uncovertebral joint spurring and facet arthropathy. Vertebral body heights are maintained. Minimal right-sided neural foraminal narrowing at the C3-4 level. Minimal bilateral neural foraminal narrowing at the C6-7 level. No acute fracture. Soft tissues and spinal canal: No  prevertebral fluid or swelling. No visible canal hematoma. Disc levels:  Moderate disc space narrowing at the C6-7 level. Upper chest: No acute findings. Other: None. IMPRESSION: 1. No acute brain injury. 2. Mild chronic ischemic microvascular disease. 3. No acute cervical spine injury. 4. Mild spondylosis of the cervical spine with moderate disc disease at the C6-7 level. Electronically Signed   By: Toribio Agreste M.D.   On: 08/10/2024 15:30   CT Cervical Spine Wo Contrast Result Date: 08/10/2024 CLINICAL DATA:  Multiple falls over the past several days. Two falls today. EXAM: CT HEAD WITHOUT CONTRAST CT CERVICAL SPINE WITHOUT CONTRAST TECHNIQUE: Multidetector CT imaging of the head and cervical spine was performed following the standard protocol without intravenous contrast. Multiplanar CT image reconstructions of the cervical spine were also generated. RADIATION DOSE REDUCTION: This exam was performed according to the departmental dose-optimization program which includes automated exposure control, adjustment of the mA and/or kV according to patient size and/or use of iterative reconstruction technique. COMPARISON:  07/10/2024 FINDINGS: CT HEAD FINDINGS Brain: Ventricles, cisterns and other CSF spaces are normal. There is mild chronic ischemic microvascular disease present. There is no mass, mass effect, shift of midline structures or acute hemorrhage. No evidence of acute infarction. Note that the scan omits the very inferior aspect of the posterior fossa. Vascular: No hyperdense vessel or unexpected calcification. Skull: Normal. Negative for fracture or focal lesion. Sinuses/Orbits: No acute finding. Other: None. CT CERVICAL SPINE FINDINGS Alignment: Normal. Skull base and vertebrae: Mild spondylosis of the cervical spine to include uncovertebral joint spurring and facet arthropathy. Vertebral body heights are maintained. Minimal right-sided neural foraminal narrowing at the C3-4 level. Minimal bilateral  neural foraminal narrowing at the C6-7 level. No acute fracture. Soft tissues and spinal canal: No prevertebral fluid or swelling. No visible canal hematoma. Disc levels:  Moderate disc space narrowing at the C6-7 level. Upper chest: No acute findings. Other: None. IMPRESSION: 1. No acute brain injury. 2. Mild chronic ischemic microvascular disease. 3. No acute cervical spine injury. 4. Mild spondylosis of the cervical spine with moderate disc disease at the C6-7 level. Electronically Signed   By: Toribio Agreste  M.D.   On: 08/10/2024 15:30   DG Chest 2 View Result Date: 08/10/2024 EXAM: 2 VIEW(S) XRAY OF THE CHEST 08/10/2024 01:25:17 PM COMPARISON: 02/26/2024 CLINICAL HISTORY: cp cp FINDINGS: LUNGS AND PLEURA: No focal pulmonary opacity. No pleural effusion. No pneumothorax. HEART AND MEDIASTINUM: Large hiatal hernia is noted. No acute abnormality of the cardiac and mediastinal silhouettes. BONES AND SOFT TISSUES: Multiple old thoracic compression fractures are noted. IMPRESSION: 1. No acute cardiopulmonary process identified. 2. Large hiatal hernia. Electronically signed by: Lynwood Seip MD 08/10/2024 02:05 PM EST RP Workstation: [id number]     Procedures   Medications Ordered in the ED - No data to display                                  Medical Decision Making Amount and/or Complexity of Data Reviewed Labs: ordered. Decision-making details documented in ED Course. Radiology: ordered and independent interpretation performed. Decision-making details documented in ED Course.  Risk Prescription drug management.   Patient presenting today after several falls.  She is awake and alert and these did not appear to be syncopal events.  Family was concerned she may have injured herself and is not sure if she had any head injuries.  She often times forgets to use her walker and when she fell today she was not using the walker.  Patient has no complaints at this time.  She is able to stand and walk at  bedside and does not appear to be in any pain.  I independently interpreted patient's labs and EKG.  EKG does not show any acute findings, CBC, BMP, troponin all without acute findings.  I have independently visualized and interpreted pt's images today.  Head CT without evidence of acute bleed and no spinal fractures.  Radiology reports mild ischemic vascular disease and disc disease but no acute findings.  Chest x-ray is within normal limits.  Discussed all this with the family who is present at patient's bedside.  At this time feel that she is stable for discharge home.  She has follow-up with neurosurgery in the future and they were requesting medication for her to take before MRI.  She was given 1 dose of Valium  as a pill to take before her MRI next week.      Final diagnoses:  Recurrent falls while walking    ED Discharge Orders          Ordered    diazepam  (VALIUM ) 5 MG tablet   Once        08/10/24 1554               Randa Riss, MD 08/10/24 2055

## 2024-08-10 NOTE — Discharge Instructions (Addendum)
 The x-rays were okay today and the blood work is reassuring.  Continue to have her walker handy and remind her to use it.  Tylenol  as needed for pain.  The prescription for the Valium  was sent to the pharmacy she can take that 30 minutes before her scan and you can let them know that she took that before the MRI that is planned next week

## 2024-08-10 NOTE — ED Triage Notes (Signed)
 Pt BIB [facility] from home for multiple falls x multiple days.  2 falls today. No obvious injuries, no hitting of head, no LOC, no thinners.  Alert with dementia.  GCS 14.   Does complain of pain to BL LE and Pain under her left breast.  148/82 99% HR 78 CBG 131

## 2024-08-13 ENCOUNTER — Other Ambulatory Visit: Payer: Self-pay | Admitting: Neurology

## 2024-08-14 ENCOUNTER — Telehealth: Payer: Self-pay | Admitting: Neurology

## 2024-08-14 DIAGNOSIS — F03918 Unspecified dementia, unspecified severity, with other behavioral disturbance: Secondary | ICD-10-CM

## 2024-08-14 NOTE — Telephone Encounter (Signed)
 Call to daughter jolene, and she reports that mom was  in the hospital for 3 weeks ( released last Wednesday ) because she having physical and mental outburst. She has been back to the ER 2x since released due falls from shaking. They (family believe this is coming from Zyprexa. She is not sleeping either. Daughter is asking for advice and medication changes/recs.

## 2024-08-14 NOTE — Telephone Encounter (Signed)
 Call to both daughters, jolene stated she was taking taking seroquel  and zyprexa but unable to confirm others. Call to denise. She states she will need to call us  back.

## 2024-08-14 NOTE — Telephone Encounter (Signed)
 Hospital discharge summary is below:  Dementia with worsening behavioral disturbance/Increased agitation: Patient was working with outpatient behavioral health, Neurology and was referred to geriatric neuropsychiatry. Inpatient psychiatric previously consulted and recommended following. Continue Zyprexa 10 mg in a.m. and bedtime Continue trazodone 100 mg at bedtime. Continue Zoloft  200 mg daily.  Is she taking Seroquel  and Zyprexa? She has an appointment scheduled next week to see Dr. Onita. Please try to verify exactly what she is taking (trazodone? Zoloft ? Melatonin? Zyprexa? Seroquel ?

## 2024-08-14 NOTE — Telephone Encounter (Signed)
 Daughter returned call and is needing a RN to call her back.

## 2024-08-14 NOTE — Telephone Encounter (Signed)
 Returned call to denise. She reports that patient is taking medication exactly as prescribed in medication list. She reports fall and shakiness and agitation. Has an upcoming appointment with Dr. Onita on 11/24. Advised I would send ot Lauraine and Dr. ONITA to review but may not make changes until evaluated in office. Daughter verbalized understanding.

## 2024-08-14 NOTE — Telephone Encounter (Signed)
 Pt's daughter reports the hospital put pt on OLANZapine (ZYPREXA) 10 MG tablet, pt is very shaky, she thinks it may be interacting with the QUEtiapine  (SEROQUEL ) 25 MG tablet , please call daughter.

## 2024-08-14 NOTE — Telephone Encounter (Signed)
 Last seen 02/15/24 Next appt 08/21/24 I do not see this med on list

## 2024-08-15 ENCOUNTER — Other Ambulatory Visit (HOSPITAL_COMMUNITY): Payer: Self-pay

## 2024-08-15 MED ORDER — ZIPRASIDONE HCL 20 MG PO CAPS: 20.0000 mg | ORAL_CAPSULE | Freq: Two times a day (BID) | ORAL | 0 refills | Status: DC

## 2024-08-15 MED ORDER — ZIPRASIDONE HCL 20 MG PO CAPS: 20.0000 mg | ORAL_CAPSULE | Freq: Every day | ORAL | 0 refills | Status: DC

## 2024-08-15 MED ORDER — QUETIAPINE FUMARATE 25 MG PO TABS: ORAL_TABLET | ORAL | 5 refills | Status: DC

## 2024-08-15 NOTE — Telephone Encounter (Signed)
 Call to all pharmacies listed and medications reconciled. Call to daughter Denies and reviewed medications. Order pended for increase in seroquel  and will confirm if Dr. Onita wants xanax added

## 2024-08-15 NOTE — Telephone Encounter (Signed)
 I Called her daughter [name] at [phone].   Patient has been extremely agitated over the past few days, was admitted to the hospital early November for similar issues  Was discharged home August 03, 2024 with Zyprexa 10 mg twice a day, Seroquel  100 mg 2 tablets in the morning, Seroquel  up to 5 tablets a day, trazodone 100 mg at nighttime  Patient remained agitated, presented to the emergency room again August 10, 2024, after fall,  She was discharged home with Valium   She continued to have agitation, pacing around her home  Daughter reported taking Geodon at hospital was helpful, Zyprexa 10 mg twice a day seems did not help her at all    We decided on following: 1.  Keep Seroquel  25 mg 1 tablet in the morning, 1 at noon, up to 4 tablets at bedtime  2.  Add on Geodon 20 mg around 6 PM 3.  Melatonin around 8 PM 5.  Stop trazodone 100 mg at night 6.  Stop Zyprexa, Ativan  as needed  Please call pharmacy to clean up her prescription meds, and also her daughter [name] at [phone]

## 2024-08-15 NOTE — Addendum Note (Signed)
 Addended by: Regino Fournet on: 08/15/2024 09:04 AM   Modules accepted: Orders

## 2024-08-15 NOTE — Addendum Note (Signed)
 Addended by: JOSHUA IZETTA CROME on: 08/15/2024 09:28 AM   Modules accepted: Orders

## 2024-08-15 NOTE — Addendum Note (Signed)
 Addended by: Mikias Lanz on: 08/15/2024 09:34 AM   Modules accepted: Orders

## 2024-08-15 NOTE — Telephone Encounter (Signed)
 Daughter called and clarified Geodon script. Pharmacy filled wrong prescription for Geodon 20 BID as new script was not there when earlier reconciliation was done. Call to pharmacy and spoke to [facility], clarified that Geodon 20 mg at bedtime is correct. Requested removal of twice daily dosing of geodon.

## 2024-08-15 NOTE — Telephone Encounter (Signed)
 Call to daughter [name]. Reviewed medication changes and she verbalized understanding

## 2024-08-15 NOTE — Telephone Encounter (Signed)
 Pt daughter  [name] called to  speak to Nurse , Informed she will get callback

## 2024-08-17 NOTE — Telephone Encounter (Signed)
 Discussed with Dr. Gregg and returned call to denise, advised Tylenol  is ok to give.

## 2024-08-17 NOTE — Telephone Encounter (Signed)
 Pt's daughter called wanting to know if it will be ok to give the pt Tylenol  along with her new medication. Please advise.

## 2024-08-21 ENCOUNTER — Encounter: Payer: Self-pay | Admitting: Neurology

## 2024-08-21 ENCOUNTER — Ambulatory Visit: Admitting: Neurology

## 2024-08-21 VITALS — BP 120/76 | HR 75 | Ht 64.0 in | Wt 110.0 lb

## 2024-08-21 DIAGNOSIS — F03918 Unspecified dementia, unspecified severity, with other behavioral disturbance: Secondary | ICD-10-CM

## 2024-08-21 DIAGNOSIS — M21372 Foot drop, left foot: Secondary | ICD-10-CM

## 2024-08-21 DIAGNOSIS — R269 Unspecified abnormalities of gait and mobility: Secondary | ICD-10-CM | POA: Diagnosis not present

## 2024-08-21 MED ORDER — DIVALPROEX SODIUM 250 MG PO DR TAB: 500.0000 mg | DELAYED_RELEASE_TABLET | Freq: Two times a day (BID) | ORAL | 11 refills | Status: DC

## 2024-08-21 NOTE — Patient Instructions (Addendum)
 weeks Zoloft  100mg  AM Depakote  DR 250mg   1st 2 1/1  2nd 1 1/2  3rd 0  2/2  4th 0/0 2/2   Go to bed at 10pm   Melatonin 5mg  2 tab 7pm, stop Geodon   Seroquel  10pm
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
## 2024-08-23 LAB — COMPREHENSIVE METABOLIC PANEL WITH GFR
ALT: 21 IU/L (ref 0–32)
AST: 26 IU/L (ref 0–40)
Albumin: 3.9 g/dL (ref 3.7–4.7)
Alkaline Phosphatase: 125 IU/L (ref 48–129)
BUN/Creatinine Ratio: 32 — AB (ref 12–28)
BUN: 20 mg/dL (ref 8–27)
Bilirubin Total: 0.3 mg/dL (ref 0.0–1.2)
CO2: 25 mmol/L (ref 20–29)
Calcium: 9.5 mg/dL (ref 8.7–10.3)
Chloride: 101 mmol/L (ref 96–106)
Creatinine, Ser: 0.63 mg/dL (ref 0.57–1.00)
Globulin, Total: 2.1 g/dL (ref 1.5–4.5)
Glucose: 91 mg/dL (ref 70–99)
Potassium: 3.9 mmol/L (ref 3.5–5.2)
Sodium: 140 mmol/L (ref 134–144)
Total Protein: 6 g/dL (ref 6.0–8.5)
eGFR: 89 mL/min/1.73 (ref >59)

## 2024-08-23 LAB — URINALYSIS, ROUTINE W REFLEX MICROSCOPIC

## 2024-08-23 LAB — CBC WITH DIFFERENTIAL/PLATELET
Basophils Absolute: 0 x10E3/uL (ref 0.0–0.2)
Basos: 0 %
EOS (ABSOLUTE): 0.1 x10E3/uL (ref 0.0–0.4)
Eos: 1 %
Hematocrit: 35.6 % (ref 34.0–46.6)
Hemoglobin: 11.4 g/dL (ref 11.1–15.9)
Immature Grans (Abs): 0 x10E3/uL (ref 0.0–0.1)
Immature Granulocytes: 0 %
Lymphocytes Absolute: 1.1 x10E3/uL (ref 0.7–3.1)
Lymphs: 17 %
MCH: 32.4 pg (ref 26.6–33.0)
MCHC: 32 g/dL (ref 31.5–35.7)
MCV: 101 fL — ABNORMAL HIGH (ref 79–97)
Monocytes Absolute: 0.4 x10E3/uL (ref 0.1–0.9)
Monocytes: 6 %
Neutrophils Absolute: 4.8 x10E3/uL (ref 1.4–7.0)
Neutrophils: 75 %
Platelets: 337 x10E3/uL (ref 150–450)
RBC: 3.52 x10E6/uL — ABNORMAL LOW (ref 3.77–5.28)
RDW: 12.8 % (ref 11.7–15.4)
WBC: 6.3 x10E3/uL (ref 3.4–10.8)

## 2024-08-23 LAB — TSH: TSH: 4.09 u[IU]/mL (ref 0.450–4.500)

## 2024-09-28 DEATH — deceased

## 2025-03-20 ENCOUNTER — Ambulatory Visit: Admitting: Neurology
